# Patient Record
Sex: Female | Born: 1945 | Race: White | Hispanic: No | Smoking: Former smoker
Health system: Southern US, Community
[De-identification: ages and names within clinical notes are randomized; demographics above are authoritative.]

## PROBLEM LIST (undated history)

## (undated) DIAGNOSIS — M5135 Other intervertebral disc degeneration, thoracolumbar region: Secondary | ICD-10-CM

## (undated) DIAGNOSIS — G8929 Other chronic pain: Secondary | ICD-10-CM

## (undated) DIAGNOSIS — E1142 Type 2 diabetes mellitus with diabetic polyneuropathy: Secondary | ICD-10-CM

## (undated) DIAGNOSIS — E119 Type 2 diabetes mellitus without complications: Secondary | ICD-10-CM

## (undated) DIAGNOSIS — M5137 Other intervertebral disc degeneration, lumbosacral region: Secondary | ICD-10-CM

## (undated) DIAGNOSIS — T8859XA Other complications of anesthesia, initial encounter: Secondary | ICD-10-CM

## (undated) DIAGNOSIS — C50919 Malignant neoplasm of unspecified site of unspecified female breast: Secondary | ICD-10-CM

## (undated) DIAGNOSIS — M545 Low back pain, unspecified: Secondary | ICD-10-CM

## (undated) DIAGNOSIS — C50911 Malignant neoplasm of unspecified site of right female breast: Secondary | ICD-10-CM

## (undated) DIAGNOSIS — E785 Hyperlipidemia, unspecified: Secondary | ICD-10-CM

## (undated) DIAGNOSIS — M51379 Other intervertebral disc degeneration, lumbosacral region without mention of lumbar back pain or lower extremity pain: Secondary | ICD-10-CM

## (undated) DIAGNOSIS — R011 Cardiac murmur, unspecified: Secondary | ICD-10-CM

## (undated) DIAGNOSIS — R51 Headache: Secondary | ICD-10-CM

## (undated) DIAGNOSIS — I340 Nonrheumatic mitral (valve) insufficiency: Secondary | ICD-10-CM

## (undated) DIAGNOSIS — M5030 Other cervical disc degeneration, unspecified cervical region: Secondary | ICD-10-CM

## (undated) DIAGNOSIS — I10 Essential (primary) hypertension: Secondary | ICD-10-CM

## (undated) DIAGNOSIS — G709 Myoneural disorder, unspecified: Secondary | ICD-10-CM

## (undated) DIAGNOSIS — M1990 Unspecified osteoarthritis, unspecified site: Secondary | ICD-10-CM

## (undated) DIAGNOSIS — R519 Headache, unspecified: Secondary | ICD-10-CM

## (undated) DIAGNOSIS — T4145XA Adverse effect of unspecified anesthetic, initial encounter: Secondary | ICD-10-CM

## (undated) HISTORY — PX: TOTAL KNEE ARTHROPLASTY: SHX125

## (undated) HISTORY — PX: JOINT REPLACEMENT: SHX530

---
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
## 1978-05-07 HISTORY — PX: TUBAL LIGATION: SHX77

## 1998-06-22 ENCOUNTER — Encounter: Admission: RE | Admit: 1998-06-22 | Discharge: 1998-07-12 | Payer: Self-pay | Admitting: Orthopedic Surgery

## 2001-12-12 ENCOUNTER — Ambulatory Visit (HOSPITAL_COMMUNITY): Admission: RE | Admit: 2001-12-12 | Discharge: 2001-12-12 | Payer: Self-pay | Admitting: Neurology

## 2001-12-12 ENCOUNTER — Encounter: Payer: Self-pay | Admitting: Neurology

## 2005-11-15 ENCOUNTER — Emergency Department (HOSPITAL_COMMUNITY): Admission: EM | Admit: 2005-11-15 | Discharge: 2005-11-16 | Payer: Self-pay | Admitting: Emergency Medicine

## 2008-02-09 ENCOUNTER — Inpatient Hospital Stay (HOSPITAL_COMMUNITY): Admission: RE | Admit: 2008-02-09 | Discharge: 2008-02-11 | Payer: Self-pay | Admitting: Orthopedic Surgery

## 2008-03-03 ENCOUNTER — Encounter: Admission: RE | Admit: 2008-03-03 | Discharge: 2008-05-04 | Payer: Self-pay | Admitting: Orthopedic Surgery

## 2010-08-01 ENCOUNTER — Inpatient Hospital Stay (HOSPITAL_COMMUNITY)
Admission: EM | Admit: 2010-08-01 | Discharge: 2010-08-03 | DRG: 314 | Disposition: A | Discharge status: Home / Self Care | Payer: 59 | Attending: Pulmonary Disease | Admitting: Pulmonary Disease

## 2010-08-01 ENCOUNTER — Emergency Department (HOSPITAL_COMMUNITY): Payer: 59

## 2010-08-01 DIAGNOSIS — R197 Diarrhea, unspecified: Secondary | ICD-10-CM

## 2010-08-01 DIAGNOSIS — A059 Bacterial foodborne intoxication, unspecified: Secondary | ICD-10-CM

## 2010-08-01 DIAGNOSIS — R579 Shock, unspecified: Secondary | ICD-10-CM

## 2010-08-01 DIAGNOSIS — R578 Other shock: Secondary | ICD-10-CM | POA: Diagnosis present

## 2010-08-01 DIAGNOSIS — R112 Nausea with vomiting, unspecified: Secondary | ICD-10-CM | POA: Diagnosis present

## 2010-08-01 DIAGNOSIS — I959 Hypotension, unspecified: Principal | ICD-10-CM | POA: Diagnosis present

## 2010-08-01 DIAGNOSIS — E785 Hyperlipidemia, unspecified: Secondary | ICD-10-CM | POA: Diagnosis present

## 2010-08-01 DIAGNOSIS — E119 Type 2 diabetes mellitus without complications: Secondary | ICD-10-CM | POA: Diagnosis present

## 2010-08-01 LAB — BASIC METABOLIC PANEL
BUN: 20 mg/dL (ref 6–23)
Calcium: 8.7 mg/dL (ref 8.4–10.5)
Creatinine, Ser: 1.19 mg/dL (ref 0.4–1.2)
GFR calc non Af Amer: 46 mL/min — ABNORMAL LOW (ref >60)
Glucose, Bld: 129 mg/dL — ABNORMAL HIGH (ref 70–99)
Potassium: 4.5 mEq/L (ref 3.5–5.1)

## 2010-08-01 LAB — PROTIME-INR: INR: 0.94 (ref 0.00–1.49)

## 2010-08-01 LAB — URINALYSIS, MICROSCOPIC ONLY
Glucose, UA: NEGATIVE mg/dL
Ketones, ur: NEGATIVE mg/dL
Leukocytes, UA: NEGATIVE
Nitrite: NEGATIVE
Specific Gravity, Urine: 1.018 (ref 1.005–1.030)
pH: 5 (ref 5.0–8.0)

## 2010-08-01 LAB — COMPREHENSIVE METABOLIC PANEL
AST: 27 U/L (ref 0–37)
Albumin: 4.1 g/dL (ref 3.5–5.2)
Alkaline Phosphatase: 79 U/L (ref 39–117)
CO2: 21 mEq/L (ref 19–32)
Chloride: 107 mEq/L (ref 96–112)
Creatinine, Ser: 1.33 mg/dL — ABNORMAL HIGH (ref 0.4–1.2)
GFR calc Af Amer: 49 mL/min — ABNORMAL LOW (ref >60)
GFR calc non Af Amer: 40 mL/min — ABNORMAL LOW (ref >60)
Glucose, Bld: 143 mg/dL — ABNORMAL HIGH (ref 70–99)
Potassium: 4.6 mEq/L (ref 3.5–5.1)
Sodium: 140 mEq/L (ref 135–145)
Total Bilirubin: 0.3 mg/dL (ref 0.3–1.2)
Total Protein: 7.3 g/dL (ref 6.0–8.3)

## 2010-08-01 LAB — CBC
HCT: 41.9 % (ref 36.0–46.0)
MCH: 27 pg (ref 26.0–34.0)
MCHC: 32.5 g/dL (ref 30.0–36.0)
Platelets: 271 10*3/uL (ref 150–400)
RBC: 5.03 MIL/uL (ref 3.87–5.11)
RDW: 15.4 % (ref 11.5–15.5)
WBC: 13.6 10*3/uL — ABNORMAL HIGH (ref 4.0–10.5)

## 2010-08-01 LAB — DIFFERENTIAL
Basophils Relative: 0 % (ref 0–1)
Eosinophils Absolute: 0.2 10*3/uL (ref 0.0–0.7)
Eosinophils Relative: 1 % (ref 0–5)
Lymphocytes Relative: 6 % — ABNORMAL LOW (ref 12–46)
Monocytes Absolute: 1.8 10*3/uL — ABNORMAL HIGH (ref 0.1–1.0)
Monocytes Relative: 13 % — ABNORMAL HIGH (ref 3–12)
Neutrophils Relative %: 80 % — ABNORMAL HIGH (ref 43–77)

## 2010-08-01 LAB — BRAIN NATRIURETIC PEPTIDE: Pro B Natriuretic peptide (BNP): 46 pg/mL (ref 0.0–100.0)

## 2010-08-01 LAB — HEPATIC FUNCTION PANEL
ALT: 20 U/L (ref 0–35)
AST: 26 U/L (ref 0–37)
Bilirubin, Direct: <0.1 mg/dL (ref 0.0–0.3)
Total Bilirubin: 0.4 mg/dL (ref 0.3–1.2)

## 2010-08-01 LAB — GLUCOSE, CAPILLARY: Glucose-Capillary: 155 mg/dL — ABNORMAL HIGH (ref 70–99)

## 2010-08-01 LAB — CK TOTAL AND CKMB (NOT AT ARMC): Total CK: 392 U/L — ABNORMAL HIGH (ref 7–177)

## 2010-08-01 LAB — LIPASE, BLOOD: Lipase: 22 U/L (ref 11–59)

## 2010-08-02 LAB — CARDIAC PANEL(CRET KIN+CKTOT+MB+TROPI)
Relative Index: 1.8 (ref 0.0–2.5)
Total CK: 313 U/L — ABNORMAL HIGH (ref 7–177)
Troponin I: 0.01 ng/mL (ref 0.00–0.06)

## 2010-08-02 LAB — GLUCOSE, CAPILLARY
Glucose-Capillary: 153 mg/dL — ABNORMAL HIGH (ref 70–99)
Glucose-Capillary: 178 mg/dL — ABNORMAL HIGH (ref 70–99)
Glucose-Capillary: 162 mg/dL — ABNORMAL HIGH (ref 70–99)

## 2010-08-02 LAB — CBC
Hemoglobin: 11.4 g/dL — ABNORMAL LOW (ref 12.0–15.0)
MCV: 83.2 fL (ref 78.0–100.0)
Platelets: 268 10*3/uL (ref 150–400)
RBC: 4.22 MIL/uL (ref 3.87–5.11)
WBC: 8.9 10*3/uL (ref 4.0–10.5)

## 2010-08-02 LAB — URINE CULTURE
Culture  Setup Time: 201203272150
Culture: NO GROWTH
Special Requests: NEGATIVE

## 2010-08-02 LAB — BASIC METABOLIC PANEL
BUN: 14 mg/dL (ref 6–23)
CO2: 25 mEq/L (ref 19–32)
Chloride: 110 mEq/L (ref 96–112)
Potassium: 4 mEq/L (ref 3.5–5.1)

## 2010-08-02 LAB — CLOSTRIDIUM DIFFICILE BY PCR: Toxigenic C. Difficile by PCR: NEGATIVE

## 2010-08-02 LAB — GIARDIA/CRYPTOSPORIDIUM SCREEN(EIA)
Cryptosporidium Screen (EIA): NEGATIVE
Giardia Screen - EIA: NEGATIVE

## 2010-08-03 LAB — BASIC METABOLIC PANEL
BUN: 11 mg/dL (ref 6–23)
CO2: 25 mEq/L (ref 19–32)
Chloride: 107 mEq/L (ref 96–112)
Glucose, Bld: 162 mg/dL — ABNORMAL HIGH (ref 70–99)
Potassium: 4 mEq/L (ref 3.5–5.1)
Sodium: 141 mEq/L (ref 135–145)

## 2010-08-03 LAB — CBC
HCT: 34.1 % — ABNORMAL LOW (ref 36.0–46.0)
Hemoglobin: 10.8 g/dL — ABNORMAL LOW (ref 12.0–15.0)
MCV: 83.8 fL (ref 78.0–100.0)
WBC: 6.3 10*3/uL (ref 4.0–10.5)

## 2010-08-03 LAB — GLUCOSE, CAPILLARY: Glucose-Capillary: 173 mg/dL — ABNORMAL HIGH (ref 70–99)

## 2010-08-05 LAB — STOOL CULTURE

## 2010-08-08 LAB — CULTURE, BLOOD (ROUTINE X 2)
Culture  Setup Time: 201203280111
Culture: NO GROWTH

## 2010-08-28 NOTE — H&P (Signed)
NAME:  Tonya Alvarez, Tonya Alvarez NO.:  [id number]  MEDICAL RECORD NO.:  [id number]           PATIENT TYPE:  I  LOCATION:  2901                         FACILITY:  [facility]  PHYSICIAN:  Felipa Evener, MD  DATE OF BIRTH:  04-14-1946  DATE OF ADMISSION:  08/01/2010 DATE OF DISCHARGE:                             HISTORY & PHYSICAL   IDENTIFICATION:  A 65 year old female with past medical history significant for well-controlled diabetes with metformin and insulin who presents to the hospital with a chief complaint of nausea and vomiting. The patient was working in the intensive care unit and after eating, preordered [place] food, the patient reported nausea, vomiting, approximately 1 hour after eating that progressed to diarrhea and then the patient was dizzy.  She was asked to sit down and blood pressure was checked at that time that showed a blood pressure systolic of 70 with a heart rate of 80-90.  The patient was asked to lay down and was given 1 L of normal saline with blood pressures stabilizing in the 50s.  In the emergency department, the patient was given additional IV fluids with a plan for control of nausea and vomiting.  Pulmonary Critical Care was called to admit since the patient is hypotensive.  Upon evaluating the patient, the patient received a total of 1-1/2 L of normal saline. __________ remained in the 50s with no fevers or chills, but did report nausea, vomiting, and diarrhea, none of which were bloody or melanous. The patient is able to depress her airway well and was requiring minimal amount of oxygen.  Denied any abdominal pain, fever, chills, chest pains, or any additional symptoms.  Her past medical history is significant for non-insulin-dependent diabetes x8 years, has been using metformin and insulin, metformin 1000 p.o. b.i.d. and insulin 80/20, 60 units in the morning, 70 units at night, 81 mg aspirin daily, MDI daily, vitamin D daily, and Benicar  for which the patient does not recall the dose.  ALLERGIES:  ACE INHIBITORS resulting in cough.  SOCIAL HISTORY:  One-half pack per day smoker for past 40 years.  No alcohol.  No drug abuse.  She works as Sales executive.  HISTORY OF PRESENT ILLNESS:  The patient has had similar symptoms approximately 3 weeks ago after eating out.  PAST SURGICAL HISTORY:  Left knee replacement 2-1/2 years ago, tubal ligation, and tonsillectomy as a child.  REVIEW OF SYSTEMS:  Twelve-point review of systems is negative other than mentioned above.  PHYSICAL EXAMINATION:  GENERAL:  A well-appearing obese female resting comfortably in bed, in no acute distress. VITAL SIGNS:  She is afebrile.  Temperature 98.3, heart rate of 82, respiratory rate of 18, blood pressure of 90/40, and saturation of 100% of 3 L via nasal cannula. HEENT:  Normocephalic, atraumatic.  Pupils are equal, round, and reactive to light.  Extraocular movements are intact.  Oronasal mucosa is within normal limits. NECK:  No thyromegaly, lymphadenopathy, or jugular venous reflux appreciated.  However, the neck is obese and it is difficult to ascertain vessels. HEART:  Regular rate and rhythm.  Normal S1 and S2.  No murmurs, rubs, or gallops are appreciated. LUNGS:  Clear to auscultation bilaterally. ABDOMEN:  Soft, nontender, and nondistended.  Positive bowel sounds. EXTREMITIES:  No edema.  No tenderness appreciated. NEUROLOGIC:  Grossly intact.  LABORATORY STUDIES:  All reviewed.  However, only a CBC was available at this time with a white blood cell count of 13.6, hemoglobin of 13.6, hematocrit 41.9, platelet count of 271.  ASSESSMENT AND PLAN:  The patient is a 65 year old female with a past medical history significant for type 2 diabetes that is well-controlled with the last hemoglobin A1c of 6.8.  The patient is presenting with shock, nausea, vomiting, and diarrhea indicating mostly it is hypovolemic or septic  shock though only unusual part is that the patient is not tachycardic.  She is not on any beta-blocker or calcium channel blocker or any medication for rate control pointing the concern for cardiogenic shock as a possible culprit as well.  Therefore, at this point, we will admit the patient to step-down unit.  We will start her on aspirin, hold any beta-blockers.  Her EKG will be checked.  Cardiac enzymes will also be checked.  With regards to diarrhea, nausea, vomiting, we will use Zofran on a p.r.n. basis.  Hold Imodium given the fact that the patient might have infective rather than toxic diarrhea. We will panculture the patient, IV fluid will be ordered.  EKG with cardiac enzymes for question of cardiogenic shock will be ordered. Cipro and Flagyl IV per pharmacy.  We will allow for clear liquid diet as long as nausea and vomiting is under control, we will admit the patient to step-down unit.  All labs are still pending at this point. Therefore, we will follow up on labs.  We will titrate O2 for saturation of 88-92% and we will follow upon arrival to the step-down unit.     Felipa Evener, MD     WJY/MEDQ  D:  08/01/2010  T:  08/02/2010  Job:  [id number]  Electronically Signed by Koren Bound MD on 08/28/2010 10:39:54 AM

## 2010-08-28 NOTE — Discharge Summary (Signed)
NAMESEDONA, Tonya Alvarez                ACCOUNT NO.:  [id number]  MEDICAL RECORD NO.:  [id number]           PATIENT TYPE:  I  LOCATION:  2901                         FACILITY:  [facility]  PHYSICIAN:  Felipa Evener, MD  DATE OF BIRTH:  [birth year]  DATE OF ADMISSION:  08/01/2010 DATE OF DISCHARGE:  08/03/2010                              DISCHARGE SUMMARY   DISCHARGE DIAGNOSES: 1. Hypotension/hypovolemic shock. 2. Acute renal insufficiency. 3. Nausea, vomiting, diarrhea. 4. Diabetes.  HISTORY OF PRESENT ILLNESS:  Ms. 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  The patient felt dizzy and ultimately had a syncopal episode while at work, and was found to have a blood pressure of 70 systolic and Pulmonary Critical Care was called to admit the patient secondary to hypotension and hypovolemic shock.  LABORATORY DATA:  At the time of admission on March 27, CBC, white blood cells 13.6, hemoglobin 13.6, hematocrit 41.9, and platelets 271. Complete metabolic panel, sodium 140, potassium 4.6, glucose 143, BUN 19, creatinine 1.33.  Lactic acid 1.6, INR 0.94, and troponin 0.01.  BNP 46.  On March 28, basic metabolic panel, sodium 141, potassium 4.0, glucose of 127, BUN 14, creatinine 0.82.  Most recent laboratory data on March 29, basic metabolic panel, sodium 141, potassium 4.0, glucose 162, BUN 11, creatinine 0.71.  CBC, white blood cells 6.3, hemoglobin 10.8, hematocrit 34.1, and platelets 229.  MICRO DATA:  Blood cultures x2 on March 27, preliminary report is no growth.  Urine culture on March 27, final report no growth.  Stool culture on March 27, preliminary report is most suspicious colonies and C.diff PCR on March 7 was negative.  RADIOLOGY DATA:  Portable chest x-ray on March 27 shows no  acute cardiopulmonary process.  HOSPITAL COURSE BY DISCHARGE DIAGNOSES: 1. Hypotension/hypovolemic shock secondary to nausea, vomiting,     diarrhea, questionable food positioning after eating and pick out     [place] food.  The patient was initially significantly hypotensive     with systolic blood pressure in the 60s and 70s, however, with     aggressive volume resuscitation her blood pressures returned to     normal.  At the time of discharge, the patient no longer receiving     IV fluids.  Her blood pressures are within normal limits and the     patient is tolerating regular diet. 2. Acute renal insufficiency.  This in the setting of hypokalemia,     peak creatinine 1.19.  The patient is on an ARB at baseline.  This     was held and with aggressive IV fluid resuscitation, the patient's     renal function has returned to normal.  She will be restarted on     her regular home medications at discharge. 3. Nausea, vomiting, and diarrhea.  All cultures were negative.     Question whether this was an acute food positioning.  At the time  of discharge, her nausea, vomiting and diarrhea are resolved.  She     has been tolerating a p.o. diet without any difficulties and has     not needed any antiemetics.  She will be discharged with p.r.n.     Zofran as needed. 4. Diabetes.  The patient was treated with ICU hyperglycemia protocol     while she was inpatient.  She will be discharged on her home     insulin and metformin regimen.  DISCHARGE MEDICATIONS: 1. Zofran 4 mg p.o. q.6 h. p.r.n. for nausea. 2. Benicar 10 mg p.o. daily. 3. Humalog 80/20 60 units subcu in the morning and 70 units subqu at     night.  This was the patient's previous home regimen. 4. Ibuprofen 200 mg p.o. q.6 h. p.r.n. for pain. 5. Metformin XR 1000 mg p.o. b.i.d. 6. Multivitamins 1 tablet daily 7. Vitamin D 1 tablet p.o. daily.  DISCHARGE ACTIVITY:  No restrictions.  DISCHARGE DIET:  The patient is discharged  on a diabetic diet.  DISCHARGE FOLLOWUP:  The patient is to follow up with her primary care/endocrinologist, Dr. Talmage Nap, [facility] as previously scheduled on April 9 at 8:15  DISPOSITION:  The patient has received maximum benefit from her inpatient hospitalization.  Her hypovolemic shock quickly resolved with IV fluids. She has had no further nausea, vomiting, and diarrhea and will be discharged home on her regular medication regimen.     Dirk Dress, NP   ______________________________ Felipa Evener, MD    KW/MEDQ  D:  08/03/2010  T:  08/04/2010  Job:  [id number]  cc:   Dorisann Frames, M.D.  Electronically Signed by Danford Bad N.P. on 08/07/2010 11:20:56 AM Electronically Signed by Koren Bound MD on 08/28/2010 10:39:50 AM

## 2010-09-08 ENCOUNTER — Emergency Department (HOSPITAL_COMMUNITY)
Admission: EM | Admit: 2010-09-08 | Discharge: 2010-09-08 | Disposition: A | Discharge status: Home / Self Care | Payer: 59 | Attending: Emergency Medicine | Admitting: Emergency Medicine

## 2010-09-08 ENCOUNTER — Emergency Department (HOSPITAL_COMMUNITY): Payer: 59

## 2010-09-08 DIAGNOSIS — E119 Type 2 diabetes mellitus without complications: Secondary | ICD-10-CM | POA: Insufficient documentation

## 2010-09-08 DIAGNOSIS — W010XXA Fall on same level from slipping, tripping and stumbling without subsequent striking against object, initial encounter: Secondary | ICD-10-CM | POA: Insufficient documentation

## 2010-09-08 DIAGNOSIS — Z794 Long term (current) use of insulin: Secondary | ICD-10-CM | POA: Insufficient documentation

## 2010-09-08 DIAGNOSIS — S53106A Unspecified dislocation of unspecified ulnohumeral joint, initial encounter: Secondary | ICD-10-CM | POA: Insufficient documentation

## 2010-09-08 DIAGNOSIS — S8000XA Contusion of unspecified knee, initial encounter: Secondary | ICD-10-CM | POA: Insufficient documentation

## 2010-09-08 DIAGNOSIS — Y92009 Unspecified place in unspecified non-institutional (private) residence as the place of occurrence of the external cause: Secondary | ICD-10-CM | POA: Insufficient documentation

## 2010-09-19 NOTE — Op Note (Signed)
Tonya Alvarez NO.:  [id number]   MEDICAL RECORD NO.:  [id number]          PATIENT TYPE:  INP   LOCATION:  2899                         FACILITY:  [facility]   PHYSICIAN:  Elana Alm. Thurston Hole, M.D. DATE OF BIRTH:  [birth year]   DATE OF PROCEDURE:  02/09/2008  DATE OF DISCHARGE:                               OPERATIVE REPORT   PREOPERATIVE DIAGNOSIS:  Left knee degenerative joint disease.   POSTOPERATIVE DIAGNOSIS:  Left knee degenerative joint disease.   PROCEDURE:  Left total knee replacement using DePuy cemented total knee  system with #3 cemented femur, #3 cemented tibia with 15-mm polyethylene  RP tibial spacer and 35-mm polyethylene cemented patella.   SURGEON:  Elana Alm. Thurston Hole, MD   ASSISTANT:  Julien Girt, PA   ANESTHESIA:  General.   OPERATIVE TIME:  1 hour 30 minutes.   COMPLICATIONS:  None.   DESCRIPTION OF PROCEDURE:  [name] was brought to the operating room  on February 09, 2008, after a femoral nerve block was placed on him by  Anesthesia.  She received Ancef 2 grams IV preoperatively for  prophylaxis.  After being placed under general anesthesia, she had a  Foley catheter placed under sterile conditions.  Her left knee was  examined.  The range of motion from -5 to 125 degrees, moderate varus  deformity, knee stable with ligamentous exam with normal patellar  tracking.  Left leg was prepped using sterile DuraPrep and draped using  sterile technique.  Leg was exsanguinated and a thigh tourniquet  elevated 375 mm.  Initially through a 15 cm longitudinal incision based  over the patella, an initial exposure was made.  The underlying  subcutaneous tissues were incised along with skin incision.  A median  arthrotomy was performed revealing excessive amount of normal-appearing  joint fluid.  The articular surfaces were inspected.  She had grade 4  changes medially, grade 3 and 4 changes laterally, and grade 3 and 4  changes in the  patellofemoral joint.  Osteophytes removed from the  femoral condyles and tibial plateau.  The medial and lateral meniscal  remnants were removed as well as the anterior cruciate ligament.  An  intramedullary drill was then drilled up the femoral canal for placement  of distal femoral cutting jig and it was placed in the appropriate  manner of rotation and a distal 14 mm cut was made.  The distal femur  was incised.  The #3 was found be the appropriate size.  The #3 cutting  jig was placed in the appropriate manner of external rotation and then  these cuts were made.  At this point, the proximal tibia was exposed.  The tibial spines were removed with an oscillating saw.  An  intramedullary drill was drilled down the tibial canal for placement of  proximal tibial cutting jig, which was placed in the appropriate  rotation and a proximal 6-mm cut was made based off the medial or lower  side.  Spacer blocks were then placed in flexion and extension.  The  12.5 mm blocks gave excellent balancing, excellent stability and  excellent correction of her flexion and varus deformities.  At this  point, the #3 tibial base plate trial was placed on the cut tibial  surface with an excellent fit and the keel cut was made.  Then #3 PCL  box cutter was then placed on the distal femur and these cuts were made.  After this was done, a #3 femoral trial was placed with the #3 tibial  base plate trial and a 15-mm polyethylene RP tibial spacer, knee was  reduced, taken through a range of motion from 0 to 125 degrees with  excellent stability and excellent correction of flexion and varus  deformities, and normal patellar tracking.  Resurfacing 9 mm cut was  then made on the patella and 3 locking holes placed for a 35-mm patella  trial.  The trial was placed and again patellofemoral tracking was  evaluated and found to be normal.  At this point, it was felt that all  the trial components were of excellent size,  fit, and stability.  They  were then removed.  The knee was then jet lavage irrigated with 3 liters  of saline.  The proximal tibia was then exposed and the #3 tibial base  plate with cement backing was hammered into position with an excellent  fit with excess cement being removed from around the edges.  The #3  femoral component with cement backing was hammered in to position also  with an excellent fit with excess cement being removed from around the  edges.  The 15-mm polyethylene RP tibial spacer was then placed on the  tibial base plate and the knee reduced, taken through a full range of  motion from 0 to 125 degrees with excellent stability and excellent  correction of her flexion and varus deformities.  The 35-mm polyethylene  cement backed patella was then placed in each position and held there  with a clamp.  After the cement hardened, again patellofemoral tracking  was evaluated and found to be normal.  At this point, it was felt that  all the components were of excellent size, fit, and stability.  The knee  was further irrigated with saline.  Tourniquet was then released.  Hemostasis obtained with cautery and then the arthrotomy was closed with  #1 Ethibond suture over two medium Hemovac drains.  Subcutaneous tissues  closed with 0 and 2-0 Vicryl, subcuticular layer closed with 4-0  Monocryl.  Sterile dressings and a long-leg splint applied.  The patient  then awakened, extubated, and taken to recovery room in stable  condition.  Needle and sponge counts were correct x2 at the end the  case.  Neurovascular status normal as well.      Robert A. Thurston Hole, M.D.  Electronically Signed     RAW/MEDQ  D:  02/09/2008  T:  02/10/2008  Job:  [id number]

## 2010-10-11 ENCOUNTER — Ambulatory Visit: Payer: 59 | Attending: Orthopedic Surgery | Admitting: Physical Therapy

## 2010-10-11 DIAGNOSIS — M6281 Muscle weakness (generalized): Secondary | ICD-10-CM | POA: Insufficient documentation

## 2010-10-11 DIAGNOSIS — M2560 Stiffness of unspecified joint, not elsewhere classified: Secondary | ICD-10-CM | POA: Insufficient documentation

## 2010-10-11 DIAGNOSIS — IMO0001 Reserved for inherently not codable concepts without codable children: Secondary | ICD-10-CM | POA: Insufficient documentation

## 2010-10-11 DIAGNOSIS — M25539 Pain in unspecified wrist: Secondary | ICD-10-CM | POA: Insufficient documentation

## 2010-10-17 ENCOUNTER — Ambulatory Visit: Payer: 59 | Admitting: Physical Therapy

## 2010-10-19 ENCOUNTER — Ambulatory Visit: Payer: 59 | Admitting: Physical Therapy

## 2010-10-23 ENCOUNTER — Ambulatory Visit: Payer: 59 | Admitting: Physical Therapy

## 2010-10-25 ENCOUNTER — Ambulatory Visit: Payer: 59 | Admitting: Physical Therapy

## 2010-11-01 ENCOUNTER — Ambulatory Visit: Payer: 59 | Admitting: Physical Therapy

## 2010-11-03 ENCOUNTER — Ambulatory Visit: Payer: 59 | Admitting: Physical Therapy

## 2010-11-07 ENCOUNTER — Ambulatory Visit: Payer: 59 | Attending: Orthopedic Surgery | Admitting: Physical Therapy

## 2010-11-07 DIAGNOSIS — M6281 Muscle weakness (generalized): Secondary | ICD-10-CM | POA: Insufficient documentation

## 2010-11-07 DIAGNOSIS — IMO0001 Reserved for inherently not codable concepts without codable children: Secondary | ICD-10-CM | POA: Insufficient documentation

## 2010-11-07 DIAGNOSIS — M25539 Pain in unspecified wrist: Secondary | ICD-10-CM | POA: Insufficient documentation

## 2010-11-07 DIAGNOSIS — M2560 Stiffness of unspecified joint, not elsewhere classified: Secondary | ICD-10-CM | POA: Insufficient documentation

## 2010-11-10 ENCOUNTER — Ambulatory Visit: Payer: 59 | Admitting: Physical Therapy

## 2011-02-05 LAB — PROTIME-INR
INR: 0.9
Prothrombin Time: 12.3

## 2011-02-05 LAB — URINALYSIS, ROUTINE W REFLEX MICROSCOPIC
Bilirubin Urine: NEGATIVE
Hgb urine dipstick: NEGATIVE
Protein, ur: NEGATIVE
Urobilinogen, UA: 0.2

## 2011-02-05 LAB — CBC
HCT: 39.7
Hemoglobin: 13.1
MCHC: 33
MCV: 83.9
RBC: 4.73

## 2011-02-05 LAB — COMPREHENSIVE METABOLIC PANEL
ALT: 15
BUN: 10
CO2: 26
Calcium: 9.6
GFR calc non Af Amer: >60
Glucose, Bld: 102 — ABNORMAL HIGH
Sodium: 137

## 2011-02-05 LAB — DIFFERENTIAL
Basophils Relative: 1
Eosinophils Absolute: 0.2
Lymphs Abs: 2.3
Neutrophils Relative %: 64

## 2011-02-05 LAB — URINE CULTURE

## 2011-02-06 LAB — BASIC METABOLIC PANEL
CO2: 28
CO2: 28
Calcium: 8.8
Chloride: 97
Creatinine, Ser: 0.53
GFR calc Af Amer: >60
GFR calc Af Amer: >60
Sodium: 133 — ABNORMAL LOW

## 2011-02-06 LAB — CBC
Hemoglobin: 11.4 — ABNORMAL LOW
MCHC: 33.3
MCHC: 33.7
MCV: 82
RBC: 3.99
RBC: 4.17
WBC: 10.1

## 2011-02-06 LAB — GLUCOSE, CAPILLARY
Glucose-Capillary: 170 — ABNORMAL HIGH
Glucose-Capillary: 191 — ABNORMAL HIGH
Glucose-Capillary: 263 — ABNORMAL HIGH

## 2011-02-06 LAB — TYPE AND SCREEN: Antibody Screen: NEGATIVE

## 2011-02-06 LAB — PROTIME-INR
INR: 1.4
Prothrombin Time: 17.3 — ABNORMAL HIGH

## 2011-02-06 LAB — HEMOGLOBIN A1C: Hgb A1c MFr Bld: 7.3 — ABNORMAL HIGH

## 2014-02-02 ENCOUNTER — Other Ambulatory Visit: Payer: Self-pay

## 2014-02-02 ENCOUNTER — Other Ambulatory Visit: Payer: Self-pay | Admitting: Endocrinology

## 2014-02-02 DIAGNOSIS — N63 Unspecified lump in unspecified breast: Secondary | ICD-10-CM

## 2014-02-04 DIAGNOSIS — C50919 Malignant neoplasm of unspecified site of unspecified female breast: Secondary | ICD-10-CM

## 2014-02-04 HISTORY — PX: BREAST BIOPSY: SHX20

## 2014-02-04 HISTORY — DX: Malignant neoplasm of unspecified site of unspecified female breast: C50.919

## 2014-02-05 ENCOUNTER — Other Ambulatory Visit: Payer: Self-pay | Admitting: Radiology

## 2014-02-05 ENCOUNTER — Other Ambulatory Visit: Payer: 59

## 2014-02-05 DIAGNOSIS — D0511 Intraductal carcinoma in situ of right breast: Secondary | ICD-10-CM

## 2014-02-05 DIAGNOSIS — C50911 Malignant neoplasm of unspecified site of right female breast: Secondary | ICD-10-CM

## 2014-02-08 ENCOUNTER — Telehealth (INDEPENDENT_AMBULATORY_CARE_PROVIDER_SITE_OTHER): Payer: Self-pay

## 2014-02-08 ENCOUNTER — Telehealth: Payer: Self-pay | Admitting: *Deleted

## 2014-02-08 DIAGNOSIS — C50911 Malignant neoplasm of unspecified site of right female breast: Secondary | ICD-10-CM

## 2014-02-08 NOTE — Telephone Encounter (Signed)
Pt seen today by Dr Ninfa Linden for new dx of right breast cancer. Orders placed in epic for URGENT referral to medical oncology. Called pathology to add pt to breast cancer conf. For10/14 since breast mri not till 10/9.

## 2014-02-08 NOTE — Telephone Encounter (Signed)
Received referral from [facility].  Obtained appt date and time from Dr. Lindi Adie.  Called pt and confirmed 02/09/14 appt w/ pt.  Unable to mail before appt letter - gave verbal. Unable to mail welcoming packet - gave instructions and directions.  Unable to mail intake form - placed a note for one to be given at time of check in.  Emailed Lars Mage and [place] [facility] to make them aware.  Made a copy of records and placed one in Dr. Geralyn Flash box and took the other to Med [facility] to scan.  Added to spreadsheet.

## 2014-02-09 ENCOUNTER — Ambulatory Visit (HOSPITAL_BASED_OUTPATIENT_CLINIC_OR_DEPARTMENT_OTHER): Payer: Medicare Other | Admitting: Hematology and Oncology

## 2014-02-09 ENCOUNTER — Encounter: Payer: Self-pay | Admitting: Hematology and Oncology

## 2014-02-09 ENCOUNTER — Ambulatory Visit: Payer: Medicare Other

## 2014-02-09 ENCOUNTER — Encounter (INDEPENDENT_AMBULATORY_CARE_PROVIDER_SITE_OTHER): Payer: Self-pay

## 2014-02-09 VITALS — BP 137/63 | HR 78 | Temp 98.3°F | Resp 20 | Ht 69.5 in | Wt 309.8 lb

## 2014-02-09 DIAGNOSIS — C50411 Malignant neoplasm of upper-outer quadrant of right female breast: Secondary | ICD-10-CM | POA: Insufficient documentation

## 2014-02-09 NOTE — Assessment & Plan Note (Signed)
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 We will be presenting her case in the multidisciplinary breast tumor board. I recommended neoadjuvant chemotherapy. I discussed different regimens. If she is HER-2 negative, I recommended dose dense Adriamycin and Cytoxan followed by weekly Abraxane x12 (because patient has neuropathy I would like to use Abraxane instead of Taxol)   Chemotherapy counseling:I have discussed the risks and benefits of chemotherapy including the risks of nausea/ vomiting, risk of infection from low WBC count, fatigue due to chemo or anemia, bruising or bleeding due to low platelets, mouth sores, loss/ change in taste and decreased appetite. Liver and kidney function will be monitored through out chemotherapy as abnormalities in liver and kidney function may be a side effect of treatment.Cardiac dysfunction due to Adriamycin Herceptin and Perjeta were discussed in detail. Herceptin and Perjeta would be considered if she was HER-2 positive.Risk of permanent bone marrow dysfunction and leukemia due to chemo were also discussed.  If patient agrees to neoadjuvant therapy, she will need port placement, chemotherapy counseling with education, echocardiogram. We will need the help of her endocrinologist to manage her diabetes during chemotherapy because of requirement to use Decadron as premedication for 3 days with each cycle of chemotherapy.  Staging: I would like to obtain a PET/CT scan and also we'll order echocardiogram in anticipation of requiring chemotherapy soon.

## 2014-02-09 NOTE — Progress Notes (Signed)
Checked in new pt with no financial concerns at this time.  Pt has Raquel's card for any questions or concerns. ° °

## 2014-02-09 NOTE — Progress Notes (Signed)
ER 1 left knee sounds are [place] CONSULT NOTE  Patient Care Team: Jacelyn Pi, MD as PCP - General (Endocrinology)  CHIEF COMPLAINTS/PURPOSE OF CONSULTATION:  Newly diagnosed breast cancer  HISTORY OF PRESENTING ILLNESS:  Tonya Alvarez 68 y.o. female is here because of recent diagnosis of right breast cancer. Patient had a mammogram on 02/02/2014 which revealed a 5.8 Sinemet a mass in the right breast and she underwent a biopsy at [facility] that confirmed invasive breast cancer. We do not have pathology report at this time but on looking forward to the fax report. She has no prior history of breast problems or any other concerns of the breast prior to this. She does not have any family history of breast cancer either. She was seen by Dr. Ninfa Linden who referred her here for discussion regarding neoadjuvant therapy options. She is also set up to undergo breast MRI.   I reviewed her records extensively and collaborated the history with the patient.  SUMMARY OF ONCOLOGIC HISTORY:   Breast cancer of upper-outer quadrant of right female breast   02/02/2014 Mammogram Right breast: 5.8 cm irregular high-density solid mass suggestive of malignancy    In terms of breast cancer risk profile:  She menarched at early age of 83 and went to menopause at age 48  She had 2 pregnancy, her first child was born at age 48  She has received birth control pills for approximately 5 years.  She was never exposed to fertility medications or hormone replacement therapy.  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  MEDICATIONS:  Current Outpatient Prescriptions  Medication Sig Dispense Refill  . aspirin 81 MG tablet Take 81 mg by mouth daily.      Marland Kitchen ibuprofen (ADVIL,MOTRIN) 600 MG tablet Take 600 mg by mouth every 12 (twelve) hours.      . insulin aspart protamine- aspart (NOVOLOG MIX 70/30) (70-30) 100 UNIT/ML injection Inject 60-68 Units into the skin 2 (two) times daily.      Marland Kitchen losartan (COZAAR) 25 MG tablet Take 25 mg by mouth daily.      . metFORMIN (GLUCOPHAGE) 500 MG tablet Take 500 mg by mouth 2 (two) times daily with a meal.      . simvastatin (ZOCOR) 10 MG tablet Take 10 mg by mouth daily.       No current facility-administered medications for this visit.    REVIEW OF SYSTEMS:   Constitutional: Denies fevers, chills or abnormal night sweats Eyes: Denies blurriness of vision, double vision or watery eyes Ears, nose, mouth, throat, and face: Denies mucositis or sore throat Respiratory: Denies cough, dyspnea or wheezes Cardiovascular: Denies palpitation, chest discomfort or lower extremity swelling Gastrointestinal:  Denies nausea, heartburn or change in bowel habits Skin: Denies abnormal skin rashes Lymphatics: Denies new lymphadenopathy or easy bruising Neurological:Denies numbness, tingling or new weaknesses Behavioral/Psych: Mood is stable, no new changes  Breast: Palpable mass in the right breast Arthritis symptoms in the back All other systems were  reviewed with the patient and are negative.  PHYSICAL EXAMINATION: ECOG PERFORMANCE STATUS: 1 - Symptomatic but completely ambulatory  Filed Vitals:   02/09/14 1601  BP: 137/63  Pulse: 78  Temp: 98.3 F (36.8 C)  Resp: 20   Filed Weights   02/09/14 1601  Weight: 309 lb 12.8 oz (140.524 kg)    GENERAL:alert, no distress and comfortable SKIN: skin color, texture, turgor are normal, no rashes or  significant lesions EYES: normal, conjunctiva are pink and non-injected, sclera clear OROPHARYNX:no exudate, no erythema and lips, buccal mucosa, and tongue normal  NECK: supple, thyroid normal size, non-tender, without nodularity LYMPH:  no palpable lymphadenopathy in the cervical, axillary or inguinal LUNGS: clear to auscultation and percussion with normal breathing effort HEART: regular rate & rhythm and no murmurs and no lower extremity edema ABDOMEN:abdomen soft, non-tender and normal bowel sounds Musculoskeletal:no cyanosis of digits and no clubbing  PSYCH: alert & oriented x 3 with fluent speech NEURO: no focal motor/sensory deficits BREAST: 6 cm palpable mass in the right breast I could not palpate any axillary lymph nodes   LABORATORY DATA:  I have reviewed the data as listed Lab Results  Component Value Date   WBC 6.3 08/03/2010   HGB 10.8* 08/03/2010   HCT 34.1* 08/03/2010   MCV 83.8 08/03/2010   PLT 229 08/03/2010   Lab Results  Component Value Date   NA 141 08/03/2010   K 4.0 08/03/2010   CL 107 08/03/2010   CO2 25 08/03/2010    RADIOGRAPHIC STUDIES: I have personally reviewed the radiological reports and agreed with the findings in the report.  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 We will be presenting her case in the multidisciplinary breast tumor board. I recommended neoadjuvant chemotherapy. I discussed different regimens. If she is HER-2 negative, I recommended dose dense Adriamycin and Cytoxan followed by weekly Abraxane x12 (because patient has neuropathy I  would like to use Abraxane instead of Taxol)   Chemotherapy counseling:I have discussed the risks and benefits of chemotherapy including the risks of nausea/ vomiting, risk of infection from low WBC count, fatigue due to chemo or anemia, bruising or bleeding due to low platelets, mouth sores, loss/ change in taste and decreased appetite. Liver and kidney function will be monitored through out chemotherapy as abnormalities in liver and kidney function may be a side effect of treatment.Cardiac dysfunction due to Adriamycin Herceptin and Perjeta were discussed in detail. Herceptin and Perjeta would be considered if she was HER-2 positive.Risk of permanent bone marrow dysfunction and leukemia due to chemo were also discussed.  If patient agrees to neoadjuvant therapy, she will need port placement, chemotherapy counseling with education, echocardiogram. We will need the help of her endocrinologist to manage her diabetes during chemotherapy because of requirement to use Decadron as premedication for 3 days with each cycle of chemotherapy.  Staging: I would like to obtain a PET/CT scan and also we'll order echocardiogram in anticipation of requiring chemotherapy soon.   All questions were answered. The patient knows to call the clinic with any problems, questions or concerns. I spent 55 minutes counseling the  patient face to face. The total time spent in the appointment was 60 minutes and more than 50% was on counseling.     Rulon Eisenmenger, MD 02/09/2014 5:29 PM

## 2014-02-10 ENCOUNTER — Encounter: Payer: Self-pay | Admitting: *Deleted

## 2014-02-10 ENCOUNTER — Telehealth: Payer: Self-pay | Admitting: Hematology and Oncology

## 2014-02-10 NOTE — Progress Notes (Signed)
Received surgical pathology report from [facility]. Sent to scan. 

## 2014-02-10 NOTE — Progress Notes (Signed)
Note created by Dr. Gudena during office visit. Copy to patient, original to scan. 

## 2014-02-10 NOTE — Telephone Encounter (Signed)
, °

## 2014-02-10 NOTE — Progress Notes (Signed)
Received mammogram results from [facility]. Sent to scan.

## 2014-02-12 ENCOUNTER — Telehealth: Payer: Self-pay | Admitting: Hematology and Oncology

## 2014-02-12 ENCOUNTER — Telehealth: Payer: Self-pay

## 2014-02-12 ENCOUNTER — Ambulatory Visit
Admission: RE | Admit: 2014-02-12 | Discharge: 2014-02-12 | Disposition: A | Discharge status: Home / Self Care | Payer: Medicare Other | Source: Ambulatory Visit | Attending: Radiology | Admitting: Radiology

## 2014-02-12 DIAGNOSIS — D0511 Intraductal carcinoma in situ of right breast: Secondary | ICD-10-CM

## 2014-02-12 DIAGNOSIS — C50911 Malignant neoplasm of unspecified site of right female breast: Secondary | ICD-10-CM

## 2014-02-12 MED ORDER — GADOBENATE DIMEGLUMINE 529 MG/ML IV SOLN
20.0000 mL | Freq: Once | INTRAVENOUS | Status: AC | PRN
  Administered 2014-02-12: 20 mL via INTRAVENOUS

## 2014-02-12 NOTE — Telephone Encounter (Signed)
Returned pt call re: status of port schedule.  Pt would prefer Dr Ninfa Linden place port if he is available.  Let pt know I would check and get back with her.  Pt voiced understanding.  Fax sent to [facility] checking availability.  Sent to scan.

## 2014-02-12 NOTE — Telephone Encounter (Signed)
, °

## 2014-02-16 ENCOUNTER — Other Ambulatory Visit: Payer: Self-pay | Admitting: Radiology

## 2014-02-16 ENCOUNTER — Telehealth: Payer: Self-pay | Admitting: Hematology and Oncology

## 2014-02-16 DIAGNOSIS — R928 Other abnormal and inconclusive findings on diagnostic imaging of breast: Secondary | ICD-10-CM

## 2014-02-16 DIAGNOSIS — C50411 Malignant neoplasm of upper-outer quadrant of right female breast: Secondary | ICD-10-CM

## 2014-02-16 NOTE — Progress Notes (Signed)
Alisha called from [facility] - Dr Ninfa Linden.  Dr. Ninfa Linden is unable to put pt port in.   LMOVM for patient per above.  Order entered for IR port placement.

## 2014-02-16 NOTE — Telephone Encounter (Signed)
Gave apt d/t for Chemo edu 02/19/14. No other orders per 10/12 POF.Proofreader

## 2014-02-17 ENCOUNTER — Other Ambulatory Visit: Payer: Self-pay | Admitting: Hematology and Oncology

## 2014-02-17 ENCOUNTER — Encounter (HOSPITAL_COMMUNITY): Payer: Self-pay

## 2014-02-17 ENCOUNTER — Encounter: Payer: Self-pay | Admitting: Endocrinology

## 2014-02-17 ENCOUNTER — Ambulatory Visit (HOSPITAL_COMMUNITY)
Admission: RE | Admit: 2014-02-17 | Discharge: 2014-02-17 | Disposition: A | Discharge status: Home / Self Care | Payer: Medicare Other | Source: Ambulatory Visit | Attending: Hematology and Oncology | Admitting: Hematology and Oncology

## 2014-02-17 ENCOUNTER — Telehealth: Payer: Self-pay

## 2014-02-17 DIAGNOSIS — I517 Cardiomegaly: Secondary | ICD-10-CM

## 2014-02-17 DIAGNOSIS — C50411 Malignant neoplasm of upper-outer quadrant of right female breast: Secondary | ICD-10-CM

## 2014-02-17 LAB — GLUCOSE, CAPILLARY: GLUCOSE-CAPILLARY: 166 mg/dL — AB (ref 70–99)

## 2014-02-17 MED ORDER — FLUDEOXYGLUCOSE F - 18 (FDG) INJECTION
15.3500 | Freq: Once | INTRAVENOUS | Status: AC | PRN
Start: 2014-02-17 — End: 2014-02-17
  Administered 2014-02-17: 15.35 via INTRAVENOUS

## 2014-02-17 NOTE — Progress Notes (Signed)
  Echocardiogram 2D Echocardiogram has been performed.  Dravon Nott FRANCES 02/17/2014, 10:55 AM

## 2014-02-17 NOTE — Telephone Encounter (Signed)
Let pt know IR would place port - 10/20 at 1030.  NPO after midnight - driver needed.  Pt voiced understanding.

## 2014-02-18 ENCOUNTER — Telehealth: Payer: Self-pay

## 2014-02-18 NOTE — Telephone Encounter (Signed)
Let pt know she would still need to have biopsy.

## 2014-02-18 NOTE — Telephone Encounter (Signed)
LMOVM letting pt know she would still need biopsy.

## 2014-02-18 NOTE — Telephone Encounter (Signed)
Pt called - LMOVM.  Wanted to let dr Lindi Adie know that she does remember an injury to her rt upper arm.  Approximately 1 yr ago she fell and hit her shoulder and right upper arm - partially tore rotator cuff.  Would like to know if this affects need for biospy.  Routed to Dr Lindi Adie.

## 2014-02-19 ENCOUNTER — Other Ambulatory Visit: Payer: Medicare Other

## 2014-02-19 ENCOUNTER — Encounter: Payer: Self-pay | Admitting: Hematology and Oncology

## 2014-02-19 NOTE — Progress Notes (Signed)
No treatment date as of today.

## 2014-02-20 ENCOUNTER — Other Ambulatory Visit: Payer: Self-pay | Admitting: Radiology

## 2014-02-22 ENCOUNTER — Ambulatory Visit: Payer: Medicare Other

## 2014-02-22 ENCOUNTER — Ambulatory Visit
Admission: RE | Admit: 2014-02-22 | Discharge: 2014-02-22 | Disposition: A | Discharge status: Home / Self Care | Payer: Medicare Other | Source: Ambulatory Visit | Attending: Radiology | Admitting: Radiology

## 2014-02-22 ENCOUNTER — Encounter (HOSPITAL_COMMUNITY): Payer: Self-pay | Admitting: Pharmacy Technician

## 2014-02-22 ENCOUNTER — Other Ambulatory Visit: Payer: Self-pay | Admitting: Radiology

## 2014-02-22 DIAGNOSIS — R928 Other abnormal and inconclusive findings on diagnostic imaging of breast: Secondary | ICD-10-CM

## 2014-02-23 ENCOUNTER — Ambulatory Visit (HOSPITAL_COMMUNITY)
Admission: RE | Admit: 2014-02-23 | Discharge: 2014-02-23 | Disposition: A | Discharge status: Home / Self Care | Payer: Medicare Other | Source: Ambulatory Visit | Attending: Hematology and Oncology | Admitting: Hematology and Oncology

## 2014-02-23 ENCOUNTER — Other Ambulatory Visit: Payer: Self-pay | Admitting: Hematology and Oncology

## 2014-02-23 ENCOUNTER — Encounter (HOSPITAL_COMMUNITY): Payer: Self-pay

## 2014-02-23 ENCOUNTER — Other Ambulatory Visit: Payer: Self-pay | Admitting: Radiology

## 2014-02-23 DIAGNOSIS — Z452 Encounter for adjustment and management of vascular access device: Secondary | ICD-10-CM | POA: Diagnosis not present

## 2014-02-23 DIAGNOSIS — Z7952 Long term (current) use of systemic steroids: Secondary | ICD-10-CM | POA: Insufficient documentation

## 2014-02-23 DIAGNOSIS — Z79899 Other long term (current) drug therapy: Secondary | ICD-10-CM | POA: Insufficient documentation

## 2014-02-23 DIAGNOSIS — Z794 Long term (current) use of insulin: Secondary | ICD-10-CM | POA: Insufficient documentation

## 2014-02-23 DIAGNOSIS — C50411 Malignant neoplasm of upper-outer quadrant of right female breast: Secondary | ICD-10-CM

## 2014-02-23 DIAGNOSIS — F17210 Nicotine dependence, cigarettes, uncomplicated: Secondary | ICD-10-CM | POA: Diagnosis not present

## 2014-02-23 DIAGNOSIS — C50911 Malignant neoplasm of unspecified site of right female breast: Secondary | ICD-10-CM | POA: Insufficient documentation

## 2014-02-23 DIAGNOSIS — D0591 Unspecified type of carcinoma in situ of right breast: Secondary | ICD-10-CM | POA: Insufficient documentation

## 2014-02-23 HISTORY — PX: PORTACATH PLACEMENT: SHX2246

## 2014-02-23 HISTORY — DX: Cardiac murmur, unspecified: R01.1

## 2014-02-23 HISTORY — DX: Type 2 diabetes mellitus without complications: E11.9

## 2014-02-23 LAB — BASIC METABOLIC PANEL
Anion gap: 16 — ABNORMAL HIGH (ref 5–15)
BUN: 14 mg/dL (ref 6–23)
CALCIUM: 10.1 mg/dL (ref 8.4–10.5)
CHLORIDE: 99 meq/L (ref 96–112)
CO2: 26 meq/L (ref 19–32)
CREATININE: 0.62 mg/dL (ref 0.50–1.10)
GFR calc non Af Amer: >90 mL/min (ref >90)
Glucose, Bld: 182 mg/dL — ABNORMAL HIGH (ref 70–99)
Potassium: 4.6 mEq/L (ref 3.7–5.3)
Sodium: 141 mEq/L (ref 137–147)

## 2014-02-23 LAB — GLUCOSE, CAPILLARY: Glucose-Capillary: 152 mg/dL — ABNORMAL HIGH (ref 70–99)

## 2014-02-23 LAB — CBC
HEMATOCRIT: 42.2 % (ref 36.0–46.0)
Hemoglobin: 13.5 g/dL (ref 12.0–15.0)
MCH: 27.3 pg (ref 26.0–34.0)
MCHC: 32 g/dL (ref 30.0–36.0)
MCV: 85.4 fL (ref 78.0–100.0)
Platelets: 292 10*3/uL (ref 150–400)
RBC: 4.94 MIL/uL (ref 3.87–5.11)
RDW: 16 % — ABNORMAL HIGH (ref 11.5–15.5)
WBC: 6.9 10*3/uL (ref 4.0–10.5)

## 2014-02-23 LAB — PROTIME-INR
INR: 0.96 (ref 0.00–1.49)
Prothrombin Time: 12.9 seconds (ref 11.6–15.2)

## 2014-02-23 LAB — APTT: aPTT: 28 seconds (ref 24–37)

## 2014-02-23 MED ORDER — MIDAZOLAM HCL 2 MG/2ML IJ SOLN
INTRAMUSCULAR | Status: AC
  Filled 2014-02-23: qty 4

## 2014-02-23 MED ORDER — FENTANYL CITRATE 0.05 MG/ML IJ SOLN
INTRAMUSCULAR | Status: AC | PRN
  Administered 2014-02-23 (×2): 100 ug via INTRAVENOUS

## 2014-02-23 MED ORDER — MIDAZOLAM HCL 2 MG/2ML IJ SOLN
INTRAMUSCULAR | Status: AC | PRN
  Administered 2014-02-23 (×2): 2 mg via INTRAVENOUS

## 2014-02-23 MED ORDER — LIDOCAINE HCL 1 % IJ SOLN
INTRAMUSCULAR | Status: AC
  Filled 2014-02-23: qty 20

## 2014-02-23 MED ORDER — HEPARIN SOD (PORK) LOCK FLUSH 100 UNIT/ML IV SOLN
500.0000 [IU] | Freq: Once | INTRAVENOUS | Status: AC
  Administered 2014-02-23: 500 [IU] via INTRAVENOUS

## 2014-02-23 MED ORDER — VANCOMYCIN HCL 10 G IV SOLR
1500.0000 mg | Freq: Once | INTRAVENOUS | Status: AC
  Administered 2014-02-23: 1500 mg via INTRAVENOUS
  Filled 2014-02-23: qty 1500

## 2014-02-23 MED ORDER — HEPARIN SOD (PORK) LOCK FLUSH 100 UNIT/ML IV SOLN
INTRAVENOUS | Status: AC
  Filled 2014-02-23: qty 5

## 2014-02-23 MED ORDER — SODIUM CHLORIDE 0.9 % IV SOLN
Freq: Once | INTRAVENOUS | Status: AC
  Administered 2014-02-23: 11:00:00 via INTRAVENOUS

## 2014-02-23 MED ORDER — FENTANYL CITRATE 0.05 MG/ML IJ SOLN
INTRAMUSCULAR | Status: AC
  Filled 2014-02-23: qty 4

## 2014-02-23 NOTE — Procedures (Signed)
R IJ Port cathter placement with US and fluoroscopy No complication No blood loss. See complete dictation in [facility] PACS.  

## 2014-02-23 NOTE — H&P (Signed)
Chief Complaint: "I'm getting a port a cath"  Referring Physician(s): Gudena,Vinay K  History of Present Illness: Tonya Alvarez is a 68 y.o. female with history of recently diagnosed right breast carcinoma who presents today for port a cath placement for chemotherapy.  Past Medical History  Diagnosis Date  . Heart murmur     mitral valve  . Diabetes mellitus without complication     Past Surgical History  Procedure Laterality Date  . Joint replacement  left total knee  . Tubal ligation    . Breast biopsy Right   . Tonsillectomy      68yrs old    Allergies: Amoxicillin; Lisinopril; and Oxycodone  Medications: Prior to Admission medications   Medication Sig Start Date End Date Taking? Authorizing Provider  acetaminophen (TYLENOL) 500 MG tablet Take 500 mg by mouth every 6 (six) hours as needed.   Yes Historical Provider, MD  insulin aspart protamine- aspart (NOVOLOG MIX 70/30) (70-30) 100 UNIT/ML injection Inject 60-68 Units into the skin 2 (two) times daily.   Yes Historical Provider, MD  losartan (COZAAR) 50 MG tablet Take 50 mg by mouth every morning.   Yes Historical Provider, MD  metFORMIN (GLUCOPHAGE) 500 MG tablet Take 1,000 mg by mouth 2 (two) times daily with a meal.    Yes Historical Provider, MD  Multiple Vitamin (MULTIVITAMIN WITH MINERALS) TABS tablet Take 1 tablet by mouth at bedtime.   Yes Historical Provider, MD  simvastatin (ZOCOR) 10 MG tablet Take 10 mg by mouth every evening.    Yes Historical Provider, MD  tiZANidine (ZANAFLEX) 2 MG tablet Take 2 mg by mouth at bedtime.   Yes Historical Provider, MD  aspirin 81 MG tablet Take 81 mg by mouth at bedtime.     Historical Provider, MD  fluticasone (FLONASE) 50 MCG/ACT nasal spray Place 1 spray into both nostrils daily as needed for allergies or rhinitis.    Historical Provider, MD  ibuprofen (ADVIL,MOTRIN) 600 MG tablet Take 600 mg by mouth every 12 (twelve) hours.    Historical Provider, MD    History  reviewed. No pertinent family history.  History   Social History  . Marital Status: Married    Spouse Name: N/A    Number of Children: N/A  . Years of Education: N/A   Social History Main Topics  . Smoking status: Current Every Day Smoker    Types: Cigarettes  . Smokeless tobacco: None  . Alcohol Use: None  . Drug Use: None  . Sexual Activity: None   Other Topics Concern  . None   Social History Narrative  . None          Review of Systems  Constitutional: Negative for fever and chills.  Respiratory: Negative for shortness of breath.        Occ cough  Cardiovascular: Negative for chest pain.  Gastrointestinal: Negative for nausea, vomiting, abdominal pain and blood in stool.  Genitourinary: Negative for dysuria and hematuria.  Musculoskeletal: Positive for back pain.  Neurological: Negative for headaches.    Vital Signs: Filed Vitals:   02/23/14 1245  BP: 145/67  Pulse: 63  Temp: 98.3 F (36.8 C)  TempSrc: Oral  Resp: 16  SpO2: 99%    Physical Exam  Constitutional: She is oriented to person, place, and time. She appears well-developed and well-nourished.  Cardiovascular: Normal rate and regular rhythm.   Murmur heard. Pulmonary/Chest: Effort normal and breath sounds normal.  Abdominal: Soft. Bowel sounds are normal. There  is no tenderness.  obese  Musculoskeletal: Normal range of motion.  Neurological: She is alert and oriented to person, place, and time.    Imaging: Mr Breast Bilateral Moise Boring Contrast  02/22/2014   ADDENDUM REPORT: 02/22/2014 09:27  ADDENDUM: 68 year old female with biopsy proven right breast invasive mammary carcinoma.  Patient presented to [facility] 02/22/2014 for a MR guided core biopsy of a 6 mm indeterminate nodule in the 3 o'clock region of the left breast. The biopsy could not be performed secondary to patient's larger size. After the patient was placed in the coil the patient would not fit through the opening of the  magnet and a biopsy could not be performed. I reviewed the patient's MRI performed on 02/12/2014 and I feel that the 6 mm nodule at 3 o'clock posterior depth is likely benign. The patient will receive neoadjuvant chemotherapy. I recommend a follow-up MRI of the nodule during the patient's neoadjuvant chemotherapy. The lesion could also be followed with MRI at six-month intervals following surgery to document stability for 2 years. The patient states that she is also considering bilateral mastectomies. She is comfortable with a short-term interval followup. Findings were called to Dr. Trevor Mace nurse at [facility] surgery.   Electronically Signed   By: Lillia Mountain M.D.   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 Axillary lymph node biopsy was positive for metastatic disease.  LABS:  BUN and creatinine were obtained on site at [facility] at  [address]  Results:  BUN 13 mg/dL,  Creatinine 0.6 mg/dL.  EXAM: BILATERAL BREAST MRI WITH AND WITHOUT CONTRAST  TECHNIQUE: Multiplanar, multisequence MR images of both breasts were obtained prior to and following the intravenous administration of 9ml of MultiHance.  THREE-DIMENSIONAL MR IMAGE RENDERING ON INDEPENDENT WORKSTATION:  Three-dimensional MR images were rendered by post-processing of the original MR data on an independent workstation. The three-dimensional MR images were interpreted, and findings are reported in the following complete MRI report for this study. Three dimensional images were evaluated at the independent DynaCad workstation  COMPARISON:  Diagnostic mammogram and ultrasound dated 02/02/2014  FINDINGS: Breast composition: c:  Heterogeneous fibroglandular tissue  Background parenchymal enhancement: Moderate  Right breast: Multiple irregular enhancing masses are noted throughout the central and lateral  right breast consistent with the known biopsy proven malignancy. The dominant mass within the superior, central right breast measures approximately 6.3 x 3.9 x 5.3 cm and contains nonenhancing areas consistent with necrosis. The irregular masses in total span approximately 8 x 8 x 8 cm in craniocaudal, anterior-posterior, and transverse dimensions. Included in these masses is a 1.7 x 1.5 cm spiculated mass which extends to the posterior aspect of the nipple with mild nipple retraction. The clip artifact from prior biopsy at 8 o'clock is seen on series 7, image 83/128. The clip artifact from prior biopsy at 12 o'clock is not as well seen but is likely present on series 7, axial image 63/128.  Left breast: There is a 6 mm enhancing oval mass within the lateral left breast at 3 o'clock, posterior depth.  Lymph nodes: There are multiple abnormal axillary lymph nodes including both lateral and anterior to the pectoralis minor. There is an additional irregular enhancing mass between the pectoralis minor and pectoralis major (axial image 28/128), likely representing an abnormal lymph node. There are bilateral nonspecific internal mammary lymph nodes  measuring up to 7 mm on the right and 6 mm on the left.  Ancillary findings:  None.  IMPRESSION: 1. Known biopsy proven malignancy within the right breast and axilla. The dominant mass and surrounding satellite nodules span at least 8 cm in each dimension. There is a probable abnormal axillary lymph node between the right pectoralis major and pectoralis minor as above. 2. Indeterminate 6 mm enhancing oval mass within the lateral left breast, posterior depth.  RECOMMENDATION: 1. Clinical management of the patient's known malignancy. 2. Indeterminate 6 mm oval mass within the lateral left breast. Second-look ultrasound is recommended with consideration of MRI guided biopsy if no sonographic correlate is identified.  BI-RADS CATEGORY  4: Suspicious.  Electronically Signed: By:  Pamelia Hoit M.D. On: 02/12/2014 16:44   Nm Pet Image Initial (pi) Skull Base To Thigh  02/17/2014   CLINICAL DATA:  Initial treatment strategy for right breast carcinoma.  EXAM: NUCLEAR MEDICINE PET SKULL BASE TO THIGH  TECHNIQUE: 15.4 mCi F-18 FDG was injected intravenously. Full-ring PET imaging was performed from the skull base to thigh after the radiotracer. CT data was obtained and used for attenuation correction and anatomic localization.  FASTING BLOOD GLUCOSE:  Value: 166 mg/dl  COMPARISON:  None.  FINDINGS: NECK  No hypermetabolic lymph nodes in the neck.  CHEST  There are several hypermetabolic lesions within the right breast with SUV max up to 5.8.  There is a cluster of enlarged hypermetabolic right axial lymph nodes (image 67). Example node in the right axilla measures 24 mm short axis with SUV max 12.8. There is a small hypermetabolic sub pectoralis lymph node measuring 8 mm on image 55. No hypermetabolic infraclavicular or internal mammary lymph nodes. No hypermetabolic mediastinal hilar nodes.  Review of the lung parenchyma demonstrates a pleural-based nodule measuring 6 mm in the right middle lobe (image 40) without associated metabolic activity.  ABDOMEN/PELVIS  No abnormal hypermetabolic activity within the liver, pancreas, adrenal glands, or spleen. No hypermetabolic lymph nodes in the abdomen or pelvis.  SKELETON  There is a a intensely hypermetabolic lesion within the right humeral head with SUV max thecal 9.7. There is associated sclerotic herniated lesion on the CT portion the exam (image 48, series 4).  IMPRESSION: 1. Hypermetabolic tissue within the right breast consists with primary breast carcinoma. 2. Local hypermetabolic right axillary nodal metastasis. 3. Small hypermetabolic sub pectoralis lymph node on the right most consistent metastasis. 4. Small right middle lobe subpleural pulmonary nodule is indeterminate. Recommend attention on follow-up. 5. Hypermetabolic permeative lesion  in the proximal right humerus is highly concerning for skeletal metastasis (stage IV breast cancer). Consider percutaneous biopsy to confirm.   Electronically Signed   By: Suzy Bouchard M.D.   On: 02/17/2014 14:52    Labs:  CBC:  Recent Labs  02/23/14 1105  WBC 6.9  HGB 13.5  HCT 42.2  PLT 292    COAGS:  Recent Labs  02/23/14 1105  INR 0.96  APTT 28    BMP:  Recent Labs  02/23/14 1105  NA 141  K 4.6  CL 99  CO2 26  GLUCOSE 182*  BUN 14  CALCIUM 10.1  CREATININE 0.62  GFRNONAA >90  GFRAA >90    LIVER FUNCTION TESTS: No results found for this basename: BILITOT, AST, ALT, ALKPHOS, PROT, ALBUMIN,  in the last 8760 hours  TUMOR MARKERS: No results found for this basename: AFPTM, CEA, CA199, CHROMGRNA,  in the last 8760 hours  Assessment and  Plan: Tonya Alvarez is a 68 y.o. female with history of recently diagnosed right breast carcinoma who presents today for port a cath placement for chemotherapy. Details/risks of procedure d/w pt/daughter with their understanding and consent.          Signed: Autumn Messing 02/23/2014, 12:33 PM

## 2014-02-23 NOTE — Discharge Instructions (Signed)
Implanted Port Home Guide °An implanted port is a type of central line that is placed under the skin. Central lines are used to provide IV access when treatment or nutrition needs to be given through a person's veins. Implanted ports are used for long-term IV access. An implanted port may be placed because:  °· You need IV medicine that would be irritating to the small veins in your hands or arms.   °· You need long-term IV medicines, such as antibiotics.   °· You need IV nutrition for a long period.   °· You need frequent blood draws for lab tests.   °· You need dialysis.   °Implanted ports are usually placed in the chest area, but they can also be placed in the upper arm, the abdomen, or the leg. An implanted port has two main parts:  °· Reservoir. The reservoir is round and will appear as a small, raised area under your skin. The reservoir is the part where a needle is inserted to give medicines or draw blood.   °· Catheter. The catheter is a thin, flexible tube that extends from the reservoir. The catheter is placed into a large vein. Medicine that is inserted into the reservoir goes into the catheter and then into the vein.   °HOW WILL I CARE FOR MY INCISION SITE? °Do not get the incision site wet. Bathe or shower as directed by your health care provider.  °HOW IS MY PORT ACCESSED? °Special steps must be taken to access the port:  °· Before the port is accessed, a numbing cream can be placed on the skin. This helps numb the skin over the port site.   °· Your health care provider uses a sterile technique to access the port. °¨ Your health care provider must put on a mask and sterile gloves. °¨ The skin over your port is cleaned carefully with an antiseptic and allowed to dry. °¨ The port is gently pinched between sterile gloves, and a needle is inserted into the port. °· Only "non-coring" port needles should be used to access the port. Once the port is accessed, a blood return should be checked. This helps  ensure that the port is in the vein and is not clogged.   °· If your port needs to remain accessed for a constant infusion, a clear (transparent) bandage will be placed over the needle site. The bandage and needle will need to be changed every week, or as directed by your health care provider.   °· Keep the bandage covering the needle clean and dry. Do not get it wet. Follow your health care provider's instructions on how to take a shower or bath while the port is accessed.   °· If your port does not need to stay accessed, no bandage is needed over the port.   °WHAT IS FLUSHING? °Flushing helps keep the port from getting clogged. Follow your health care provider's instructions on how and when to flush the port. Ports are usually flushed with saline solution or a medicine called heparin. The need for flushing will depend on how the port is used.  °· If the port is used for intermittent medicines or blood draws, the port will need to be flushed:   °¨ After medicines have been given.   °¨ After blood has been drawn.   °¨ As part of routine maintenance.   °· If a constant infusion is running, the port may not need to be flushed.   °HOW LONG WILL MY PORT STAY IMPLANTED? °The port can stay in for as long as your health care   provider thinks it is needed. When it is time for the port to come out, surgery will be done to remove it. The procedure is similar to the one performed when the port was put in.  °WHEN SHOULD I SEEK IMMEDIATE MEDICAL CARE? °When you have an implanted port, you should seek immediate medical care if:  °· You notice a bad smell coming from the incision site.   °· You have swelling, redness, or drainage at the incision site.   °· You have more swelling or pain at the port site or the surrounding area.   °· You have a fever that is not controlled with medicine. °Document Released: 04/23/2005 Document Revised: 02/11/2013 Document Reviewed: 12/29/2012 °[employer]® Patient Information ©2015 [employer]. This  information is not intended to replace advice given to you by your health care provider. Make sure you discuss any questions you have with your health care provider. ° °Conscious Sedation, Adult, Care After °Refer to this sheet in the next few weeks. These instructions provide you with information on caring for yourself after your procedure. Your health care provider may also give you more specific instructions. Your treatment has been planned according to current medical practices, but problems sometimes occur. Call your health care provider if you have any problems or questions after your procedure. °WHAT TO EXPECT AFTER THE PROCEDURE  °After your procedure: °· You may feel sleepy, clumsy, and have poor balance for several hours. °· Vomiting may occur if you eat too soon after the procedure. °HOME CARE INSTRUCTIONS °· Do not participate in any activities where you could become injured for at least 24 hours. Do not: °¨ Drive. °¨ Swim. °¨ Ride a bicycle. °¨ Operate heavy machinery. °¨ Cook. °¨ Use power tools. °¨ Climb ladders. °¨ Work from a high place. °· Do not make important decisions or sign legal documents until you are improved. °· If you vomit, drink water, juice, or soup when you can drink without vomiting. Make sure you have little or no nausea before eating solid foods. °· Only take over-the-counter or prescription medicines for pain, discomfort, or fever as directed by your health care provider. °· Make sure you and your family fully understand everything about the medicines given to you, including what side effects may occur. °· You should not drink alcohol, take sleeping pills, or take medicines that cause drowsiness for at least 24 hours. °· If you smoke, do not smoke without supervision. °· If you are feeling better, you may resume normal activities 24 hours after you were sedated. °· Keep all appointments with your health care provider. °SEEK MEDICAL CARE IF: °· Your skin is pale or bluish in  color. °· You continue to feel nauseous or vomit. °· Your pain is getting worse and is not helped by medicine. °· You have bleeding or swelling. °· You are still sleepy or feeling clumsy after 24 hours. °SEEK IMMEDIATE MEDICAL CARE IF: °· You develop a rash. °· You have difficulty breathing. °· You develop any type of allergic problem. °· You have a fever. °MAKE SURE YOU: °· Understand these instructions. °· Will watch your condition. °· Will get help right away if you are not doing well or get worse. °Document Released: 02/11/2013 Document Reviewed: 02/11/2013 °[employer]® Patient Information ©2015 [employer]. This information is not intended to replace advice given to you by your health care provider. Make sure you discuss any questions you have with your health care provider. ° ° °

## 2014-02-25 ENCOUNTER — Ambulatory Visit (HOSPITAL_COMMUNITY)
Admission: RE | Admit: 2014-02-25 | Discharge: 2014-02-25 | Disposition: A | Discharge status: Home / Self Care | Payer: Medicare Other | Source: Ambulatory Visit | Attending: Hematology and Oncology | Admitting: Hematology and Oncology

## 2014-02-25 ENCOUNTER — Encounter (HOSPITAL_COMMUNITY): Payer: Self-pay

## 2014-02-25 ENCOUNTER — Telehealth: Payer: Self-pay

## 2014-02-25 DIAGNOSIS — E119 Type 2 diabetes mellitus without complications: Secondary | ICD-10-CM | POA: Diagnosis not present

## 2014-02-25 DIAGNOSIS — D0591 Unspecified type of carcinoma in situ of right breast: Secondary | ICD-10-CM | POA: Diagnosis not present

## 2014-02-25 DIAGNOSIS — M899 Disorder of bone, unspecified: Secondary | ICD-10-CM | POA: Diagnosis present

## 2014-02-25 DIAGNOSIS — Z7982 Long term (current) use of aspirin: Secondary | ICD-10-CM | POA: Diagnosis not present

## 2014-02-25 DIAGNOSIS — C7951 Secondary malignant neoplasm of bone: Secondary | ICD-10-CM | POA: Diagnosis not present

## 2014-02-25 DIAGNOSIS — C50911 Malignant neoplasm of unspecified site of right female breast: Secondary | ICD-10-CM | POA: Insufficient documentation

## 2014-02-25 DIAGNOSIS — Z79899 Other long term (current) drug therapy: Secondary | ICD-10-CM | POA: Diagnosis not present

## 2014-02-25 DIAGNOSIS — C50411 Malignant neoplasm of upper-outer quadrant of right female breast: Secondary | ICD-10-CM

## 2014-02-25 LAB — GLUCOSE, CAPILLARY: GLUCOSE-CAPILLARY: 165 mg/dL — AB (ref 70–99)

## 2014-02-25 MED ORDER — SODIUM CHLORIDE 0.9 % IV SOLN: INTRAVENOUS | Status: DC

## 2014-02-25 MED ORDER — LIDOCAINE-EPINEPHRINE 1 %-1:100000 IJ SOLN
INTRAMUSCULAR | Status: AC
  Filled 2014-02-25: qty 1

## 2014-02-25 MED ORDER — MIDAZOLAM HCL 2 MG/2ML IJ SOLN
INTRAMUSCULAR | Status: AC
  Filled 2014-02-25: qty 4

## 2014-02-25 MED ORDER — FENTANYL CITRATE 0.05 MG/ML IJ SOLN
INTRAMUSCULAR | Status: AC
  Filled 2014-02-25: qty 2

## 2014-02-25 MED ORDER — FENTANYL CITRATE 0.05 MG/ML IJ SOLN
INTRAMUSCULAR | Status: AC | PRN
  Administered 2014-02-25 (×2): 25 ug via INTRAVENOUS
  Administered 2014-02-25: 50 ug via INTRAVENOUS

## 2014-02-25 MED ORDER — MIDAZOLAM HCL 2 MG/2ML IJ SOLN
INTRAMUSCULAR | Status: AC | PRN
  Administered 2014-02-25: 0.5 mg via INTRAVENOUS
  Administered 2014-02-25: 1 mg via INTRAVENOUS
  Administered 2014-02-25: 0.5 mg via INTRAVENOUS

## 2014-02-25 NOTE — Discharge Instructions (Signed)
Needle Biopsy °Care After °These instructions give you information on caring for yourself after your procedure. Your doctor may also give you more specific instructions. Call your doctor if you have any problems or questions after your procedure. °HOME CARE °· Rest for 4 hours after your biopsy, except for getting up to go to the bathroom or as told. °· Keep the places where the needles were put in clean and dry. °¨ Do not put powder or lotion on the sites. °¨ Do not shower until 24 hours after the test. Remove all bandages (dressings) before showering. °¨ Remove all bandages at least once every day. Gently clean the sites with soap and water. Keep putting a new bandage on until the skin is closed. °Finding out the results of your test °Ask your doctor when your test results will be ready. Make sure you follow up and get the test results. °GET HELP RIGHT AWAY IF:  °· You have shortness of breath or trouble breathing. °· You have pain or cramping in your belly (abdomen). °· You feel sick to your stomach (nauseous) or throw up (vomit). °· Any of the places where the needles were put in: °¨ Are puffy (swollen) or red. °¨ Are sore or hot to the touch. °¨ Are draining yellowish-white fluid (pus). °¨ Are bleeding after 10 minutes of pressing down on the site. Have someone keep pressing on any place that is bleeding until you see a doctor. °· You have any unusual pain that will not stop. °· You have a fever. °If you go to the emergency room, tell the nurse that you had a biopsy. Take this paper with you to show the nurse. °MAKE SURE YOU:  °· Understand these instructions. °· Will watch your condition. °· Will get help right away if you are not doing well or get worse. °Document Released: 04/05/2008 Document Revised: 07/16/2011 Document Reviewed: 04/05/2008 °[employer]® Patient Information ©2015 [employer]. This information is not intended to replace advice given to you by your health care provider. Make sure you discuss  any questions you have with your health care provider. ° °

## 2014-02-25 NOTE — H&P (Signed)
Chief Complaint: New Dx Breast Ca Abnormal PET; +Rt humerus lesion  Referring Physician(s): Gudena,Vinay K  History of Present Illness: Tonya Alvarez is a 68 y.o. female  Pt with new diagnosis Breast Ca +PET: Rt breast lesion; Rt axillary LAN; Rt humerus lesion Now scheduled for Rt humerus lesion biopsy PAC placed in IR 02/23/14   Past Medical History  Diagnosis Date  . Heart murmur     mitral valve  . Diabetes mellitus without complication     Past Surgical History  Procedure Laterality Date  . Joint replacement  left total knee  . Tubal ligation    . Breast biopsy Right   . Tonsillectomy      68yrs old    Allergies: Amoxicillin; Lisinopril; and Oxycodone  Medications: Prior to Admission medications   Medication Sig Start Date End Date Taking? Authorizing Provider  fluticasone (FLONASE) 50 MCG/ACT nasal spray Place 1 spray into both nostrils daily as needed for allergies or rhinitis.   Yes Historical Provider, MD  insulin aspart protamine- aspart (NOVOLOG MIX 70/30) (70-30) 100 UNIT/ML injection Inject 60-68 Units into the skin 2 (two) times daily.   Yes Historical Provider, MD  losartan (COZAAR) 50 MG tablet Take 50 mg by mouth every morning.   Yes Historical Provider, MD  metFORMIN (GLUCOPHAGE) 500 MG tablet Take 1,000 mg by mouth 2 (two) times daily with a meal.    Yes Historical Provider, MD  Multiple Vitamin (MULTIVITAMIN WITH MINERALS) TABS tablet Take 1 tablet by mouth at bedtime.   Yes Historical Provider, MD  simvastatin (ZOCOR) 10 MG tablet Take 10 mg by mouth every evening.    Yes Historical Provider, MD  tiZANidine (ZANAFLEX) 2 MG tablet Take 2 mg by mouth at bedtime.   Yes Historical Provider, MD  acetaminophen (TYLENOL) 500 MG tablet Take 500 mg by mouth every 6 (six) hours as needed.    Historical Provider, MD  aspirin 81 MG tablet Take 81 mg by mouth at bedtime.     Historical Provider, MD  ibuprofen (ADVIL,MOTRIN) 600 MG tablet Take 600 mg by  mouth every 12 (twelve) hours.    Historical Provider, MD    No family history on file.  History   Social History  . Marital Status: Married    Spouse Name: N/A    Number of Children: N/A  . Years of Education: N/A   Social History Main Topics  . Smoking status: Current Every Day Smoker    Types: Cigarettes  . Smokeless tobacco: None  . Alcohol Use: None  . Drug Use: None  . Sexual Activity: None   Other Topics Concern  . None   Social History Narrative  . None    Review of Systems: A 12 point ROS discussed and pertinent positives are indicated in the HPI above.  All other systems are negative.  Review of Systems  Constitutional: Negative for fever, activity change, appetite change and unexpected weight change.  Respiratory: Negative for cough and shortness of breath.   Cardiovascular: Negative for chest pain.       Rt ant chest soreness from new PAC  Gastrointestinal: Negative for abdominal pain.  Genitourinary: Negative for difficulty urinating.  Musculoskeletal: Negative for back pain.  Psychiatric/Behavioral: Negative for behavioral problems and confusion.    Vital Signs: BP 161/71  Pulse 69  Temp(Src) 97.5 F (36.4 C) (Oral)  Resp 18  Ht 5' 9.5" (1.765 m)  Wt 139.708 kg (308 lb)  BMI 44.85 kg/m2  SpO2 97%  Physical Exam  Constitutional: She is oriented to person, place, and time. She appears well-nourished.  obese  Cardiovascular: Normal rate and regular rhythm.   No murmur heard. Pulmonary/Chest: Effort normal and breath sounds normal. She has no wheezes.  Abdominal: Soft. Bowel sounds are normal. There is no tenderness.  Musculoskeletal: Normal range of motion. She exhibits tenderness.  Tenderness at Rt humerus  Neurological: She is alert and oriented to person, place, and time.  Skin: Skin is warm and dry.  Psychiatric: She has a normal mood and affect. Her behavior is normal. Judgment and thought content normal.    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  Patient presented to [facility] 02/22/2014 for a MR guided core biopsy of a 6 mm indeterminate nodule in the 3 o'clock region of the left breast. The biopsy could not be performed secondary to patient's larger size. After the patient was placed in the coil the patient would not fit through the opening of the magnet and a biopsy could not be performed. I reviewed the patient's MRI performed on 02/12/2014 and I feel that the 6 mm nodule at 3 o'clock posterior depth is likely benign. The patient will receive neoadjuvant chemotherapy. I recommend a follow-up MRI of the nodule during the patient's neoadjuvant chemotherapy. The lesion could also be followed with MRI at six-month intervals following surgery to document stability for 2 years. The patient states that she is also considering bilateral mastectomies. She is comfortable with a short-term interval followup. Findings were called to Dr. Trevor Mace nurse at [facility] surgery.   Electronically Signed   By: Lillia Mountain M.D.   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 Axillary lymph node biopsy was positive for metastatic disease.  LABS:  BUN and creatinine were obtained on site at [facility] at  [address]  Results:  BUN 13 mg/dL,  Creatinine 0.6 mg/dL.  EXAM: BILATERAL BREAST MRI WITH AND WITHOUT CONTRAST  TECHNIQUE: Multiplanar, multisequence MR images of both breasts were obtained prior to and following the intravenous administration of 73ml of MultiHance.  THREE-DIMENSIONAL MR IMAGE RENDERING ON INDEPENDENT WORKSTATION:  Three-dimensional MR images were rendered by post-processing of the original MR data on an independent workstation. The  three-dimensional MR images were interpreted, and findings are reported in the following complete MRI report for this study. Three dimensional images were evaluated at the independent DynaCad workstation  COMPARISON:  Diagnostic mammogram and ultrasound dated 02/02/2014  FINDINGS: Breast composition: c:  Heterogeneous fibroglandular tissue  Background parenchymal enhancement: Moderate  Right breast: Multiple irregular enhancing masses are noted throughout the central and lateral right breast consistent with the known biopsy proven malignancy. The dominant mass within the superior, central right breast measures approximately 6.3 x 3.9 x 5.3 cm and contains nonenhancing areas consistent with necrosis. The irregular masses in total span approximately 8 x 8 x 8 cm in craniocaudal, anterior-posterior, and transverse dimensions. Included in these masses is a 1.7 x 1.5 cm spiculated mass which extends to the posterior aspect of the nipple with mild nipple retraction. The clip artifact from prior biopsy at 8 o'clock is seen on series 7, image 83/128. The clip artifact from prior biopsy at 12 o'clock is not as well  seen but is likely present on series 7, axial image 63/128.  Left breast: There is a 6 mm enhancing oval mass within the lateral left breast at 3 o'clock, posterior depth.  Lymph nodes: There are multiple abnormal axillary lymph nodes including both lateral and anterior to the pectoralis minor. There is an additional irregular enhancing mass between the pectoralis minor and pectoralis major (axial image 28/128), likely representing an abnormal lymph node. There are bilateral nonspecific internal mammary lymph nodes measuring up to 7 mm on the right and 6 mm on the left.  Ancillary findings:  None.  IMPRESSION: 1. Known biopsy proven malignancy within the right breast and axilla. The dominant mass and surrounding satellite nodules span at least 8 cm in each dimension. There is a probable abnormal axillary lymph  node between the right pectoralis major and pectoralis minor as above. 2. Indeterminate 6 mm enhancing oval mass within the lateral left breast, posterior depth.  RECOMMENDATION: 1. Clinical management of the patient's known malignancy. 2. Indeterminate 6 mm oval mass within the lateral left breast. Second-look ultrasound is recommended with consideration of MRI guided biopsy if no sonographic correlate is identified.  BI-RADS CATEGORY  4: Suspicious.  Electronically Signed: By: Pamelia Hoit M.D. On: 02/12/2014 16:44   Nm Pet Image Initial (pi) Skull Base To Thigh  02/17/2014   CLINICAL DATA:  Initial treatment strategy for right breast carcinoma.  EXAM: NUCLEAR MEDICINE PET SKULL BASE TO THIGH  TECHNIQUE: 15.4 mCi F-18 FDG was injected intravenously. Full-ring PET imaging was performed from the skull base to thigh after the radiotracer. CT data was obtained and used for attenuation correction and anatomic localization.  FASTING BLOOD GLUCOSE:  Value: 166 mg/dl  COMPARISON:  None.  FINDINGS: NECK  No hypermetabolic lymph nodes in the neck.  CHEST  There are several hypermetabolic lesions within the right breast with SUV max up to 5.8.  There is a cluster of enlarged hypermetabolic right axial lymph nodes (image 67). Example node in the right axilla measures 24 mm short axis with SUV max 12.8. There is a small hypermetabolic sub pectoralis lymph node measuring 8 mm on image 55. No hypermetabolic infraclavicular or internal mammary lymph nodes. No hypermetabolic mediastinal hilar nodes.  Review of the lung parenchyma demonstrates a pleural-based nodule measuring 6 mm in the right middle lobe (image 40) without associated metabolic activity.  ABDOMEN/PELVIS  No abnormal hypermetabolic activity within the liver, pancreas, adrenal glands, or spleen. No hypermetabolic lymph nodes in the abdomen or pelvis.  SKELETON  There is a a intensely hypermetabolic lesion within the right humeral head with SUV max thecal 9.7. There  is associated sclerotic herniated lesion on the CT portion the exam (image 48, series 4).  IMPRESSION: 1. Hypermetabolic tissue within the right breast consists with primary breast carcinoma. 2. Local hypermetabolic right axillary nodal metastasis. 3. Small hypermetabolic sub pectoralis lymph node on the right most consistent metastasis. 4. Small right middle lobe subpleural pulmonary nodule is indeterminate. Recommend attention on follow-up. 5. Hypermetabolic permeative lesion in the proximal right humerus is highly concerning for skeletal metastasis (stage IV breast cancer). Consider percutaneous biopsy to confirm.   Electronically Signed   By: Suzy Bouchard M.D.   On: 02/17/2014 14:52   Ir Fluoro Guide Cv Line Right  02/23/2014   CLINICAL DATA:  breast carcinoma, needs access for chemotherapy.  EXAM: TUNNELED PORT CATHETER PLACEMENT WITH ULTRASOUND AND FLUOROSCOPIC GUIDANCE  FLUOROSCOPY TIME:  24 seconds  ANESTHESIA/SEDATION: Intravenous Fentanyl and Versed  were administered as conscious sedation during continuous cardiorespiratory monitoring by the radiology RN, with a total moderate sedation time of 25 minutes.  TECHNIQUE: The procedure, risks, benefits, and alternatives were explained to the patient. Questions regarding the procedure were encouraged and answered. The patient understands and consents to the procedure. As antibiotic prophylaxis, vancomycin 1.5 g was ordered pre-procedure and administered intravenously within one hour of incision. Patency of the right IJ vein was confirmed with ultrasound with image documentation. An appropriate skin site was determined. Skin site was marked. Region was prepped using maximum barrier technique including cap and mask, sterile gown, sterile gloves, large sterile sheet, and Chlorhexidine as cutaneous antisepsis. The region was infiltrated locally with 1% lidocaine. Under real-time ultrasound guidance, the right IJ vein was accessed with a 21 gauge  micropuncture needle; the needle tip within the vein was confirmed with ultrasound image documentation. Needle was exchanged over a 018 guidewire for transitional dilator which allowed passage of the [facility] wire into the IVC. Over this, the transitional dilator was exchanged for a 5 [place] MPA catheter. A small incision was made on the right anterior chest wall and a subcutaneous pocket fashioned. The power-injectable port was positioned and its catheter tunneled to the right IJ dermatotomy site. The MPA catheter was exchanged over an Amplatz wire for a peel-away sheath, through which the port catheter, which had been trimmed to the appropriate length, was advanced and positioned under fluoroscopy with its tip at the cavoatrial junction. Spot chest radiograph confirms good catheter position and no pneumothorax. The pocket was closed with deep interrupted and subcuticular continuous 3-0 Monocryl sutures. The port was flushed per protocol. The incisions were covered with Dermabond then covered with a sterile dressing.  COMPLICATIONS: COMPLICATIONS None immediate  IMPRESSION: Technically successful right IJ power-injectable port catheter placement. Ready for routine use.   Electronically Signed   By: Arne Cleveland M.D.   On: 02/23/2014 15:15   Ir US Guide Vasc Access Right  02/23/2014   CLINICAL DATA:  breast carcinoma, needs access for chemotherapy.  EXAM: TUNNELED PORT CATHETER PLACEMENT WITH ULTRASOUND AND FLUOROSCOPIC GUIDANCE  FLUOROSCOPY TIME:  24 seconds  ANESTHESIA/SEDATION: Intravenous Fentanyl and Versed were administered as conscious sedation during continuous cardiorespiratory monitoring by the radiology RN, with a total moderate sedation time of 25 minutes.  TECHNIQUE: The procedure, risks, benefits, and alternatives were explained to the patient. Questions regarding the procedure were encouraged and answered. The patient understands and consents to the procedure. As antibiotic prophylaxis,  vancomycin 1.5 g was ordered pre-procedure and administered intravenously within one hour of incision. Patency of the right IJ vein was confirmed with ultrasound with image documentation. An appropriate skin site was determined. Skin site was marked. Region was prepped using maximum barrier technique including cap and mask, sterile gown, sterile gloves, large sterile sheet, and Chlorhexidine as cutaneous antisepsis. The region was infiltrated locally with 1% lidocaine. Under real-time ultrasound guidance, the right IJ vein was accessed with a 21 gauge micropuncture needle; the needle tip within the vein was confirmed with ultrasound image documentation. Needle was exchanged over a 018 guidewire for transitional dilator which allowed passage of the [facility] wire into the IVC. Over this, the transitional dilator was exchanged for a 5 [place] MPA catheter. A small incision was made on the right anterior chest wall and a subcutaneous pocket fashioned. The power-injectable port was positioned and its catheter tunneled to the right IJ dermatotomy site. The MPA catheter was exchanged over an Amplatz wire for a  peel-away sheath, through which the port catheter, which had been trimmed to the appropriate length, was advanced and positioned under fluoroscopy with its tip at the cavoatrial junction. Spot chest radiograph confirms good catheter position and no pneumothorax. The pocket was closed with deep interrupted and subcuticular continuous 3-0 Monocryl sutures. The port was flushed per protocol. The incisions were covered with Dermabond then covered with a sterile dressing.  COMPLICATIONS: COMPLICATIONS None immediate  IMPRESSION: Technically successful right IJ power-injectable port catheter placement. Ready for routine use.   Electronically Signed   By: Arne Cleveland M.D.   On: 02/23/2014 15:15    Labs:  CBC:  Recent Labs  02/23/14 1105  WBC 6.9  HGB 13.5  HCT 42.2  PLT 292    COAGS:  Recent Labs   02/23/14 1105  INR 0.96  APTT 28    BMP:  Recent Labs  02/23/14 1105  NA 141  K 4.6  CL 99  CO2 26  GLUCOSE 182*  BUN 14  CALCIUM 10.1  CREATININE 0.62  GFRNONAA >90  GFRAA >90    LIVER FUNCTION TESTS: No results found for this basename: BILITOT, AST, ALT, ALKPHOS, PROT, ALBUMIN,  in the last 8760 hours  TUMOR MARKERS: No results found for this basename: AFPTM, CEA, CA199, CHROMGRNA,  in the last 8760 hours  Assessment and Plan:  New dx Breast Ca +PET Rt humerus lesion Now scheduled for bx of same Pt aware of procedure benefits and risks and agreeable to proceed Consent signed and in chart  Thank you for this interesting consult.  I greatly enjoyed meeting AZHAR YOGI and look forward to participating in their care.    I spent a total of 20 minutes face to face in clinical consultation, greater than 50% of which was counseling/coordinating care for Rt humerus lesion bx  Signed: Jaykwon Morones A 02/25/2014, 8:20 AM

## 2014-02-25 NOTE — Telephone Encounter (Signed)
Error

## 2014-02-25 NOTE — Procedures (Signed)
Technically successful CT guided biopsy of hypermetabolic right humeral mass  No immediate post procedural complications.

## 2014-02-25 NOTE — Progress Notes (Signed)
Orthopedic Tech Progress Note Patient Details:  Tonya Alvarez 05/11/1945 [id number] Delivered arm sling to CT tech. Patient ID: Tonya Alvarez, female   DOB: 01/30/1946, 68 y.o.   MRN: [id number]   Darrol Poke 02/25/2014, 9:24 AM

## 2014-02-26 ENCOUNTER — Telehealth: Payer: Self-pay

## 2014-02-26 NOTE — Telephone Encounter (Signed)
Mammogram results rcvd from [facility] 02/16/14.  Copy to Dr Lindi Adie.  Original to scan.

## 2014-03-01 ENCOUNTER — Telehealth: Payer: Self-pay | Admitting: *Deleted

## 2014-03-01 ENCOUNTER — Encounter: Payer: Self-pay | Admitting: Hematology and Oncology

## 2014-03-01 NOTE — Telephone Encounter (Signed)
Received vm call from pt stating that she would like the results of her biopsy done thurs at [facility]  Returned call & informed pt that I would f/u with results & make sure Dr Lindi Adie gets report & will f/u with her tomorrow.  Pt reports being very anxious & ready to start chemo today.  Note to Dr Lindi Adie.

## 2014-03-01 NOTE — Progress Notes (Signed)
No date for treatment

## 2014-03-02 ENCOUNTER — Other Ambulatory Visit: Payer: Self-pay | Admitting: Hematology and Oncology

## 2014-03-02 DIAGNOSIS — C50411 Malignant neoplasm of upper-outer quadrant of right female breast: Secondary | ICD-10-CM

## 2014-03-02 MED ORDER — LIDOCAINE-PRILOCAINE 2.5-2.5 % EX CREA: 1.0000 "application " | TOPICAL_CREAM | CUTANEOUS | Status: DC | PRN

## 2014-03-02 MED ORDER — ONDANSETRON HCL 8 MG PO TABS: 8.0000 mg | ORAL_TABLET | Freq: Two times a day (BID) | ORAL | Status: DC | PRN

## 2014-03-02 MED ORDER — PROCHLORPERAZINE MALEATE 10 MG PO TABS: 10.0000 mg | ORAL_TABLET | Freq: Four times a day (QID) | ORAL | Status: DC | PRN

## 2014-03-02 MED ORDER — LORAZEPAM 0.5 MG PO TABS: 0.5000 mg | ORAL_TABLET | Freq: Four times a day (QID) | ORAL | Status: DC | PRN

## 2014-03-02 MED ORDER — DEXAMETHASONE 4 MG PO TABS: ORAL_TABLET | ORAL | Status: DC

## 2014-03-02 NOTE — Progress Notes (Signed)
LMOVM - pt to start chemo Fri 10/30.  Pt to call clinic if she has any questions.

## 2014-03-03 ENCOUNTER — Other Ambulatory Visit: Payer: Self-pay | Admitting: *Deleted

## 2014-03-03 ENCOUNTER — Telehealth: Payer: Self-pay | Admitting: Hematology and Oncology

## 2014-03-03 ENCOUNTER — Telehealth: Payer: Self-pay | Admitting: *Deleted

## 2014-03-03 NOTE — Telephone Encounter (Signed)
, °

## 2014-03-03 NOTE — Telephone Encounter (Signed)
Per staff message and POF I have scheduled appts. Advised scheduler of appts. JMW  

## 2014-03-04 ENCOUNTER — Encounter: Payer: Self-pay | Admitting: *Deleted

## 2014-03-04 ENCOUNTER — Other Ambulatory Visit: Payer: Self-pay | Admitting: *Deleted

## 2014-03-04 ENCOUNTER — Other Ambulatory Visit: Payer: Self-pay | Admitting: Hematology and Oncology

## 2014-03-04 DIAGNOSIS — C50411 Malignant neoplasm of upper-outer quadrant of right female breast: Secondary | ICD-10-CM

## 2014-03-05 ENCOUNTER — Ambulatory Visit (HOSPITAL_BASED_OUTPATIENT_CLINIC_OR_DEPARTMENT_OTHER): Payer: Medicare Other

## 2014-03-05 ENCOUNTER — Encounter: Payer: Self-pay | Admitting: *Deleted

## 2014-03-05 ENCOUNTER — Other Ambulatory Visit (HOSPITAL_BASED_OUTPATIENT_CLINIC_OR_DEPARTMENT_OTHER): Payer: Medicare Other

## 2014-03-05 ENCOUNTER — Other Ambulatory Visit: Payer: Medicare Other

## 2014-03-05 ENCOUNTER — Ambulatory Visit (HOSPITAL_BASED_OUTPATIENT_CLINIC_OR_DEPARTMENT_OTHER): Payer: Medicare Other | Admitting: Hematology and Oncology

## 2014-03-05 ENCOUNTER — Telehealth: Payer: Self-pay | Admitting: Hematology and Oncology

## 2014-03-05 VITALS — BP 157/69 | HR 67 | Temp 98.2°F | Resp 18 | Ht 69.5 in | Wt 312.6 lb

## 2014-03-05 DIAGNOSIS — G609 Hereditary and idiopathic neuropathy, unspecified: Secondary | ICD-10-CM

## 2014-03-05 DIAGNOSIS — Z5111 Encounter for antineoplastic chemotherapy: Secondary | ICD-10-CM

## 2014-03-05 DIAGNOSIS — C50411 Malignant neoplasm of upper-outer quadrant of right female breast: Secondary | ICD-10-CM

## 2014-03-05 DIAGNOSIS — C773 Secondary and unspecified malignant neoplasm of axilla and upper limb lymph nodes: Secondary | ICD-10-CM

## 2014-03-05 LAB — COMPREHENSIVE METABOLIC PANEL (CC13)
ALBUMIN: 3.5 g/dL (ref 3.5–5.0)
ALT: 19 U/L (ref 0–55)
AST: 14 U/L (ref 5–34)
Alkaline Phosphatase: 86 U/L (ref 40–150)
Anion Gap: 9 mEq/L (ref 3–11)
BILIRUBIN TOTAL: 0.32 mg/dL (ref 0.20–1.20)
BUN: 14.8 mg/dL (ref 7.0–26.0)
CALCIUM: 9.7 mg/dL (ref 8.4–10.4)
CHLORIDE: 104 meq/L (ref 98–109)
CO2: 26 meq/L (ref 22–29)
Creatinine: 0.8 mg/dL (ref 0.6–1.1)
Glucose: 176 mg/dl — ABNORMAL HIGH (ref 70–140)
Potassium: 4.4 mEq/L (ref 3.5–5.1)
SODIUM: 139 meq/L (ref 136–145)
TOTAL PROTEIN: 6.8 g/dL (ref 6.4–8.3)

## 2014-03-05 LAB — CBC WITH DIFFERENTIAL/PLATELET
BASO%: 0.9 % (ref 0.0–2.0)
Basophils Absolute: 0.1 10*3/uL (ref 0.0–0.1)
EOS ABS: 0.2 10*3/uL (ref 0.0–0.5)
EOS%: 2.5 % (ref 0.0–7.0)
HCT: 38.8 % (ref 34.8–46.6)
HGB: 12.4 g/dL (ref 11.6–15.9)
LYMPH%: 22.9 % (ref 14.0–49.7)
MCH: 26.6 pg (ref 25.1–34.0)
MCHC: 31.9 g/dL (ref 31.5–36.0)
MCV: 83.6 fL (ref 79.5–101.0)
MONO#: 0.7 10*3/uL (ref 0.1–0.9)
MONO%: 9.1 % (ref 0.0–14.0)
NEUT%: 64.6 % (ref 38.4–76.8)
NEUTROS ABS: 4.9 10*3/uL (ref 1.5–6.5)
PLATELETS: 256 10*3/uL (ref 145–400)
RBC: 4.64 10*6/uL (ref 3.70–5.45)
RDW: 16.9 % — ABNORMAL HIGH (ref 11.2–14.5)
WBC: 7.6 10*3/uL (ref 3.9–10.3)
lymph#: 1.8 10*3/uL (ref 0.9–3.3)

## 2014-03-05 MED ORDER — SODIUM CHLORIDE 0.9 % IV SOLN
600.0000 mg/m2 | Freq: Once | INTRAVENOUS | Status: AC
  Administered 2014-03-05: 1580 mg via INTRAVENOUS
  Filled 2014-03-05: qty 79

## 2014-03-05 MED ORDER — DEXAMETHASONE SODIUM PHOSPHATE 20 MG/5ML IJ SOLN
12.0000 mg | Freq: Once | INTRAMUSCULAR | Status: AC
  Administered 2014-03-05: 12 mg via INTRAVENOUS

## 2014-03-05 MED ORDER — DEXAMETHASONE SODIUM PHOSPHATE 20 MG/5ML IJ SOLN
INTRAMUSCULAR | Status: AC
  Filled 2014-03-05: qty 5

## 2014-03-05 MED ORDER — DOXORUBICIN HCL CHEMO IV INJECTION 2 MG/ML
60.0000 mg/m2 | Freq: Once | INTRAVENOUS | Status: AC
  Administered 2014-03-05: 158 mg via INTRAVENOUS
  Filled 2014-03-05: qty 79

## 2014-03-05 MED ORDER — HEPARIN SOD (PORK) LOCK FLUSH 100 UNIT/ML IV SOLN
500.0000 [IU] | Freq: Once | INTRAVENOUS | Status: AC | PRN
  Administered 2014-03-05: 500 [IU]
  Filled 2014-03-05: qty 5

## 2014-03-05 MED ORDER — SODIUM CHLORIDE 0.9 % IJ SOLN
10.0000 mL | INTRAMUSCULAR | Status: DC | PRN
  Administered 2014-03-05: 10 mL
  Filled 2014-03-05: qty 10

## 2014-03-05 MED ORDER — SODIUM CHLORIDE 0.9 % IV SOLN
150.0000 mg | Freq: Once | INTRAVENOUS | Status: AC
  Administered 2014-03-05: 150 mg via INTRAVENOUS
  Filled 2014-03-05: qty 5

## 2014-03-05 MED ORDER — PALONOSETRON HCL INJECTION 0.25 MG/5ML
0.2500 mg | Freq: Once | INTRAVENOUS | Status: AC
  Administered 2014-03-05: 0.25 mg via INTRAVENOUS

## 2014-03-05 MED ORDER — PALONOSETRON HCL INJECTION 0.25 MG/5ML
INTRAVENOUS | Status: AC
  Filled 2014-03-05: qty 5

## 2014-03-05 MED ORDER — LORAZEPAM 0.5 MG PO TABS: 0.5000 mg | ORAL_TABLET | Freq: Four times a day (QID) | ORAL | Status: DC | PRN

## 2014-03-05 MED ORDER — SODIUM CHLORIDE 0.9 % IV SOLN
Freq: Once | INTRAVENOUS | Status: AC
  Administered 2014-03-05: 13:00:00 via INTRAVENOUS

## 2014-03-05 NOTE — Assessment & Plan Note (Signed)
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  Recommendation: Since she has only 3 bone metastasis, we have elected to treat her with definitive therapy with neoadjuvant chemotherapy to be followed by surgery and radiation. Additional radiation will be planned to be given to the right head of humerus at the conclusion of treatment.  Today is cycle 1 day 1 of dose dense Adriamycin and Cytoxan. Her echocardiogram was normal. I reviewed her blood work from today. She has elevated blood sugars in instructed her to take more insulin today because of IV Decadron that will be given with chemotherapy and for the next 3 days and she'll be taking oral dexamethasone.  Because of neuropathy, a prescription for chemotherapy and is to rest and Cytoxan followed by Abraxane. Return to clinic in one week for toxicity check and nadir counts

## 2014-03-05 NOTE — Progress Notes (Signed)
Patient Care Team: Jacelyn Pi, MD as PCP - General (Endocrinology)  DIAGNOSIS: Breast cancer of upper-outer quadrant of right female breast   Primary site: Breast (Right)   Staging method: AJCC 7th Edition   Clinical: Stage IIIA (T3, N1, M1) signed by Rulon Eisenmenger, MD on 03/05/2014 11:50 AM   Pathologic: (T3, N1a, M1)   Summary: Stage IIIA (T3, N1a, M1)   SUMMARY OF ONCOLOGIC HISTORY:   Breast cancer of upper-outer quadrant of right female breast   02/02/2014 Mammogram Right breast: 5.8 cm irregular high-density solid mass suggestive of malignancy; left breast next millimeter internal mammary lymph node   02/04/2014 Initial Biopsy  2 biopsies were performed the right breast and axilla: Grade 2 invasive ductal carcinoma ER/PR positive HER-2 negative Ki-67 20% to 47%: Biopsy of right humerus metastatic breast cancer estrogen positive   02/17/2014 PET scan Hypermetabolic right breast cancer and right axillary lymph nodes subpectoral lymph nodes indeterminate right middle lobe pulmonary nodule and hypermetabolic proximal right humerus lesion   03/05/2014 -  Neo-Adjuvant Chemotherapy Dose dense Adriamycin and Cytoxan x4 cycles followed by Abraxane weekly x12    CHIEF COMPLIANT: Followup of PET/CT scan and to start cycle 1 day 1 of dose dense Adriamycin Cytoxan  INTERVAL HISTORY: Tonya Alvarez is a 68 year old Caucasian lady with above-mentioned history of right breast cancer who underwent a PET/CT scan on 02/17/2014 and it revealed a solitary right humerus bone metastases. She had underwent biopsy confirmed that this was breast cancer. Because this is solitary bone lesion, we elected to proceed with the current treatment plan of neoadjuvant chemotherapy with doses of Adriamycin and Cytoxan. She underwent port placement an echocardiogram. Echo revealed an EF of 55-60%. She is anxious to get started with the chemotherapy.  REVIEW OF SYSTEMS:   Constitutional: Denies fevers, chills or abnormal  weight loss Eyes: Denies blurriness of vision Ears, nose, mouth, throat, and face: Denies mucositis or sore throat Respiratory: Denies cough, dyspnea or wheezes Cardiovascular: Denies palpitation, chest discomfort or lower extremity swelling Gastrointestinal:  Denies nausea, heartburn or change in bowel habits Skin: Denies abnormal skin rashes Lymphatics: Denies new lymphadenopathy or easy bruising Neurological:Denies numbness, tingling or new weaknesses Behavioral/Psych: Mood is stable, no new changes  Breast: Masses in the breast All other systems were reviewed with the patient and are negative.  I have reviewed the past medical history, past surgical history, social history and family history with the patient and they are unchanged from previous note.  ALLERGIES:  is allergic to amoxicillin; lisinopril; and oxycodone.  MEDICATIONS:  Current Outpatient Prescriptions  Medication Sig Dispense Refill  . acetaminophen (TYLENOL) 500 MG tablet Take 500 mg by mouth every 6 (six) hours as needed.      Marland Kitchen aspirin 81 MG tablet Take 81 mg by mouth at bedtime.       Marland Kitchen dexamethasone (DECADRON) 4 MG tablet Take 2 tablets by mouth once a day on the day after chemotherapy and then take 2 tablets two times a day for 2 days. Take with food.  30 tablet  1  . fluticasone (FLONASE) 50 MCG/ACT nasal spray Place 1 spray into both nostrils daily as needed for allergies or rhinitis.      Marland Kitchen ibuprofen (ADVIL,MOTRIN) 600 MG tablet Take 600 mg by mouth every 12 (twelve) hours.      . insulin aspart protamine- aspart (NOVOLOG MIX 70/30) (70-30) 100 UNIT/ML injection Inject 60-68 Units into the skin 2 (two) times daily.      Marland Kitchen  lidocaine-prilocaine (EMLA) cream Apply 1 application topically as needed.  30 g  6  . LORazepam (ATIVAN) 0.5 MG tablet Take 1 tablet (0.5 mg total) by mouth every 6 (six) hours as needed (Nausea or vomiting).  30 tablet  0  . losartan (COZAAR) 50 MG tablet Take 50 mg by mouth every morning.       . metFORMIN (GLUCOPHAGE) 500 MG tablet Take 1,000 mg by mouth 2 (two) times daily with a meal.       . Multiple Vitamin (MULTIVITAMIN WITH MINERALS) TABS tablet Take 1 tablet by mouth at bedtime.      . ondansetron (ZOFRAN) 8 MG tablet Take 1 tablet (8 mg total) by mouth 2 (two) times daily as needed. Start on the third day after chemotherapy.  30 tablet  1  . prochlorperazine (COMPAZINE) 10 MG tablet Take 1 tablet (10 mg total) by mouth every 6 (six) hours as needed (Nausea or vomiting).  30 tablet  1  . simvastatin (ZOCOR) 10 MG tablet Take 10 mg by mouth every evening.       Marland Kitchen tiZANidine (ZANAFLEX) 2 MG tablet Take 2 mg by mouth at bedtime.       No current facility-administered medications for this visit.    PHYSICAL EXAMINATION: ECOG PERFORMANCE STATUS: 1 - Symptomatic but completely ambulatory  Filed Vitals:   03/05/14 1114  BP: 157/69  Pulse: 67  Temp: 98.2 F (36.8 C)  Resp: 18   Filed Weights   03/05/14 1114  Weight: 312 lb 9.6 oz (141.794 kg)    GENERAL:alert, no distress and comfortable SKIN: skin color, texture, turgor are normal, no rashes or significant lesions EYES: normal, Conjunctiva are pink and non-injected, sclera clear OROPHARYNX:no exudate, no erythema and lips, buccal mucosa, and tongue normal  NECK: supple, thyroid normal size, non-tender, without nodularity LYMPH:  no palpable lymphadenopathy in the cervical, axillary or inguinal LUNGS: clear to auscultation and percussion with normal breathing effort HEART: regular rate & rhythm and no murmurs and no lower extremity edema ABDOMEN:abdomen soft, non-tender and normal bowel sounds Musculoskeletal:no cyanosis of digits and no clubbing  NEURO: alert & oriented x 3 with fluent speech, no focal motor/sensory deficits  LABORATORY DATA:  I have reviewed the data as listed   Chemistry      Component Value Date/Time   NA 139 03/05/2014 1042   NA 141 02/23/2014 1105   K 4.4 03/05/2014 1042   K 4.6  02/23/2014 1105   CL 99 02/23/2014 1105   CO2 26 03/05/2014 1042   CO2 26 02/23/2014 1105   BUN 14.8 03/05/2014 1042   BUN 14 02/23/2014 1105   CREATININE 0.8 03/05/2014 1042   CREATININE 0.62 02/23/2014 1105      Component Value Date/Time   CALCIUM 9.7 03/05/2014 1042   CALCIUM 10.1 02/23/2014 1105   ALKPHOS 86 03/05/2014 1042   ALKPHOS 74 08/01/2010 1746   AST 14 03/05/2014 1042   AST 26 08/01/2010 1746   ALT 19 03/05/2014 1042   ALT 20 08/01/2010 1746   BILITOT 0.32 03/05/2014 1042   BILITOT 0.4 08/01/2010 1746       Lab Results  Component Value Date   WBC 7.6 03/05/2014   HGB 12.4 03/05/2014   HCT 38.8 03/05/2014   MCV 83.6 03/05/2014   PLT 256 03/05/2014   NEUTROABS 4.9 03/05/2014     RADIOGRAPHIC STUDIES: PET/CT scan showed right humerus solitary bone metastases. 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  Recommendation: Since she has only 3 bone metastasis, we have elected to treat her with definitive therapy with neoadjuvant chemotherapy to be followed by surgery and radiation. Additional radiation will be planned to be given to the right head of humerus at the conclusion of treatment.  Today is cycle 1 day 1 of dose dense Adriamycin and Cytoxan. Her echocardiogram was normal. I reviewed her blood work from today. She has elevated blood sugars in instructed her to take more insulin today because of IV Decadron that will be given with chemotherapy and for the next 3 days and she'll be taking oral dexamethasone.  Because of neuropathy, a prescription for chemotherapy and is to rest and Cytoxan followed by Abraxane. Return to clinic in one week  for toxicity check and nadir counts   No orders of the defined types were placed in this encounter.   The patient has a good understanding of the overall plan. she agrees with it. She will call with any problems that may develop before her next visit here.  I spent 20 minutes counseling the patient face to face. The total time spent in the appointment was 25 minutes and more than 50% was on counseling and review of test results    Rulon Eisenmenger, MD 03/05/2014 11:56 AM

## 2014-03-05 NOTE — Progress Notes (Signed)
Not a candidate for influenza vaccine due to receiving chemotherapy at this time.   1200: spoke with collaborative nurse about chemotherapy medication orders as patient expressed she is receiving a different medicine due to her diabetic neuropathy.  Collaborative read consent signed by provider and patient during today's visit.  Explained to Tonya Alvarez that she will receive Abraxane after completion of four cycles of A/C. 1510: Spoke with patient upon discharge about dexamethasone.  Reports her "head feeling full with the cytoxan".  Instructed for future treatments to ask nurse to run cytoxan slower.

## 2014-03-05 NOTE — Addendum Note (Signed)
Addended by: Renford Dills on: 03/05/2014 12:12 PM   Modules accepted: Medications

## 2014-03-05 NOTE — Telephone Encounter (Signed)
lvm for pt regarding to NOV appt.. °

## 2014-03-05 NOTE — Progress Notes (Signed)
Discharged at 1510, alone, ambulatory in no distress.

## 2014-03-05 NOTE — Patient Instructions (Signed)
[facility] Discharge Instructions for Patients Receiving Chemotherapy  Today you received the following chemotherapy agents Adriamycin, cytoxan.  To help prevent nausea and vomiting after your treatment, we encourage you to take your nausea medication dexamathasone 4 mg take two tabs today and then two tabs twice daily for two days after chemotherapy.  Zofran 8 mg twice a day beginning day three after chemotherapy.  Lorazepam 0.5 mg and compazine 10 mg as needed.    If you develop nausea and vomiting that is not controlled by your nausea medication, call the clinic.   BELOW ARE SYMPTOMS THAT SHOULD BE REPORTED IMMEDIATELY:  *FEVER GREATER THAN 100.5 F  *CHILLS WITH OR WITHOUT FEVER  NAUSEA AND VOMITING THAT IS NOT CONTROLLED WITH YOUR NAUSEA MEDICATION  *UNUSUAL SHORTNESS OF BREATH  *UNUSUAL BRUISING OR BLEEDING  TENDERNESS IN MOUTH AND THROAT WITH OR WITHOUT PRESENCE OF ULCERS  *URINARY PROBLEMS  *BOWEL PROBLEMS  UNUSUAL RASH Items with * indicate a potential emergency and should be followed up as soon as possible.  Feel free to call the clinic you have any questions or concerns. The clinic phone number is [phone].

## 2014-03-06 ENCOUNTER — Ambulatory Visit (HOSPITAL_BASED_OUTPATIENT_CLINIC_OR_DEPARTMENT_OTHER): Payer: Medicare Other

## 2014-03-06 VITALS — BP 137/62 | HR 87 | Temp 98.1°F | Resp 18

## 2014-03-06 DIAGNOSIS — C7951 Secondary malignant neoplasm of bone: Secondary | ICD-10-CM

## 2014-03-06 DIAGNOSIS — C50411 Malignant neoplasm of upper-outer quadrant of right female breast: Secondary | ICD-10-CM

## 2014-03-06 MED ORDER — PEGFILGRASTIM INJECTION 6 MG/0.6ML
6.0000 mg | Freq: Once | SUBCUTANEOUS | Status: AC
  Administered 2014-03-06: 6 mg via SUBCUTANEOUS

## 2014-03-08 ENCOUNTER — Telehealth: Payer: Self-pay | Admitting: *Deleted

## 2014-03-08 ENCOUNTER — Other Ambulatory Visit: Payer: Self-pay | Admitting: *Deleted

## 2014-03-08 NOTE — Telephone Encounter (Signed)
-----   Message from Cherylynn Ridges, RN sent at 03/05/2014  3:39 PM EDT ----- Regarding: Chemotherapy f/u Contact: [phone] Dr, Lindi Adie, 1st A/C, retired Therapist, sports from [place]

## 2014-03-08 NOTE — Telephone Encounter (Signed)
Spoke with patient. Chemo f/u call. Patient states she has had a headache, ever since they hung the cytoxan. Encouraged her to increase hydration and eat well. Call for any problems.

## 2014-03-10 ENCOUNTER — Encounter: Payer: Self-pay | Admitting: Adult Health

## 2014-03-10 ENCOUNTER — Telehealth: Payer: Self-pay | Admitting: Hematology and Oncology

## 2014-03-10 ENCOUNTER — Ambulatory Visit (HOSPITAL_BASED_OUTPATIENT_CLINIC_OR_DEPARTMENT_OTHER): Payer: Medicare Other | Admitting: Adult Health

## 2014-03-10 ENCOUNTER — Other Ambulatory Visit (HOSPITAL_BASED_OUTPATIENT_CLINIC_OR_DEPARTMENT_OTHER): Payer: Medicare Other

## 2014-03-10 VITALS — BP 155/70 | HR 79 | Temp 97.6°F | Resp 20 | Ht 69.5 in | Wt 308.8 lb

## 2014-03-10 DIAGNOSIS — Z794 Long term (current) use of insulin: Secondary | ICD-10-CM

## 2014-03-10 DIAGNOSIS — E119 Type 2 diabetes mellitus without complications: Secondary | ICD-10-CM

## 2014-03-10 DIAGNOSIS — C50411 Malignant neoplasm of upper-outer quadrant of right female breast: Secondary | ICD-10-CM

## 2014-03-10 DIAGNOSIS — C7951 Secondary malignant neoplasm of bone: Secondary | ICD-10-CM

## 2014-03-10 LAB — CBC WITH DIFFERENTIAL/PLATELET
BASO%: 0.4 % (ref 0.0–2.0)
Basophils Absolute: 0 10*3/uL (ref 0.0–0.1)
EOS%: 3.4 % (ref 0.0–7.0)
Eosinophils Absolute: 0.1 10*3/uL (ref 0.0–0.5)
HEMATOCRIT: 37.9 % (ref 34.8–46.6)
HGB: 12 g/dL (ref 11.6–15.9)
LYMPH%: 24.1 % (ref 14.0–49.7)
MCH: 26.6 pg (ref 25.1–34.0)
MCHC: 31.7 g/dL (ref 31.5–36.0)
MCV: 84.1 fL (ref 79.5–101.0)
MONO#: 0.1 10*3/uL (ref 0.1–0.9)
MONO%: 1.5 % (ref 0.0–14.0)
NEUT#: 2.6 10*3/uL (ref 1.5–6.5)
NEUT%: 70.6 % (ref 38.4–76.8)
Platelets: 180 10*3/uL (ref 145–400)
RBC: 4.51 10*6/uL (ref 3.70–5.45)
RDW: 16.9 % — AB (ref 11.2–14.5)
WBC: 3.6 10*3/uL — AB (ref 3.9–10.3)
lymph#: 0.9 10*3/uL (ref 0.9–3.3)

## 2014-03-10 LAB — COMPREHENSIVE METABOLIC PANEL (CC13)
ALT: 13 U/L (ref 0–55)
ANION GAP: 10 meq/L (ref 3–11)
AST: 11 U/L (ref 5–34)
Albumin: 3.2 g/dL — ABNORMAL LOW (ref 3.5–5.0)
Alkaline Phosphatase: 95 U/L (ref 40–150)
BILIRUBIN TOTAL: 0.38 mg/dL (ref 0.20–1.20)
BUN: 22.4 mg/dL (ref 7.0–26.0)
CHLORIDE: 102 meq/L (ref 98–109)
CO2: 25 mEq/L (ref 22–29)
CREATININE: 0.8 mg/dL (ref 0.6–1.1)
Calcium: 8.9 mg/dL (ref 8.4–10.4)
Glucose: 296 mg/dl — ABNORMAL HIGH (ref 70–140)
Potassium: 4.3 mEq/L (ref 3.5–5.1)
Sodium: 138 mEq/L (ref 136–145)
TOTAL PROTEIN: 6 g/dL — AB (ref 6.4–8.3)

## 2014-03-10 NOTE — Telephone Encounter (Signed)
, °

## 2014-03-10 NOTE — Progress Notes (Signed)
Patient Care Team: Jacelyn Pi, MD as PCP - General (Endocrinology)  DIAGNOSIS: Breast cancer of upper-outer quadrant of right female breast   Primary site: Breast (Right)   Staging method: AJCC 7th Edition   Clinical: Stage IIIA (T3, N1, M1) signed by Rulon Eisenmenger, MD on 03/05/2014 11:50 AM   Pathologic: (T3, N1a, M1)   Summary: Stage IIIA (T3, N1a, M1)   SUMMARY OF ONCOLOGIC HISTORY:   Breast cancer of upper-outer quadrant of right female breast   02/02/2014 Mammogram Right breast: 5.8 cm irregular high-density solid mass suggestive of malignancy; left breast next millimeter internal mammary lymph node   02/04/2014 Initial Biopsy  2 biopsies were performed the right breast and axilla: Grade 2 invasive ductal carcinoma ER/PR positive HER-2 negative Ki-67 20% to 47%: Biopsy of right humerus metastatic breast cancer estrogen positive   02/17/2014 PET scan Hypermetabolic right breast cancer and right axillary lymph nodes subpectoral lymph nodes indeterminate right middle lobe pulmonary nodule and hypermetabolic proximal right humerus lesion   03/05/2014 -  Neo-Adjuvant Chemotherapy Dose dense Adriamycin and Cytoxan x4 cycles followed by Abraxane weekly x12    CHIEF COMPLIANT: Followup of PET/CT scan and to start cycle 1 day 8 of dose dense Adriamycin Cytoxan  INTERVAL HISTORY: Tonya Alvarez is a 68 year old Caucasian lady with above-mentioned history of right breast cancer.  She is here for follow up after receiving her first cycle of Adriamcyin and Cytoxan.  She did tolerate it relatively well.  Her main concern is fatigue, along with the fact that her blood glucose was higher in the upper 300s following chemotherapy.  She has diabetes and manages it with insulin and metformin and wants to know what is causing the increase.  Otherwise, she denies fevers, chills, nausea, vomiting, constipation,idarrhea, numbness/tingling or any further concerns.   REVIEW OF SYSTEMS:   A 10 point review of  systems was conducted and is otherwise negative except for what is noted above.     I have reviewed the past medical history, past surgical history, social history and family history with the patient and they are unchanged from previous note.  ALLERGIES:  is allergic to amoxicillin; lisinopril; and oxycodone.  MEDICATIONS:  Current Outpatient Prescriptions  Medication Sig Dispense Refill  . aspirin 81 MG tablet Take 81 mg by mouth at bedtime.     Marland Kitchen dexamethasone (DECADRON) 4 MG tablet Take 2 tablets by mouth once a day on the day after chemotherapy and then take 2 tablets two times a day for 2 days. Take with food. 30 tablet 1  . fluticasone (FLONASE) 50 MCG/ACT nasal spray Place 1 spray into both nostrils daily as needed for allergies or rhinitis.    Marland Kitchen ibuprofen (ADVIL,MOTRIN) 600 MG tablet Take 600 mg by mouth every 12 (twelve) hours.    . insulin aspart protamine- aspart (NOVOLOG MIX 70/30) (70-30) 100 UNIT/ML injection Inject 60-68 Units into the skin 2 (two) times daily.    Marland Kitchen losartan (COZAAR) 50 MG tablet Take 50 mg by mouth every morning.    . metFORMIN (GLUCOPHAGE) 500 MG tablet Take 1,000 mg by mouth 2 (two) times daily with a meal.     . Multiple Vitamin (MULTIVITAMIN WITH MINERALS) TABS tablet Take 1 tablet by mouth at bedtime.    . ondansetron (ZOFRAN) 8 MG tablet Take 1 tablet (8 mg total) by mouth 2 (two) times daily as needed. Start on the third day after chemotherapy. 30 tablet 1  . prochlorperazine (COMPAZINE) 10 MG tablet  Take 1 tablet (10 mg total) by mouth every 6 (six) hours as needed (Nausea or vomiting). 30 tablet 1  . simvastatin (ZOCOR) 10 MG tablet Take 10 mg by mouth every evening.     Marland Kitchen tiZANidine (ZANAFLEX) 2 MG tablet Take 2 mg by mouth at bedtime. Pt takes this medication as needed    . acetaminophen (TYLENOL) 500 MG tablet Take 500 mg by mouth every 6 (six) hours as needed.    . lidocaine-prilocaine (EMLA) cream Apply 1 application topically as needed. 30 g 6   . LORazepam (ATIVAN) 0.5 MG tablet Take 1 tablet (0.5 mg total) by mouth every 6 (six) hours as needed (Nausea or vomiting). 30 tablet 0   No current facility-administered medications for this visit.    PHYSICAL EXAMINATION: ECOG PERFORMANCE STATUS: 1 - Symptomatic but completely ambulatory  Filed Vitals:   03/10/14 1119  BP: 155/70  Pulse: 79  Temp: 97.6 F (36.4 C)  Resp: 20   Filed Weights   03/10/14 1119  Weight: 308 lb 12.8 oz (140.071 kg)   GENERAL: Patient is a well appearing morbidly obese female in no acute distress HEENT:  Sclerae anicteric.  Oropharynx clear and moist. No ulcerations or evidence of oropharyngeal candidiasis. Neck is supple.  NODES:  No cervical, supraclavicular, or axillary lymphadenopathy palpated.  BREAST EXAM:  Deferred. LUNGS:  Clear to auscultation bilaterally.  No wheezes or rhonchi. HEART:  Regular rate and rhythm. No murmur appreciated. ABDOMEN:  Soft, nontender.  Positive, normoactive bowel sounds. No organomegaly palpated. MSK:  No focal spinal tenderness to palpation. Full range of motion bilaterally in the upper extremities. EXTREMITIES:  No peripheral edema.   SKIN:  Clear with no obvious rashes or skin changes. No nail dyscrasia. NEURO:  Nonfocal. Well oriented.  Appropriate affect.   LABORATORY DATA:  I have reviewed the data as listed   Chemistry      Component Value Date/Time   NA 139 03/05/2014 1042   NA 141 02/23/2014 1105   K 4.4 03/05/2014 1042   K 4.6 02/23/2014 1105   CL 99 02/23/2014 1105   CO2 26 03/05/2014 1042   CO2 26 02/23/2014 1105   BUN 14.8 03/05/2014 1042   BUN 14 02/23/2014 1105   CREATININE 0.8 03/05/2014 1042   CREATININE 0.62 02/23/2014 1105      Component Value Date/Time   CALCIUM 9.7 03/05/2014 1042   CALCIUM 10.1 02/23/2014 1105   ALKPHOS 86 03/05/2014 1042   ALKPHOS 74 08/01/2010 1746   AST 14 03/05/2014 1042   AST 26 08/01/2010 1746   ALT 19 03/05/2014 1042   ALT 20 08/01/2010 1746    BILITOT 0.32 03/05/2014 1042   BILITOT 0.4 08/01/2010 1746       Lab Results  Component Value Date   WBC 3.6* 03/10/2014   HGB 12.0 03/10/2014   HCT 37.9 03/10/2014   MCV 84.1 03/10/2014   PLT 180 03/10/2014   NEUTROABS 2.6 03/10/2014     RADIOGRAPHIC STUDIES: PET/CT scan showed right humerus solitary bone metastases. Small lung nodule that is indeterminate  ASSESSMENT: 68 year old woman with T3, N1, M1, stage IV invasive ductal carcinoma, ER 90%, PR 80%, Ki-67 (unable to locate), HER-2/neu pending.    1. Patient was started on neoadjuvant Adriamycin and Cytoxan on 03/05/14.  A total of 4 cycles are planned.  This is to be followed by 12 cycles of weekly Abraxane.     PLAN:   Juna is doing well today.  She tolerated chemotherapy moderately well.  I reviewed her labs with her and they are stable.  I am concerned about her hyperglycemia following treatment.  Due to this I have decreased her dexamethasone pre chemotherapy to 33m IV.  She will only take Dexamethasone 42mPO daily for three days following chemotherapy.  She otherwise tolerated the treatment relatively well.  KaAnellaill return in one week for labs, follow up and cycle 2 of Adriamycin and Cytoxan.    The patient has a good understanding of the overall plan. she agrees with it. She will call with any problems that may develop before her next visit here.  I spent 25 minutes counseling the patient face to face. The total time spent in the appointment was 30 minutes and more than 50% was on counseling and review of test results.   LiMinette H[phone]/08/2013 11:55 AM

## 2014-03-11 ENCOUNTER — Telehealth: Payer: Self-pay | Admitting: *Deleted

## 2014-03-11 NOTE — Telephone Encounter (Signed)
Per staff message and POF I have scheduled appts. Advised scheduler of appts. JMW  

## 2014-03-15 ENCOUNTER — Telehealth: Payer: Self-pay

## 2014-03-15 NOTE — Telephone Encounter (Signed)
Final path report rcvd from [facility] 02/04/14,  Copy to Dr Lindi Adie.  Original to scan.

## 2014-03-19 ENCOUNTER — Ambulatory Visit (HOSPITAL_BASED_OUTPATIENT_CLINIC_OR_DEPARTMENT_OTHER): Payer: Medicare Other | Admitting: Adult Health

## 2014-03-19 ENCOUNTER — Ambulatory Visit (HOSPITAL_BASED_OUTPATIENT_CLINIC_OR_DEPARTMENT_OTHER): Payer: Medicare Other

## 2014-03-19 ENCOUNTER — Other Ambulatory Visit (HOSPITAL_BASED_OUTPATIENT_CLINIC_OR_DEPARTMENT_OTHER): Payer: Medicare Other

## 2014-03-19 ENCOUNTER — Other Ambulatory Visit: Payer: Medicare Other

## 2014-03-19 VITALS — BP 137/64 | HR 70 | Temp 98.0°F | Resp 18 | Ht 69.5 in | Wt 310.5 lb

## 2014-03-19 DIAGNOSIS — Z170 Estrogen receptor positive status [ER+]: Secondary | ICD-10-CM

## 2014-03-19 DIAGNOSIS — E119 Type 2 diabetes mellitus without complications: Secondary | ICD-10-CM

## 2014-03-19 DIAGNOSIS — C50411 Malignant neoplasm of upper-outer quadrant of right female breast: Secondary | ICD-10-CM

## 2014-03-19 DIAGNOSIS — Z5111 Encounter for antineoplastic chemotherapy: Secondary | ICD-10-CM

## 2014-03-19 DIAGNOSIS — C50919 Malignant neoplasm of unspecified site of unspecified female breast: Secondary | ICD-10-CM

## 2014-03-19 DIAGNOSIS — C7951 Secondary malignant neoplasm of bone: Secondary | ICD-10-CM

## 2014-03-19 LAB — COMPREHENSIVE METABOLIC PANEL (CC13)
ALK PHOS: 83 U/L (ref 40–150)
ALT: 20 U/L (ref 0–55)
AST: 14 U/L (ref 5–34)
Albumin: 3.6 g/dL (ref 3.5–5.0)
Anion Gap: 9 mEq/L (ref 3–11)
BUN: 12.2 mg/dL (ref 7.0–26.0)
CO2: 27 mEq/L (ref 22–29)
CREATININE: 0.8 mg/dL (ref 0.6–1.1)
Calcium: 9.5 mg/dL (ref 8.4–10.4)
Chloride: 105 mEq/L (ref 98–109)
Glucose: 164 mg/dl — ABNORMAL HIGH (ref 70–140)
Potassium: 4.6 mEq/L (ref 3.5–5.1)
Sodium: 141 mEq/L (ref 136–145)
Total Bilirubin: 0.23 mg/dL (ref 0.20–1.20)
Total Protein: 6.5 g/dL (ref 6.4–8.3)

## 2014-03-19 LAB — CBC WITH DIFFERENTIAL/PLATELET
BASO%: 0.5 % (ref 0.0–2.0)
BASOS ABS: 0 10*3/uL (ref 0.0–0.1)
EOS%: 2.2 % (ref 0.0–7.0)
Eosinophils Absolute: 0.1 10*3/uL (ref 0.0–0.5)
HEMATOCRIT: 39.4 % (ref 34.8–46.6)
HEMOGLOBIN: 12.5 g/dL (ref 11.6–15.9)
LYMPH%: 28.6 % (ref 14.0–49.7)
MCH: 26.5 pg (ref 25.1–34.0)
MCHC: 31.7 g/dL (ref 31.5–36.0)
MCV: 83.7 fL (ref 79.5–101.0)
MONO#: 0.5 10*3/uL (ref 0.1–0.9)
MONO%: 8.7 % (ref 0.0–14.0)
NEUT#: 3.7 10*3/uL (ref 1.5–6.5)
NEUT%: 60 % (ref 38.4–76.8)
Platelets: 197 10*3/uL (ref 145–400)
RBC: 4.71 10*6/uL (ref 3.70–5.45)
RDW: 16.5 % — ABNORMAL HIGH (ref 11.2–14.5)
WBC: 6.2 10*3/uL (ref 3.9–10.3)
lymph#: 1.8 10*3/uL (ref 0.9–3.3)

## 2014-03-19 LAB — TECHNOLOGIST REVIEW

## 2014-03-19 MED ORDER — PALONOSETRON HCL INJECTION 0.25 MG/5ML
0.2500 mg | Freq: Once | INTRAVENOUS | Status: AC
  Administered 2014-03-19: 0.25 mg via INTRAVENOUS

## 2014-03-19 MED ORDER — DOXORUBICIN HCL CHEMO IV INJECTION 2 MG/ML
60.0000 mg/m2 | Freq: Once | INTRAVENOUS | Status: AC
Start: 2014-03-19 — End: 2014-03-19
  Administered 2014-03-19: 158 mg via INTRAVENOUS
  Filled 2014-03-19: qty 79

## 2014-03-19 MED ORDER — SODIUM CHLORIDE 0.9 % IV SOLN
150.0000 mg | Freq: Once | INTRAVENOUS | Status: AC
  Administered 2014-03-19: 150 mg via INTRAVENOUS
  Filled 2014-03-19: qty 5

## 2014-03-19 MED ORDER — SODIUM CHLORIDE 0.9 % IV SOLN
600.0000 mg/m2 | Freq: Once | INTRAVENOUS | Status: AC
  Administered 2014-03-19: 1580 mg via INTRAVENOUS
  Filled 2014-03-19: qty 79

## 2014-03-19 MED ORDER — DEXAMETHASONE SODIUM PHOSPHATE 10 MG/ML IJ SOLN
INTRAMUSCULAR | Status: AC
  Filled 2014-03-19: qty 1

## 2014-03-19 MED ORDER — PALONOSETRON HCL INJECTION 0.25 MG/5ML
INTRAVENOUS | Status: AC
  Filled 2014-03-19: qty 5

## 2014-03-19 MED ORDER — SODIUM CHLORIDE 0.9 % IJ SOLN
10.0000 mL | INTRAMUSCULAR | Status: DC | PRN
  Administered 2014-03-19: 10 mL
  Filled 2014-03-19: qty 10

## 2014-03-19 MED ORDER — HEPARIN SOD (PORK) LOCK FLUSH 100 UNIT/ML IV SOLN
500.0000 [IU] | Freq: Once | INTRAVENOUS | Status: AC | PRN
  Administered 2014-03-19: 500 [IU]
  Filled 2014-03-19: qty 5

## 2014-03-19 MED ORDER — SODIUM CHLORIDE 0.9 % IV SOLN
Freq: Once | INTRAVENOUS | Status: AC
  Administered 2014-03-19: 10:00:00 via INTRAVENOUS

## 2014-03-19 MED ORDER — DEXAMETHASONE SODIUM PHOSPHATE 10 MG/ML IJ SOLN
6.0000 mg | Freq: Once | INTRAMUSCULAR | Status: AC
  Administered 2014-03-19: 6 mg via INTRAVENOUS

## 2014-03-19 NOTE — Progress Notes (Signed)
Patient Care Team: Jacelyn Pi, MD as PCP - General (Endocrinology)  DIAGNOSIS: Breast cancer of upper-outer quadrant of right female breast   Primary site: Breast (Right)   Staging method: AJCC 7th Edition   Clinical: Stage IIIA (T3, N1, M1) signed by Rulon Eisenmenger, MD on 03/05/2014 11:50 AM   Pathologic: (T3, N1a, M1)   Summary: Stage IIIA (T3, N1a, M1)   SUMMARY OF ONCOLOGIC HISTORY:   Breast cancer of upper-outer quadrant of right female breast   02/02/2014 Mammogram Right breast: 5.8 cm irregular high-density solid mass suggestive of malignancy; left breast next millimeter internal mammary lymph node   02/04/2014 Initial Biopsy  2 biopsies were performed the right breast and axilla: Grade 2 invasive ductal carcinoma ER/PR positive HER-2 negative Ki-67 20% to 47%: Biopsy of right humerus metastatic breast cancer estrogen positive   02/17/2014 PET scan Hypermetabolic right breast cancer and right axillary lymph nodes subpectoral lymph nodes indeterminate right middle lobe pulmonary nodule and hypermetabolic proximal right humerus lesion   03/05/2014 -  Neo-Adjuvant Chemotherapy Dose dense Adriamycin and Cytoxan x4 cycles followed by Abraxane weekly x12    CHIEF COMPLIANT:  Doxorubicin and Cyclophosphamide cycle 2 day 1  INTERVAL HISTORY: Tonya Alvarez is a 68 year old Caucasian lady with above-mentioned history of right breast cancer.  She is here for evaluation prior to receiving cycle 2 of chemotherapy.  She is doing well today.  Her blood glucose is much improved.  She denies fevers, chills, nausea, vomiting, constipation, diarrhea.  She does have numbness in her feet.  This is chronic and due to her diabetes and has been longstanding for several years.    REVIEW OF SYSTEMS:   A 10 point review of systems was conducted and is otherwise negative except for what is noted above.     I have reviewed the past medical history, past surgical history, social history and family history  with the patient and they are unchanged from previous note.  ALLERGIES:  is allergic to amoxicillin; lisinopril; and oxycodone.  MEDICATIONS:  Current Outpatient Prescriptions  Medication Sig Dispense Refill  . aspirin 81 MG tablet Take 81 mg by mouth at bedtime.     Marland Kitchen dexamethasone (DECADRON) 4 MG tablet Take 2 tablets by mouth once a day on the day after chemotherapy and then take 2 tablets two times a day for 2 days. Take with food. 30 tablet 1  . fluticasone (FLONASE) 50 MCG/ACT nasal spray Place 1 spray into both nostrils daily as needed for allergies or rhinitis.    Marland Kitchen ibuprofen (ADVIL,MOTRIN) 600 MG tablet Take 600 mg by mouth every 12 (twelve) hours.    . insulin aspart protamine- aspart (NOVOLOG MIX 70/30) (70-30) 100 UNIT/ML injection Inject 60-68 Units into the skin 2 (two) times daily.    Marland Kitchen lidocaine-prilocaine (EMLA) cream Apply 1 application topically as needed. 30 g 6  . LORazepam (ATIVAN) 0.5 MG tablet Take 1 tablet (0.5 mg total) by mouth every 6 (six) hours as needed (Nausea or vomiting). 30 tablet 0  . losartan (COZAAR) 50 MG tablet Take 50 mg by mouth every morning.    . metFORMIN (GLUCOPHAGE) 500 MG tablet Take 1,000 mg by mouth 2 (two) times daily with a meal.     . Multiple Vitamin (MULTIVITAMIN WITH MINERALS) TABS tablet Take 1 tablet by mouth at bedtime.    . ondansetron (ZOFRAN) 8 MG tablet Take 1 tablet (8 mg total) by mouth 2 (two) times daily as needed. Start on the  third day after chemotherapy. 30 tablet 1  . simvastatin (ZOCOR) 10 MG tablet Take 10 mg by mouth every evening.     Marland Kitchen tiZANidine (ZANAFLEX) 2 MG tablet Take 2 mg by mouth at bedtime. Pt takes this medication as needed    . acetaminophen (TYLENOL) 500 MG tablet Take 500 mg by mouth every 6 (six) hours as needed.    . prochlorperazine (COMPAZINE) 10 MG tablet Take 1 tablet (10 mg total) by mouth every 6 (six) hours as needed (Nausea or vomiting). 30 tablet 1   No current facility-administered medications  for this visit.    PHYSICAL EXAMINATION: ECOG PERFORMANCE STATUS: 1 - Symptomatic but completely ambulatory  Filed Vitals:   03/19/14 0913  BP: 137/64  Pulse: 70  Temp: 98 F (36.7 C)  Resp: 18   Filed Weights   03/19/14 0913  Weight: 310 lb 8 oz (140.842 kg)   GENERAL: Patient is a well appearing morbidly obese female in no acute distress HEENT:  Sclerae anicteric.  Oropharynx clear and moist. No ulcerations or evidence of oropharyngeal candidiasis. Neck is supple.  NODES:  No cervical, supraclavicular, or axillary lymphadenopathy palpated.  BREAST EXAM:  Deferred. LUNGS:  Clear to auscultation bilaterally.  No wheezes or rhonchi. HEART:  Regular rate and rhythm. No murmur appreciated. ABDOMEN:  Soft, nontender.  Positive, normoactive bowel sounds. No organomegaly palpated. MSK:  No focal spinal tenderness to palpation. Full range of motion bilaterally in the upper extremities. EXTREMITIES:  No peripheral edema.   SKIN:  Clear with no obvious rashes or skin changes. No nail dyscrasia. NEURO:  Nonfocal. Well oriented.  Appropriate affect.   LABORATORY DATA:  I have reviewed the data as listed   Chemistry      Component Value Date/Time   NA 138 03/10/2014 1101   NA 141 02/23/2014 1105   K 4.3 03/10/2014 1101   K 4.6 02/23/2014 1105   CL 99 02/23/2014 1105   CO2 25 03/10/2014 1101   CO2 26 02/23/2014 1105   BUN 22.4 03/10/2014 1101   BUN 14 02/23/2014 1105   CREATININE 0.8 03/10/2014 1101   CREATININE 0.62 02/23/2014 1105      Component Value Date/Time   CALCIUM 8.9 03/10/2014 1101   CALCIUM 10.1 02/23/2014 1105   ALKPHOS 95 03/10/2014 1101   ALKPHOS 74 08/01/2010 1746   AST 11 03/10/2014 1101   AST 26 08/01/2010 1746   ALT 13 03/10/2014 1101   ALT 20 08/01/2010 1746   BILITOT 0.38 03/10/2014 1101   BILITOT 0.4 08/01/2010 1746       Lab Results  Component Value Date   WBC 6.2 03/19/2014   HGB 12.5 03/19/2014   HCT 39.4 03/19/2014   MCV 83.7 03/19/2014    PLT 197 03/19/2014   NEUTROABS 3.7 03/19/2014     RADIOGRAPHIC STUDIES: PET/CT scan showed right humerus solitary bone metastases. Small lung nodule that is indeterminate  ASSESSMENT: 68 year old woman with T3, N1, M1, stage IV invasive ductal carcinoma, ER 90%, PR 80%, Ki-67 (unable to locate), HER-2/neu pending.    1. Patient was started on neoadjuvant Doxorubicin and Cyclophosphamide on 03/05/14.  A total of 4 cycles are planned.  This is to be followed by 12 cycles of weekly Abraxane.     PLAN:  Tonya Alvarez is doing well today.  She will proceed with cycle 2 of chemotherapy today with Doxorubicin and Cyclophosphamide.  Her CBC is stable I reviewed this with her in detail.  A CMP  is pending.  Archer's Dexamethasone has been dose reduced due to her diabetes.  She will contact us if her blood glucose gets above 350.  She is working with Dr. Rayford Halsted and covering herself with extra insulin as well.    Vietta will return in one week for labs and evaluation.  The patient has a good understanding of the overall plan. she agrees with it. She will call with any problems that may develop before her next visit here.  I spent 25 minutes counseling the patient face to face. The total time spent in the appointment was 30 minutes and more than 50% was on counseling and review of test results.   Minette Headland, [place] [phone] 03/19/2014 9:20 AM

## 2014-03-19 NOTE — Patient Instructions (Signed)
[facility] Discharge Instructions for Patients Receiving Chemotherapy  Today you received the following chemotherapy agents Adriamycin, cytoxan.  To help prevent nausea and vomiting after your treatment, we encourage you to take your nausea medication dexamathasone 4 mg take two tabs today and then two tabs twice daily for two days after chemotherapy.  Zofran 8 mg twice a day beginning day three after chemotherapy.  Lorazepam 0.5 mg and compazine 10 mg as needed.    If you develop nausea and vomiting that is not controlled by your nausea medication, call the clinic.   BELOW ARE SYMPTOMS THAT SHOULD BE REPORTED IMMEDIATELY:  *FEVER GREATER THAN 100.5 F  *CHILLS WITH OR WITHOUT FEVER  NAUSEA AND VOMITING THAT IS NOT CONTROLLED WITH YOUR NAUSEA MEDICATION  *UNUSUAL SHORTNESS OF BREATH  *UNUSUAL BRUISING OR BLEEDING  TENDERNESS IN MOUTH AND THROAT WITH OR WITHOUT PRESENCE OF ULCERS  *URINARY PROBLEMS  *BOWEL PROBLEMS  UNUSUAL RASH Items with * indicate a potential emergency and should be followed up as soon as possible.  Feel free to call the clinic you have any questions or concerns. The clinic phone number is [phone].

## 2014-03-20 ENCOUNTER — Ambulatory Visit (HOSPITAL_BASED_OUTPATIENT_CLINIC_OR_DEPARTMENT_OTHER): Payer: Medicare Other

## 2014-03-20 DIAGNOSIS — Z5189 Encounter for other specified aftercare: Secondary | ICD-10-CM

## 2014-03-20 DIAGNOSIS — C50411 Malignant neoplasm of upper-outer quadrant of right female breast: Secondary | ICD-10-CM

## 2014-03-20 DIAGNOSIS — C7951 Secondary malignant neoplasm of bone: Secondary | ICD-10-CM

## 2014-03-20 MED ORDER — PEGFILGRASTIM INJECTION 6 MG/0.6ML ~~LOC~~
6.0000 mg | PREFILLED_SYRINGE | Freq: Once | SUBCUTANEOUS | Status: AC
  Administered 2014-03-20: 6 mg via SUBCUTANEOUS

## 2014-03-20 NOTE — Patient Instructions (Signed)
Pegfilgrastim injection What is this medicine? PEGFILGRASTIM (peg fil GRA stim) is a long-acting granulocyte colony-stimulating factor that stimulates the growth of neutrophils, a type of white blood cell important in the body's fight against infection. It is used to reduce the incidence of fever and infection in patients with certain types of cancer who are receiving chemotherapy that affects the bone marrow. This medicine may be used for other purposes; ask your health care provider or pharmacist if you have questions. COMMON BRAND NAME(S): Neulasta What should I tell my health care provider before I take this medicine? They need to know if you have any of these conditions: -latex allergy -ongoing radiation therapy -sickle cell disease -skin reactions to acrylic adhesives (On-Body Injector only) -an unusual or allergic reaction to pegfilgrastim, filgrastim, other medicines, foods, dyes, or preservatives -pregnant or trying to get pregnant -breast-feeding How should I use this medicine? This medicine is for injection under the skin. If you get this medicine at home, you will be taught how to prepare and give the pre-filled syringe or how to use the On-body Injector. Refer to the patient Instructions for Use for detailed instructions. Use exactly as directed. Take your medicine at regular intervals. Do not take your medicine more often than directed. It is important that you put your used needles and syringes in a special sharps container. Do not put them in a trash can. If you do not have a sharps container, call your pharmacist or healthcare provider to get one. Talk to your pediatrician regarding the use of this medicine in children. Special care may be needed. Overdosage: If you think you have taken too much of this medicine contact a poison control center or emergency room at once. NOTE: This medicine is only for you. Do not share this medicine with others. What if I miss a dose? It is  important not to miss your dose. Call your doctor or health care professional if you miss your dose. If you miss a dose due to an On-body Injector failure or leakage, a new dose should be administered as soon as possible using a single prefilled syringe for manual use. What may interact with this medicine? Interactions have not been studied. Give your health care provider a list of all the medicines, herbs, non-prescription drugs, or dietary supplements you use. Also tell them if you smoke, drink alcohol, or use illegal drugs. Some items may interact with your medicine. This list may not describe all possible interactions. Give your health care provider a list of all the medicines, herbs, non-prescription drugs, or dietary supplements you use. Also tell them if you smoke, drink alcohol, or use illegal drugs. Some items may interact with your medicine. What should I watch for while using this medicine? You may need blood work done while you are taking this medicine. If you are going to need a MRI, CT scan, or other procedure, tell your doctor that you are using this medicine (On-Body Injector only). What side effects may I notice from receiving this medicine? Side effects that you should report to your doctor or health care professional as soon as possible: -allergic reactions like skin rash, itching or hives, swelling of the face, lips, or tongue -dizziness -fever -pain, redness, or irritation at site where injected -pinpoint red spots on the skin -shortness of breath or breathing problems -stomach or side pain, or pain at the shoulder -swelling -tiredness -trouble passing urine Side effects that usually do not require medical attention (report to your doctor   or health care professional if they continue or are bothersome): -bone pain -muscle pain This list may not describe all possible side effects. Call your doctor for medical advice about side effects. You may report side effects to FDA at  1-800-FDA-1088. Where should I keep my medicine? Keep out of the reach of children. Store pre-filled syringes in a refrigerator between 2 and 8 degrees C (36 and 46 degrees F). Do not freeze. Keep in carton to protect from light. Throw away this medicine if it is left out of the refrigerator for more than 48 hours. Throw away any unused medicine after the expiration date. NOTE: This sheet is a summary. It may not cover all possible information. If you have questions about this medicine, talk to your doctor, pharmacist, or health care provider.  2015, [employer]. (2013-07-23 16:14:05)  

## 2014-03-26 ENCOUNTER — Ambulatory Visit (HOSPITAL_BASED_OUTPATIENT_CLINIC_OR_DEPARTMENT_OTHER): Payer: Medicare Other | Admitting: Adult Health

## 2014-03-26 ENCOUNTER — Other Ambulatory Visit (HOSPITAL_BASED_OUTPATIENT_CLINIC_OR_DEPARTMENT_OTHER): Payer: Medicare Other

## 2014-03-26 ENCOUNTER — Encounter: Payer: Self-pay | Admitting: Adult Health

## 2014-03-26 VITALS — BP 130/78 | HR 83 | Temp 98.1°F | Resp 20 | Ht 69.5 in | Wt 307.9 lb

## 2014-03-26 DIAGNOSIS — R911 Solitary pulmonary nodule: Secondary | ICD-10-CM

## 2014-03-26 DIAGNOSIS — C7951 Secondary malignant neoplasm of bone: Secondary | ICD-10-CM

## 2014-03-26 DIAGNOSIS — C50919 Malignant neoplasm of unspecified site of unspecified female breast: Secondary | ICD-10-CM

## 2014-03-26 DIAGNOSIS — C50111 Malignant neoplasm of central portion of right female breast: Secondary | ICD-10-CM

## 2014-03-26 DIAGNOSIS — Z170 Estrogen receptor positive status [ER+]: Secondary | ICD-10-CM

## 2014-03-26 DIAGNOSIS — C50411 Malignant neoplasm of upper-outer quadrant of right female breast: Secondary | ICD-10-CM

## 2014-03-26 LAB — CBC WITH DIFFERENTIAL/PLATELET
BASO%: 3.7 % — ABNORMAL HIGH (ref 0.0–2.0)
BASOS ABS: 0.1 10*3/uL (ref 0.0–0.1)
EOS ABS: 0.1 10*3/uL (ref 0.0–0.5)
EOS%: 1.5 % (ref 0.0–7.0)
HCT: 37 % (ref 34.8–46.6)
HGB: 12 g/dL (ref 11.6–15.9)
LYMPH%: 33.6 % (ref 14.0–49.7)
MCH: 27.3 pg (ref 25.1–34.0)
MCHC: 32.4 g/dL (ref 31.5–36.0)
MCV: 84.3 fL (ref 79.5–101.0)
MONO#: 0.5 10*3/uL (ref 0.1–0.9)
MONO%: 14.8 % — AB (ref 0.0–14.0)
NEUT%: 46.4 % (ref 38.4–76.8)
NEUTROS ABS: 1.5 10*3/uL (ref 1.5–6.5)
PLATELETS: 203 10*3/uL (ref 145–400)
RBC: 4.39 10*6/uL (ref 3.70–5.45)
RDW: 16.4 % — ABNORMAL HIGH (ref 11.2–14.5)
WBC: 3.2 10*3/uL — ABNORMAL LOW (ref 3.9–10.3)
lymph#: 1.1 10*3/uL (ref 0.9–3.3)

## 2014-03-26 LAB — COMPREHENSIVE METABOLIC PANEL (CC13)
ALK PHOS: 114 U/L (ref 40–150)
ALT: 13 U/L (ref 0–55)
AST: 9 U/L (ref 5–34)
Albumin: 3.6 g/dL (ref 3.5–5.0)
Anion Gap: 10 mEq/L (ref 3–11)
BILIRUBIN TOTAL: 0.22 mg/dL (ref 0.20–1.20)
BUN: 18.4 mg/dL (ref 7.0–26.0)
CO2: 26 mEq/L (ref 22–29)
Calcium: 9.4 mg/dL (ref 8.4–10.4)
Chloride: 105 mEq/L (ref 98–109)
Creatinine: 0.8 mg/dL (ref 0.6–1.1)
Glucose: 206 mg/dl — ABNORMAL HIGH (ref 70–140)
Potassium: 4.5 mEq/L (ref 3.5–5.1)
Sodium: 141 mEq/L (ref 136–145)
Total Protein: 6.4 g/dL (ref 6.4–8.3)

## 2014-03-26 NOTE — Patient Instructions (Signed)
flexitol heel balm.  Twice a day.

## 2014-03-26 NOTE — Progress Notes (Signed)
Patient Care Team: Jacelyn Pi, MD as PCP - General (Endocrinology)  DIAGNOSIS: Breast cancer of upper-outer quadrant of right female breast   Primary site: Breast (Right)   Staging method: AJCC 7th Edition   Clinical: Stage IV (T3, N1, M1) signed by Rulon Eisenmenger, MD on 03/05/2014 11:50 AM   Pathologic: (T3, N1a, M1)   Summary: Stage IV (T3, N1a, M1)   SUMMARY OF ONCOLOGIC HISTORY:   Breast cancer of upper-outer quadrant of right female breast   02/02/2014 Mammogram Right breast: 5.8 cm irregular high-density solid mass suggestive of malignancy; left breast next millimeter internal mammary lymph node   02/04/2014 Initial Biopsy  2 biopsies were performed the right breast and axilla: Grade 2 invasive ductal carcinoma ER/PR positive HER-2 negative Ki-67 20% to 47%: Biopsy of right humerus metastatic breast cancer estrogen positive   02/17/2014 PET scan Hypermetabolic right breast cancer and right axillary lymph nodes subpectoral lymph nodes indeterminate right middle lobe pulmonary nodule and hypermetabolic proximal right humerus lesion   03/05/2014 -  Neo-Adjuvant Chemotherapy Dose dense Adriamycin and Cytoxan x4 cycles followed by Abraxane weekly x12    CHIEF COMPLIANT:  Doxorubicin and Cyclophosphamide cycle 2 day 8  INTERVAL HISTORY: Tonya Alvarez is a 68 year old Caucasian lady with above-mentioned history of right breast cancer. She is doing moderately well today.  Her blood glucose was improved with this treatment.  She did lose her hair and had her hair buzzed.  She was also  Very fatigued the Sunday following treatment.  The bottom of her feet are red and peeling.  They are slightly tender.  She is otherwise doing well and is without questions or concern.    REVIEW OF SYSTEMS:   A 10 point review of systems was conducted and is otherwise negative except for what is noted above.     I have reviewed the past medical history, past surgical history, social history and family  history with the patient and they are unchanged from previous note.  ALLERGIES:  is allergic to amoxicillin; lisinopril; and oxycodone.  MEDICATIONS:  Current Outpatient Prescriptions  Medication Sig Dispense Refill  . aspirin 81 MG tablet Take 81 mg by mouth at bedtime.     . fluticasone (FLONASE) 50 MCG/ACT nasal spray Place 1 spray into both nostrils daily as needed for allergies or rhinitis.    Marland Kitchen ibuprofen (ADVIL,MOTRIN) 600 MG tablet Take 600 mg by mouth every 12 (twelve) hours.    . insulin aspart protamine- aspart (NOVOLOG MIX 70/30) (70-30) 100 UNIT/ML injection Inject 60-68 Units into the skin 2 (two) times daily.    Marland Kitchen LORazepam (ATIVAN) 0.5 MG tablet Take 1 tablet (0.5 mg total) by mouth every 6 (six) hours as needed (Nausea or vomiting). 30 tablet 0  . losartan (COZAAR) 50 MG tablet Take 50 mg by mouth every morning.    . metFORMIN (GLUCOPHAGE) 500 MG tablet Take 1,000 mg by mouth 2 (two) times daily with a meal.     . Multiple Vitamin (MULTIVITAMIN WITH MINERALS) TABS tablet Take 1 tablet by mouth at bedtime.    . simvastatin (ZOCOR) 10 MG tablet Take 10 mg by mouth every evening.     Marland Kitchen tiZANidine (ZANAFLEX) 2 MG tablet Take 2 mg by mouth at bedtime. Pt takes this medication as needed    . acetaminophen (TYLENOL) 500 MG tablet Take 500 mg by mouth every 6 (six) hours as needed.    Marland Kitchen dexamethasone (DECADRON) 4 MG tablet Take 2 tablets by mouth  once a day on the day after chemotherapy and then take 2 tablets two times a day for 2 days. Take with food. 30 tablet 1  . lidocaine-prilocaine (EMLA) cream Apply 1 application topically as needed. 30 g 6  . ondansetron (ZOFRAN) 8 MG tablet Take 1 tablet (8 mg total) by mouth 2 (two) times daily as needed. Start on the third day after chemotherapy. 30 tablet 1  . prochlorperazine (COMPAZINE) 10 MG tablet Take 1 tablet (10 mg total) by mouth every 6 (six) hours as needed (Nausea or vomiting). 30 tablet 1   No current facility-administered  medications for this visit.    PHYSICAL EXAMINATION: ECOG PERFORMANCE STATUS: 1 - Symptomatic but completely ambulatory  Filed Vitals:   03/26/14 1505  BP: 130/78  Pulse: 83  Temp: 98.1 F (36.7 C)  Resp: 20   Filed Weights   03/26/14 1505  Weight: 307 lb 14.4 oz (139.663 kg)   GENERAL: Patient is a well appearing morbidly obese female in no acute distress HEENT:  Sclerae anicteric.  Oropharynx clear and moist. No ulcerations or evidence of oropharyngeal candidiasis. Neck is supple.  NODES:  No cervical, supraclavicular, or axillary lymphadenopathy palpated.  BREAST EXAM:  Deferred. LUNGS:  Clear to auscultation bilaterally.  No wheezes or rhonchi. HEART:  Regular rate and rhythm. No murmur appreciated. ABDOMEN:  Soft, nontender.  Positive, normoactive bowel sounds. No organomegaly palpated. MSK:  No focal spinal tenderness to palpation. Full range of motion bilaterally in the upper extremities. EXTREMITIES:  No peripheral edema.   SKIN:  Bright red erythema and peeling throughout soles of feet bilaterally.  No swelling or drainage noted.   NEURO:  Nonfocal. Well oriented.  Appropriate affect.   LABORATORY DATA:  I have reviewed the data as listed   Chemistry      Component Value Date/Time   NA 141 03/19/2014 0851   NA 141 02/23/2014 1105   K 4.6 03/19/2014 0851   K 4.6 02/23/2014 1105   CL 99 02/23/2014 1105   CO2 27 03/19/2014 0851   CO2 26 02/23/2014 1105   BUN 12.2 03/19/2014 0851   BUN 14 02/23/2014 1105   CREATININE 0.8 03/19/2014 0851   CREATININE 0.62 02/23/2014 1105      Component Value Date/Time   CALCIUM 9.5 03/19/2014 0851   CALCIUM 10.1 02/23/2014 1105   ALKPHOS 83 03/19/2014 0851   ALKPHOS 74 08/01/2010 1746   AST 14 03/19/2014 0851   AST 26 08/01/2010 1746   ALT 20 03/19/2014 0851   ALT 20 08/01/2010 1746   BILITOT 0.23 03/19/2014 0851   BILITOT 0.4 08/01/2010 1746       Lab Results  Component Value Date   WBC 3.2* 03/26/2014   HGB 12.0  03/26/2014   HCT 37.0 03/26/2014   MCV 84.3 03/26/2014   PLT 203 03/26/2014   NEUTROABS 1.5 03/26/2014     RADIOGRAPHIC STUDIES: PET/CT scan showed right humerus solitary bone metastases. Small lung nodule that is indeterminate  ASSESSMENT: 68 year old woman with T3, N1, M1, stage IV invasive ductal carcinoma, ER 90%, PR 80%, Ki-67 (unable to locate), HER-2/neu pending.    1. Patient was started on neoadjuvant Doxorubicin and Cyclophosphamide on 03/05/14.  A total of 4 cycles are planned.  This is to be followed by 12 cycles of weekly Abraxane.     PLAN:   Samyukta is doing moderately well.  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  I recommended flexitol  heel balm for her feet.  She will use this.  We may have to delay her third cycle by one week and I explained this to her and she is in agreement.  Her lab work is stable and I reviewed this with her in detail.    Cielo will return in one week for labs evaluation and hopefully cycle 3 of chemotherapy.  The patient has a good understanding of the overall plan. she agrees with it. She will call with any problems that may develop before her next visit here.  I spent 25 minutes counseling the patient face to face. The total time spent in the appointment was 30 minutes and more than 50% was on counseling and review of test results.   Minette Headland, [place] [phone] 03/26/2014 3:16 PM

## 2014-04-02 ENCOUNTER — Encounter: Payer: Self-pay | Admitting: Nurse Practitioner

## 2014-04-02 ENCOUNTER — Other Ambulatory Visit: Payer: Self-pay | Admitting: *Deleted

## 2014-04-02 ENCOUNTER — Telehealth: Payer: Self-pay | Admitting: *Deleted

## 2014-04-02 ENCOUNTER — Ambulatory Visit (HOSPITAL_BASED_OUTPATIENT_CLINIC_OR_DEPARTMENT_OTHER): Payer: Medicare Other

## 2014-04-02 ENCOUNTER — Telehealth: Payer: Self-pay | Admitting: Hematology and Oncology

## 2014-04-02 ENCOUNTER — Ambulatory Visit (HOSPITAL_BASED_OUTPATIENT_CLINIC_OR_DEPARTMENT_OTHER): Payer: Medicare Other | Admitting: Nurse Practitioner

## 2014-04-02 ENCOUNTER — Other Ambulatory Visit (HOSPITAL_BASED_OUTPATIENT_CLINIC_OR_DEPARTMENT_OTHER): Payer: Medicare Other

## 2014-04-02 ENCOUNTER — Other Ambulatory Visit: Payer: Self-pay | Admitting: Hematology

## 2014-04-02 VITALS — BP 136/76 | HR 78 | Temp 97.5°F | Resp 18 | Ht 69.5 in | Wt 313.3 lb

## 2014-04-02 DIAGNOSIS — C7951 Secondary malignant neoplasm of bone: Secondary | ICD-10-CM

## 2014-04-02 DIAGNOSIS — C50111 Malignant neoplasm of central portion of right female breast: Secondary | ICD-10-CM

## 2014-04-02 DIAGNOSIS — Z5111 Encounter for antineoplastic chemotherapy: Secondary | ICD-10-CM

## 2014-04-02 DIAGNOSIS — I10 Essential (primary) hypertension: Secondary | ICD-10-CM | POA: Insufficient documentation

## 2014-04-02 DIAGNOSIS — C50411 Malignant neoplasm of upper-outer quadrant of right female breast: Secondary | ICD-10-CM

## 2014-04-02 DIAGNOSIS — R911 Solitary pulmonary nodule: Secondary | ICD-10-CM

## 2014-04-02 DIAGNOSIS — E876 Hypokalemia: Secondary | ICD-10-CM

## 2014-04-02 DIAGNOSIS — Z452 Encounter for adjustment and management of vascular access device: Secondary | ICD-10-CM

## 2014-04-02 DIAGNOSIS — E875 Hyperkalemia: Secondary | ICD-10-CM

## 2014-04-02 DIAGNOSIS — C50919 Malignant neoplasm of unspecified site of unspecified female breast: Secondary | ICD-10-CM

## 2014-04-02 LAB — COMPREHENSIVE METABOLIC PANEL (CC13)
ALBUMIN: 3.6 g/dL (ref 3.5–5.0)
ALT: 18 U/L (ref 0–55)
AST: 13 U/L (ref 5–34)
Alkaline Phosphatase: 95 U/L (ref 40–150)
Anion Gap: 12 mEq/L — ABNORMAL HIGH (ref 3–11)
BUN: 13.8 mg/dL (ref 7.0–26.0)
CO2: 25 meq/L (ref 22–29)
Calcium: 9.6 mg/dL (ref 8.4–10.4)
Chloride: 105 mEq/L (ref 98–109)
Creatinine: 0.8 mg/dL (ref 0.6–1.1)
GLUCOSE: 211 mg/dL — AB (ref 70–140)
Potassium: 5.2 mEq/L — ABNORMAL HIGH (ref 3.5–5.1)
SODIUM: 142 meq/L (ref 136–145)
TOTAL PROTEIN: 6.4 g/dL (ref 6.4–8.3)
Total Bilirubin: 0.35 mg/dL (ref 0.20–1.20)

## 2014-04-02 LAB — CBC WITH DIFFERENTIAL/PLATELET
BASO%: 1.2 % (ref 0.0–2.0)
BASOS ABS: 0.1 10*3/uL (ref 0.0–0.1)
EOS ABS: 0 10*3/uL (ref 0.0–0.5)
EOS%: 0.5 % (ref 0.0–7.0)
HEMATOCRIT: 37.2 % (ref 34.8–46.6)
HGB: 11.8 g/dL (ref 11.6–15.9)
LYMPH%: 19.2 % (ref 14.0–49.7)
MCH: 26.6 pg (ref 25.1–34.0)
MCHC: 31.6 g/dL (ref 31.5–36.0)
MCV: 84.1 fL (ref 79.5–101.0)
MONO#: 0.7 10*3/uL (ref 0.1–0.9)
MONO%: 8.8 % (ref 0.0–14.0)
NEUT%: 70.3 % (ref 38.4–76.8)
NEUTROS ABS: 5.7 10*3/uL (ref 1.5–6.5)
PLATELETS: 155 10*3/uL (ref 145–400)
RBC: 4.43 10*6/uL (ref 3.70–5.45)
RDW: 16.9 % — ABNORMAL HIGH (ref 11.2–14.5)
WBC: 8.1 10*3/uL (ref 3.9–10.3)
lymph#: 1.6 10*3/uL (ref 0.9–3.3)

## 2014-04-02 MED ORDER — ALTEPLASE 2 MG IJ SOLR
2.0000 mg | Freq: Once | INTRAMUSCULAR | Status: AC | PRN
  Administered 2014-04-02: 2 mg
  Filled 2014-04-02: qty 2

## 2014-04-02 MED ORDER — SODIUM CHLORIDE 0.9 % IJ SOLN
10.0000 mL | INTRAMUSCULAR | Status: DC | PRN
  Administered 2014-04-02: 10 mL
  Filled 2014-04-02: qty 10

## 2014-04-02 MED ORDER — DEXAMETHASONE SODIUM PHOSPHATE 10 MG/ML IJ SOLN
INTRAMUSCULAR | Status: AC
  Filled 2014-04-02: qty 1

## 2014-04-02 MED ORDER — DEXAMETHASONE SODIUM PHOSPHATE 10 MG/ML IJ SOLN
6.0000 mg | Freq: Once | INTRAMUSCULAR | Status: AC
  Administered 2014-04-02: 6 mg via INTRAVENOUS

## 2014-04-02 MED ORDER — PALONOSETRON HCL INJECTION 0.25 MG/5ML
0.2500 mg | Freq: Once | INTRAVENOUS | Status: AC
  Administered 2014-04-02: 0.25 mg via INTRAVENOUS

## 2014-04-02 MED ORDER — HEPARIN SOD (PORK) LOCK FLUSH 100 UNIT/ML IV SOLN
500.0000 [IU] | Freq: Once | INTRAVENOUS | Status: AC | PRN
  Administered 2014-04-02: 500 [IU]
  Filled 2014-04-02: qty 5

## 2014-04-02 MED ORDER — SODIUM CHLORIDE 0.9 % IV SOLN
Freq: Once | INTRAVENOUS | Status: AC
  Administered 2014-04-02: 13:00:00 via INTRAVENOUS

## 2014-04-02 MED ORDER — PALONOSETRON HCL INJECTION 0.25 MG/5ML
INTRAVENOUS | Status: AC
Start: 2014-04-02 — End: 2014-04-02
  Filled 2014-04-02: qty 5

## 2014-04-02 MED ORDER — SODIUM CHLORIDE 0.9 % IV SOLN
150.0000 mg | Freq: Once | INTRAVENOUS | Status: AC
  Administered 2014-04-02: 150 mg via INTRAVENOUS
  Filled 2014-04-02: qty 5

## 2014-04-02 MED ORDER — FLUTICASONE PROPIONATE 50 MCG/ACT NA SUSP: 1.0000 | Freq: Every day | NASAL | Status: DC | PRN

## 2014-04-02 MED ORDER — DOXORUBICIN HCL CHEMO IV INJECTION 2 MG/ML
60.0000 mg/m2 | Freq: Once | INTRAVENOUS | Status: AC
  Administered 2014-04-02: 158 mg via INTRAVENOUS
  Filled 2014-04-02: qty 79

## 2014-04-02 MED ORDER — CYCLOPHOSPHAMIDE CHEMO INJECTION 1 GM
600.0000 mg/m2 | Freq: Once | INTRAMUSCULAR | Status: AC
  Administered 2014-04-02: 1580 mg via INTRAVENOUS
  Filled 2014-04-02: qty 79

## 2014-04-02 NOTE — Progress Notes (Signed)
Patient Care Team: Jacelyn Pi, MD as PCP - General (Endocrinology)  DIAGNOSIS: Breast cancer of upper-outer quadrant of right female breast   Primary site: Breast (Right)   Staging method: AJCC 7th Edition   Clinical: Stage IV (T3, N1, M1) signed by Rulon Eisenmenger, MD on 03/05/2014 11:50 AM   Pathologic: (T3, N1a, M1)   Summary: Stage IV (T3, N1a, M1)   SUMMARY OF ONCOLOGIC HISTORY:   Breast cancer of upper-outer quadrant of right female breast   02/02/2014 Mammogram Right breast: 5.8 cm irregular high-density solid mass suggestive of malignancy; left breast next millimeter internal mammary lymph node   02/04/2014 Initial Biopsy  2 biopsies were performed the right breast and axilla: Grade 2 invasive ductal carcinoma ER/PR positive HER-2 negative Ki-67 20% to 47%: Biopsy of right humerus metastatic breast cancer estrogen positive   02/17/2014 PET scan Hypermetabolic right breast cancer and right axillary lymph nodes subpectoral lymph nodes indeterminate right middle lobe pulmonary nodule and hypermetabolic proximal right humerus lesion   03/05/2014 -  Neo-Adjuvant Chemotherapy Dose dense Adriamycin and Cytoxan x4 cycles followed by Abraxane weekly x12    CHIEF COMPLIANT:  Doxorubicin and Cyclophosphamide cycle 3 day 1  INTERVAL HISTORY: Tonya Alvarez is a 68 year old Caucasian lady with above-mentioned history of right breast cancer.  Tonya Alvarez is doing well today. The redness and peeling to the bottom of her feet are improving with the use of the flexitol heel balm. Her palms are unaffected. She has had minimal nausea, even with the decreased dose of dexamethasone. She is moving her bowels with the help of stool softeners and miralax. Her appetite waxes and wanes depending how how acutely her taste changes after chemo. Her energy level low, but she denies shortness of breath, chest pain, cough, or palpitations. She sleeps well with ativan QHS PRN.   REVIEW OF SYSTEMS:   A detailed review  of systems is otherwise noncontributory, except where noted above.   I have reviewed the past medical history, past surgical history, social history and family history with the patient and they are unchanged from previous note.  ALLERGIES:  is allergic to amoxicillin; lisinopril; and oxycodone.  MEDICATIONS:  Current Outpatient Prescriptions  Medication Sig Dispense Refill  . acetaminophen (TYLENOL) 500 MG tablet Take 500 mg by mouth every 6 (six) hours as needed.    Marland Kitchen aspirin 81 MG tablet Take 81 mg by mouth at bedtime.     Marland Kitchen dexamethasone (DECADRON) 4 MG tablet Take 2 tablets by mouth once a day on the day after chemotherapy and then take 2 tablets two times a day for 2 days. Take with food. (Patient taking differently: Take 1 tablet by mouth once a day on the day after chemotherapy and then take 1 tablet two times a day for 2 days. Take with food.) 30 tablet 1  . ibuprofen (ADVIL,MOTRIN) 600 MG tablet Take 600 mg by mouth every 12 (twelve) hours.    . insulin aspart protamine- aspart (NOVOLOG MIX 70/30) (70-30) 100 UNIT/ML injection Inject 60-68 Units into the skin 2 (two) times daily.    Marland Kitchen lidocaine-prilocaine (EMLA) cream Apply 1 application topically as needed. 30 g 6  . LORazepam (ATIVAN) 0.5 MG tablet Take 1 tablet (0.5 mg total) by mouth every 6 (six) hours as needed (Nausea or vomiting). 30 tablet 0  . losartan (COZAAR) 50 MG tablet Take 50 mg by mouth every morning.    . metFORMIN (GLUCOPHAGE) 500 MG tablet Take 1,000 mg by mouth 2 (  two) times daily with a meal.     . Multiple Vitamin (MULTIVITAMIN WITH MINERALS) TABS tablet Take 1 tablet by mouth at bedtime.    . prochlorperazine (COMPAZINE) 10 MG tablet Take 1 tablet (10 mg total) by mouth every 6 (six) hours as needed (Nausea or vomiting). 30 tablet 1  . simvastatin (ZOCOR) 10 MG tablet Take 10 mg by mouth every evening.     Marland Kitchen tiZANidine (ZANAFLEX) 2 MG tablet Take 2 mg by mouth at bedtime. Pt takes this medication as needed    .  fluticasone (FLONASE) 50 MCG/ACT nasal spray Place 1 spray into both nostrils daily as needed for allergies or rhinitis.    Marland Kitchen ondansetron (ZOFRAN) 8 MG tablet Take 1 tablet (8 mg total) by mouth 2 (two) times daily as needed. Start on the third day after chemotherapy. (Patient not taking: Reported on 04/02/2014) 30 tablet 1   No current facility-administered medications for this visit.    PHYSICAL EXAMINATION: ECOG PERFORMANCE STATUS: 1 - Symptomatic but completely ambulatory  Filed Vitals:   04/02/14 1030  BP: 136/76  Pulse: 78  Temp: 97.5 F (36.4 C)  Resp: 18   Filed Weights   04/02/14 1030  Weight: 313 lb 4.8 oz (142.112 kg)   Skin: warm, dry, bottom of feet pink, peeling resolving  HEENT: sclerae anicteric, conjunctivae pink, oropharynx clear. No thrush or mucositis.  Lymph Nodes: No cervical or supraclavicular lymphadenopathy  Lungs: clear to auscultation bilaterally, no rales, wheezes, or rhonci  Heart: regular rate and rhythm  Abdomen: round, soft, non tender, positive bowel sounds  Musculoskeletal: No focal spinal tenderness, no peripheral edema  Neuro: non focal, well oriented, positive affect  Breasts: deferred   LABORATORY DATA:  I have reviewed the data as listed   Chemistry      Component Value Date/Time   NA 142 04/02/2014 1011   NA 141 02/23/2014 1105   K 5.2* 04/02/2014 1011   K 4.6 02/23/2014 1105   CL 99 02/23/2014 1105   CO2 25 04/02/2014 1011   CO2 26 02/23/2014 1105   BUN 13.8 04/02/2014 1011   BUN 14 02/23/2014 1105   CREATININE 0.8 04/02/2014 1011   CREATININE 0.62 02/23/2014 1105      Component Value Date/Time   CALCIUM 9.6 04/02/2014 1011   CALCIUM 10.1 02/23/2014 1105   ALKPHOS 95 04/02/2014 1011   ALKPHOS 74 08/01/2010 1746   AST 13 04/02/2014 1011   AST 26 08/01/2010 1746   ALT 18 04/02/2014 1011   ALT 20 08/01/2010 1746   BILITOT 0.35 04/02/2014 1011   BILITOT 0.4 08/01/2010 1746       Lab Results  Component Value Date    WBC 8.1 04/02/2014   HGB 11.8 04/02/2014   HCT 37.2 04/02/2014   MCV 84.1 04/02/2014   PLT 155 04/02/2014   NEUTROABS 5.7 04/02/2014     RADIOGRAPHIC STUDIES: PET/CT scan showed right humerus solitary bone metastases. Small lung nodule that is indeterminate  ASSESSMENT: 68 year old woman with T3, N1, M1, stage IV invasive ductal carcinoma, ER 90%, PR 80%, Ki-67 (unable to locate), HER-2/neu pending.    1. Patient was started on neoadjuvant Doxorubicin and Cyclophosphamide on 03/05/14.  A total of 4 cycles are planned.  This is to be followed by 12 cycles of weekly Abraxane.     PLAN:  Samyuktha is doing well today. The labs were reviewed in detail and her potassium is mildly elevated to 5.1 today. She is on  losartan 70m daily which is controlling her blood pressure. We will continue to monitor this value. For now she will avoid foods high in potassium. If the elevation continues, we will have to switch out this med for something else. We will proceed with day 1, cycle 3 of doxorubicin and cyclophosphamide today.   KJazlynwill continue to use the flexitol cream on her feet. We will continue to monitor this skin change.   KKamdenwill return next week for labs and a nadir visit. She understands and agrees with this plan. She knows the goal of treatment in her case is cure. She has been encouraged to call with any issues that might arise before her next visit here.   FMarcelino Duster [phone]/27/2015 10:58 AM

## 2014-04-02 NOTE — Telephone Encounter (Signed)
, °

## 2014-04-02 NOTE — Progress Notes (Signed)
Checked PAC, Blood return noted at 1250pm  from port, chemotherapy ordered.

## 2014-04-02 NOTE — Patient Instructions (Signed)
[facility] Discharge Instructions for Patients Receiving Chemotherapy  Today you received the following chemotherapy agents Adriamycin and Cytoxan.  To help prevent nausea and vomiting after your treatment, we encourage you to take your nausea medication as prescribed.   If you develop nausea and vomiting that is not controlled by your nausea medication, call the clinic.   BELOW ARE SYMPTOMS THAT SHOULD BE REPORTED IMMEDIATELY:  *FEVER GREATER THAN 100.5 F  *CHILLS WITH OR WITHOUT FEVER  NAUSEA AND VOMITING THAT IS NOT CONTROLLED WITH YOUR NAUSEA MEDICATION  *UNUSUAL SHORTNESS OF BREATH  *UNUSUAL BRUISING OR BLEEDING  TENDERNESS IN MOUTH AND THROAT WITH OR WITHOUT PRESENCE OF ULCERS  *URINARY PROBLEMS  *BOWEL PROBLEMS  UNUSUAL RASH Items with * indicate a potential emergency and should be followed up as soon as possible.  Feel free to call the clinic you have any questions or concerns. The clinic phone number is [phone].    

## 2014-04-02 NOTE — Telephone Encounter (Signed)
Per staff message and POF I have scheduled appts. Advised scheduler of appts. JMW  

## 2014-04-03 ENCOUNTER — Ambulatory Visit (HOSPITAL_BASED_OUTPATIENT_CLINIC_OR_DEPARTMENT_OTHER): Payer: Medicare Other

## 2014-04-03 DIAGNOSIS — Z5189 Encounter for other specified aftercare: Secondary | ICD-10-CM

## 2014-04-03 DIAGNOSIS — C50411 Malignant neoplasm of upper-outer quadrant of right female breast: Secondary | ICD-10-CM

## 2014-04-03 MED ORDER — PEGFILGRASTIM INJECTION 6 MG/0.6ML ~~LOC~~
6.0000 mg | PREFILLED_SYRINGE | Freq: Once | SUBCUTANEOUS | Status: AC
  Administered 2014-04-03: 6 mg via SUBCUTANEOUS

## 2014-04-09 ENCOUNTER — Ambulatory Visit (HOSPITAL_BASED_OUTPATIENT_CLINIC_OR_DEPARTMENT_OTHER): Payer: Medicare Other | Admitting: Nurse Practitioner

## 2014-04-09 ENCOUNTER — Telehealth: Payer: Self-pay | Admitting: Nurse Practitioner

## 2014-04-09 ENCOUNTER — Encounter: Payer: Self-pay | Admitting: Nurse Practitioner

## 2014-04-09 ENCOUNTER — Other Ambulatory Visit (HOSPITAL_BASED_OUTPATIENT_CLINIC_OR_DEPARTMENT_OTHER): Payer: Medicare Other

## 2014-04-09 ENCOUNTER — Other Ambulatory Visit: Payer: Self-pay | Admitting: *Deleted

## 2014-04-09 ENCOUNTER — Telehealth: Payer: Self-pay | Admitting: *Deleted

## 2014-04-09 VITALS — BP 133/51 | HR 78 | Temp 98.1°F | Resp 19 | Ht 69.0 in | Wt 310.8 lb

## 2014-04-09 DIAGNOSIS — C50919 Malignant neoplasm of unspecified site of unspecified female breast: Secondary | ICD-10-CM

## 2014-04-09 DIAGNOSIS — C50411 Malignant neoplasm of upper-outer quadrant of right female breast: Secondary | ICD-10-CM

## 2014-04-09 DIAGNOSIS — C7951 Secondary malignant neoplasm of bone: Secondary | ICD-10-CM

## 2014-04-09 LAB — COMPREHENSIVE METABOLIC PANEL (CC13)
ALBUMIN: 3.6 g/dL (ref 3.5–5.0)
ALK PHOS: 119 U/L (ref 40–150)
ALT: 15 U/L (ref 0–55)
AST: 11 U/L (ref 5–34)
Anion Gap: 11 mEq/L (ref 3–11)
BUN: 17.1 mg/dL (ref 7.0–26.0)
CHLORIDE: 105 meq/L (ref 98–109)
CO2: 25 mEq/L (ref 22–29)
Calcium: 9.4 mg/dL (ref 8.4–10.4)
Creatinine: 0.7 mg/dL (ref 0.6–1.1)
EGFR: 86 mL/min/{1.73_m2} — ABNORMAL LOW (ref >90)
Glucose: 150 mg/dl — ABNORMAL HIGH (ref 70–140)
POTASSIUM: 4.3 meq/L (ref 3.5–5.1)
Sodium: 140 mEq/L (ref 136–145)
Total Bilirubin: 0.29 mg/dL (ref 0.20–1.20)
Total Protein: 6.2 g/dL — ABNORMAL LOW (ref 6.4–8.3)

## 2014-04-09 LAB — CBC WITH DIFFERENTIAL/PLATELET
BASO%: 2 % (ref 0.0–2.0)
Basophils Absolute: 0.1 10*3/uL (ref 0.0–0.1)
EOS%: 1.7 % (ref 0.0–7.0)
Eosinophils Absolute: 0.1 10*3/uL (ref 0.0–0.5)
HEMATOCRIT: 33.2 % — AB (ref 34.8–46.6)
HGB: 10.7 g/dL — ABNORMAL LOW (ref 11.6–15.9)
LYMPH#: 0.8 10*3/uL — AB (ref 0.9–3.3)
LYMPH%: 27.2 % (ref 14.0–49.7)
MCH: 27.2 pg (ref 25.1–34.0)
MCHC: 32.2 g/dL (ref 31.5–36.0)
MCV: 84.3 fL (ref 79.5–101.0)
MONO#: 0.4 10*3/uL (ref 0.1–0.9)
MONO%: 11.9 % (ref 0.0–14.0)
NEUT#: 1.7 10*3/uL (ref 1.5–6.5)
NEUT%: 57.2 % (ref 38.4–76.8)
NRBC: 0 % (ref 0–0)
Platelets: 200 10*3/uL (ref 145–400)
RBC: 3.94 10*6/uL (ref 3.70–5.45)
RDW: 17.3 % — ABNORMAL HIGH (ref 11.2–14.5)
WBC: 3 10*3/uL — AB (ref 3.9–10.3)

## 2014-04-09 MED ORDER — LIDOCAINE-PRILOCAINE 2.5-2.5 % EX CREA: 1.0000 "application " | TOPICAL_CREAM | CUTANEOUS | Status: DC | PRN

## 2014-04-09 MED ORDER — LORAZEPAM 0.5 MG PO TABS: 0.5000 mg | ORAL_TABLET | Freq: Four times a day (QID) | ORAL | Status: DC | PRN

## 2014-04-09 NOTE — Telephone Encounter (Signed)
Per staff message and POF I have scheduled appts. Advised scheduler of appts and first availbale . JMW

## 2014-04-09 NOTE — Telephone Encounter (Signed)
, °

## 2014-04-09 NOTE — Progress Notes (Signed)
Patient Care Team: Jacelyn Pi, MD as PCP - General (Endocrinology)  DIAGNOSIS: Breast cancer of upper-outer quadrant of right female breast   Primary site: Breast (Right)   Staging method: AJCC 7th Edition   Clinical: Stage IV (T3, N1, M1) signed by Rulon Eisenmenger, MD on 03/05/2014 11:50 AM   Pathologic: (T3, N1a, M1)   Summary: Stage IV (T3, N1a, M1)   SUMMARY OF ONCOLOGIC HISTORY:   Breast cancer of upper-outer quadrant of right female breast   02/02/2014 Mammogram Right breast: 5.8 cm irregular high-density solid mass suggestive of malignancy; left breast next millimeter internal mammary lymph node   02/04/2014 Initial Biopsy  2 biopsies were performed the right breast and axilla: Grade 2 invasive ductal carcinoma ER/PR positive HER-2 negative Ki-67 20% to 47%: Biopsy of right humerus metastatic breast cancer estrogen positive   02/17/2014 PET scan Hypermetabolic right breast cancer and right axillary lymph nodes subpectoral lymph nodes indeterminate right middle lobe pulmonary nodule and hypermetabolic proximal right humerus lesion   03/05/2014 -  Neo-Adjuvant Chemotherapy Dose dense Adriamycin and Cytoxan x4 cycles followed by Abraxane weekly x12    CHIEF COMPLIANT:  Doxorubicin and Cyclophosphamide cycle 3 day 8  INTERVAL HISTORY: Tonya Alvarez is a 68 year old Caucasian lady with above-mentioned history of right breast cancer. She is currently being treated with neoadjuvant chemotherapy, doxorubicin and cyclophosphamide.  Tonya Alvarez's main complaint this week is fatigue. She says it gets worse and worse with every treatment. She denies fevers or chills. She uses compazine and ativan PRN for her nausea and this is effective. She is having regular bowel movements with the help of stool softeners. Her appetite is unchanged, though nothing tastes normal to her. She hands and nails are dry but no longer peeling. She denies mouth sores, rashes, or peripheral neuropathy symptoms. She has no  shortness of breath, chest pain, cough, or palpitations.   REVIEW OF SYSTEMS:   A detailed review of systems is otherwise noncontributory, except where noted above.   I have reviewed the past medical history, past surgical history, social history and family history with the patient and they are unchanged from previous note.  ALLERGIES:  is allergic to amoxicillin; lisinopril; and oxycodone.  MEDICATIONS:  Current Outpatient Prescriptions  Medication Sig Dispense Refill  . acetaminophen (TYLENOL) 500 MG tablet Take 500 mg by mouth every 6 (six) hours as needed.    Marland Kitchen aspirin 81 MG tablet Take 81 mg by mouth at bedtime.     Marland Kitchen dexamethasone (DECADRON) 4 MG tablet Take 2 tablets by mouth once a day on the day after chemotherapy and then take 2 tablets two times a day for 2 days. Take with food. (Patient taking differently: Take 1 tablet by mouth once a day on the day after chemotherapy and then take 1 tablet two times a day for 2 days. Take with food.) 30 tablet 1  . fluticasone (FLONASE) 50 MCG/ACT nasal spray Place 1 spray into both nostrils daily as needed for allergies or rhinitis. 16 g 0  . ibuprofen (ADVIL,MOTRIN) 600 MG tablet Take 600 mg by mouth every 12 (twelve) hours.    . insulin aspart protamine- aspart (NOVOLOG MIX 70/30) (70-30) 100 UNIT/ML injection Inject 60-68 Units into the skin 2 (two) times daily.    Marland Kitchen lidocaine-prilocaine (EMLA) cream Apply 1 application topically as needed. 30 g 6  . LORazepam (ATIVAN) 0.5 MG tablet Take 1 tablet (0.5 mg total) by mouth every 6 (six) hours as needed (Nausea or vomiting).  30 tablet 0  . losartan (COZAAR) 50 MG tablet Take 50 mg by mouth every morning.    . metFORMIN (GLUCOPHAGE) 500 MG tablet Take 1,000 mg by mouth 2 (two) times daily with a meal.     . Multiple Vitamin (MULTIVITAMIN WITH MINERALS) TABS tablet Take 1 tablet by mouth at bedtime.    . ondansetron (ZOFRAN) 8 MG tablet Take 1 tablet (8 mg total) by mouth 2 (two) times daily as  needed. Start on the third day after chemotherapy. (Patient not taking: Reported on 04/02/2014) 30 tablet 1  . prochlorperazine (COMPAZINE) 10 MG tablet Take 1 tablet (10 mg total) by mouth every 6 (six) hours as needed (Nausea or vomiting). 30 tablet 1  . simvastatin (ZOCOR) 10 MG tablet Take 10 mg by mouth every evening.     Marland Kitchen tiZANidine (ZANAFLEX) 2 MG tablet Take 2 mg by mouth at bedtime. Pt takes this medication as needed     No current facility-administered medications for this visit.    PHYSICAL EXAMINATION: ECOG PERFORMANCE STATUS: 1 - Symptomatic but completely ambulatory  Filed Vitals:   04/09/14 1504  BP: 133/51  Pulse: 78  Temp: 98.1 F (36.7 C)  Resp: 19   Filed Weights   04/09/14 1504  Weight: 310 lb 12.8 oz (140.978 kg)   Skin: warm, dry, bottom of feet pink, peeling resolving  HEENT: sclerae anicteric, conjunctivae pink, oropharynx clear. No thrush or mucositis.  Lymph Nodes: No cervical or supraclavicular lymphadenopathy  Lungs: clear to auscultation bilaterally, no rales, wheezes, or rhonci  Heart: regular rate and rhythm  Abdomen: round, soft, non tender, positive bowel sounds  Musculoskeletal: No focal spinal tenderness, no peripheral edema  Neuro: non focal, well oriented, positive affect  Breasts: deferred   LABORATORY DATA:  I have reviewed the data as listed   Chemistry      Component Value Date/Time   NA 142 04/02/2014 1011   NA 141 02/23/2014 1105   K 5.2* 04/02/2014 1011   K 4.6 02/23/2014 1105   CL 99 02/23/2014 1105   CO2 25 04/02/2014 1011   CO2 26 02/23/2014 1105   BUN 13.8 04/02/2014 1011   BUN 14 02/23/2014 1105   CREATININE 0.8 04/02/2014 1011   CREATININE 0.62 02/23/2014 1105      Component Value Date/Time   CALCIUM 9.6 04/02/2014 1011   CALCIUM 10.1 02/23/2014 1105   ALKPHOS 95 04/02/2014 1011   ALKPHOS 74 08/01/2010 1746   AST 13 04/02/2014 1011   AST 26 08/01/2010 1746   ALT 18 04/02/2014 1011   ALT 20 08/01/2010 1746    BILITOT 0.35 04/02/2014 1011   BILITOT 0.4 08/01/2010 1746       Lab Results  Component Value Date   WBC 3.0* 04/09/2014   HGB 10.7* 04/09/2014   HCT 33.2* 04/09/2014   MCV 84.3 04/09/2014   PLT 200 Platelet count confirmed by slide estimate 04/09/2014   NEUTROABS 1.7 04/09/2014     RADIOGRAPHIC STUDIES: PET/CT scan showed right humerus solitary bone metastases. Small lung nodule that is indeterminate  ASSESSMENT: 69 year old woman with T3, N1, M1, stage IV invasive ductal carcinoma, ER 90%, PR 80%, Ki-67 (unable to locate), HER-2/neu pending.    1. Patient was started on neoadjuvant Doxorubicin and Cyclophosphamide on 03/05/14.  A total of 4 cycles are planned.  This is to be followed by 12 cycles of weekly Abraxane.     PLAN:  Tonya Alvarez continues to do well. The CBC was  reviewed in detail and was entirely stable. The CMET is not yet available to review. She has a mildly elevated potassium count last week likely secondary to losartan use. She has tried to avoid potassium foods this past week.   I encouraged her to continue using the moisturizing cream for her hands and nails. She will continue use her PRN antiemetics and stool softeners as needed.   Tonya Alvarez will return on 12/14 for her 4th and final dose of doxorubicin and cyclophosphamide. She understands and agrees with this plan. She knows the goal of treatment in her case is cure. She has been encouraged to call with any issues that might arise before her next visit here.   Marcelino Duster, [place] [phone] 04/09/2014 3:28 PM

## 2014-04-16 ENCOUNTER — Other Ambulatory Visit: Payer: Self-pay

## 2014-04-16 ENCOUNTER — Ambulatory Visit: Payer: Medicare Other | Admitting: Hematology and Oncology

## 2014-04-16 ENCOUNTER — Ambulatory Visit: Payer: Medicare Other

## 2014-04-16 ENCOUNTER — Other Ambulatory Visit: Payer: Medicare Other

## 2014-04-16 DIAGNOSIS — C50411 Malignant neoplasm of upper-outer quadrant of right female breast: Secondary | ICD-10-CM

## 2014-04-17 ENCOUNTER — Ambulatory Visit: Payer: Medicare Other

## 2014-04-19 ENCOUNTER — Other Ambulatory Visit (HOSPITAL_BASED_OUTPATIENT_CLINIC_OR_DEPARTMENT_OTHER): Payer: Medicare Other

## 2014-04-19 ENCOUNTER — Ambulatory Visit (HOSPITAL_BASED_OUTPATIENT_CLINIC_OR_DEPARTMENT_OTHER): Payer: Medicare Other | Admitting: Hematology and Oncology

## 2014-04-19 ENCOUNTER — Telehealth: Payer: Self-pay | Admitting: Hematology and Oncology

## 2014-04-19 ENCOUNTER — Ambulatory Visit (HOSPITAL_BASED_OUTPATIENT_CLINIC_OR_DEPARTMENT_OTHER): Payer: Medicare Other

## 2014-04-19 VITALS — BP 133/82 | HR 92 | Temp 98.6°F | Resp 20 | Ht 69.0 in | Wt 314.8 lb

## 2014-04-19 DIAGNOSIS — C50411 Malignant neoplasm of upper-outer quadrant of right female breast: Secondary | ICD-10-CM

## 2014-04-19 DIAGNOSIS — C773 Secondary and unspecified malignant neoplasm of axilla and upper limb lymph nodes: Secondary | ICD-10-CM

## 2014-04-19 DIAGNOSIS — Z170 Estrogen receptor positive status [ER+]: Secondary | ICD-10-CM

## 2014-04-19 DIAGNOSIS — C7951 Secondary malignant neoplasm of bone: Secondary | ICD-10-CM

## 2014-04-19 DIAGNOSIS — Z5111 Encounter for antineoplastic chemotherapy: Secondary | ICD-10-CM

## 2014-04-19 DIAGNOSIS — D6481 Anemia due to antineoplastic chemotherapy: Secondary | ICD-10-CM

## 2014-04-19 DIAGNOSIS — E875 Hyperkalemia: Secondary | ICD-10-CM

## 2014-04-19 DIAGNOSIS — D649 Anemia, unspecified: Secondary | ICD-10-CM

## 2014-04-19 LAB — CBC WITH DIFFERENTIAL/PLATELET
BASO%: 1.3 % (ref 0.0–2.0)
Basophils Absolute: 0.1 10*3/uL (ref 0.0–0.1)
EOS ABS: 0 10*3/uL (ref 0.0–0.5)
EOS%: 0.4 % (ref 0.0–7.0)
HEMATOCRIT: 33.6 % — AB (ref 34.8–46.6)
HGB: 10.9 g/dL — ABNORMAL LOW (ref 11.6–15.9)
LYMPH%: 19.3 % (ref 14.0–49.7)
MCH: 27.5 pg (ref 25.1–34.0)
MCHC: 32.3 g/dL (ref 31.5–36.0)
MCV: 85.1 fL (ref 79.5–101.0)
MONO#: 0.7 10*3/uL (ref 0.1–0.9)
MONO%: 10.4 % (ref 0.0–14.0)
NEUT#: 4.4 10*3/uL (ref 1.5–6.5)
NEUT%: 68.6 % (ref 38.4–76.8)
PLATELETS: 178 10*3/uL (ref 145–400)
RBC: 3.95 10*6/uL (ref 3.70–5.45)
RDW: 19.2 % — ABNORMAL HIGH (ref 11.2–14.5)
WBC: 6.4 10*3/uL (ref 3.9–10.3)
lymph#: 1.2 10*3/uL (ref 0.9–3.3)

## 2014-04-19 LAB — COMPREHENSIVE METABOLIC PANEL (CC13)
ALK PHOS: 82 U/L (ref 40–150)
ALT: 22 U/L (ref 0–55)
ANION GAP: 11 meq/L (ref 3–11)
AST: 16 U/L (ref 5–34)
Albumin: 3.6 g/dL (ref 3.5–5.0)
BILIRUBIN TOTAL: 0.27 mg/dL (ref 0.20–1.20)
BUN: 14.6 mg/dL (ref 7.0–26.0)
CO2: 24 mEq/L (ref 22–29)
Calcium: 9.3 mg/dL (ref 8.4–10.4)
Chloride: 105 mEq/L (ref 98–109)
Creatinine: 0.7 mg/dL (ref 0.6–1.1)
EGFR: 84 mL/min/{1.73_m2} — ABNORMAL LOW (ref >90)
Glucose: 127 mg/dl (ref 70–140)
Potassium: 4.5 mEq/L (ref 3.5–5.1)
SODIUM: 141 meq/L (ref 136–145)
TOTAL PROTEIN: 6.2 g/dL — AB (ref 6.4–8.3)

## 2014-04-19 MED ORDER — DEXAMETHASONE SODIUM PHOSPHATE 10 MG/ML IJ SOLN
INTRAMUSCULAR | Status: AC
  Filled 2014-04-19: qty 1

## 2014-04-19 MED ORDER — DOXORUBICIN HCL CHEMO IV INJECTION 2 MG/ML
60.0000 mg/m2 | Freq: Once | INTRAVENOUS | Status: AC
  Administered 2014-04-19: 158 mg via INTRAVENOUS
  Filled 2014-04-19: qty 79

## 2014-04-19 MED ORDER — PALONOSETRON HCL INJECTION 0.25 MG/5ML
INTRAVENOUS | Status: AC
  Filled 2014-04-19: qty 5

## 2014-04-19 MED ORDER — PALONOSETRON HCL INJECTION 0.25 MG/5ML
0.2500 mg | Freq: Once | INTRAVENOUS | Status: AC
  Administered 2014-04-19: 0.25 mg via INTRAVENOUS

## 2014-04-19 MED ORDER — HEPARIN SOD (PORK) LOCK FLUSH 100 UNIT/ML IV SOLN
500.0000 [IU] | Freq: Once | INTRAVENOUS | Status: AC | PRN
  Administered 2014-04-19: 500 [IU]
  Filled 2014-04-19: qty 5

## 2014-04-19 MED ORDER — DEXAMETHASONE SODIUM PHOSPHATE 10 MG/ML IJ SOLN
6.0000 mg | Freq: Once | INTRAMUSCULAR | Status: AC
  Administered 2014-04-19: 6 mg via INTRAVENOUS

## 2014-04-19 MED ORDER — SODIUM CHLORIDE 0.9 % IJ SOLN
10.0000 mL | INTRAMUSCULAR | Status: DC | PRN
  Administered 2014-04-19: 10 mL
  Filled 2014-04-19: qty 10

## 2014-04-19 MED ORDER — SODIUM CHLORIDE 0.9 % IV SOLN
150.0000 mg | Freq: Once | INTRAVENOUS | Status: AC
  Administered 2014-04-19: 150 mg via INTRAVENOUS
  Filled 2014-04-19: qty 5

## 2014-04-19 MED ORDER — SODIUM CHLORIDE 0.9 % IV SOLN
Freq: Once | INTRAVENOUS | Status: AC
  Administered 2014-04-19: 14:00:00 via INTRAVENOUS

## 2014-04-19 MED ORDER — SODIUM CHLORIDE 0.9 % IV SOLN
600.0000 mg/m2 | Freq: Once | INTRAVENOUS | Status: AC
  Administered 2014-04-19: 1580 mg via INTRAVENOUS
  Filled 2014-04-19: qty 79

## 2014-04-19 NOTE — Patient Instructions (Signed)
[facility] Discharge Instructions for Patients Receiving Chemotherapy  Today you received the following chemotherapy agents Adriamycin/Cytoxan.   To help prevent nausea and vomiting after your treatment, we encourage you to take your nausea medication as directed.    If you develop nausea and vomiting that is not controlled by your nausea medication, call the clinic.   BELOW ARE SYMPTOMS THAT SHOULD BE REPORTED IMMEDIATELY:  *FEVER GREATER THAN 100.5 F  *CHILLS WITH OR WITHOUT FEVER  NAUSEA AND VOMITING THAT IS NOT CONTROLLED WITH YOUR NAUSEA MEDICATION  *UNUSUAL SHORTNESS OF BREATH  *UNUSUAL BRUISING OR BLEEDING  TENDERNESS IN MOUTH AND THROAT WITH OR WITHOUT PRESENCE OF ULCERS  *URINARY PROBLEMS  *BOWEL PROBLEMS  UNUSUAL RASH Items with * indicate a potential emergency and should be followed up as soon as possible.  Feel free to call the clinic you have any questions or concerns. The clinic phone number is [phone].    

## 2014-04-19 NOTE — Telephone Encounter (Signed)
, °

## 2014-04-19 NOTE — Progress Notes (Signed)
Patient Care Team: Jacelyn Pi, MD as PCP - General (Endocrinology)  DIAGNOSIS: Breast cancer of upper-outer quadrant of right female breast   Staging form: Breast, AJCC 7th Edition     Clinical: Stage IIIA (T3, N1, M1) - Signed by Rulon Eisenmenger, MD on 03/05/2014     Pathologic: T3, N1a, M1 - Unsigned   SUMMARY OF ONCOLOGIC HISTORY:   Breast cancer of upper-outer quadrant of right female breast   02/02/2014 Mammogram Right breast: 5.8 cm irregular high-density solid mass suggestive of malignancy; left breast next millimeter internal mammary lymph node   02/04/2014 Initial Biopsy  2 biopsies were performed the right breast and axilla: Grade 2 invasive ductal carcinoma ER/PR positive HER-2 negative Ki-67 20% to 47%: Biopsy of right humerus metastatic breast cancer estrogen positive   02/17/2014 PET scan Hypermetabolic right breast cancer and right axillary lymph nodes subpectoral lymph nodes indeterminate right middle lobe pulmonary nodule and hypermetabolic proximal right humerus lesion   03/05/2014 -  Neo-Adjuvant Chemotherapy Dose dense Adriamycin and Cytoxan x4 cycles followed by Abraxane weekly x12    CHIEF COMPLIANT: Fatigue related to chemotherapy or today is cycle 4 of dose dense AC  INTERVAL HISTORY: Tonya Alvarez is a 68 year old Caucasian with above-mentioned history of right-sided breast cancer currently being treated with neoadjuvant chemotherapy with dose dense Adriamycin and Cytoxan. Today is cycle 4 of Adriamycin and Cytoxan. Apart from severe fatigue she is tolerated chemotherapy fairly well. She gets mild nausea for which she takes Compazine and Ativan. This seemed to be helping her. She gets headaches with Zofran. Denies any major nausea or vomiting. Denies any fevers or chills.  REVIEW OF SYSTEMS:   Constitutional: Denies fevers, chills or abnormal weight loss Eyes: Denies blurriness of vision Ears, nose, mouth, throat, and face: Denies mucositis or sore  throat Respiratory: Denies cough, dyspnea or wheezes Cardiovascular: Denies palpitation, chest discomfort or lower extremity swelling Gastrointestinal:  Denies nausea, heartburn or change in bowel habits Skin: Denies abnormal skin rashes Lymphatics: Denies new lymphadenopathy or easy bruising Neurological:Denies numbness, tingling or new weaknesses Behavioral/Psych: Mood is stable, no new changes  Breast: The right breast lump appears to have gotten softer. All other systems were reviewed with the patient and are negative.  I have reviewed the past medical history, past surgical history, social history and family history with the patient and they are unchanged from previous note.  ALLERGIES:  is allergic to amoxicillin; lisinopril; and oxycodone.  MEDICATIONS:  Current Outpatient Prescriptions  Medication Sig Dispense Refill  . acetaminophen (TYLENOL) 500 MG tablet Take 500 mg by mouth every 6 (six) hours as needed.    Marland Kitchen aspirin 81 MG tablet Take 81 mg by mouth at bedtime.     Marland Kitchen dexamethasone (DECADRON) 4 MG tablet Take 2 tablets by mouth once a day on the day after chemotherapy and then take 2 tablets two times a day for 2 days. Take with food. (Patient taking differently: Take 1 tablet by mouth once a day on the day after chemotherapy and then take 1 tablet two times a day for 2 days. Take with food.) 30 tablet 1  . fluticasone (FLONASE) 50 MCG/ACT nasal spray Place 1 spray into both nostrils daily as needed for allergies or rhinitis. 16 g 0  . ibuprofen (ADVIL,MOTRIN) 600 MG tablet Take 600 mg by mouth every 12 (twelve) hours.    . insulin aspart protamine- aspart (NOVOLOG MIX 70/30) (70-30) 100 UNIT/ML injection Inject 60-68 Units into the skin 2 (two)  times daily.    Marland Kitchen lidocaine-prilocaine (EMLA) cream Apply 1 application topically as needed. 30 g 6  . LORazepam (ATIVAN) 0.5 MG tablet Take 1 tablet (0.5 mg total) by mouth every 6 (six) hours as needed (Nausea or vomiting). 30 tablet 0   . losartan (COZAAR) 50 MG tablet Take 50 mg by mouth every morning.    . metFORMIN (GLUCOPHAGE) 500 MG tablet Take 1,000 mg by mouth 2 (two) times daily with a meal.     . Multiple Vitamin (MULTIVITAMIN WITH MINERALS) TABS tablet Take 1 tablet by mouth at bedtime.    . ondansetron (ZOFRAN) 8 MG tablet Take 1 tablet (8 mg total) by mouth 2 (two) times daily as needed. Start on the third day after chemotherapy. 30 tablet 1  . prochlorperazine (COMPAZINE) 10 MG tablet Take 1 tablet (10 mg total) by mouth every 6 (six) hours as needed (Nausea or vomiting). 30 tablet 1  . simvastatin (ZOCOR) 10 MG tablet Take 10 mg by mouth every evening.     Marland Kitchen tiZANidine (ZANAFLEX) 2 MG tablet Take 2 mg by mouth at bedtime. Pt takes this medication as needed     No current facility-administered medications for this visit.    PHYSICAL EXAMINATION: ECOG PERFORMANCE STATUS: 1 - Symptomatic but completely ambulatory  Filed Vitals:   04/19/14 1030  BP: 133/82  Pulse: 92  Temp: 98.6 F (37 C)  Resp: 20   Filed Weights   04/19/14 1030  Weight: 314 lb 12.8 oz (142.792 kg)    GENERAL:alert, no distress and comfortable SKIN: skin color, texture, turgor are normal, no rashes or significant lesions EYES: normal, Conjunctiva are pink and non-injected, sclera clear OROPHARYNX:no exudate, no erythema and lips, buccal mucosa, and tongue normal  NECK: supple, thyroid normal size, non-tender, without nodularity LYMPH:  no palpable lymphadenopathy in the cervical, axillary or inguinal LUNGS: clear to auscultation and percussion with normal breathing effort HEART: regular rate & rhythm and no murmurs and no lower extremity edema ABDOMEN:abdomen soft, non-tender and normal bowel sounds Musculoskeletal:no cyanosis of digits and no clubbing  NEURO: alert & oriented x 3 with fluent speech, no focal motor/sensory deficits BREAST: Right breast was palpated and the lump appears to be softer and less palpable suggesting a  response to chemotherapy  LABORATORY DATA:  I have reviewed the data as listed   Chemistry      Component Value Date/Time   NA 141 04/19/2014 1013   NA 141 02/23/2014 1105   K 4.5 04/19/2014 1013   K 4.6 02/23/2014 1105   CL 99 02/23/2014 1105   CO2 24 04/19/2014 1013   CO2 26 02/23/2014 1105   BUN 14.6 04/19/2014 1013   BUN 14 02/23/2014 1105   CREATININE 0.7 04/19/2014 1013   CREATININE 0.62 02/23/2014 1105      Component Value Date/Time   CALCIUM 9.3 04/19/2014 1013   CALCIUM 10.1 02/23/2014 1105   ALKPHOS 82 04/19/2014 1013   ALKPHOS 74 08/01/2010 1746   AST 16 04/19/2014 1013   AST 26 08/01/2010 1746   ALT 22 04/19/2014 1013   ALT 20 08/01/2010 1746   BILITOT 0.27 04/19/2014 1013   BILITOT 0.4 08/01/2010 1746       Lab Results  Component Value Date   WBC 6.4 04/19/2014   HGB 10.9* 04/19/2014   HCT 33.6* 04/19/2014   MCV 85.1 04/19/2014   PLT 178 04/19/2014   NEUTROABS 4.4 04/19/2014    ASSESSMENT & PLAN:  Breast cancer of  upper-outer quadrant of right female breast 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 This will be followed by 12 cycles of weekly Abraxane.  Toxicities to chemotherapy: 1. Chemotherapy-induced fatigue 2. Chemotherapy-induced anemia 3. Nausea related to chemotherapy 4. Hyperkalemia resolved by taking low potassium diet  Response to chemotherapy: Today's breast exam did not reveal any solid mass in the breast. Clinically I believe she is responding to chemotherapy and hence I do not plan on getting an interim MRI. We'll plan on obtaining MRI after the completion of chemotherapy.  Monitoring closely for toxicities. Return to clinic in 2 weeks for weekly Abraxane treatments.   No orders of the defined types were placed in this encounter.   The patient has a good  understanding of the overall plan. she agrees with it. She will call with any problems that may develop before her next visit here.   Rulon Eisenmenger, MD 04/19/2014 1:04 PM

## 2014-04-19 NOTE — Assessment & Plan Note (Signed)
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 This will be followed by 12 cycles of weekly Abraxane.  Toxicities to chemotherapy: 1. Chemotherapy-induced fatigue 2. Chemotherapy-induced anemia 3. Nausea related to chemotherapy 4. Hyperkalemia resolved by taking low potassium diet  Response to chemotherapy: Today's breast exam did not reveal any solid mass in the breast. Clinically I believe she is responding to chemotherapy and said not plan on getting an interim MRI. We'll plan on obtaining MRI after the completion of chemotherapy.  Monitoring closely for toxicities. Return to clinic in 2 weeks for weekly Abraxane treatments.

## 2014-04-20 ENCOUNTER — Ambulatory Visit (HOSPITAL_BASED_OUTPATIENT_CLINIC_OR_DEPARTMENT_OTHER): Payer: Medicare Other

## 2014-04-20 DIAGNOSIS — C50411 Malignant neoplasm of upper-outer quadrant of right female breast: Secondary | ICD-10-CM

## 2014-04-20 DIAGNOSIS — Z5189 Encounter for other specified aftercare: Secondary | ICD-10-CM

## 2014-04-20 MED ORDER — PEGFILGRASTIM INJECTION 6 MG/0.6ML ~~LOC~~
6.0000 mg | PREFILLED_SYRINGE | Freq: Once | SUBCUTANEOUS | Status: AC
  Administered 2014-04-20: 6 mg via SUBCUTANEOUS
  Filled 2014-04-20: qty 0.6

## 2014-04-20 NOTE — Patient Instructions (Signed)
Pegfilgrastim injection What is this medicine? PEGFILGRASTIM (peg fil GRA stim) is a long-acting granulocyte colony-stimulating factor that stimulates the growth of neutrophils, a type of white blood cell important in the body's fight against infection. It is used to reduce the incidence of fever and infection in patients with certain types of cancer who are receiving chemotherapy that affects the bone marrow. This medicine may be used for other purposes; ask your health care provider or pharmacist if you have questions. COMMON BRAND NAME(S): Neulasta What should I tell my health care provider before I take this medicine? They need to know if you have any of these conditions: -latex allergy -ongoing radiation therapy -sickle cell disease -skin reactions to acrylic adhesives (On-Body Injector only) -an unusual or allergic reaction to pegfilgrastim, filgrastim, other medicines, foods, dyes, or preservatives -pregnant or trying to get pregnant -breast-feeding How should I use this medicine? This medicine is for injection under the skin. If you get this medicine at home, you will be taught how to prepare and give the pre-filled syringe or how to use the On-body Injector. Refer to the patient Instructions for Use for detailed instructions. Use exactly as directed. Take your medicine at regular intervals. Do not take your medicine more often than directed. It is important that you put your used needles and syringes in a special sharps container. Do not put them in a trash can. If you do not have a sharps container, call your pharmacist or healthcare provider to get one. Talk to your pediatrician regarding the use of this medicine in children. Special care may be needed. Overdosage: If you think you have taken too much of this medicine contact a poison control center or emergency room at once. NOTE: This medicine is only for you. Do not share this medicine with others. What if I miss a dose? It is  important not to miss your dose. Call your doctor or health care professional if you miss your dose. If you miss a dose due to an On-body Injector failure or leakage, a new dose should be administered as soon as possible using a single prefilled syringe for manual use. What may interact with this medicine? Interactions have not been studied. Give your health care provider a list of all the medicines, herbs, non-prescription drugs, or dietary supplements you use. Also tell them if you smoke, drink alcohol, or use illegal drugs. Some items may interact with your medicine. This list may not describe all possible interactions. Give your health care provider a list of all the medicines, herbs, non-prescription drugs, or dietary supplements you use. Also tell them if you smoke, drink alcohol, or use illegal drugs. Some items may interact with your medicine. What should I watch for while using this medicine? You may need blood work done while you are taking this medicine. If you are going to need a MRI, CT scan, or other procedure, tell your doctor that you are using this medicine (On-Body Injector only). What side effects may I notice from receiving this medicine? Side effects that you should report to your doctor or health care professional as soon as possible: -allergic reactions like skin rash, itching or hives, swelling of the face, lips, or tongue -dizziness -fever -pain, redness, or irritation at site where injected -pinpoint red spots on the skin -shortness of breath or breathing problems -stomach or side pain, or pain at the shoulder -swelling -tiredness -trouble passing urine Side effects that usually do not require medical attention (report to your doctor   or health care professional if they continue or are bothersome): -bone pain -muscle pain This list may not describe all possible side effects. Call your doctor for medical advice about side effects. You may report side effects to FDA at  1-800-FDA-1088. Where should I keep my medicine? Keep out of the reach of children. Store pre-filled syringes in a refrigerator between 2 and 8 degrees C (36 and 46 degrees F). Do not freeze. Keep in carton to protect from light. Throw away this medicine if it is left out of the refrigerator for more than 48 hours. Throw away any unused medicine after the expiration date. NOTE: This sheet is a summary. It may not cover all possible information. If you have questions about this medicine, talk to your doctor, pharmacist, or health care provider.  2015, [employer]. (2013-07-23 16:14:05)  

## 2014-04-23 ENCOUNTER — Ambulatory Visit: Payer: Medicare Other | Admitting: Hematology and Oncology

## 2014-04-23 ENCOUNTER — Other Ambulatory Visit: Payer: Medicare Other

## 2014-04-27 ENCOUNTER — Other Ambulatory Visit: Payer: Self-pay | Admitting: Hematology and Oncology

## 2014-04-29 ENCOUNTER — Other Ambulatory Visit: Payer: Self-pay | Admitting: *Deleted

## 2014-04-29 DIAGNOSIS — C50411 Malignant neoplasm of upper-outer quadrant of right female breast: Secondary | ICD-10-CM

## 2014-04-29 MED ORDER — LORAZEPAM 0.5 MG PO TABS: 0.5000 mg | ORAL_TABLET | Freq: Four times a day (QID) | ORAL | Status: DC | PRN

## 2014-04-29 MED ORDER — PROCHLORPERAZINE MALEATE 10 MG PO TABS
10.0000 mg | ORAL_TABLET | Freq: Four times a day (QID) | ORAL | Status: DC | PRN
Start: 2014-04-29 — End: 2014-05-10

## 2014-05-03 ENCOUNTER — Other Ambulatory Visit: Payer: Self-pay

## 2014-05-03 ENCOUNTER — Ambulatory Visit (HOSPITAL_BASED_OUTPATIENT_CLINIC_OR_DEPARTMENT_OTHER): Payer: Medicare Other

## 2014-05-03 ENCOUNTER — Other Ambulatory Visit (HOSPITAL_BASED_OUTPATIENT_CLINIC_OR_DEPARTMENT_OTHER): Payer: Medicare Other

## 2014-05-03 ENCOUNTER — Ambulatory Visit (HOSPITAL_BASED_OUTPATIENT_CLINIC_OR_DEPARTMENT_OTHER): Payer: Medicare Other | Admitting: Hematology and Oncology

## 2014-05-03 ENCOUNTER — Telehealth: Payer: Self-pay | Admitting: Hematology and Oncology

## 2014-05-03 VITALS — BP 157/82 | HR 68 | Temp 98.6°F | Resp 20 | Ht 69.0 in | Wt 314.9 lb

## 2014-05-03 DIAGNOSIS — Z5111 Encounter for antineoplastic chemotherapy: Secondary | ICD-10-CM

## 2014-05-03 DIAGNOSIS — C773 Secondary and unspecified malignant neoplasm of axilla and upper limb lymph nodes: Secondary | ICD-10-CM

## 2014-05-03 DIAGNOSIS — C7951 Secondary malignant neoplasm of bone: Secondary | ICD-10-CM

## 2014-05-03 DIAGNOSIS — C50411 Malignant neoplasm of upper-outer quadrant of right female breast: Secondary | ICD-10-CM

## 2014-05-03 DIAGNOSIS — D6481 Anemia due to antineoplastic chemotherapy: Secondary | ICD-10-CM

## 2014-05-03 DIAGNOSIS — C50919 Malignant neoplasm of unspecified site of unspecified female breast: Secondary | ICD-10-CM

## 2014-05-03 DIAGNOSIS — R110 Nausea: Secondary | ICD-10-CM

## 2014-05-03 DIAGNOSIS — E1140 Type 2 diabetes mellitus with diabetic neuropathy, unspecified: Secondary | ICD-10-CM

## 2014-05-03 LAB — COMPREHENSIVE METABOLIC PANEL (CC13)
ALT: 21 U/L (ref 0–55)
AST: 15 U/L (ref 5–34)
Albumin: 3.7 g/dL (ref 3.5–5.0)
Alkaline Phosphatase: 88 U/L (ref 40–150)
Anion Gap: 10 mEq/L (ref 3–11)
BUN: 9.8 mg/dL (ref 7.0–26.0)
CO2: 27 mEq/L (ref 22–29)
Calcium: 9.3 mg/dL (ref 8.4–10.4)
Chloride: 102 mEq/L (ref 98–109)
Creatinine: 0.8 mg/dL (ref 0.6–1.1)
EGFR: 82 mL/min/{1.73_m2} — ABNORMAL LOW (ref >90)
Glucose: 187 mg/dl — ABNORMAL HIGH (ref 70–140)
Potassium: 4.5 mEq/L (ref 3.5–5.1)
Sodium: 139 mEq/L (ref 136–145)
Total Bilirubin: 0.32 mg/dL (ref 0.20–1.20)
Total Protein: 6.4 g/dL (ref 6.4–8.3)

## 2014-05-03 LAB — CBC WITH DIFFERENTIAL/PLATELET
BASO%: 0.8 % (ref 0.0–2.0)
Basophils Absolute: 0.1 10*3/uL (ref 0.0–0.1)
EOS ABS: 0.1 10*3/uL (ref 0.0–0.5)
EOS%: 0.9 % (ref 0.0–7.0)
HCT: 34.3 % — ABNORMAL LOW (ref 34.8–46.6)
HEMOGLOBIN: 11.2 g/dL — AB (ref 11.6–15.9)
LYMPH%: 17.9 % (ref 14.0–49.7)
MCH: 28.4 pg (ref 25.1–34.0)
MCHC: 32.6 g/dL (ref 31.5–36.0)
MCV: 87.1 fL (ref 79.5–101.0)
MONO#: 0.9 10*3/uL (ref 0.1–0.9)
MONO%: 9.6 % (ref 0.0–14.0)
NEUT#: 6.5 10*3/uL (ref 1.5–6.5)
NEUT%: 70.8 % (ref 38.4–76.8)
PLATELETS: 173 10*3/uL (ref 145–400)
RBC: 3.94 10*6/uL (ref 3.70–5.45)
RDW: 21.6 % — AB (ref 11.2–14.5)
WBC: 9.1 10*3/uL (ref 3.9–10.3)
lymph#: 1.6 10*3/uL (ref 0.9–3.3)

## 2014-05-03 MED ORDER — SODIUM CHLORIDE 0.9 % IJ SOLN
10.0000 mL | INTRAMUSCULAR | Status: DC | PRN
  Administered 2014-05-03: 10 mL
  Filled 2014-05-03: qty 10

## 2014-05-03 MED ORDER — HEPARIN SOD (PORK) LOCK FLUSH 100 UNIT/ML IV SOLN
500.0000 [IU] | Freq: Once | INTRAVENOUS | Status: AC | PRN
  Administered 2014-05-03: 500 [IU]
  Filled 2014-05-03: qty 5

## 2014-05-03 MED ORDER — SODIUM CHLORIDE 0.9 % IV SOLN
Freq: Once | INTRAVENOUS | Status: AC
  Administered 2014-05-03: 12:00:00 via INTRAVENOUS

## 2014-05-03 MED ORDER — ONDANSETRON 8 MG/50ML IVPB (CHCC)
8.0000 mg | Freq: Once | INTRAVENOUS | Status: AC
  Administered 2014-05-03: 8 mg via INTRAVENOUS

## 2014-05-03 MED ORDER — ONDANSETRON 8 MG/NS 50 ML IVPB
INTRAVENOUS | Status: AC
  Filled 2014-05-03: qty 8

## 2014-05-03 MED ORDER — PACLITAXEL PROTEIN-BOUND CHEMO INJECTION 100 MG
85.0000 mg/m2 | Freq: Once | INTRAVENOUS | Status: AC
  Administered 2014-05-03: 225 mg via INTRAVENOUS
  Filled 2014-05-03: qty 45

## 2014-05-03 NOTE — Telephone Encounter (Signed)
perpof to sch pt appt-CX inj per LL-sent emailt oMW to sch IV 1/5

## 2014-05-03 NOTE — Progress Notes (Signed)
Patient Care Team: Jacelyn Pi, MD as PCP - General (Endocrinology)  DIAGNOSIS: Breast cancer of upper-outer quadrant of right female breast   Staging form: Breast, AJCC 7th Edition     Clinical: Stage IIIA (T3, N1, M1) - Signed by Rulon Eisenmenger, MD on 03/05/2014     Pathologic: T3, N1a, M1 - Unsigned   SUMMARY OF ONCOLOGIC HISTORY:   Breast cancer of upper-outer quadrant of right female breast   02/02/2014 Mammogram Right breast: 5.8 cm irregular high-density solid mass suggestive of malignancy; left breast next millimeter internal mammary lymph node   02/04/2014 Initial Biopsy  2 biopsies were performed the right breast and axilla: Grade 2 invasive ductal carcinoma ER/PR positive HER-2 negative Ki-67 20% to 47%: Biopsy of right humerus metastatic breast cancer estrogen positive   02/17/2014 PET scan Hypermetabolic right breast cancer and right axillary lymph nodes subpectoral lymph nodes indeterminate right middle lobe pulmonary nodule and hypermetabolic proximal right humerus lesion   03/05/2014 -  Neo-Adjuvant Chemotherapy Dose dense Adriamycin and Cytoxan x4 cycles followed by Abraxane weekly x12    CHIEF COMPLIANT: Today is week 1 Abraxane  INTERVAL HISTORY: Tonya Alvarez is a 68 year old lady with above-mentioned history of right-sided breast cancer currently on neoadjuvant chemotherapy. She completed 4 cycles of dose dense Adriamycin and Cytoxan. She is currently on Abraxane that will start weekly from today. Her major complaints are her feet are very erythematous with some desquamation. 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 Her energy levels are improved. Taste is somewhat better.  REVIEW OF SYSTEMS:   Constitutional: Denies fevers, chills or abnormal weight loss Eyes: Denies blurriness of vision Ears, nose, mouth, throat, and face: Denies mucositis or sore throat Respiratory: Denies cough, dyspnea or wheezes Cardiovascular: Denies palpitation,  chest discomfort or lower extremity swelling Gastrointestinal:  Denies nausea, heartburn or change in bowel habits Skin: Denies abnormal skin rashes Lymphatics: Denies new lymphadenopathy or easy bruising Neurological:Denies numbness, tingling or new weaknesses Behavioral/Psych: Mood is stable, no new changes   All other systems were reviewed with the patient and are negative.  I have reviewed the past medical history, past surgical history, social history and family history with the patient and they are unchanged from previous note.  ALLERGIES:  is allergic to amoxicillin; lisinopril; and oxycodone.  MEDICATIONS:  Current Outpatient Prescriptions  Medication Sig Dispense Refill  . acetaminophen (TYLENOL) 500 MG tablet Take 500 mg by mouth every 6 (six) hours as needed.    Marland Kitchen aspirin 81 MG tablet Take 81 mg by mouth at bedtime.     Marland Kitchen dexamethasone (DECADRON) 4 MG tablet     . fluticasone (FLONASE) 50 MCG/ACT nasal spray Place 1 spray into both nostrils daily as needed for allergies or rhinitis. 16 g 0  . ibuprofen (ADVIL,MOTRIN) 600 MG tablet Take 600 mg by mouth every 12 (twelve) hours.    . insulin aspart protamine- aspart (NOVOLOG MIX 70/30) (70-30) 100 UNIT/ML injection Inject 60-68 Units into the skin 2 (two) times daily.    Marland Kitchen lidocaine-prilocaine (EMLA) cream Apply 1 application topically as needed. 30 g 6  . LORazepam (ATIVAN) 0.5 MG tablet Take 1 tablet (0.5 mg total) by mouth every 6 (six) hours as needed (Nausea or vomiting). 30 tablet 0  . losartan (COZAAR) 50 MG tablet Take 50 mg by mouth every morning.    . metFORMIN (GLUCOPHAGE) 500 MG tablet Take 1,000 mg by mouth 2 (two) times daily with a meal.     .  Multiple Vitamin (MULTIVITAMIN WITH MINERALS) TABS tablet Take 1 tablet by mouth at bedtime.    . ondansetron (ZOFRAN) 8 MG tablet     . prochlorperazine (COMPAZINE) 10 MG tablet Take 1 tablet (10 mg total) by mouth every 6 (six) hours as needed for nausea or vomiting. 30  tablet 0  . simvastatin (ZOCOR) 10 MG tablet Take 10 mg by mouth every evening.     Marland Kitchen tiZANidine (ZANAFLEX) 2 MG tablet Take 2 mg by mouth at bedtime. Pt takes this medication as needed     No current facility-administered medications for this visit.    PHYSICAL EXAMINATION: ECOG PERFORMANCE STATUS: 1 - Symptomatic but completely ambulatory  Filed Vitals:   05/03/14 1040  BP: 157/82  Pulse: 68  Temp: 98.6 F (37 C)  Resp: 20   Filed Weights   05/03/14 1040  Weight: 314 lb 14.4 oz (142.838 kg)    GENERAL:alert, no distress and comfortable SKIN: skin color, texture, turgor are normal, no rashes or significant lesions EYES: normal, Conjunctiva are pink and non-injected, sclera clear OROPHARYNX:no exudate, no erythema and lips, buccal mucosa, and tongue normal  NECK: supple, thyroid normal size, non-tender, without nodularity LYMPH:  no palpable lymphadenopathy in the cervical, axillary or inguinal LUNGS: clear to auscultation and percussion with normal breathing effort HEART: regular rate & rhythm and no murmurs and no lower extremity edema ABDOMEN:abdomen soft, non-tender and normal bowel sounds Musculoskeletal: Erythematous rash on bottom of her feet NEURO: alert & oriented x 3 with fluent speech, no focal motor/sensory deficits   LABORATORY DATA:  I have reviewed the data as listed   Chemistry      Component Value Date/Time   NA 139 05/03/2014 1004   NA 141 02/23/2014 1105   K 4.5 05/03/2014 1004   K 4.6 02/23/2014 1105   CL 99 02/23/2014 1105   CO2 27 05/03/2014 1004   CO2 26 02/23/2014 1105   BUN 9.8 05/03/2014 1004   BUN 14 02/23/2014 1105   CREATININE 0.8 05/03/2014 1004   CREATININE 0.62 02/23/2014 1105      Component Value Date/Time   CALCIUM 9.3 05/03/2014 1004   CALCIUM 10.1 02/23/2014 1105   ALKPHOS 88 05/03/2014 1004   ALKPHOS 74 08/01/2010 1746   AST 15 05/03/2014 1004   AST 26 08/01/2010 1746   ALT 21 05/03/2014 1004   ALT 20 08/01/2010 1746    BILITOT 0.32 05/03/2014 1004   BILITOT 0.4 08/01/2010 1746       Lab Results  Component Value Date   WBC 9.1 05/03/2014   HGB 11.2* 05/03/2014   HCT 34.3* 05/03/2014   MCV 87.1 05/03/2014   PLT 173 05/03/2014   NEUTROABS 6.5 05/03/2014    ASSESSMENT & PLAN:  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 This will be followed by 12 cycles of weekly Abraxane.  Current treatment: Week 1 Abraxane today  Toxicities to chemotherapy: 1. Chemotherapy-induced fatigue 2. Chemotherapy-induced anemia 3. Nausea related to chemotherapy 4. Hyperkalemia resolved by taking low potassium diet 5. Desquamating rash on the feet: Related to chemotherapy. 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 We are reducing the dosage of Abraxane to 85 mg/m from the start. Patient will apply OKeefes lotion to her feet  Return to clinic in 1 week for  toxicity check and follow-up  No orders of the defined types were placed in this encounter.   The patient has a good understanding of the overall plan. she agrees with it. She will call with any problems that may develop before her next visit here.   Rulon Eisenmenger, MD 05/03/2014 11:02 AM

## 2014-05-03 NOTE — Assessment & Plan Note (Addendum)
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 This will be followed by 12 cycles of weekly Abraxane.  Current treatment: Week 1 Abraxane today  Toxicities to chemotherapy: 1. Chemotherapy-induced fatigue 2. Chemotherapy-induced anemia 3. Nausea related to chemotherapy 4. Hyperkalemia resolved by taking low potassium diet 5. Desquamating rash on the feet: Related to chemotherapy. 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 We are reducing the dosage of Abraxane to 85 mg/m from the start.  Return to clinic in 1 week for toxicity check and follow-up

## 2014-05-03 NOTE — Patient Instructions (Addendum)
[facility] Discharge Instructions for Patients Receiving Chemotherapy  Today you received the following chemotherapy agents Abraxane  To help prevent nausea and vomiting after your treatment, we encourage you to take your nausea medication as directed   If you develop nausea and vomiting that is not controlled by your nausea medication, call the clinic.   BELOW ARE SYMPTOMS THAT SHOULD BE REPORTED IMMEDIATELY:  *FEVER GREATER THAN 100.5 F  *CHILLS WITH OR WITHOUT FEVER  NAUSEA AND VOMITING THAT IS NOT CONTROLLED WITH YOUR NAUSEA MEDICATION  *UNUSUAL SHORTNESS OF BREATH  *UNUSUAL BRUISING OR BLEEDING  TENDERNESS IN MOUTH AND THROAT WITH OR WITHOUT PRESENCE OF ULCERS  *URINARY PROBLEMS  *BOWEL PROBLEMS  UNUSUAL RASH Items with * indicate a potential emergency and should be followed up as soon as possible.  Feel free to call the clinic you have any questions or concerns. The clinic phone number is [phone].      Nanoparticle Albumin-Bound Paclitaxel injection What is this medicine? NANOPARTICLE ALBUMIN-BOUND PACLITAXEL (Na no PAHR ti kuhl al BYOO muhn-bound PAK li TAX el) is a chemotherapy drug. It targets fast dividing cells, like cancer cells, and causes these cells to die. This medicine is used to treat advanced breast cancer and advanced lung cancer. This medicine may be used for other purposes; ask your health care provider or pharmacist if you have questions. COMMON BRAND NAME(S): Abraxane What should I tell my health care provider before I take this medicine? They need to know if you have any of these conditions: -kidney disease -liver disease -low blood counts, like low platelets, red blood cells, or white blood cells -recent or ongoing radiation therapy -an unusual or allergic reaction to paclitaxel, albumin, other chemotherapy, other medicines, foods, dyes, or preservatives -pregnant or trying to get pregnant -breast-feeding How should I  use this medicine? This drug is given as an infusion into a vein. It is administered in a hospital or clinic by a specially trained health care professional. Talk to your pediatrician regarding the use of this medicine in children. Special care may be needed. Overdosage: If you think you have taken too much of this medicine contact a poison control center or emergency room at once. NOTE: This medicine is only for you. Do not share this medicine with others. What if I miss a dose? It is important not to miss your dose. Call your doctor or health care professional if you are unable to keep an appointment. What may interact with this medicine? -cyclosporine -diazepam -ketoconazole -medicines to increase blood counts like filgrastim, pegfilgrastim, sargramostim -other chemotherapy drugs like cisplatin, doxorubicin, epirubicin, etoposide, teniposide, vincristine -quinidine -testosterone -vaccines -verapamil Talk to your doctor or health care professional before taking any of these medicines: -acetaminophen -aspirin -ibuprofen -ketoprofen -naproxen This list may not describe all possible interactions. Give your health care provider a list of all the medicines, herbs, non-prescription drugs, or dietary supplements you use. Also tell them if you smoke, drink alcohol, or use illegal drugs. Some items may interact with your medicine. What should I watch for while using this medicine? Your condition will be monitored carefully while you are receiving this medicine. You will need important blood work done while you are taking this medicine. This drug may make you feel generally unwell. This is not uncommon, as chemotherapy can affect healthy cells as well as cancer cells. Report any side effects. Continue your course of treatment even though you feel ill unless your doctor tells you to stop. In   some cases, you may be given additional medicines to help with side effects. Follow all directions for their  use. Call your doctor or health care professional for advice if you get a fever, chills or sore throat, or other symptoms of a cold or flu. Do not treat yourself. This drug decreases your body's ability to fight infections. Try to avoid being around people who are sick. This medicine may increase your risk to bruise or bleed. Call your doctor or health care professional if you notice any unusual bleeding. Be careful brushing and flossing your teeth or using a toothpick because you may get an infection or bleed more easily. If you have any dental work done, tell your dentist you are receiving this medicine. Avoid taking products that contain aspirin, acetaminophen, ibuprofen, naproxen, or ketoprofen unless instructed by your doctor. These medicines may hide a fever. Do not become pregnant while taking this medicine. Women should inform their doctor if they wish to become pregnant or think they might be pregnant. There is a potential for serious side effects to an unborn child. Talk to your health care professional or pharmacist for more information. Do not breast-feed an infant while taking this medicine. Men are advised not to father a child while receiving this medicine. What side effects may I notice from receiving this medicine? Side effects that you should report to your doctor or health care professional as soon as possible: -allergic reactions like skin rash, itching or hives, swelling of the face, lips, or tongue -low blood counts - This drug may decrease the number of white blood cells, red blood cells and platelets. You may be at increased risk for infections and bleeding. -signs of infection - fever or chills, cough, sore throat, pain or difficulty passing urine -signs of decreased platelets or bleeding - bruising, pinpoint red spots on the skin, black, tarry stools, nosebleeds -signs of decreased red blood cells - unusually weak or tired, fainting spells, lightheadedness -breathing  problems -changes in vision -chest pain -high or low blood pressure -mouth sores -nausea and vomiting -pain, swelling, redness or irritation at the injection site -pain, tingling, numbness in the hands or feet -slow or irregular heartbeat -swelling of the ankle, feet, hands Side effects that usually do not require medical attention (report to your doctor or health care professional if they continue or are bothersome): -aches, pains -changes in the color of fingernails -diarrhea -hair loss -loss of appetite This list may not describe all possible side effects. Call your doctor for medical advice about side effects. You may report side effects to FDA at 1-800-FDA-1088. Where should I keep my medicine? This drug is given in a hospital or clinic and will not be stored at home. NOTE: This sheet is a summary. It may not cover all possible information. If you have questions about this medicine, talk to your doctor, pharmacist, or health care provider.  2015, [employer]. (2012-06-16 16:48:50)  

## 2014-05-10 ENCOUNTER — Telehealth: Payer: Self-pay | Admitting: *Deleted

## 2014-05-10 ENCOUNTER — Telehealth: Payer: Self-pay | Admitting: Hematology and Oncology

## 2014-05-10 ENCOUNTER — Other Ambulatory Visit (HOSPITAL_BASED_OUTPATIENT_CLINIC_OR_DEPARTMENT_OTHER): Payer: Medicare Other

## 2014-05-10 ENCOUNTER — Ambulatory Visit (HOSPITAL_BASED_OUTPATIENT_CLINIC_OR_DEPARTMENT_OTHER): Payer: Medicare Other | Admitting: Hematology and Oncology

## 2014-05-10 ENCOUNTER — Other Ambulatory Visit: Payer: Medicare Other

## 2014-05-10 ENCOUNTER — Ambulatory Visit (HOSPITAL_BASED_OUTPATIENT_CLINIC_OR_DEPARTMENT_OTHER): Payer: Medicare Other

## 2014-05-10 DIAGNOSIS — R439 Unspecified disturbances of smell and taste: Secondary | ICD-10-CM

## 2014-05-10 DIAGNOSIS — Z5111 Encounter for antineoplastic chemotherapy: Secondary | ICD-10-CM

## 2014-05-10 DIAGNOSIS — C773 Secondary and unspecified malignant neoplasm of axilla and upper limb lymph nodes: Secondary | ICD-10-CM

## 2014-05-10 DIAGNOSIS — C7951 Secondary malignant neoplasm of bone: Secondary | ICD-10-CM

## 2014-05-10 DIAGNOSIS — C50411 Malignant neoplasm of upper-outer quadrant of right female breast: Secondary | ICD-10-CM

## 2014-05-10 DIAGNOSIS — R234 Changes in skin texture: Secondary | ICD-10-CM

## 2014-05-10 DIAGNOSIS — D6481 Anemia due to antineoplastic chemotherapy: Secondary | ICD-10-CM

## 2014-05-10 DIAGNOSIS — R110 Nausea: Secondary | ICD-10-CM | POA: Diagnosis not present

## 2014-05-10 DIAGNOSIS — R5383 Other fatigue: Secondary | ICD-10-CM

## 2014-05-10 DIAGNOSIS — C50919 Malignant neoplasm of unspecified site of unspecified female breast: Secondary | ICD-10-CM

## 2014-05-10 LAB — COMPREHENSIVE METABOLIC PANEL (CC13)
ALK PHOS: 74 U/L (ref 40–150)
ALT: 21 U/L (ref 0–55)
AST: 17 U/L (ref 5–34)
Albumin: 3.7 g/dL (ref 3.5–5.0)
Anion Gap: 9 mEq/L (ref 3–11)
BILIRUBIN TOTAL: 0.35 mg/dL (ref 0.20–1.20)
BUN: 15.3 mg/dL (ref 7.0–26.0)
CO2: 26 mEq/L (ref 22–29)
CREATININE: 0.7 mg/dL (ref 0.6–1.1)
Calcium: 9 mg/dL (ref 8.4–10.4)
Chloride: 105 mEq/L (ref 98–109)
EGFR: 84 mL/min/{1.73_m2} — ABNORMAL LOW (ref >90)
GLUCOSE: 158 mg/dL — AB (ref 70–140)
Potassium: 4.2 mEq/L (ref 3.5–5.1)
SODIUM: 139 meq/L (ref 136–145)
Total Protein: 6.3 g/dL — ABNORMAL LOW (ref 6.4–8.3)

## 2014-05-10 LAB — CBC WITH DIFFERENTIAL/PLATELET
BASO%: 2.2 % — ABNORMAL HIGH (ref 0.0–2.0)
BASOS ABS: 0.1 10*3/uL (ref 0.0–0.1)
EOS%: 1.2 % (ref 0.0–7.0)
Eosinophils Absolute: 0.1 10*3/uL (ref 0.0–0.5)
HEMATOCRIT: 32.3 % — AB (ref 34.8–46.6)
HEMOGLOBIN: 10.4 g/dL — AB (ref 11.6–15.9)
LYMPH%: 22 % (ref 14.0–49.7)
MCH: 28.7 pg (ref 25.1–34.0)
MCHC: 32.2 g/dL (ref 31.5–36.0)
MCV: 89 fL (ref 79.5–101.0)
MONO#: 0.5 10*3/uL (ref 0.1–0.9)
MONO%: 9 % (ref 0.0–14.0)
NEUT#: 3.3 10*3/uL (ref 1.5–6.5)
NEUT%: 65.6 % (ref 38.4–76.8)
Platelets: 273 10*3/uL (ref 145–400)
RBC: 3.63 10*6/uL — ABNORMAL LOW (ref 3.70–5.45)
RDW: 20.1 % — ABNORMAL HIGH (ref 11.2–14.5)
WBC: 5.1 10*3/uL (ref 3.9–10.3)
lymph#: 1.1 10*3/uL (ref 0.9–3.3)

## 2014-05-10 MED ORDER — PROCHLORPERAZINE MALEATE 10 MG PO TABS: 10.0000 mg | ORAL_TABLET | Freq: Four times a day (QID) | ORAL | Status: DC | PRN

## 2014-05-10 MED ORDER — PACLITAXEL PROTEIN-BOUND CHEMO INJECTION 100 MG
85.0000 mg/m2 | Freq: Once | INTRAVENOUS | Status: AC
  Administered 2014-05-10: 225 mg via INTRAVENOUS
  Filled 2014-05-10: qty 45

## 2014-05-10 MED ORDER — ONDANSETRON 8 MG/NS 50 ML IVPB
INTRAVENOUS | Status: AC
  Filled 2014-05-10: qty 8

## 2014-05-10 MED ORDER — SODIUM CHLORIDE 0.9 % IJ SOLN
10.0000 mL | INTRAMUSCULAR | Status: DC | PRN
  Administered 2014-05-10: 10 mL
  Filled 2014-05-10: qty 10

## 2014-05-10 MED ORDER — ONDANSETRON 8 MG/50ML IVPB (CHCC)
8.0000 mg | Freq: Once | INTRAVENOUS | Status: AC
  Administered 2014-05-10: 8 mg via INTRAVENOUS

## 2014-05-10 MED ORDER — HEPARIN SOD (PORK) LOCK FLUSH 100 UNIT/ML IV SOLN
500.0000 [IU] | Freq: Once | INTRAVENOUS | Status: AC | PRN
  Administered 2014-05-10: 500 [IU]
  Filled 2014-05-10: qty 5

## 2014-05-10 MED ORDER — SODIUM CHLORIDE 0.9 % IV SOLN
Freq: Once | INTRAVENOUS | Status: AC
  Administered 2014-05-10: 10:00:00 via INTRAVENOUS

## 2014-05-10 NOTE — Telephone Encounter (Signed)
I have adjusted 1/18

## 2014-05-10 NOTE — Telephone Encounter (Signed)
m °

## 2014-05-10 NOTE — Assessment & Plan Note (Signed)
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 This is being followed by 12 cycles of weekly Abraxane.  Current treatment: Week 2 Abraxane today  Toxicities to chemotherapy: 1. Chemotherapy-induced fatigue 2. Chemotherapy-induced anemia: Being monitored 3. Nausea related to chemotherapy: Improved with Compazine, will renew the prescription today  4. Hyperkalemia resolved by taking low potassium diet 5. Desquamating rash on the feet: Related to chemotherapy. 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 This symptom is stable with the reduced dosage of Abraxane to 85 mg/m from the start. Patient is applying OKeefes lotion to her feet 6.chemotherapy-induced taste changes    Return to clinic in 2 weeks for follow-up.

## 2014-05-10 NOTE — Patient Instructions (Signed)
[facility] Discharge Instructions for Patients Receiving Chemotherapy  Today you received the following chemotherapy agents Abraxane  To help prevent nausea and vomiting after your treatment, we encourage you to take your nausea medication as prescribed.   If you develop nausea and vomiting that is not controlled by your nausea medication, call the clinic.   BELOW ARE SYMPTOMS THAT SHOULD BE REPORTED IMMEDIATELY:  *FEVER GREATER THAN 100.5 F  *CHILLS WITH OR WITHOUT FEVER  NAUSEA AND VOMITING THAT IS NOT CONTROLLED WITH YOUR NAUSEA MEDICATION  *UNUSUAL SHORTNESS OF BREATH  *UNUSUAL BRUISING OR BLEEDING  TENDERNESS IN MOUTH AND THROAT WITH OR WITHOUT PRESENCE OF ULCERS  *URINARY PROBLEMS  *BOWEL PROBLEMS  UNUSUAL RASH Items with * indicate a potential emergency and should be followed up as soon as possible.  Feel free to call the clinic you have any questions or concerns. The clinic phone number is [phone].    

## 2014-05-10 NOTE — Progress Notes (Signed)
Patient Care Team: Jacelyn Pi, MD as PCP - General (Endocrinology)  DIAGNOSIS: Breast cancer of upper-outer quadrant of right female breast   Staging form: Breast, AJCC 7th Edition     Clinical: Stage IIIA (T3, N1, M1) - Signed by Rulon Eisenmenger, MD on 03/05/2014     Pathologic: T3, N1a, M1 - Unsigned   SUMMARY OF ONCOLOGIC HISTORY:   Breast cancer of upper-outer quadrant of right female breast   02/02/2014 Mammogram Right breast: 5.8 cm irregular high-density solid mass suggestive of malignancy; left breast next millimeter internal mammary lymph node   02/04/2014 Initial Biopsy  2 biopsies were performed the right breast and axilla: Grade 2 invasive ductal carcinoma ER/PR positive HER-2 negative Ki-67 20% to 47%: 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 She completed dose dense in the medicine Cytoxan and is here today to receive week 2 of Abraxane. She tolerated week 1 fairly well except for mild nausea which resolved with Compazine. She is got good energy levels. Denies any worsening of shortness of breath or neuropathy. She had a desquamating rash on the feet related to prior chemotherapy which is stable.  REVIEW OF SYSTEMS:   Constitutional: Denies fevers, chills or abnormal weight loss Eyes: Denies blurriness of vision Ears, nose, mouth, throat, and face: Denies mucositis or sore throat Respiratory: Denies cough, dyspnea or  wheezes Cardiovascular: Denies palpitation, chest discomfort or lower extremity swelling Gastrointestinal:  Denies nausea, heartburn or change in bowel habits Skin: Denies abnormal skin rashes Lymphatics: Denies new lymphadenopathy or easy bruising Neurological:Denies numbness, tingling or new weaknesses Behavioral/Psych: Mood is stable, no new changes  Breast: The breast lump is markedly improved All other systems were reviewed with the patient and are negative.  I have reviewed the past medical history, past surgical history, social history and family history with the patient and they are unchanged from previous note.  ALLERGIES:  is allergic to amoxicillin; lisinopril; and oxycodone.  MEDICATIONS:  Current Outpatient Prescriptions  Medication Sig Dispense Refill  . acetaminophen (TYLENOL) 500 MG tablet Take 500 mg by mouth every 6 (six) hours as needed.    Marland Kitchen aspirin 81 MG tablet Take 81 mg by mouth at bedtime.     Marland Kitchen dexamethasone (DECADRON) 4 MG tablet     . fluticasone (FLONASE) 50 MCG/ACT nasal spray Place 1 spray into both nostrils daily as needed for allergies or rhinitis. 16 g 0  . ibuprofen (ADVIL,MOTRIN) 600 MG tablet Take 600 mg by mouth every 12 (twelve) hours.    . insulin aspart protamine- aspart (NOVOLOG MIX 70/30) (70-30) 100 UNIT/ML injection Inject 60-68 Units into the skin 2 (two) times daily.    Marland Kitchen lidocaine-prilocaine (EMLA) cream Apply 1 application topically as needed. 30 g 6  . LORazepam (ATIVAN) 0.5 MG tablet Take 1 tablet (0.5 mg total) by mouth every 6 (six) hours as needed (Nausea or vomiting). 30 tablet 0  . losartan (COZAAR) 50 MG tablet Take 50 mg by mouth every morning.    . metFORMIN (GLUCOPHAGE) 500 MG tablet  Take 1,000 mg by mouth 2 (two) times daily with a meal.     . Multiple Vitamin (MULTIVITAMIN WITH MINERALS) TABS tablet Take 1 tablet by mouth at bedtime.    . ondansetron (ZOFRAN) 8 MG tablet     . prochlorperazine (COMPAZINE) 10 MG tablet Take 1  tablet (10 mg total) by mouth every 6 (six) hours as needed for nausea or vomiting. 30 tablet 0  . simvastatin (ZOCOR) 10 MG tablet Take 10 mg by mouth every evening.     Marland Kitchen tiZANidine (ZANAFLEX) 2 MG tablet Take 2 mg by mouth at bedtime. Pt takes this medication as needed     No current facility-administered medications for this visit.    PHYSICAL EXAMINATION: ECOG PERFORMANCE STATUS: 1 - Symptomatic but completely ambulatory  Filed Vitals:   05/10/14 0937  BP: 134/66  Pulse: 71  Temp: 97.9 F (36.6 C)  Resp: 18   Filed Weights   05/10/14 0937  Weight: 314 lb 12.8 oz (142.792 kg)    GENERAL:alert, no distress and comfortable SKIN: skin color, texture, turgor are normal, no rashes or significant lesions EYES: normal, Conjunctiva are pink and non-injected, sclera clear OROPHARYNX:no exudate, no erythema and lips, buccal mucosa, and tongue normal  NECK: supple, thyroid normal size, non-tender, without nodularity LYMPH:  no palpable lymphadenopathy in the cervical, axillary or inguinal LUNGS: clear to auscultation and percussion with normal breathing effort HEART: regular rate & rhythm and no murmurs and no lower extremity edema ABDOMEN:abdomen soft, non-tender and normal bowel sounds Musculoskeletal:no cyanosis of digits and no clubbing  NEURO: Distal sensory neuropathy  LABORATORY DATA:  I have reviewed the data as listed   Chemistry      Component Value Date/Time   NA 139 05/03/2014 1004   NA 141 02/23/2014 1105   K 4.5 05/03/2014 1004   K 4.6 02/23/2014 1105   CL 99 02/23/2014 1105   CO2 27 05/03/2014 1004   CO2 26 02/23/2014 1105   BUN 9.8 05/03/2014 1004   BUN 14 02/23/2014 1105   CREATININE 0.8 05/03/2014 1004   CREATININE 0.62 02/23/2014 1105      Component Value Date/Time   CALCIUM 9.3 05/03/2014 1004   CALCIUM 10.1 02/23/2014 1105   ALKPHOS 88 05/03/2014 1004   ALKPHOS 74 08/01/2010 1746   AST 15 05/03/2014 1004   AST 26 08/01/2010 1746   ALT 21  05/03/2014 1004   ALT 20 08/01/2010 1746   BILITOT 0.32 05/03/2014 1004   BILITOT 0.4 08/01/2010 1746       Lab Results  Component Value Date   WBC 5.1 05/10/2014   HGB 10.4* 05/10/2014   HCT 32.3* 05/10/2014   MCV 89.0 05/10/2014   PLT 273 05/10/2014   NEUTROABS 3.3 05/10/2014    ASSESSMENT & PLAN:  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 This is being followed by 12 cycles of weekly Abraxane.  Current treatment: Week 2 Abraxane today  Toxicities to chemotherapy: 1. Chemotherapy-induced fatigue 2. Chemotherapy-induced anemia: Being monitored 3. Nausea related to chemotherapy: Improved with Compazine, will renew the prescription today  4. Hyperkalemia resolved by taking low potassium diet 5. Desquamating rash on the feet: Related to chemotherapy. 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 This symptom is stable with the reduced dosage of Abraxane to  85 mg/m from the start. Patient is applying OKeefes lotion to her feet 6.chemotherapy-induced taste changes    Return to clinic in 2 weeks for follow-up.    Orders Placed This Encounter  Procedures  . CBC with Differential    Standing Status: Future     Number of Occurrences:      Standing Expiration Date: 05/10/2015  . Comprehensive metabolic panel (Cmet) - [facility]    Standing Status: Future     Number of Occurrences:      Standing Expiration Date: 05/10/2015   The patient has a good understanding of the overall plan. she agrees with it. She will call with any problems that may develop before her next visit here.   Rulon Eisenmenger, MD 05/10/2014 9:53 AM

## 2014-05-14 ENCOUNTER — Other Ambulatory Visit: Payer: Self-pay | Admitting: Hematology and Oncology

## 2014-05-14 ENCOUNTER — Other Ambulatory Visit: Payer: Self-pay | Admitting: Nurse Practitioner

## 2014-05-14 DIAGNOSIS — C50411 Malignant neoplasm of upper-outer quadrant of right female breast: Secondary | ICD-10-CM

## 2014-05-14 MED ORDER — LORAZEPAM 0.5 MG PO TABS: 0.5000 mg | ORAL_TABLET | Freq: Four times a day (QID) | ORAL | Status: DC | PRN

## 2014-05-17 ENCOUNTER — Ambulatory Visit (HOSPITAL_BASED_OUTPATIENT_CLINIC_OR_DEPARTMENT_OTHER): Payer: Medicare Other

## 2014-05-17 ENCOUNTER — Other Ambulatory Visit (HOSPITAL_BASED_OUTPATIENT_CLINIC_OR_DEPARTMENT_OTHER): Payer: Medicare Other

## 2014-05-17 DIAGNOSIS — C50919 Malignant neoplasm of unspecified site of unspecified female breast: Secondary | ICD-10-CM

## 2014-05-17 DIAGNOSIS — C50411 Malignant neoplasm of upper-outer quadrant of right female breast: Secondary | ICD-10-CM

## 2014-05-17 DIAGNOSIS — Z5111 Encounter for antineoplastic chemotherapy: Secondary | ICD-10-CM

## 2014-05-17 LAB — COMPREHENSIVE METABOLIC PANEL (CC13)
ALT: 26 U/L (ref 0–55)
AST: 20 U/L (ref 5–34)
Albumin: 3.7 g/dL (ref 3.5–5.0)
Alkaline Phosphatase: 79 U/L (ref 40–150)
Anion Gap: 7 mEq/L (ref 3–11)
BUN: 15 mg/dL (ref 7.0–26.0)
CALCIUM: 9.3 mg/dL (ref 8.4–10.4)
CHLORIDE: 105 meq/L (ref 98–109)
CO2: 28 mEq/L (ref 22–29)
CREATININE: 0.8 mg/dL (ref 0.6–1.1)
EGFR: 80 mL/min/{1.73_m2} — ABNORMAL LOW (ref >90)
Glucose: 159 mg/dl — ABNORMAL HIGH (ref 70–140)
Potassium: 4.5 mEq/L (ref 3.5–5.1)
Sodium: 140 mEq/L (ref 136–145)
Total Bilirubin: 0.34 mg/dL (ref 0.20–1.20)
Total Protein: 6.5 g/dL (ref 6.4–8.3)

## 2014-05-17 LAB — CBC WITH DIFFERENTIAL/PLATELET
BASO%: 2.5 % — AB (ref 0.0–2.0)
Basophils Absolute: 0.1 10*3/uL (ref 0.0–0.1)
EOS%: 1.7 % (ref 0.0–7.0)
Eosinophils Absolute: 0.1 10*3/uL (ref 0.0–0.5)
HCT: 33.4 % — ABNORMAL LOW (ref 34.8–46.6)
HEMOGLOBIN: 10.9 g/dL — AB (ref 11.6–15.9)
LYMPH#: 1.3 10*3/uL (ref 0.9–3.3)
LYMPH%: 24.9 % (ref 14.0–49.7)
MCH: 29.2 pg (ref 25.1–34.0)
MCHC: 32.5 g/dL (ref 31.5–36.0)
MCV: 90.1 fL (ref 79.5–101.0)
MONO#: 0.6 10*3/uL (ref 0.1–0.9)
MONO%: 11.5 % (ref 0.0–14.0)
NEUT#: 3 10*3/uL (ref 1.5–6.5)
NEUT%: 59.4 % (ref 38.4–76.8)
Platelets: 291 10*3/uL (ref 145–400)
RBC: 3.71 10*6/uL (ref 3.70–5.45)
RDW: 23.1 % — ABNORMAL HIGH (ref 11.2–14.5)
WBC: 5.1 10*3/uL (ref 3.9–10.3)

## 2014-05-17 MED ORDER — ONDANSETRON 8 MG/50ML IVPB (CHCC)
8.0000 mg | Freq: Once | INTRAVENOUS | Status: AC
  Administered 2014-05-17: 8 mg via INTRAVENOUS

## 2014-05-17 MED ORDER — SODIUM CHLORIDE 0.9 % IV SOLN
Freq: Once | INTRAVENOUS | Status: AC
  Administered 2014-05-17: 10:00:00 via INTRAVENOUS

## 2014-05-17 MED ORDER — ONDANSETRON 8 MG/NS 50 ML IVPB
INTRAVENOUS | Status: AC
  Filled 2014-05-17: qty 8

## 2014-05-17 MED ORDER — HEPARIN SOD (PORK) LOCK FLUSH 100 UNIT/ML IV SOLN
500.0000 [IU] | Freq: Once | INTRAVENOUS | Status: AC | PRN
  Administered 2014-05-17: 500 [IU]
  Filled 2014-05-17: qty 5

## 2014-05-17 MED ORDER — SODIUM CHLORIDE 0.9 % IJ SOLN
10.0000 mL | INTRAMUSCULAR | Status: DC | PRN
  Administered 2014-05-17: 10 mL
  Filled 2014-05-17: qty 10

## 2014-05-17 MED ORDER — PACLITAXEL PROTEIN-BOUND CHEMO INJECTION 100 MG
85.0000 mg/m2 | Freq: Once | INTRAVENOUS | Status: AC
  Administered 2014-05-17: 225 mg via INTRAVENOUS
  Filled 2014-05-17: qty 45

## 2014-05-17 NOTE — Patient Instructions (Signed)
[facility] Discharge Instructions for Patients Receiving Chemotherapy  Today you received the following chemotherapy agent: Abraxane   To help prevent nausea and vomiting after your treatment, we encourage you to take your nausea medication as prescribed.    If you develop nausea and vomiting that is not controlled by your nausea medication, call the clinic.   BELOW ARE SYMPTOMS THAT SHOULD BE REPORTED IMMEDIATELY:  *FEVER GREATER THAN 100.5 F  *CHILLS WITH OR WITHOUT FEVER  NAUSEA AND VOMITING THAT IS NOT CONTROLLED WITH YOUR NAUSEA MEDICATION  *UNUSUAL SHORTNESS OF BREATH  *UNUSUAL BRUISING OR BLEEDING  TENDERNESS IN MOUTH AND THROAT WITH OR WITHOUT PRESENCE OF ULCERS  *URINARY PROBLEMS  *BOWEL PROBLEMS  UNUSUAL RASH Items with * indicate a potential emergency and should be followed up as soon as possible.  Feel free to call the clinic you have any questions or concerns. The clinic phone number is [phone].    

## 2014-05-18 ENCOUNTER — Ambulatory Visit: Payer: Medicare Other | Admitting: Oncology

## 2014-05-18 ENCOUNTER — Other Ambulatory Visit: Payer: Medicare Other

## 2014-05-24 ENCOUNTER — Other Ambulatory Visit (HOSPITAL_BASED_OUTPATIENT_CLINIC_OR_DEPARTMENT_OTHER): Payer: Medicare Other

## 2014-05-24 ENCOUNTER — Telehealth: Payer: Self-pay | Admitting: Hematology and Oncology

## 2014-05-24 ENCOUNTER — Ambulatory Visit (HOSPITAL_BASED_OUTPATIENT_CLINIC_OR_DEPARTMENT_OTHER): Payer: Medicare Other

## 2014-05-24 ENCOUNTER — Other Ambulatory Visit: Payer: Medicare Other

## 2014-05-24 ENCOUNTER — Ambulatory Visit (HOSPITAL_BASED_OUTPATIENT_CLINIC_OR_DEPARTMENT_OTHER): Payer: Medicare Other | Admitting: Hematology and Oncology

## 2014-05-24 DIAGNOSIS — C773 Secondary and unspecified malignant neoplasm of axilla and upper limb lymph nodes: Secondary | ICD-10-CM

## 2014-05-24 DIAGNOSIS — D6481 Anemia due to antineoplastic chemotherapy: Secondary | ICD-10-CM | POA: Diagnosis not present

## 2014-05-24 DIAGNOSIS — Z5111 Encounter for antineoplastic chemotherapy: Secondary | ICD-10-CM | POA: Diagnosis not present

## 2014-05-24 DIAGNOSIS — C50411 Malignant neoplasm of upper-outer quadrant of right female breast: Secondary | ICD-10-CM | POA: Diagnosis not present

## 2014-05-24 DIAGNOSIS — C7951 Secondary malignant neoplasm of bone: Secondary | ICD-10-CM

## 2014-05-24 LAB — COMPREHENSIVE METABOLIC PANEL (CC13)
ALBUMIN: 3.6 g/dL (ref 3.5–5.0)
ALK PHOS: 79 U/L (ref 40–150)
ALT: 22 U/L (ref 0–55)
AST: 16 U/L (ref 5–34)
Anion Gap: 11 mEq/L (ref 3–11)
BUN: 13.8 mg/dL (ref 7.0–26.0)
CALCIUM: 9 mg/dL (ref 8.4–10.4)
CHLORIDE: 104 meq/L (ref 98–109)
CO2: 26 mEq/L (ref 22–29)
Creatinine: 0.8 mg/dL (ref 0.6–1.1)
EGFR: 79 mL/min/{1.73_m2} — AB (ref >90)
Glucose: 189 mg/dl — ABNORMAL HIGH (ref 70–140)
POTASSIUM: 4.2 meq/L (ref 3.5–5.1)
Sodium: 141 mEq/L (ref 136–145)
Total Bilirubin: 0.3 mg/dL (ref 0.20–1.20)
Total Protein: 6.4 g/dL (ref 6.4–8.3)

## 2014-05-24 LAB — CBC WITH DIFFERENTIAL/PLATELET
BASO%: 1.8 % (ref 0.0–2.0)
Basophils Absolute: 0.1 10*3/uL (ref 0.0–0.1)
EOS%: 2.6 % (ref 0.0–7.0)
Eosinophils Absolute: 0.2 10*3/uL (ref 0.0–0.5)
HEMATOCRIT: 33.4 % — AB (ref 34.8–46.6)
HEMOGLOBIN: 10.7 g/dL — AB (ref 11.6–15.9)
LYMPH#: 1.2 10*3/uL (ref 0.9–3.3)
LYMPH%: 21.3 % (ref 14.0–49.7)
MCH: 29 pg (ref 25.1–34.0)
MCHC: 32 g/dL (ref 31.5–36.0)
MCV: 90.5 fL (ref 79.5–101.0)
MONO#: 0.6 10*3/uL (ref 0.1–0.9)
MONO%: 9.7 % (ref 0.0–14.0)
NEUT#: 3.7 10*3/uL (ref 1.5–6.5)
NEUT%: 64.6 % (ref 38.4–76.8)
Platelets: 288 10*3/uL (ref 145–400)
RBC: 3.69 10*6/uL — ABNORMAL LOW (ref 3.70–5.45)
RDW: 20.4 % — ABNORMAL HIGH (ref 11.2–14.5)
WBC: 5.7 10*3/uL (ref 3.9–10.3)

## 2014-05-24 MED ORDER — ONDANSETRON 8 MG/NS 50 ML IVPB
INTRAVENOUS | Status: AC
  Filled 2014-05-24: qty 8

## 2014-05-24 MED ORDER — HEPARIN SOD (PORK) LOCK FLUSH 100 UNIT/ML IV SOLN
500.0000 [IU] | Freq: Once | INTRAVENOUS | Status: AC | PRN
  Administered 2014-05-24: 500 [IU]
  Filled 2014-05-24: qty 5

## 2014-05-24 MED ORDER — PACLITAXEL PROTEIN-BOUND CHEMO INJECTION 100 MG
85.0000 mg/m2 | Freq: Once | INTRAVENOUS | Status: AC
  Administered 2014-05-24: 225 mg via INTRAVENOUS
  Filled 2014-05-24: qty 45

## 2014-05-24 MED ORDER — SODIUM CHLORIDE 0.9 % IJ SOLN
10.0000 mL | INTRAMUSCULAR | Status: DC | PRN
  Administered 2014-05-24: 10 mL
  Filled 2014-05-24: qty 10

## 2014-05-24 MED ORDER — ONDANSETRON 8 MG/50ML IVPB (CHCC)
8.0000 mg | Freq: Once | INTRAVENOUS | Status: AC
  Administered 2014-05-24: 8 mg via INTRAVENOUS

## 2014-05-24 MED ORDER — SODIUM CHLORIDE 0.9 % IV SOLN
Freq: Once | INTRAVENOUS | Status: AC
  Administered 2014-05-24: 12:00:00 via INTRAVENOUS

## 2014-05-24 NOTE — Patient Instructions (Signed)
[facility] Discharge Instructions for Patients Receiving Chemotherapy  Today you received the following chemotherapy agents :  Abraxane.  To help prevent nausea and vomiting after your treatment, we encourage you to take your nausea medication as prescribed by your physician.   If you develop nausea and vomiting that is not controlled by your nausea medication, call the clinic.   BELOW ARE SYMPTOMS THAT SHOULD BE REPORTED IMMEDIATELY:  *FEVER GREATER THAN 100.5 F  *CHILLS WITH OR WITHOUT FEVER  NAUSEA AND VOMITING THAT IS NOT CONTROLLED WITH YOUR NAUSEA MEDICATION  *UNUSUAL SHORTNESS OF BREATH  *UNUSUAL BRUISING OR BLEEDING  TENDERNESS IN MOUTH AND THROAT WITH OR WITHOUT PRESENCE OF ULCERS  *URINARY PROBLEMS  *BOWEL PROBLEMS  UNUSUAL RASH Items with * indicate a potential emergency and should be followed up as soon as possible.  Feel free to call the clinic you have any questions or concerns. The clinic phone number is [phone].    

## 2014-05-24 NOTE — Telephone Encounter (Signed)
lvm for pt regarding to Jan thru march appt....mailed pt appt sched/avs and letter

## 2014-05-24 NOTE — Progress Notes (Signed)
Patient Care Team: Jacelyn Pi, MD as PCP - General (Endocrinology)  DIAGNOSIS: Breast cancer of upper-outer quadrant of right female breast   Staging form: Breast, AJCC 7th Edition     Clinical: Stage IIIA (T3, N1, M1) - Signed by Rulon Eisenmenger, MD on 03/05/2014     Pathologic: T3, N1a, M1 - Unsigned   SUMMARY OF ONCOLOGIC HISTORY:   Breast cancer of upper-outer quadrant of right female breast   02/02/2014 Mammogram Right breast: 5.8 cm irregular high-density solid mass suggestive of malignancy; left breast next millimeter internal mammary lymph node   02/04/2014 Initial Biopsy  2 biopsies were performed the right breast and axilla: Grade 2 invasive ductal carcinoma ER/PR positive HER-2 negative Ki-67 20% to 47%: Biopsy of right humerus metastatic breast cancer estrogen positive   02/17/2014 PET scan Hypermetabolic right breast cancer and right axillary lymph nodes subpectoral lymph nodes indeterminate right middle lobe pulmonary nodule and hypermetabolic proximal right humerus lesion   03/05/2014 -  Neo-Adjuvant Chemotherapy Dose dense Adriamycin and Cytoxan x4 cycles followed by Abraxane weekly x12    CHIEF COMPLIANT: week 4/12 Abraxane  INTERVAL HISTORY: Tonya Alvarez is a 69 year old lady with a history of right-sided breast cancer currently on neoadjuvant chemotherapy. She had completed 4 cycles of Adriamycin and Cytoxan and is now on Abraxane. She is tolerating Abraxane extremely well without any major problems. She did have a rash on her hands and feet related to prior chemotherapy which has subsided. She does not have any nausea vomiting issues. Denies any worsening of neuropathy. Her taste is excellent and she is eating well.  REVIEW OF SYSTEMS:   Constitutional: Denies fevers, chills or abnormal weight loss Eyes: Denies blurriness of vision Ears, nose, mouth, throat, and face: Denies mucositis or sore throat Respiratory: Denies cough, dyspnea or wheezes Cardiovascular: Denies  palpitation, chest discomfort or lower extremity swelling Gastrointestinal:  Denies nausea, heartburn or change in bowel habits Skin: Denies abnormal skin rashes Lymphatics: Denies new lymphadenopathy or easy bruising Neurological: Mild peripheral tingling and numbness unchanged from before Behavioral/Psych: Mood is stable, no new changes  All other systems were reviewed with the patient and are negative.  I have reviewed the past medical history, past surgical history, social history and family history with the patient and they are unchanged from previous note.  ALLERGIES:  is allergic to amoxicillin; lisinopril; and oxycodone.  MEDICATIONS:  Current Outpatient Prescriptions  Medication Sig Dispense Refill  . acetaminophen (TYLENOL) 500 MG tablet Take 500 mg by mouth every 6 (six) hours as needed.    Marland Kitchen aspirin 81 MG tablet Take 81 mg by mouth at bedtime.     Marland Kitchen dexamethasone (DECADRON) 4 MG tablet     . fluticasone (FLONASE) 50 MCG/ACT nasal spray Place 1 spray into both nostrils daily as needed for allergies or rhinitis. 16 g 0  . ibuprofen (ADVIL,MOTRIN) 600 MG tablet Take 600 mg by mouth every 12 (twelve) hours.    . insulin lispro protamine-lispro (HUMALOG 75/25 MIX) (75-25) 100 UNIT/ML SUSP injection Inject into the skin. Inject 60-68 units into the skin 2 times daily    . lidocaine-prilocaine (EMLA) cream Apply 1 application topically as needed. 30 g 6  . LORazepam (ATIVAN) 0.5 MG tablet Take 1 tablet (0.5 mg total) by mouth every 6 (six) hours as needed (Nausea or vomiting). 30 tablet 0  . losartan (COZAAR) 50 MG tablet Take 50 mg by mouth every morning.    . metFORMIN (GLUCOPHAGE) 500 MG tablet Take  1,000 mg by mouth 2 (two) times daily with a meal.     . Multiple Vitamin (MULTIVITAMIN WITH MINERALS) TABS tablet Take 1 tablet by mouth at bedtime.    . ondansetron (ZOFRAN) 8 MG tablet     . prochlorperazine (COMPAZINE) 10 MG tablet Take 1 tablet (10 mg total) by mouth every 6 (six)  hours as needed for nausea or vomiting. 30 tablet 0  . simvastatin (ZOCOR) 10 MG tablet Take 10 mg by mouth every evening.     Marland Kitchen tiZANidine (ZANAFLEX) 2 MG tablet Take 2 mg by mouth at bedtime. Pt takes this medication as needed     No current facility-administered medications for this visit.    PHYSICAL EXAMINATION: ECOG PERFORMANCE STATUS: 1 - Symptomatic but completely ambulatory  Filed Vitals:   05/24/14 1123  BP: 127/67  Pulse: 78  Temp: 98 F (36.7 C)  Resp: 18   Filed Weights   05/24/14 1123  Weight: 315 lb 4.8 oz (143.019 kg)    GENERAL:alert, no distress and comfortable SKIN: skin color, texture, turgor are normal, no rashes or significant lesions EYES: normal, Conjunctiva are pink and non-injected, sclera clear OROPHARYNX:no exudate, no erythema and lips, buccal mucosa, and tongue normal  NECK: supple, thyroid normal size, non-tender, without nodularity LYMPH:  no palpable lymphadenopathy in the cervical, axillary or inguinal LUNGS: clear to auscultation and percussion with normal breathing effort HEART: regular rate & rhythm and no murmurs and no lower extremity edema ABDOMEN:abdomen soft, non-tender and normal bowel sounds Musculoskeletal:no cyanosis of digits and no clubbing  NEURO: alert & oriented x 3 with fluent speech, grade 1 peripheral neuropathy   LABORATORY DATA:  I have reviewed the data as listed   Chemistry      Component Value Date/Time   NA 141 05/24/2014 1055   NA 141 02/23/2014 1105   K 4.2 05/24/2014 1055   K 4.6 02/23/2014 1105   CL 99 02/23/2014 1105   CO2 26 05/24/2014 1055   CO2 26 02/23/2014 1105   BUN 13.8 05/24/2014 1055   BUN 14 02/23/2014 1105   CREATININE 0.8 05/24/2014 1055   CREATININE 0.62 02/23/2014 1105      Component Value Date/Time   CALCIUM 9.0 05/24/2014 1055   CALCIUM 10.1 02/23/2014 1105   ALKPHOS 79 05/24/2014 1055   ALKPHOS 74 08/01/2010 1746   AST 16 05/24/2014 1055   AST 26 08/01/2010 1746   ALT 22  05/24/2014 1055   ALT 20 08/01/2010 1746   BILITOT 0.30 05/24/2014 1055   BILITOT 0.4 08/01/2010 1746       Lab Results  Component Value Date   WBC 5.7 05/24/2014   HGB 10.7* 05/24/2014   HCT 33.4* 05/24/2014   MCV 90.5 05/24/2014   PLT 288 05/24/2014   NEUTROABS 3.7 05/24/2014   ASSESSMENT & PLAN:  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 This is being followed by 12 cycles of weekly Abraxane.  Current treatment: Week 4 Abraxane today  Toxicities to chemotherapy: 1. Chemotherapy-induced fatigue 2. Chemotherapy-induced anemia: Being monitored 3. Nausea related to chemotherapy: Improved with Compazine, will renew the prescription today  4. Hyperkalemia resolved by taking low potassium diet 5. Desquamating rash on the feet: Related to chemotherapy. 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 This symptom is  stable with the reduced dosage of Abraxane to 85 mg/m from the start. Patient is applying OKeefes lotion to her feet 6.chemotherapy-induced taste changes: resolved I reviewed her blood work which is good. Monitoring her closely for toxicities  Return to clinic in 2 weeks for follow-up.    Orders Placed This Encounter  Procedures  . CBC with Differential    Standing Status: Future     Number of Occurrences:      Standing Expiration Date: 05/24/2015  . Comprehensive metabolic panel (Cmet) - [facility]    Standing Status: Future     Number of Occurrences:      Standing Expiration Date: 05/24/2015   The patient has a good understanding of the overall plan. she agrees with it. She will call with any problems that may develop before her next visit here.   Rulon Eisenmenger, MD

## 2014-05-24 NOTE — Assessment & Plan Note (Signed)
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 This is being followed by 12 cycles of weekly Abraxane.  Current treatment: Week 4 Abraxane today  Toxicities to chemotherapy: 1. Chemotherapy-induced fatigue 2. Chemotherapy-induced anemia: Being monitored 3. Nausea related to chemotherapy: Improved with Compazine, will renew the prescription today  4. Hyperkalemia resolved by taking low potassium diet 5. Desquamating rash on the feet: Related to chemotherapy. 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 This symptom is stable with the reduced dosage of Abraxane to 85 mg/m from the start. Patient is applying OKeefes lotion to her feet 6.chemotherapy-induced taste changes: resolved I reviewed her blood work which is good. Monitoring her closely for toxicities  Return to clinic in 2 weeks for follow-up.

## 2014-05-31 ENCOUNTER — Ambulatory Visit (HOSPITAL_BASED_OUTPATIENT_CLINIC_OR_DEPARTMENT_OTHER): Payer: Medicare Other

## 2014-05-31 ENCOUNTER — Other Ambulatory Visit (HOSPITAL_BASED_OUTPATIENT_CLINIC_OR_DEPARTMENT_OTHER): Payer: Medicare Other

## 2014-05-31 DIAGNOSIS — C773 Secondary and unspecified malignant neoplasm of axilla and upper limb lymph nodes: Secondary | ICD-10-CM

## 2014-05-31 DIAGNOSIS — C7951 Secondary malignant neoplasm of bone: Secondary | ICD-10-CM | POA: Diagnosis not present

## 2014-05-31 DIAGNOSIS — Z5111 Encounter for antineoplastic chemotherapy: Secondary | ICD-10-CM

## 2014-05-31 DIAGNOSIS — C50411 Malignant neoplasm of upper-outer quadrant of right female breast: Secondary | ICD-10-CM

## 2014-05-31 LAB — COMPREHENSIVE METABOLIC PANEL (CC13)
ALBUMIN: 3.8 g/dL (ref 3.5–5.0)
ALT: 20 U/L (ref 0–55)
AST: 14 U/L (ref 5–34)
Alkaline Phosphatase: 86 U/L (ref 40–150)
Anion Gap: 11 mEq/L (ref 3–11)
BUN: 15.2 mg/dL (ref 7.0–26.0)
CO2: 26 mEq/L (ref 22–29)
CREATININE: 0.8 mg/dL (ref 0.6–1.1)
Calcium: 9.4 mg/dL (ref 8.4–10.4)
Chloride: 104 mEq/L (ref 98–109)
EGFR: 83 mL/min/{1.73_m2} — AB (ref >90)
Glucose: 161 mg/dl — ABNORMAL HIGH (ref 70–140)
POTASSIUM: 4.5 meq/L (ref 3.5–5.1)
Sodium: 141 mEq/L (ref 136–145)
TOTAL PROTEIN: 6.8 g/dL (ref 6.4–8.3)
Total Bilirubin: 0.38 mg/dL (ref 0.20–1.20)

## 2014-05-31 LAB — CBC WITH DIFFERENTIAL/PLATELET
BASO%: 1 % (ref 0.0–2.0)
Basophils Absolute: 0.1 10*3/uL (ref 0.0–0.1)
EOS%: 2.6 % (ref 0.0–7.0)
Eosinophils Absolute: 0.2 10*3/uL (ref 0.0–0.5)
HCT: 34.3 % — ABNORMAL LOW (ref 34.8–46.6)
HEMOGLOBIN: 11 g/dL — AB (ref 11.6–15.9)
LYMPH%: 25.6 % (ref 14.0–49.7)
MCH: 29.4 pg (ref 25.1–34.0)
MCHC: 32 g/dL (ref 31.5–36.0)
MCV: 91.9 fL (ref 79.5–101.0)
MONO#: 0.5 10*3/uL (ref 0.1–0.9)
MONO%: 8.7 % (ref 0.0–14.0)
NEUT#: 3.9 10*3/uL (ref 1.5–6.5)
NEUT%: 62.1 % (ref 38.4–76.8)
Platelets: 297 10*3/uL (ref 145–400)
RBC: 3.74 10*6/uL (ref 3.70–5.45)
RDW: 22.2 % — ABNORMAL HIGH (ref 11.2–14.5)
WBC: 6.3 10*3/uL (ref 3.9–10.3)
lymph#: 1.6 10*3/uL (ref 0.9–3.3)

## 2014-05-31 MED ORDER — ONDANSETRON 8 MG/50ML IVPB (CHCC)
8.0000 mg | Freq: Once | INTRAVENOUS | Status: AC
  Administered 2014-05-31: 8 mg via INTRAVENOUS

## 2014-05-31 MED ORDER — SODIUM CHLORIDE 0.9 % IJ SOLN
10.0000 mL | INTRAMUSCULAR | Status: DC | PRN
  Administered 2014-05-31: 10 mL
  Filled 2014-05-31: qty 10

## 2014-05-31 MED ORDER — SODIUM CHLORIDE 0.9 % IV SOLN
Freq: Once | INTRAVENOUS | Status: AC
  Administered 2014-05-31: 10:00:00 via INTRAVENOUS

## 2014-05-31 MED ORDER — ONDANSETRON 8 MG/NS 50 ML IVPB
INTRAVENOUS | Status: AC
Start: 2014-05-31 — End: 2014-05-31
  Filled 2014-05-31: qty 8

## 2014-05-31 MED ORDER — PACLITAXEL PROTEIN-BOUND CHEMO INJECTION 100 MG
85.0000 mg/m2 | Freq: Once | INTRAVENOUS | Status: AC
  Administered 2014-05-31: 225 mg via INTRAVENOUS
  Filled 2014-05-31: qty 45

## 2014-05-31 MED ORDER — HEPARIN SOD (PORK) LOCK FLUSH 100 UNIT/ML IV SOLN
500.0000 [IU] | Freq: Once | INTRAVENOUS | Status: AC | PRN
  Administered 2014-05-31: 500 [IU]
  Filled 2014-05-31: qty 5

## 2014-05-31 NOTE — Patient Instructions (Signed)
[facility] Discharge Instructions for Patients Receiving Chemotherapy  Today you received the following chemotherapy agents Abraxane  To help prevent nausea and vomiting after your treatment, we encourage you to take your nausea medication as prescribed.   If you develop nausea and vomiting that is not controlled by your nausea medication, call the clinic.   BELOW ARE SYMPTOMS THAT SHOULD BE REPORTED IMMEDIATELY:  *FEVER GREATER THAN 100.5 F  *CHILLS WITH OR WITHOUT FEVER  NAUSEA AND VOMITING THAT IS NOT CONTROLLED WITH YOUR NAUSEA MEDICATION  *UNUSUAL SHORTNESS OF BREATH  *UNUSUAL BRUISING OR BLEEDING  TENDERNESS IN MOUTH AND THROAT WITH OR WITHOUT PRESENCE OF ULCERS  *URINARY PROBLEMS  *BOWEL PROBLEMS  UNUSUAL RASH Items with * indicate a potential emergency and should be followed up as soon as possible.  Feel free to call the clinic you have any questions or concerns. The clinic phone number is [phone].    

## 2014-06-07 ENCOUNTER — Other Ambulatory Visit (HOSPITAL_BASED_OUTPATIENT_CLINIC_OR_DEPARTMENT_OTHER): Payer: Medicare Other

## 2014-06-07 ENCOUNTER — Ambulatory Visit (HOSPITAL_BASED_OUTPATIENT_CLINIC_OR_DEPARTMENT_OTHER): Payer: Medicare Other

## 2014-06-07 DIAGNOSIS — C50411 Malignant neoplasm of upper-outer quadrant of right female breast: Secondary | ICD-10-CM

## 2014-06-07 DIAGNOSIS — C7951 Secondary malignant neoplasm of bone: Secondary | ICD-10-CM

## 2014-06-07 DIAGNOSIS — Z5111 Encounter for antineoplastic chemotherapy: Secondary | ICD-10-CM

## 2014-06-07 DIAGNOSIS — C773 Secondary and unspecified malignant neoplasm of axilla and upper limb lymph nodes: Secondary | ICD-10-CM

## 2014-06-07 DIAGNOSIS — C50919 Malignant neoplasm of unspecified site of unspecified female breast: Secondary | ICD-10-CM

## 2014-06-07 LAB — CBC WITH DIFFERENTIAL/PLATELET
BASO%: 1.7 % (ref 0.0–2.0)
Basophils Absolute: 0.1 10*3/uL (ref 0.0–0.1)
EOS%: 3.1 % (ref 0.0–7.0)
Eosinophils Absolute: 0.2 10*3/uL (ref 0.0–0.5)
HCT: 33.7 % — ABNORMAL LOW (ref 34.8–46.6)
HGB: 10.9 g/dL — ABNORMAL LOW (ref 11.6–15.9)
LYMPH%: 26.9 % (ref 14.0–49.7)
MCH: 29.8 pg (ref 25.1–34.0)
MCHC: 32.2 g/dL (ref 31.5–36.0)
MCV: 92.5 fL (ref 79.5–101.0)
MONO#: 0.6 10*3/uL (ref 0.1–0.9)
MONO%: 8.9 % (ref 0.0–14.0)
NEUT%: 59.4 % (ref 38.4–76.8)
NEUTROS ABS: 3.8 10*3/uL (ref 1.5–6.5)
Platelets: 351 10*3/uL (ref 145–400)
RBC: 3.64 10*6/uL — AB (ref 3.70–5.45)
RDW: 21.1 % — AB (ref 11.2–14.5)
WBC: 6.3 10*3/uL (ref 3.9–10.3)
lymph#: 1.7 10*3/uL (ref 0.9–3.3)

## 2014-06-07 LAB — COMPREHENSIVE METABOLIC PANEL (CC13)
ALT: 22 U/L (ref 0–55)
AST: 18 U/L (ref 5–34)
Albumin: 3.7 g/dL (ref 3.5–5.0)
Alkaline Phosphatase: 86 U/L (ref 40–150)
Anion Gap: 11 mEq/L (ref 3–11)
BUN: 12.1 mg/dL (ref 7.0–26.0)
CO2: 26 mEq/L (ref 22–29)
Calcium: 9.7 mg/dL (ref 8.4–10.4)
Chloride: 105 mEq/L (ref 98–109)
Creatinine: 0.7 mg/dL (ref 0.6–1.1)
EGFR: 84 mL/min/{1.73_m2} — AB (ref >90)
Glucose: 123 mg/dl (ref 70–140)
Potassium: 4.5 mEq/L (ref 3.5–5.1)
Sodium: 143 mEq/L (ref 136–145)
Total Bilirubin: 0.32 mg/dL (ref 0.20–1.20)
Total Protein: 6.6 g/dL (ref 6.4–8.3)

## 2014-06-07 MED ORDER — PACLITAXEL PROTEIN-BOUND CHEMO INJECTION 100 MG
85.0000 mg/m2 | Freq: Once | INTRAVENOUS | Status: AC
  Administered 2014-06-07: 225 mg via INTRAVENOUS
  Filled 2014-06-07: qty 45

## 2014-06-07 MED ORDER — SODIUM CHLORIDE 0.9 % IV SOLN
Freq: Once | INTRAVENOUS | Status: AC
  Administered 2014-06-07: 13:00:00 via INTRAVENOUS

## 2014-06-07 MED ORDER — ONDANSETRON 8 MG/50ML IVPB (CHCC)
8.0000 mg | Freq: Once | INTRAVENOUS | Status: AC
  Administered 2014-06-07: 8 mg via INTRAVENOUS

## 2014-06-07 MED ORDER — ONDANSETRON 8 MG/NS 50 ML IVPB
INTRAVENOUS | Status: AC
  Filled 2014-06-07: qty 8

## 2014-06-07 MED ORDER — SODIUM CHLORIDE 0.9 % IJ SOLN
10.0000 mL | INTRAMUSCULAR | Status: DC | PRN
  Administered 2014-06-07: 10 mL
  Filled 2014-06-07: qty 10

## 2014-06-07 MED ORDER — HEPARIN SOD (PORK) LOCK FLUSH 100 UNIT/ML IV SOLN
500.0000 [IU] | Freq: Once | INTRAVENOUS | Status: AC | PRN
  Administered 2014-06-07: 500 [IU]
  Filled 2014-06-07: qty 5

## 2014-06-07 NOTE — Patient Instructions (Signed)
[facility] Discharge Instructions for Patients Receiving Chemotherapy  Today you received the following chemotherapy agents:  Abraxane  To help prevent nausea and vomiting after your treatment, we encourage you to take your nausea medication as ordered per MD.   If you develop nausea and vomiting that is not controlled by your nausea medication, call the clinic.   BELOW ARE SYMPTOMS THAT SHOULD BE REPORTED IMMEDIATELY:  *FEVER GREATER THAN 100.5 F  *CHILLS WITH OR WITHOUT FEVER  NAUSEA AND VOMITING THAT IS NOT CONTROLLED WITH YOUR NAUSEA MEDICATION  *UNUSUAL SHORTNESS OF BREATH  *UNUSUAL BRUISING OR BLEEDING  TENDERNESS IN MOUTH AND THROAT WITH OR WITHOUT PRESENCE OF ULCERS  *URINARY PROBLEMS  *BOWEL PROBLEMS  UNUSUAL RASH Items with * indicate a potential emergency and should be followed up as soon as possible.  Feel free to call the clinic you have any questions or concerns. The clinic phone number is [phone].    

## 2014-06-14 ENCOUNTER — Ambulatory Visit (HOSPITAL_BASED_OUTPATIENT_CLINIC_OR_DEPARTMENT_OTHER): Payer: Medicare Other | Admitting: Hematology and Oncology

## 2014-06-14 ENCOUNTER — Ambulatory Visit (HOSPITAL_BASED_OUTPATIENT_CLINIC_OR_DEPARTMENT_OTHER): Payer: Medicare Other

## 2014-06-14 ENCOUNTER — Other Ambulatory Visit (HOSPITAL_BASED_OUTPATIENT_CLINIC_OR_DEPARTMENT_OTHER): Payer: Medicare Other

## 2014-06-14 ENCOUNTER — Other Ambulatory Visit: Payer: Self-pay | Admitting: *Deleted

## 2014-06-14 VITALS — BP 169/78 | HR 77 | Temp 98.6°F | Resp 20 | Ht 69.0 in | Wt 315.0 lb

## 2014-06-14 DIAGNOSIS — E1140 Type 2 diabetes mellitus with diabetic neuropathy, unspecified: Secondary | ICD-10-CM

## 2014-06-14 DIAGNOSIS — C773 Secondary and unspecified malignant neoplasm of axilla and upper limb lymph nodes: Secondary | ICD-10-CM | POA: Diagnosis not present

## 2014-06-14 DIAGNOSIS — Z5111 Encounter for antineoplastic chemotherapy: Secondary | ICD-10-CM | POA: Diagnosis not present

## 2014-06-14 DIAGNOSIS — D6481 Anemia due to antineoplastic chemotherapy: Secondary | ICD-10-CM

## 2014-06-14 DIAGNOSIS — C50411 Malignant neoplasm of upper-outer quadrant of right female breast: Secondary | ICD-10-CM

## 2014-06-14 DIAGNOSIS — C7951 Secondary malignant neoplasm of bone: Secondary | ICD-10-CM

## 2014-06-14 DIAGNOSIS — R609 Edema, unspecified: Secondary | ICD-10-CM

## 2014-06-14 DIAGNOSIS — E876 Hypokalemia: Secondary | ICD-10-CM

## 2014-06-14 LAB — CBC WITH DIFFERENTIAL/PLATELET
BASO%: 2.1 % — ABNORMAL HIGH (ref 0.0–2.0)
Basophils Absolute: 0.1 10*3/uL (ref 0.0–0.1)
EOS ABS: 0.1 10*3/uL (ref 0.0–0.5)
EOS%: 1.9 % (ref 0.0–7.0)
HCT: 33.8 % — ABNORMAL LOW (ref 34.8–46.6)
HGB: 10.8 g/dL — ABNORMAL LOW (ref 11.6–15.9)
LYMPH#: 1.2 10*3/uL (ref 0.9–3.3)
LYMPH%: 20.8 % (ref 14.0–49.7)
MCH: 29.9 pg (ref 25.1–34.0)
MCHC: 31.9 g/dL (ref 31.5–36.0)
MCV: 93.7 fL (ref 79.5–101.0)
MONO#: 0.4 10*3/uL (ref 0.1–0.9)
MONO%: 7 % (ref 0.0–14.0)
NEUT#: 3.8 10*3/uL (ref 1.5–6.5)
NEUT%: 68.2 % (ref 38.4–76.8)
Platelets: 321 10*3/uL (ref 145–400)
RBC: 3.61 10*6/uL — AB (ref 3.70–5.45)
RDW: 20.2 % — ABNORMAL HIGH (ref 11.2–14.5)
WBC: 5.6 10*3/uL (ref 3.9–10.3)

## 2014-06-14 LAB — COMPREHENSIVE METABOLIC PANEL (CC13)
ALK PHOS: 86 U/L (ref 40–150)
ALT: 17 U/L (ref 0–55)
AST: 12 U/L (ref 5–34)
Albumin: 3.7 g/dL (ref 3.5–5.0)
Anion Gap: 11 mEq/L (ref 3–11)
BUN: 13.8 mg/dL (ref 7.0–26.0)
CO2: 23 mEq/L (ref 22–29)
Calcium: 9.4 mg/dL (ref 8.4–10.4)
Chloride: 103 mEq/L (ref 98–109)
Creatinine: 0.8 mg/dL (ref 0.6–1.1)
EGFR: 83 mL/min/{1.73_m2} — ABNORMAL LOW (ref >90)
Glucose: 273 mg/dl — ABNORMAL HIGH (ref 70–140)
Potassium: 4.6 mEq/L (ref 3.5–5.1)
Sodium: 138 mEq/L (ref 136–145)
Total Bilirubin: 0.35 mg/dL (ref 0.20–1.20)
Total Protein: 6.6 g/dL (ref 6.4–8.3)

## 2014-06-14 MED ORDER — ONDANSETRON 8 MG/50ML IVPB (CHCC)
8.0000 mg | Freq: Once | INTRAVENOUS | Status: AC
  Administered 2014-06-14: 8 mg via INTRAVENOUS

## 2014-06-14 MED ORDER — PACLITAXEL PROTEIN-BOUND CHEMO INJECTION 100 MG
85.0000 mg/m2 | Freq: Once | INTRAVENOUS | Status: AC
  Administered 2014-06-14: 225 mg via INTRAVENOUS
  Filled 2014-06-14: qty 45

## 2014-06-14 MED ORDER — FUROSEMIDE 20 MG PO TABS: 20.0000 mg | ORAL_TABLET | Freq: Every day | ORAL | Status: DC

## 2014-06-14 MED ORDER — SODIUM CHLORIDE 0.9 % IV SOLN
Freq: Once | INTRAVENOUS | Status: AC
  Administered 2014-06-14: 11:00:00 via INTRAVENOUS

## 2014-06-14 MED ORDER — ONDANSETRON 8 MG/NS 50 ML IVPB
INTRAVENOUS | Status: AC
  Filled 2014-06-14: qty 8

## 2014-06-14 MED ORDER — HEPARIN SOD (PORK) LOCK FLUSH 100 UNIT/ML IV SOLN
500.0000 [IU] | Freq: Once | INTRAVENOUS | Status: DC | PRN
  Filled 2014-06-14: qty 5

## 2014-06-14 MED ORDER — SODIUM CHLORIDE 0.9 % IJ SOLN
10.0000 mL | INTRAMUSCULAR | Status: DC | PRN
  Filled 2014-06-14: qty 10

## 2014-06-14 NOTE — Assessment & Plan Note (Addendum)
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 This is being followed by 12 cycles of weekly Abraxane.  Current treatment: Week 6 Abraxane today  Toxicities to chemotherapy: 1. Chemotherapy-induced fatigue 2. Chemotherapy-induced anemia: Being monitored 3. Nausea related to chemotherapy: Improved with Compazine, will renew the prescription today  4. Hyperkalemia resolved by taking low potassium diet 5. Desquamating rash on the feet: Related to chemotherapy. 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 This symptom is stable with the reduced dosage of Abraxane to 85 mg/m from the start. Patient is applying OKeefes lotion to her feet. The rash is much better. 6.chemotherapy-induced taste changes: resolved 7. 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 It has not changed with Abraxane treatment. We are continuing to watch and monitor it. 8. Leg edema: I prescribed her Lasix 20 mg daily until the swelling goes away. We will have to watch her potassium levels on lasix.   Patient's husband was diagnosed with squamous cell carcinoma of the tongue. He is undergoing testing and evaluation and she is very much anxious and worried about him.  After the conclusion of chemotherapy, she will need a breast MRI as well as a PET CT scan for evaluation of the humerus met prior to surgery.  Monitoring her closely for toxicities  Return to clinic in 2 weeks for follow-up.

## 2014-06-14 NOTE — Progress Notes (Signed)
Patient Care Team: Jacelyn Pi, MD as PCP - General (Endocrinology)  DIAGNOSIS: Breast cancer of upper-outer quadrant of right female breast   Staging form: Breast, AJCC 7th Edition     Clinical: Stage IIIA (T3, N1, M1) - Signed by Rulon Eisenmenger, MD on 03/05/2014     Pathologic: T3, N1a, M1 - Unsigned   SUMMARY OF ONCOLOGIC HISTORY:   Breast cancer of upper-outer quadrant of right female breast   02/02/2014 Mammogram Right breast: 5.8 cm irregular high-density solid mass suggestive of malignancy; left breast next millimeter internal mammary lymph node   02/04/2014 Initial Biopsy  2 biopsies were performed the right breast and axilla: Grade 2 invasive ductal carcinoma ER/PR positive HER-2 negative Ki-67 20% to 47%: Biopsy of right humerus metastatic breast cancer estrogen positive   02/17/2014 PET scan Hypermetabolic right breast cancer and right axillary lymph nodes subpectoral lymph nodes indeterminate right middle lobe pulmonary nodule and hypermetabolic proximal right humerus lesion   03/05/2014 -  Neo-Adjuvant Chemotherapy Dose dense Adriamycin and Cytoxan x4 cycles followed by Abraxane weekly x12    CHIEF COMPLIANT: Cycle 6/12 of Abraxane  INTERVAL HISTORY: Tonya Alvarez is a 69 year old lady with above-mentioned history of right-sided breast cancer with a humerus metastases currently on neoadjuvant chemotherapy. She completed dose dense Adriamycin and Cytoxan for 4 cycles and is now on Abraxane weekly. Today is week 6. She reports that her fatigue has been getting to be a problem. Although it hasn't markedly gotten any worse since the last treatment. The rash on her feet has improved markedly. She is applying O Keefe's hand and feet lotion which has been helping her. She denies any nausea or vomiting. She does have constipation which has improved with Mirapex. Has persistent neuropathy in her feet related to diabetes it has not gotten any worse and treatment with Abraxane.  REVIEW OF  SYSTEMS:   Constitutional: Denies fevers, chills or abnormal weight loss Eyes: Denies blurriness of vision Ears, nose, mouth, throat, and face: Denies mucositis or sore throat Respiratory: Denies cough, dyspnea or wheezes Cardiovascular: Denies palpitation, chest discomfort or lower extremity swelling Gastrointestinal:  Constipation Skin: Desquamating skin rash on her feet Lymphatics: Denies new lymphadenopathy or easy bruising Neurological:Denies numbness, tingling or new weaknesses Behavioral/Psych: Mood is stable, no new changes  All other systems were reviewed with the patient and are negative.  I have reviewed the past medical history, past surgical history, social history and family history with the patient and they are unchanged from previous note.  ALLERGIES:  is allergic to amoxicillin; lisinopril; and oxycodone.  MEDICATIONS:  Current Outpatient Prescriptions  Medication Sig Dispense Refill  . acetaminophen (TYLENOL) 500 MG tablet Take 500 mg by mouth every 6 (six) hours as needed.    Marland Kitchen aspirin 81 MG tablet Take 81 mg by mouth at bedtime.     . fluticasone (FLONASE) 50 MCG/ACT nasal spray Place 1 spray into both nostrils daily as needed for allergies or rhinitis. 16 g 0  . ibuprofen (ADVIL,MOTRIN) 600 MG tablet Take 600 mg by mouth every 12 (twelve) hours.    . insulin lispro protamine-lispro (HUMALOG 75/25 MIX) (75-25) 100 UNIT/ML SUSP injection Inject into the skin. Inject 60-68 units into the skin 2 times daily    . lidocaine-prilocaine (EMLA) cream Apply 1 application topically as needed. 30 g 6  . LORazepam (ATIVAN) 0.5 MG tablet Take 1 tablet (0.5 mg total) by mouth every 6 (six) hours as needed (Nausea or vomiting). 30 tablet 0  .  losartan (COZAAR) 50 MG tablet Take 50 mg by mouth every morning.    . metFORMIN (GLUCOPHAGE) 500 MG tablet Take 1,000 mg by mouth 2 (two) times daily with a meal.     . Multiple Vitamin (MULTIVITAMIN WITH MINERALS) TABS tablet Take 1 tablet by  mouth at bedtime.    . ondansetron (ZOFRAN) 8 MG tablet     . prochlorperazine (COMPAZINE) 10 MG tablet Take 1 tablet (10 mg total) by mouth every 6 (six) hours as needed for nausea or vomiting. 30 tablet 0  . simvastatin (ZOCOR) 10 MG tablet Take 10 mg by mouth every evening.     Marland Kitchen tiZANidine (ZANAFLEX) 2 MG tablet Take 2 mg by mouth at bedtime. Pt takes this medication as needed    . furosemide (LASIX) 20 MG tablet Take 1 tablet (20 mg total) by mouth daily. 30 tablet 0   No current facility-administered medications for this visit.    PHYSICAL EXAMINATION: ECOG PERFORMANCE STATUS: 2 - Symptomatic, <50% confined to bed  Filed Vitals:   06/14/14 1002  BP: 169/78  Pulse: 77  Temp: 98.6 F (37 C)  Resp: 20   Filed Weights   06/14/14 1002  Weight: 315 lb (142.883 kg)    GENERAL:alert, no distress and comfortable SKIN: skin color, texture, turgor are normal, no rashes or significant lesions EYES: normal, Conjunctiva are pink and non-injected, sclera clear OROPHARYNX:no exudate, no erythema and lips, buccal mucosa, and tongue normal  NECK: supple, thyroid normal size, non-tender, without nodularity LYMPH:  no palpable lymphadenopathy in the cervical, axillary or inguinal LUNGS: clear to auscultation and percussion with normal breathing effort HEART: regular rate & rhythm and no murmurs and no lower extremity edema ABDOMEN:abdomen soft, non-tender and normal bowel sounds Musculoskeletal:no cyanosis of digits and no clubbing  NEURO: alert & oriented x 3 with fluent speech, no focal motor/sensory deficits   LABORATORY DATA:  I have reviewed the data as listed   Chemistry      Component Value Date/Time   NA 143 06/07/2014 1135   NA 141 02/23/2014 1105   K 4.5 06/07/2014 1135   K 4.6 02/23/2014 1105   CL 99 02/23/2014 1105   CO2 26 06/07/2014 1135   CO2 26 02/23/2014 1105   BUN 12.1 06/07/2014 1135   BUN 14 02/23/2014 1105   CREATININE 0.7 06/07/2014 1135   CREATININE 0.62  02/23/2014 1105      Component Value Date/Time   CALCIUM 9.7 06/07/2014 1135   CALCIUM 10.1 02/23/2014 1105   ALKPHOS 86 06/07/2014 1135   ALKPHOS 74 08/01/2010 1746   AST 18 06/07/2014 1135   AST 26 08/01/2010 1746   ALT 22 06/07/2014 1135   ALT 20 08/01/2010 1746   BILITOT 0.32 06/07/2014 1135   BILITOT 0.4 08/01/2010 1746       Lab Results  Component Value Date   WBC 5.6 06/14/2014   HGB 10.8* 06/14/2014   HCT 33.8* 06/14/2014   MCV 93.7 06/14/2014   PLT 321 06/14/2014   NEUTROABS 3.8 06/14/2014    ASSESSMENT & PLAN:  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 This is being followed by 12 cycles of weekly Abraxane.  Current treatment: Week 6 Abraxane today  Toxicities to chemotherapy: 1. Chemotherapy-induced fatigue 2. Chemotherapy-induced anemia: Being monitored  3. Nausea related to chemotherapy: Improved with Compazine, will renew the prescription today  4. Hyperkalemia resolved by taking low potassium diet 5. Desquamating rash on the feet: Related to chemotherapy. 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 This symptom is stable with the reduced dosage of Abraxane to 85 mg/m from the start. Patient is applying OKeefes lotion to her feet. The rash is much better. 6.chemotherapy-induced taste changes: resolved 7. 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 It has not changed with Abraxane treatment. We are continuing to watch and monitor it. 8. Leg edema: I prescribed her Lasix 20 mg daily until the swelling goes away. We will have to watch her potassium levels on lasix.   Patient's husband was diagnosed with squamous cell carcinoma of the tongue. He is undergoing testing and evaluation and she is very much anxious and worried about  him.  After the conclusion of chemotherapy, she will need a breast MRI as well as a PET CT scan for evaluation of the humerus met prior to surgery.  Monitoring her closely for toxicities  Return to clinic in 2 weeks for follow-up.    No orders of the defined types were placed in this encounter.   The patient has a good understanding of the overall plan. she agrees with it. She will call with any problems that may develop before her next visit here.   Rulon Eisenmenger, MD

## 2014-06-14 NOTE — Patient Instructions (Signed)
[facility] Discharge Instructions for Patients Receiving Chemotherapy  Today you received the following chemotherapy agents: Abraxane.  To help prevent nausea and vomiting after your treatment, we encourage you to take your nausea medication as directed  If you develop nausea and vomiting that is not controlled by your nausea medication, call the clinic.   BELOW ARE SYMPTOMS THAT SHOULD BE REPORTED IMMEDIATELY:  *FEVER GREATER THAN 100.5 F  *CHILLS WITH OR WITHOUT FEVER  NAUSEA AND VOMITING THAT IS NOT CONTROLLED WITH YOUR NAUSEA MEDICATION  *UNUSUAL SHORTNESS OF BREATH  *UNUSUAL BRUISING OR BLEEDING  TENDERNESS IN MOUTH AND THROAT WITH OR WITHOUT PRESENCE OF ULCERS  *URINARY PROBLEMS  *BOWEL PROBLEMS  UNUSUAL RASH Items with * indicate a potential emergency and should be followed up as soon as possible.  Feel free to call the clinic you have any questions or concerns. The clinic phone number is [phone].  

## 2014-06-15 ENCOUNTER — Telehealth: Payer: Self-pay | Admitting: Hematology and Oncology

## 2014-06-15 NOTE — Telephone Encounter (Signed)
, °

## 2014-06-17 DIAGNOSIS — H40013 Open angle with borderline findings, low risk, bilateral: Secondary | ICD-10-CM | POA: Diagnosis not present

## 2014-06-17 DIAGNOSIS — E119 Type 2 diabetes mellitus without complications: Secondary | ICD-10-CM | POA: Diagnosis not present

## 2014-06-18 ENCOUNTER — Other Ambulatory Visit: Payer: Self-pay | Admitting: Emergency Medicine

## 2014-06-18 DIAGNOSIS — C50411 Malignant neoplasm of upper-outer quadrant of right female breast: Secondary | ICD-10-CM

## 2014-06-21 ENCOUNTER — Ambulatory Visit (HOSPITAL_BASED_OUTPATIENT_CLINIC_OR_DEPARTMENT_OTHER): Payer: Medicare Other

## 2014-06-21 ENCOUNTER — Other Ambulatory Visit (HOSPITAL_BASED_OUTPATIENT_CLINIC_OR_DEPARTMENT_OTHER): Payer: Medicare Other

## 2014-06-21 DIAGNOSIS — Z5111 Encounter for antineoplastic chemotherapy: Secondary | ICD-10-CM | POA: Diagnosis not present

## 2014-06-21 DIAGNOSIS — C50411 Malignant neoplasm of upper-outer quadrant of right female breast: Secondary | ICD-10-CM

## 2014-06-21 LAB — COMPREHENSIVE METABOLIC PANEL (CC13)
ALT: 18 U/L (ref 0–55)
ANION GAP: 9 meq/L (ref 3–11)
AST: 15 U/L (ref 5–34)
Albumin: 3.7 g/dL (ref 3.5–5.0)
Alkaline Phosphatase: 85 U/L (ref 40–150)
BUN: 17.9 mg/dL (ref 7.0–26.0)
CALCIUM: 9.2 mg/dL (ref 8.4–10.4)
CO2: 26 mEq/L (ref 22–29)
CREATININE: 0.7 mg/dL (ref 0.6–1.1)
Chloride: 106 mEq/L (ref 98–109)
EGFR: 84 mL/min/{1.73_m2} — AB (ref >90)
Glucose: 119 mg/dl (ref 70–140)
Potassium: 4 mEq/L (ref 3.5–5.1)
SODIUM: 141 meq/L (ref 136–145)
TOTAL PROTEIN: 6.6 g/dL (ref 6.4–8.3)
Total Bilirubin: 0.3 mg/dL (ref 0.20–1.20)

## 2014-06-21 LAB — CBC WITH DIFFERENTIAL/PLATELET
BASO%: 1.4 % (ref 0.0–2.0)
Basophils Absolute: 0.1 10*3/uL (ref 0.0–0.1)
EOS ABS: 0.1 10*3/uL (ref 0.0–0.5)
EOS%: 1.4 % (ref 0.0–7.0)
HCT: 34.3 % — ABNORMAL LOW (ref 34.8–46.6)
HGB: 11 g/dL — ABNORMAL LOW (ref 11.6–15.9)
LYMPH%: 25.1 % (ref 14.0–49.7)
MCH: 30.2 pg (ref 25.1–34.0)
MCHC: 32.1 g/dL (ref 31.5–36.0)
MCV: 94.1 fL (ref 79.5–101.0)
MONO#: 0.4 10*3/uL (ref 0.1–0.9)
MONO%: 7.7 % (ref 0.0–14.0)
NEUT#: 3.6 10*3/uL (ref 1.5–6.5)
NEUT%: 64.4 % (ref 38.4–76.8)
Platelets: 311 10*3/uL (ref 145–400)
RBC: 3.64 10*6/uL — ABNORMAL LOW (ref 3.70–5.45)
RDW: 19.7 % — ABNORMAL HIGH (ref 11.2–14.5)
WBC: 5.6 10*3/uL (ref 3.9–10.3)
lymph#: 1.4 10*3/uL (ref 0.9–3.3)

## 2014-06-21 MED ORDER — PACLITAXEL PROTEIN-BOUND CHEMO INJECTION 100 MG
85.0000 mg/m2 | Freq: Once | INTRAVENOUS | Status: AC
  Administered 2014-06-21: 225 mg via INTRAVENOUS
  Filled 2014-06-21: qty 45

## 2014-06-21 MED ORDER — ONDANSETRON 8 MG/NS 50 ML IVPB
INTRAVENOUS | Status: AC
  Filled 2014-06-21: qty 8

## 2014-06-21 MED ORDER — ONDANSETRON 8 MG/50ML IVPB (CHCC)
8.0000 mg | Freq: Once | INTRAVENOUS | Status: AC
  Administered 2014-06-21: 8 mg via INTRAVENOUS

## 2014-06-21 MED ORDER — HEPARIN SOD (PORK) LOCK FLUSH 100 UNIT/ML IV SOLN
500.0000 [IU] | Freq: Once | INTRAVENOUS | Status: AC | PRN
  Administered 2014-06-21: 500 [IU]
  Filled 2014-06-21: qty 5

## 2014-06-21 MED ORDER — SODIUM CHLORIDE 0.9 % IV SOLN
Freq: Once | INTRAVENOUS | Status: AC
  Administered 2014-06-21: 10:00:00 via INTRAVENOUS

## 2014-06-21 MED ORDER — SODIUM CHLORIDE 0.9 % IJ SOLN
10.0000 mL | INTRAMUSCULAR | Status: DC | PRN
  Administered 2014-06-21: 10 mL
  Filled 2014-06-21: qty 10

## 2014-06-21 NOTE — Patient Instructions (Signed)
[facility] Discharge Instructions for Patients Receiving Chemotherapy  Today you received the following chemotherapy agents:  Abraxane  To help prevent nausea and vomiting after your treatment, we encourage you to take your nausea medication as ordered per MD.   If you develop nausea and vomiting that is not controlled by your nausea medication, call the clinic.   BELOW ARE SYMPTOMS THAT SHOULD BE REPORTED IMMEDIATELY:  *FEVER GREATER THAN 100.5 F  *CHILLS WITH OR WITHOUT FEVER  NAUSEA AND VOMITING THAT IS NOT CONTROLLED WITH YOUR NAUSEA MEDICATION  *UNUSUAL SHORTNESS OF BREATH  *UNUSUAL BRUISING OR BLEEDING  TENDERNESS IN MOUTH AND THROAT WITH OR WITHOUT PRESENCE OF ULCERS  *URINARY PROBLEMS  *BOWEL PROBLEMS  UNUSUAL RASH Items with * indicate a potential emergency and should be followed up as soon as possible.  Feel free to call the clinic you have any questions or concerns. The clinic phone number is [phone].    

## 2014-06-25 ENCOUNTER — Other Ambulatory Visit: Payer: Self-pay

## 2014-06-25 ENCOUNTER — Other Ambulatory Visit: Payer: Self-pay | Admitting: Hematology and Oncology

## 2014-06-25 DIAGNOSIS — C50411 Malignant neoplasm of upper-outer quadrant of right female breast: Secondary | ICD-10-CM

## 2014-06-25 MED ORDER — LORAZEPAM 0.5 MG PO TABS: 0.5000 mg | ORAL_TABLET | Freq: Four times a day (QID) | ORAL | Status: DC | PRN

## 2014-06-28 ENCOUNTER — Ambulatory Visit (HOSPITAL_BASED_OUTPATIENT_CLINIC_OR_DEPARTMENT_OTHER): Payer: Medicare Other

## 2014-06-28 ENCOUNTER — Telehealth: Payer: Self-pay | Admitting: *Deleted

## 2014-06-28 ENCOUNTER — Other Ambulatory Visit: Payer: Medicare Other

## 2014-06-28 ENCOUNTER — Other Ambulatory Visit (HOSPITAL_BASED_OUTPATIENT_CLINIC_OR_DEPARTMENT_OTHER): Payer: Medicare Other

## 2014-06-28 ENCOUNTER — Ambulatory Visit (HOSPITAL_BASED_OUTPATIENT_CLINIC_OR_DEPARTMENT_OTHER): Payer: Medicare Other | Admitting: Hematology and Oncology

## 2014-06-28 ENCOUNTER — Telehealth: Payer: Self-pay | Admitting: Hematology and Oncology

## 2014-06-28 VITALS — BP 140/68 | HR 76 | Temp 98.7°F | Resp 20 | Ht 69.0 in | Wt 315.1 lb

## 2014-06-28 DIAGNOSIS — C7951 Secondary malignant neoplasm of bone: Secondary | ICD-10-CM | POA: Diagnosis not present

## 2014-06-28 DIAGNOSIS — C50411 Malignant neoplasm of upper-outer quadrant of right female breast: Secondary | ICD-10-CM | POA: Diagnosis not present

## 2014-06-28 DIAGNOSIS — D6481 Anemia due to antineoplastic chemotherapy: Secondary | ICD-10-CM | POA: Diagnosis not present

## 2014-06-28 DIAGNOSIS — R600 Localized edema: Secondary | ICD-10-CM

## 2014-06-28 DIAGNOSIS — C773 Secondary and unspecified malignant neoplasm of axilla and upper limb lymph nodes: Secondary | ICD-10-CM

## 2014-06-28 DIAGNOSIS — R530 Neoplastic (malignant) related fatigue: Secondary | ICD-10-CM

## 2014-06-28 DIAGNOSIS — E1140 Type 2 diabetes mellitus with diabetic neuropathy, unspecified: Secondary | ICD-10-CM | POA: Diagnosis not present

## 2014-06-28 DIAGNOSIS — R110 Nausea: Secondary | ICD-10-CM

## 2014-06-28 DIAGNOSIS — Z5111 Encounter for antineoplastic chemotherapy: Secondary | ICD-10-CM

## 2014-06-28 DIAGNOSIS — R234 Changes in skin texture: Secondary | ICD-10-CM

## 2014-06-28 LAB — COMPREHENSIVE METABOLIC PANEL (CC13)
ALK PHOS: 77 U/L (ref 40–150)
ALT: 16 U/L (ref 0–55)
ANION GAP: 11 meq/L (ref 3–11)
AST: 14 U/L (ref 5–34)
Albumin: 3.6 g/dL (ref 3.5–5.0)
BUN: 12.7 mg/dL (ref 7.0–26.0)
CO2: 26 mEq/L (ref 22–29)
CREATININE: 0.8 mg/dL (ref 0.6–1.1)
Calcium: 9.5 mg/dL (ref 8.4–10.4)
Chloride: 105 mEq/L (ref 98–109)
EGFR: 80 mL/min/{1.73_m2} — ABNORMAL LOW (ref >90)
Glucose: 179 mg/dl — ABNORMAL HIGH (ref 70–140)
Potassium: 4.4 mEq/L (ref 3.5–5.1)
Sodium: 141 mEq/L (ref 136–145)
TOTAL PROTEIN: 6.4 g/dL (ref 6.4–8.3)
Total Bilirubin: 0.33 mg/dL (ref 0.20–1.20)

## 2014-06-28 LAB — CBC WITH DIFFERENTIAL/PLATELET
BASO%: 1.1 % (ref 0.0–2.0)
Basophils Absolute: 0.1 10*3/uL (ref 0.0–0.1)
EOS%: 1.8 % (ref 0.0–7.0)
Eosinophils Absolute: 0.1 10*3/uL (ref 0.0–0.5)
HCT: 34.5 % — ABNORMAL LOW (ref 34.8–46.6)
HGB: 11.1 g/dL — ABNORMAL LOW (ref 11.6–15.9)
LYMPH%: 23.1 % (ref 14.0–49.7)
MCH: 30.9 pg (ref 25.1–34.0)
MCHC: 32.2 g/dL (ref 31.5–36.0)
MCV: 96.1 fL (ref 79.5–101.0)
MONO#: 0.5 10*3/uL (ref 0.1–0.9)
MONO%: 9.2 % (ref 0.0–14.0)
NEUT%: 64.8 % (ref 38.4–76.8)
NEUTROS ABS: 3.5 10*3/uL (ref 1.5–6.5)
PLATELETS: 281 10*3/uL (ref 145–400)
RBC: 3.59 10*6/uL — AB (ref 3.70–5.45)
RDW: 17.1 % — AB (ref 11.2–14.5)
WBC: 5.4 10*3/uL (ref 3.9–10.3)
lymph#: 1.3 10*3/uL (ref 0.9–3.3)

## 2014-06-28 MED ORDER — SODIUM CHLORIDE 0.9 % IJ SOLN
10.0000 mL | INTRAMUSCULAR | Status: DC | PRN
  Administered 2014-06-28: 10 mL
  Filled 2014-06-28: qty 10

## 2014-06-28 MED ORDER — HEPARIN SOD (PORK) LOCK FLUSH 100 UNIT/ML IV SOLN
500.0000 [IU] | Freq: Once | INTRAVENOUS | Status: AC | PRN
  Administered 2014-06-28: 500 [IU]
  Filled 2014-06-28: qty 5

## 2014-06-28 MED ORDER — SODIUM CHLORIDE 0.9 % IV SOLN
Freq: Once | INTRAVENOUS | Status: AC
  Administered 2014-06-28: 10:00:00 via INTRAVENOUS

## 2014-06-28 MED ORDER — PACLITAXEL PROTEIN-BOUND CHEMO INJECTION 100 MG
85.0000 mg/m2 | Freq: Once | INTRAVENOUS | Status: AC
  Administered 2014-06-28: 225 mg via INTRAVENOUS
  Filled 2014-06-28: qty 45

## 2014-06-28 MED ORDER — ONDANSETRON 8 MG/50ML IVPB (CHCC)
8.0000 mg | Freq: Once | INTRAVENOUS | Status: AC
  Administered 2014-06-28: 8 mg via INTRAVENOUS

## 2014-06-28 MED ORDER — ONDANSETRON 8 MG/NS 50 ML IVPB
INTRAVENOUS | Status: AC
  Filled 2014-06-28: qty 8

## 2014-06-28 NOTE — Telephone Encounter (Signed)
per opof to sch pt appt-gave pt copy of sch-sent MW email to move pt trmt to coordinate w/MD appt-pt has MY CHART to review for update

## 2014-06-28 NOTE — Progress Notes (Signed)
Patient Care Team: Jacelyn Pi, MD as PCP - General (Endocrinology)  DIAGNOSIS: Breast cancer of upper-outer quadrant of right female breast   Staging form: Breast, AJCC 7th Edition     Clinical: Stage IIIA (T3, N1, M1) - Signed by Rulon Eisenmenger, MD on 03/05/2014     Pathologic: T3, N1a, M1 - Unsigned   SUMMARY OF ONCOLOGIC HISTORY:   Breast cancer of upper-outer quadrant of right female breast   02/02/2014 Mammogram Right breast: 5.8 cm irregular high-density solid mass suggestive of malignancy; left breast next millimeter internal mammary lymph node   02/04/2014 Initial Biopsy  2 biopsies were performed the right breast and axilla: Grade 2 invasive ductal carcinoma ER/PR positive HER-2 negative Ki-67 20% to 47%: Biopsy of right humerus metastatic breast cancer estrogen positive   02/17/2014 PET scan Hypermetabolic right breast cancer and right axillary lymph nodes subpectoral lymph nodes indeterminate right middle lobe pulmonary nodule and hypermetabolic proximal right humerus lesion   03/05/2014 -  Neo-Adjuvant Chemotherapy Dose dense Adriamycin and Cytoxan x4 cycles followed by Abraxane weekly x12    CHIEF COMPLIANT: week #9 of Abraxane  INTERVAL HISTORY: Tonya Alvarez is a 69 year old lady with above-mentioned history of right-sided breast cancer currently on neoadjuvant chemotherapy. Today is week 8 of Abraxane. She is tolerating Abraxane fairly well. The rash on her feet has not cut many worse. She continues to have swelling of her feet. 20 mg of Lasix in the morning has not been able to decrease in leg swelling.  REVIEW OF SYSTEMS:   Constitutional: Denies fevers, chills or abnormal weight loss Eyes: Denies blurriness of vision Ears, nose, mouth, throat, and face: Denies mucositis or sore throat Respiratory: Denies cough, dyspnea or wheezes Cardiovascular: Denies palpitation, chest discomfort Gastrointestinal:  Denies nausea, heartburn or change in bowel habits Skin: Denies  abnormal skin rashes Lymphatics: Denies new lymphadenopathy or easy bruising Neurological:Denies numbness, tingling or new weaknesses Extremities: Bilateral 3+ pitting edema Behavioral/Psych: Mood is stable, no new changes  All other systems were reviewed with the patient and are negative.  I have reviewed the past medical history, past surgical history, social history and family history with the patient and they are unchanged from previous note.  ALLERGIES:  is allergic to amoxicillin; lisinopril; and oxycodone.  MEDICATIONS:  Current Outpatient Prescriptions  Medication Sig Dispense Refill  . acetaminophen (TYLENOL) 500 MG tablet Take 500 mg by mouth every 6 (six) hours as needed.    Marland Kitchen aspirin 81 MG tablet Take 81 mg by mouth at bedtime.     . fluticasone (FLONASE) 50 MCG/ACT nasal spray Place 1 spray into both nostrils daily as needed for allergies or rhinitis. 16 g 0  . furosemide (LASIX) 20 MG tablet Take 1 tablet (20 mg total) by mouth daily. 30 tablet 0  . ibuprofen (ADVIL,MOTRIN) 600 MG tablet Take 600 mg by mouth every 12 (twelve) hours.    . insulin lispro protamine-lispro (HUMALOG 75/25 MIX) (75-25) 100 UNIT/ML SUSP injection Inject into the skin. Inject 60-68 units into the skin 2 times daily    . lidocaine-prilocaine (EMLA) cream Apply 1 application topically as needed. 30 g 6  . LORazepam (ATIVAN) 0.5 MG tablet Take 1 tablet (0.5 mg total) by mouth every 6 (six) hours as needed (Nausea or vomiting). 30 tablet 0  . losartan (COZAAR) 50 MG tablet Take 50 mg by mouth every morning.    . metFORMIN (GLUCOPHAGE) 500 MG tablet Take 1,000 mg by mouth 2 (two) times daily with  a meal.     . Multiple Vitamin (MULTIVITAMIN WITH MINERALS) TABS tablet Take 1 tablet by mouth at bedtime.    . ondansetron (ZOFRAN) 8 MG tablet     . prochlorperazine (COMPAZINE) 10 MG tablet Take 1 tablet (10 mg total) by mouth every 6 (six) hours as needed for nausea or vomiting. 30 tablet 0  . simvastatin  (ZOCOR) 10 MG tablet Take 10 mg by mouth every evening.     Marland Kitchen tiZANidine (ZANAFLEX) 2 MG tablet Take 2 mg by mouth at bedtime. Pt takes this medication as needed     No current facility-administered medications for this visit.    PHYSICAL EXAMINATION: ECOG PERFORMANCE STATUS: 1 - Symptomatic but completely ambulatory  Filed Vitals:   06/28/14 0928  BP: 140/68  Pulse: 76  Temp: 98.7 F (37.1 C)  Resp: 20   Filed Weights   06/28/14 0928  Weight: 315 lb 1.6 oz (142.928 kg)    GENERAL:alert, no distress and comfortable SKIN: skin color, texture, turgor are normal, no rashes or significant lesions EYES: normal, Conjunctiva are pink and non-injected, sclera clear OROPHARYNX:no exudate, no erythema and lips, buccal mucosa, and tongue normal  NECK: supple, thyroid normal size, non-tender, without nodularity LYMPH:  no palpable lymphadenopathy in the cervical, axillary or inguinal LUNGS: clear to auscultation and percussion with normal breathing effort HEART: regular rate & rhythm and no murmurs and no lower extremity edema ABDOMEN:abdomen soft, non-tender and normal bowel sounds Musculoskeletal:no cyanosis of digits and no clubbing  NEURO: alert & oriented x 3 with fluent speech, no focal motor/sensory deficits  LABORATORY DATA:  I have reviewed the data as listed   Chemistry      Component Value Date/Time   NA 141 06/21/2014 0935   NA 141 02/23/2014 1105   K 4.0 06/21/2014 0935   K 4.6 02/23/2014 1105   CL 99 02/23/2014 1105   CO2 26 06/21/2014 0935   CO2 26 02/23/2014 1105   BUN 17.9 06/21/2014 0935   BUN 14 02/23/2014 1105   CREATININE 0.7 06/21/2014 0935   CREATININE 0.62 02/23/2014 1105      Component Value Date/Time   CALCIUM 9.2 06/21/2014 0935   CALCIUM 10.1 02/23/2014 1105   ALKPHOS 85 06/21/2014 0935   ALKPHOS 74 08/01/2010 1746   AST 15 06/21/2014 0935   AST 26 08/01/2010 1746   ALT 18 06/21/2014 0935   ALT 20 08/01/2010 1746   BILITOT 0.30 06/21/2014  0935   BILITOT 0.4 08/01/2010 1746       Lab Results  Component Value Date   WBC 5.4 06/28/2014   HGB 11.1* 06/28/2014   HCT 34.5* 06/28/2014   MCV 96.1 06/28/2014   PLT 281 06/28/2014   NEUTROABS 3.5 06/28/2014   ASSESSMENT & PLAN:  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 This is being followed by 12 cycles of weekly Abraxane.  Current treatment: Week 9/12 Abraxane today  Toxicities to chemotherapy: 1. Chemotherapy-induced fatigue 2. Chemotherapy-induced anemia: Being monitored today's hemoglobin 11.1 3. Nausea related to chemotherapy: Improved with Compazine 4. Hyperkalemia resolved by taking low the morning following she was seen to correlate when injected and she is persistent or lower extremity weakness is much worse she is a 2 person assist even standpotassium diet 5. Desquamating rash on the  feet: Related to chemotherapy. 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 This symptom is stable with the reduced dosage of Abraxane to 85 mg/m from the start. Patient is applying OKeefes lotion to her feet. The rash is much better. 6.chemotherapy-induced taste changes: resolved 7. 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 It has not changed with Abraxane treatment. We are continuing to watch and monitor it. 8. Leg edema: not much improvement. I would increase Lasix to 40 mg in the morning. Monitored closely for chemotherapy toxicities Return to clinic in 2 weeks for clinic follow-up in weekly for Abraxane.  No orders of the defined types were placed in this encounter.   The patient has a good understanding of the overall plan. she agrees with it. She will call with any problems that may develop before her next visit here.   Rulon Eisenmenger, MD

## 2014-06-28 NOTE — Patient Instructions (Signed)
[facility] Dis[facility] Instructions for Patients Receiving Chemotherapy  Today you received the following chemotherapy agents Abrazane.  To help prevent nausea and vomiting after your treatment, we encourage you to take your nausea medication as prescribed..   If you develop nausea and vomiting that is not controlled by your nausea medication, call the clinic.   BELOW ARE SYMPTOMS THAT SHOULD BE REPORTED IMMEDIATELY:  *FEVER GREATER THAN 100.5 F  *CHILLS WITH OR WITHOUT FEVER  NAUSEA AND VOMITING THAT IS NOT CONTROLLED WITH YOUR NAUSEA MEDICATION  *UNUSUAL SHORTNESS OF BREATH  *UNUSUAL BRUISING OR BLEEDING  TENDERNESS IN MOUTH AND THROAT WITH OR WITHOUT PRESENCE OF ULCERS  *URINARY PROBLEMS  *BOWEL PROBLEMS  UNUSUAL RASH Items with * indicate a potential emergency and should be followed up as soon as possible.  Feel free to call the clinic you have any questions or concerns. The clinic phone number is [phone].

## 2014-06-28 NOTE — Assessment & Plan Note (Addendum)
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 This is being followed by 12 cycles of weekly Abraxane.  Current treatment: Week 9/12 Abraxane today  Toxicities to chemotherapy: 1. Chemotherapy-induced fatigue 2. Chemotherapy-induced anemia: Being monitored 3. Nausea related to chemotherapy: Improved with Compazine 4. Hyperkalemia resolved by taking low potassium diet 5. Desquamating rash on the feet: Related to chemotherapy. 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 This symptom is stable with the reduced dosage of Abraxane to 85 mg/m from the start. Patient is applying OKeefes lotion to her feet. The rash is much better. 6.chemotherapy-induced taste changes: resolved 7. 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 It has not changed with Abraxane treatment. We are continuing to watch and monitor it. 8. Leg edema: I prescribed her Lasix 20 mg daily until the swelling goes away. We will have to watch her potassium levels on lasix.  Monitored closely for chemotherapy toxicities Return to clinic in 2 weeks for clinic follow-up in weekly for Abraxane.

## 2014-06-28 NOTE — Telephone Encounter (Signed)
I have adjusted 3/7 appt 

## 2014-07-02 ENCOUNTER — Other Ambulatory Visit: Payer: Self-pay | Admitting: *Deleted

## 2014-07-02 DIAGNOSIS — C50411 Malignant neoplasm of upper-outer quadrant of right female breast: Secondary | ICD-10-CM

## 2014-07-05 ENCOUNTER — Ambulatory Visit (HOSPITAL_BASED_OUTPATIENT_CLINIC_OR_DEPARTMENT_OTHER): Payer: Medicare Other

## 2014-07-05 ENCOUNTER — Other Ambulatory Visit (HOSPITAL_BASED_OUTPATIENT_CLINIC_OR_DEPARTMENT_OTHER): Payer: Medicare Other

## 2014-07-05 DIAGNOSIS — C779 Secondary and unspecified malignant neoplasm of lymph node, unspecified: Secondary | ICD-10-CM | POA: Diagnosis not present

## 2014-07-05 DIAGNOSIS — C50411 Malignant neoplasm of upper-outer quadrant of right female breast: Secondary | ICD-10-CM

## 2014-07-05 DIAGNOSIS — C7951 Secondary malignant neoplasm of bone: Secondary | ICD-10-CM | POA: Diagnosis not present

## 2014-07-05 DIAGNOSIS — C50811 Malignant neoplasm of overlapping sites of right female breast: Secondary | ICD-10-CM

## 2014-07-05 DIAGNOSIS — Z5111 Encounter for antineoplastic chemotherapy: Secondary | ICD-10-CM

## 2014-07-05 DIAGNOSIS — C50511 Malignant neoplasm of lower-outer quadrant of right female breast: Secondary | ICD-10-CM

## 2014-07-05 LAB — CBC WITH DIFFERENTIAL/PLATELET
BASO%: 1.4 % (ref 0.0–2.0)
Basophils Absolute: 0.1 10*3/uL (ref 0.0–0.1)
EOS%: 2.3 % (ref 0.0–7.0)
Eosinophils Absolute: 0.1 10*3/uL (ref 0.0–0.5)
HCT: 33.7 % — ABNORMAL LOW (ref 34.8–46.6)
HEMOGLOBIN: 10.8 g/dL — AB (ref 11.6–15.9)
LYMPH%: 29.3 % (ref 14.0–49.7)
MCH: 30.9 pg (ref 25.1–34.0)
MCHC: 32 g/dL (ref 31.5–36.0)
MCV: 96.3 fL (ref 79.5–101.0)
MONO#: 0.3 10*3/uL (ref 0.1–0.9)
MONO%: 7.8 % (ref 0.0–14.0)
NEUT#: 2.6 10*3/uL (ref 1.5–6.5)
NEUT%: 59.2 % (ref 38.4–76.8)
PLATELETS: 295 10*3/uL (ref 145–400)
RBC: 3.5 10*6/uL — ABNORMAL LOW (ref 3.70–5.45)
RDW: 16.6 % — ABNORMAL HIGH (ref 11.2–14.5)
WBC: 4.4 10*3/uL (ref 3.9–10.3)
lymph#: 1.3 10*3/uL (ref 0.9–3.3)

## 2014-07-05 LAB — COMPREHENSIVE METABOLIC PANEL (CC13)
ALBUMIN: 3.5 g/dL (ref 3.5–5.0)
ALK PHOS: 79 U/L (ref 40–150)
ALT: 17 U/L (ref 0–55)
AST: 15 U/L (ref 5–34)
Anion Gap: 10 mEq/L (ref 3–11)
BILIRUBIN TOTAL: 0.27 mg/dL (ref 0.20–1.20)
BUN: 14.2 mg/dL (ref 7.0–26.0)
CO2: 25 mEq/L (ref 22–29)
Calcium: 9.1 mg/dL (ref 8.4–10.4)
Chloride: 106 mEq/L (ref 98–109)
Creatinine: 0.7 mg/dL (ref 0.6–1.1)
EGFR: 84 mL/min/{1.73_m2} — ABNORMAL LOW (ref >90)
Glucose: 150 mg/dl — ABNORMAL HIGH (ref 70–140)
POTASSIUM: 4.3 meq/L (ref 3.5–5.1)
Sodium: 141 mEq/L (ref 136–145)
TOTAL PROTEIN: 6.3 g/dL — AB (ref 6.4–8.3)

## 2014-07-05 MED ORDER — SODIUM CHLORIDE 0.9 % IV SOLN
Freq: Once | INTRAVENOUS | Status: AC
  Administered 2014-07-05: 11:00:00 via INTRAVENOUS

## 2014-07-05 MED ORDER — PACLITAXEL PROTEIN-BOUND CHEMO INJECTION 100 MG
85.0000 mg/m2 | Freq: Once | INTRAVENOUS | Status: AC
  Administered 2014-07-05: 225 mg via INTRAVENOUS
  Filled 2014-07-05: qty 45

## 2014-07-05 MED ORDER — SODIUM CHLORIDE 0.9 % IJ SOLN
10.0000 mL | INTRAMUSCULAR | Status: DC | PRN
  Administered 2014-07-05: 10 mL
  Filled 2014-07-05: qty 10

## 2014-07-05 MED ORDER — ONDANSETRON 8 MG/NS 50 ML IVPB
INTRAVENOUS | Status: AC
  Filled 2014-07-05: qty 8

## 2014-07-05 MED ORDER — HEPARIN SOD (PORK) LOCK FLUSH 100 UNIT/ML IV SOLN
500.0000 [IU] | Freq: Once | INTRAVENOUS | Status: AC | PRN
  Administered 2014-07-05: 500 [IU]
  Filled 2014-07-05: qty 5

## 2014-07-05 MED ORDER — ONDANSETRON 8 MG/50ML IVPB (CHCC)
8.0000 mg | Freq: Once | INTRAVENOUS | Status: AC
  Administered 2014-07-05: 8 mg via INTRAVENOUS

## 2014-07-05 NOTE — Patient Instructions (Signed)
[facility] Discharge Instructions for Patients Receiving Chemotherapy  Today you received the following chemotherapy agents Abraxane  To help prevent nausea and vomiting after your treatment, we encourage you to take your nausea medication as prescribed.   If you develop nausea and vomiting that is not controlled by your nausea medication, call the clinic.   BELOW ARE SYMPTOMS THAT SHOULD BE REPORTED IMMEDIATELY:  *FEVER GREATER THAN 100.5 F  *CHILLS WITH OR WITHOUT FEVER  NAUSEA AND VOMITING THAT IS NOT CONTROLLED WITH YOUR NAUSEA MEDICATION  *UNUSUAL SHORTNESS OF BREATH  *UNUSUAL BRUISING OR BLEEDING  TENDERNESS IN MOUTH AND THROAT WITH OR WITHOUT PRESENCE OF ULCERS  *URINARY PROBLEMS  *BOWEL PROBLEMS  UNUSUAL RASH Items with * indicate a potential emergency and should be followed up as soon as possible.  Feel free to call the clinic you have any questions or concerns. The clinic phone number is [phone].    

## 2014-07-06 ENCOUNTER — Other Ambulatory Visit: Payer: Self-pay | Admitting: Hematology and Oncology

## 2014-07-06 DIAGNOSIS — C50411 Malignant neoplasm of upper-outer quadrant of right female breast: Secondary | ICD-10-CM

## 2014-07-06 MED ORDER — FUROSEMIDE 40 MG PO TABS: 40.0000 mg | ORAL_TABLET | Freq: Every day | ORAL | Status: DC

## 2014-07-07 ENCOUNTER — Encounter: Payer: Self-pay | Admitting: *Deleted

## 2014-07-07 ENCOUNTER — Other Ambulatory Visit: Payer: Self-pay | Admitting: *Deleted

## 2014-07-09 ENCOUNTER — Other Ambulatory Visit: Payer: Self-pay

## 2014-07-09 DIAGNOSIS — C50411 Malignant neoplasm of upper-outer quadrant of right female breast: Secondary | ICD-10-CM

## 2014-07-12 ENCOUNTER — Ambulatory Visit (HOSPITAL_BASED_OUTPATIENT_CLINIC_OR_DEPARTMENT_OTHER): Payer: Medicare Other | Admitting: Hematology and Oncology

## 2014-07-12 ENCOUNTER — Telehealth: Payer: Self-pay | Admitting: Hematology and Oncology

## 2014-07-12 ENCOUNTER — Ambulatory Visit: Payer: Medicare Other | Admitting: Hematology and Oncology

## 2014-07-12 ENCOUNTER — Ambulatory Visit (HOSPITAL_BASED_OUTPATIENT_CLINIC_OR_DEPARTMENT_OTHER): Payer: Medicare Other

## 2014-07-12 ENCOUNTER — Other Ambulatory Visit: Payer: Medicare Other

## 2014-07-12 ENCOUNTER — Other Ambulatory Visit (HOSPITAL_BASED_OUTPATIENT_CLINIC_OR_DEPARTMENT_OTHER): Payer: Medicare Other

## 2014-07-12 VITALS — BP 161/85 | HR 72 | Temp 98.2°F | Resp 18 | Ht 69.0 in | Wt 317.5 lb

## 2014-07-12 DIAGNOSIS — D6481 Anemia due to antineoplastic chemotherapy: Secondary | ICD-10-CM | POA: Diagnosis not present

## 2014-07-12 DIAGNOSIS — Z170 Estrogen receptor positive status [ER+]: Secondary | ICD-10-CM | POA: Diagnosis not present

## 2014-07-12 DIAGNOSIS — C50411 Malignant neoplasm of upper-outer quadrant of right female breast: Secondary | ICD-10-CM

## 2014-07-12 DIAGNOSIS — C7951 Secondary malignant neoplasm of bone: Secondary | ICD-10-CM

## 2014-07-12 DIAGNOSIS — C773 Secondary and unspecified malignant neoplasm of axilla and upper limb lymph nodes: Secondary | ICD-10-CM | POA: Diagnosis not present

## 2014-07-12 DIAGNOSIS — E1140 Type 2 diabetes mellitus with diabetic neuropathy, unspecified: Secondary | ICD-10-CM

## 2014-07-12 DIAGNOSIS — Z5111 Encounter for antineoplastic chemotherapy: Secondary | ICD-10-CM | POA: Diagnosis not present

## 2014-07-12 LAB — COMPREHENSIVE METABOLIC PANEL (CC13)
ALT: 16 U/L (ref 0–55)
AST: 13 U/L (ref 5–34)
Albumin: 3.5 g/dL (ref 3.5–5.0)
Alkaline Phosphatase: 81 U/L (ref 40–150)
Anion Gap: 9 mEq/L (ref 3–11)
BILIRUBIN TOTAL: 0.29 mg/dL (ref 0.20–1.20)
BUN: 14.6 mg/dL (ref 7.0–26.0)
CO2: 25 mEq/L (ref 22–29)
Calcium: 9.3 mg/dL (ref 8.4–10.4)
Chloride: 107 mEq/L (ref 98–109)
Creatinine: 0.7 mg/dL (ref 0.6–1.1)
EGFR: 88 mL/min/{1.73_m2} — ABNORMAL LOW (ref >90)
Glucose: 148 mg/dl — ABNORMAL HIGH (ref 70–140)
Potassium: 4.1 mEq/L (ref 3.5–5.1)
Sodium: 142 mEq/L (ref 136–145)
Total Protein: 6.3 g/dL — ABNORMAL LOW (ref 6.4–8.3)

## 2014-07-12 LAB — CBC WITH DIFFERENTIAL/PLATELET
BASO%: 3.6 % — AB (ref 0.0–2.0)
Basophils Absolute: 0.2 10*3/uL — ABNORMAL HIGH (ref 0.0–0.1)
EOS%: 2.4 % (ref 0.0–7.0)
Eosinophils Absolute: 0.1 10*3/uL (ref 0.0–0.5)
HCT: 33.5 % — ABNORMAL LOW (ref 34.8–46.6)
HGB: 10.8 g/dL — ABNORMAL LOW (ref 11.6–15.9)
LYMPH%: 24.9 % (ref 14.0–49.7)
MCH: 30.6 pg (ref 25.1–34.0)
MCHC: 32.1 g/dL (ref 31.5–36.0)
MCV: 95.3 fL (ref 79.5–101.0)
MONO#: 0.4 10*3/uL (ref 0.1–0.9)
MONO%: 7.6 % (ref 0.0–14.0)
NEUT%: 61.5 % (ref 38.4–76.8)
NEUTROS ABS: 3 10*3/uL (ref 1.5–6.5)
Platelets: 293 10*3/uL (ref 145–400)
RBC: 3.52 10*6/uL — ABNORMAL LOW (ref 3.70–5.45)
RDW: 17 % — AB (ref 11.2–14.5)
WBC: 4.8 10*3/uL (ref 3.9–10.3)
lymph#: 1.2 10*3/uL (ref 0.9–3.3)

## 2014-07-12 MED ORDER — SODIUM CHLORIDE 0.9 % IV SOLN
Freq: Once | INTRAVENOUS | Status: AC
  Administered 2014-07-12: 10:00:00 via INTRAVENOUS

## 2014-07-12 MED ORDER — ONDANSETRON 8 MG/50ML IVPB (CHCC)
8.0000 mg | Freq: Once | INTRAVENOUS | Status: AC
  Administered 2014-07-12: 8 mg via INTRAVENOUS
  Filled 2014-07-12: qty 8

## 2014-07-12 MED ORDER — SODIUM CHLORIDE 0.9 % IJ SOLN
10.0000 mL | INTRAMUSCULAR | Status: DC | PRN
  Administered 2014-07-12: 10 mL
  Filled 2014-07-12: qty 10

## 2014-07-12 MED ORDER — HEPARIN SOD (PORK) LOCK FLUSH 100 UNIT/ML IV SOLN
500.0000 [IU] | Freq: Once | INTRAVENOUS | Status: AC | PRN
  Administered 2014-07-12: 500 [IU]
  Filled 2014-07-12: qty 5

## 2014-07-12 MED ORDER — PACLITAXEL PROTEIN-BOUND CHEMO INJECTION 100 MG
85.0000 mg/m2 | Freq: Once | INTRAVENOUS | Status: AC
  Administered 2014-07-12: 225 mg via INTRAVENOUS
  Filled 2014-07-12: qty 45

## 2014-07-12 NOTE — Telephone Encounter (Signed)
, °

## 2014-07-12 NOTE — Assessment & Plan Note (Signed)
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 This is being followed by 12 cycles of weekly Abraxane.  Current treatment: Week 11/12 Abraxane today  Toxicities to chemotherapy: 1. Chemotherapy-induced fatigue 2. Chemotherapy-induced anemia: Being monitored today's hemoglobin 11.1 3. Nausea related to chemotherapy: Improved with Compazine 4. Hyperkalemia resolved by taking low the morning following she was seen to correlate when injected and she is persistent or lower extremity weakness is much worse she is a 2 person assist even standpotassium diet 5. Desquamating rash on the feet: Related to chemotherapy. 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 This symptom is stable with the reduced dosage of Abraxane to 85 mg/m from the start. Patient is applying OKeefes lotion to her feet. The rash is much better. 6.chemotherapy-induced taste changes: resolved 7. 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 It has not changed with Abraxane treatment. We are continuing to watch and monitor it. 8. Leg edema: not much improvement. I would increase Lasix to 40 mg in the morning. Monitored closely for chemotherapy toxicities  Plan: 1. Complete neoadjuvant chemotherapy followed by 2. Breast MRI followed by 3. Tumor board presentation followed by 4. Surgery  Return to clinic after breast MRI to discuss the result.

## 2014-07-12 NOTE — Patient Instructions (Signed)
[facility] Discharge Instructions for Patients Receiving Chemotherapy  Today you received the following chemotherapy agents Abraxane   To help prevent nausea and vomiting after your treatment, we encourage you to take your nausea medication Compazine 10 mg every 6 hours as needed   If you develop nausea and vomiting that is not controlled by your nausea medication, call the clinic.   BELOW ARE SYMPTOMS THAT SHOULD BE REPORTED IMMEDIATELY:  *FEVER GREATER THAN 100.5 F  *CHILLS WITH OR WITHOUT FEVER  NAUSEA AND VOMITING THAT IS NOT CONTROLLED WITH YOUR NAUSEA MEDICATION  *UNUSUAL SHORTNESS OF BREATH  *UNUSUAL BRUISING OR BLEEDING  TENDERNESS IN MOUTH AND THROAT WITH OR WITHOUT PRESENCE OF ULCERS  *URINARY PROBLEMS  *BOWEL PROBLEMS  UNUSUAL RASH Items with * indicate a potential emergency and should be followed up as soon as possible.  Feel free to call the clinic you have any questions or concerns. The clinic phone number is [phone].    

## 2014-07-12 NOTE — Progress Notes (Signed)
Patient Care Team: Jacelyn Pi, MD as PCP - General (Endocrinology)  DIAGNOSIS: Breast cancer of upper-outer quadrant of right female breast   Staging form: Breast, AJCC 7th Edition     Clinical: Stage IIIA (T3, N1, M1) - Signed by Rulon Eisenmenger, MD on 03/05/2014     Pathologic: T3, N1a, M1 - Unsigned   SUMMARY OF ONCOLOGIC HISTORY:   Breast cancer of upper-outer quadrant of right female breast   02/02/2014 Mammogram Right breast: 5.8 cm irregular high-density solid mass suggestive of malignancy; left breast next millimeter internal mammary lymph node   02/04/2014 Initial Biopsy  2 biopsies were performed the right breast and axilla: Grade 2 invasive ductal carcinoma ER/PR positive HER-2 negative Ki-67 20% to 47%: Biopsy of right humerus metastatic breast cancer estrogen positive   02/17/2014 PET scan Hypermetabolic right breast cancer and right axillary lymph nodes subpectoral lymph nodes indeterminate right middle lobe pulmonary nodule and hypermetabolic proximal right humerus lesion   03/05/2014 -  Neo-Adjuvant Chemotherapy Dose dense Adriamycin and Cytoxan x4 cycles followed by Abraxane weekly x12    CHIEF COMPLIANT: Week 11 Abraxane  INTERVAL HISTORY: Tonya Alvarez is a 69 year old lady with above-mentioned history of right-sided breast cancer who is currently on neoadjuvant chemotherapy and is here for week 11 of Abraxane. She had grade 1 neuropathy which has not been changed. She reports mild tingling of the tips of the fingers and toes. She's had some sinus congestion but no fevers or chills.  REVIEW OF SYSTEMS:   Constitutional: Denies fevers, chills or abnormal weight loss Eyes: Denies blurriness of vision Ears, nose, mouth, throat, and facsinus congestionspiratory: Denies cough, dyspnea or wheezes Cardiovascular: Denies palpitation, chest discomfort or lower extremity swelling Gastrointestinal:  Denies nausea, heartburn or change in bowel habits Skin: Denies abnormal skin  rashes Lymphatics: Denies new lymphadenopathy or easy bruising Neurological:Denies numbness, tingling or new weaknesses Behavioral/Psych: Mood is stable, no new changes  Breast:  denies any pain or lumps or nodules in either breasts All other systems were reviewed with the patient and are negative.  I have reviewed the past medical history, past surgical history, social history and family history with the patient and they are unchanged from previous note.  ALLERGIES:  is allergic to amoxicillin; lisinopril; and oxycodone.  MEDICATIONS:  Current Outpatient Prescriptions  Medication Sig Dispense Refill  . acetaminophen (TYLENOL) 500 MG tablet Take 500 mg by mouth every 6 (six) hours as needed.    Marland Kitchen aspirin 81 MG tablet Take 81 mg by mouth at bedtime.     . fluticasone (FLONASE) 50 MCG/ACT nasal spray Place 1 spray into both nostrils daily as needed for allergies or rhinitis. 16 g 0  . furosemide (LASIX) 40 MG tablet Take 1 tablet (40 mg total) by mouth daily. 30 tablet 0  . ibuprofen (ADVIL,MOTRIN) 600 MG tablet Take 600 mg by mouth every 12 (twelve) hours.    . insulin lispro protamine-lispro (HUMALOG 75/25 MIX) (75-25) 100 UNIT/ML SUSP injection Inject into the skin. Inject 60-68 units into the skin 2 times daily    . lidocaine-prilocaine (EMLA) cream Apply 1 application topically as needed. 30 g 6  . LORazepam (ATIVAN) 0.5 MG tablet Take 1 tablet (0.5 mg total) by mouth every 6 (six) hours as needed (Nausea or vomiting). 30 tablet 0  . losartan (COZAAR) 50 MG tablet Take 50 mg by mouth every morning.    . metFORMIN (GLUCOPHAGE) 500 MG tablet Take 1,000 mg by mouth 2 (two) times daily  with a meal.     . Multiple Vitamin (MULTIVITAMIN WITH MINERALS) TABS tablet Take 1 tablet by mouth at bedtime.    . ondansetron (ZOFRAN) 8 MG tablet     . prochlorperazine (COMPAZINE) 10 MG tablet Take 1 tablet (10 mg total) by mouth every 6 (six) hours as needed for nausea or vomiting. 30 tablet 0  .  simvastatin (ZOCOR) 10 MG tablet Take 10 mg by mouth every evening.     Marland Kitchen tiZANidine (ZANAFLEX) 2 MG tablet Take 2 mg by mouth at bedtime. Pt takes this medication as needed     No current facility-administered medications for this visit.    PHYSICAL EXAMINATION: ECOG PERFORMANCE STATUS: 1 - Symptomatic but completely ambulatory  Filed Vitals:   07/12/14 0835  BP: 161/85  Pulse: 72  Temp: 98.2 F (36.8 C)  Resp: 18   Filed Weights   07/12/14 0835  Weight: 317 lb 8 oz (144.017 kg)    GENERAL:alert, no distress and comfortable SKIN: skin color, texture, turgor are normal, no rashes or significant lesions EYES: normal, Conjunctiva are pink and non-injected, sclera clear OROPHARYNX:no exudate, no erythema and lips, buccal mucosa, and tongue normal  NECK: supple, thyroid normal size, non-tender, without nodularity LYMPH:  no palpable lymphadenopathy in the cervical, axillary or inguinal LUNGS: clear to auscultation and percussion with normal breathing effort HEART: regular rate & rhythm and no murmurs and no lower extremity edema ABDOMEN:abdomen soft, non-tender and normal bowel sounds Musculoskeletal:no cyanosis of digits and no clubbing  NEURO: alert & oriented x 3 with fluent speech, no focal motor/sensory deficits  LABORATORY DATA:  I have reviewed the data as listed   Chemistry      Component Value Date/Time   NA 142 07/12/2014 0822   NA 141 02/23/2014 1105   K 4.1 07/12/2014 0822   K 4.6 02/23/2014 1105   CL 99 02/23/2014 1105   CO2 25 07/12/2014 0822   CO2 26 02/23/2014 1105   BUN 14.6 07/12/2014 0822   BUN 14 02/23/2014 1105   CREATININE 0.7 07/12/2014 0822   CREATININE 0.62 02/23/2014 1105      Component Value Date/Time   CALCIUM 9.3 07/12/2014 0822   CALCIUM 10.1 02/23/2014 1105   ALKPHOS 81 07/12/2014 0822   ALKPHOS 74 08/01/2010 1746   AST 13 07/12/2014 0822   AST 26 08/01/2010 1746   ALT 16 07/12/2014 0822   ALT 20 08/01/2010 1746   BILITOT 0.29  07/12/2014 0822   BILITOT 0.4 08/01/2010 1746       Lab Results  Component Value Date   WBC 4.8 07/12/2014   HGB 10.8* 07/12/2014   HCT 33.5* 07/12/2014   MCV 95.3 07/12/2014   PLT 293 07/12/2014   NEUTROABS 3.0 07/12/2014   ASSESSMENT & PLAN:  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 This is being followed by 12 cycles of weekly Abraxane.  Current treatment: Week 11/12 Abraxane today  Toxicities to chemotherapy: 1. Chemotherapy-induced fatigue 2. Chemotherapy-induced anemia: Being monitored today's hemoglobin 11.1 3. Nausea related to chemotherapy: Improved with Compazine 4. Hyperkalemia resolved by taking low the morning following she was seen to correlate when injected and she is persistent or lower extremity weakness is much worse she is a 2 person assist even standpotassium diet 5. Desquamating rash on  the feet: Related to chemotherapy. 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 This symptom is stable with the reduced dosage of Abraxane to 85 mg/m from the start. Patient is applying OKeefes lotion to her feet. The rash is much better. 6.chemotherapy-induced taste changes: resolved 7. 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 It has not changed with Abraxane treatment. We are continuing to watch and monitor it. 8. Leg edema: not much improvement. I would increase Lasix to 40 mg in the morning. Monitored closely for chemotherapy toxicities  Plan: 1. Complete neoadjuvant chemotherapy followed by 2. Breast MRI followed by 3. Tumor board presentation followed by 4. Surgery  Return to clinic after breast MRI to discuss the result.     Orders Placed This Encounter  Procedures  . MR Breast Bilateral Wo Contrast    Standing Status: Future      Number of Occurrences:      Standing Expiration Date: 07/12/2015    Order Specific Question:  Reason for Exam (SYMPTOM  OR DIAGNOSIS REQUIRED)    Answer:  Post neo adjuvant chemotherapy breast cancer    Order Specific Question:  Preferred imaging location?    Answer:  [facility]    Order Specific Question:  Does the patient have a pacemaker or implanted devices?    Answer:  No    Order Specific Question:  What is the patient's sedation requirement?    Answer:  No Sedation  . MR Breast Bilateral W Contrast    Standing Status: Future     Number of Occurrences:      Standing Expiration Date: 07/12/2015    Order Specific Question:  Reason for Exam (SYMPTOM  OR DIAGNOSIS REQUIRED)    Answer:  Post neo adjuvant chemotherapy breast cancer    Order Specific Question:  Preferred imaging location?    Answer:  [facility]    Order Specific Question:  Does the patient have a pacemaker or implanted devices?    Answer:  No    Order Specific Question:  What is the patient's sedation requirement?    Answer:  No Sedation   The patient has a good understanding of the overall plan. she agrees with it. She will call with any problems that may develop before her next visit here.   Rulon Eisenmenger, MD

## 2014-07-13 ENCOUNTER — Ambulatory Visit: Payer: Medicare Other | Admitting: Hematology and Oncology

## 2014-07-14 ENCOUNTER — Ambulatory Visit: Payer: Medicare Other | Admitting: Oncology

## 2014-07-16 ENCOUNTER — Other Ambulatory Visit: Payer: Self-pay

## 2014-07-16 DIAGNOSIS — C50411 Malignant neoplasm of upper-outer quadrant of right female breast: Secondary | ICD-10-CM

## 2014-07-19 ENCOUNTER — Other Ambulatory Visit (HOSPITAL_BASED_OUTPATIENT_CLINIC_OR_DEPARTMENT_OTHER): Payer: Medicare Other

## 2014-07-19 ENCOUNTER — Ambulatory Visit (HOSPITAL_BASED_OUTPATIENT_CLINIC_OR_DEPARTMENT_OTHER): Payer: Medicare Other

## 2014-07-19 VITALS — BP 122/63 | HR 70 | Temp 98.2°F | Resp 18

## 2014-07-19 DIAGNOSIS — C50911 Malignant neoplasm of unspecified site of right female breast: Secondary | ICD-10-CM

## 2014-07-19 DIAGNOSIS — C7951 Secondary malignant neoplasm of bone: Secondary | ICD-10-CM | POA: Diagnosis not present

## 2014-07-19 DIAGNOSIS — C773 Secondary and unspecified malignant neoplasm of axilla and upper limb lymph nodes: Secondary | ICD-10-CM

## 2014-07-19 DIAGNOSIS — Z5111 Encounter for antineoplastic chemotherapy: Secondary | ICD-10-CM | POA: Diagnosis not present

## 2014-07-19 DIAGNOSIS — C50411 Malignant neoplasm of upper-outer quadrant of right female breast: Secondary | ICD-10-CM

## 2014-07-19 LAB — COMPREHENSIVE METABOLIC PANEL (CC13)
ALK PHOS: 78 U/L (ref 40–150)
ALT: 21 U/L (ref 0–55)
ANION GAP: 13 meq/L — AB (ref 3–11)
AST: 15 U/L (ref 5–34)
Albumin: 3.6 g/dL (ref 3.5–5.0)
BILIRUBIN TOTAL: 0.31 mg/dL (ref 0.20–1.20)
BUN: 12.9 mg/dL (ref 7.0–26.0)
CO2: 25 mEq/L (ref 22–29)
Calcium: 9.3 mg/dL (ref 8.4–10.4)
Chloride: 104 mEq/L (ref 98–109)
Creatinine: 0.7 mg/dL (ref 0.6–1.1)
EGFR: 85 mL/min/{1.73_m2} — ABNORMAL LOW (ref >90)
Glucose: 161 mg/dl — ABNORMAL HIGH (ref 70–140)
Potassium: 4.1 mEq/L (ref 3.5–5.1)
SODIUM: 142 meq/L (ref 136–145)
Total Protein: 6.5 g/dL (ref 6.4–8.3)

## 2014-07-19 LAB — CBC WITH DIFFERENTIAL/PLATELET
BASO%: 1.1 % (ref 0.0–2.0)
Basophils Absolute: 0.1 10*3/uL (ref 0.0–0.1)
EOS%: 1.6 % (ref 0.0–7.0)
Eosinophils Absolute: 0.1 10*3/uL (ref 0.0–0.5)
HCT: 35 % (ref 34.8–46.6)
HGB: 11.4 g/dL — ABNORMAL LOW (ref 11.6–15.9)
LYMPH%: 25.3 % (ref 14.0–49.7)
MCH: 31.4 pg (ref 25.1–34.0)
MCHC: 32.6 g/dL (ref 31.5–36.0)
MCV: 96.4 fL (ref 79.5–101.0)
MONO#: 0.4 10*3/uL (ref 0.1–0.9)
MONO%: 7.4 % (ref 0.0–14.0)
NEUT%: 64.6 % (ref 38.4–76.8)
NEUTROS ABS: 3.7 10*3/uL (ref 1.5–6.5)
Platelets: 275 10*3/uL (ref 145–400)
RBC: 3.63 10*6/uL — AB (ref 3.70–5.45)
RDW: 15.8 % — AB (ref 11.2–14.5)
WBC: 5.7 10*3/uL (ref 3.9–10.3)
lymph#: 1.4 10*3/uL (ref 0.9–3.3)

## 2014-07-19 MED ORDER — HEPARIN SOD (PORK) LOCK FLUSH 100 UNIT/ML IV SOLN
500.0000 [IU] | Freq: Once | INTRAVENOUS | Status: AC | PRN
  Administered 2014-07-19: 500 [IU]
  Filled 2014-07-19: qty 5

## 2014-07-19 MED ORDER — SODIUM CHLORIDE 0.9 % IV SOLN
Freq: Once | INTRAVENOUS | Status: AC
  Administered 2014-07-19: 11:00:00 via INTRAVENOUS
  Filled 2014-07-19: qty 4

## 2014-07-19 MED ORDER — SODIUM CHLORIDE 0.9 % IV SOLN
Freq: Once | INTRAVENOUS | Status: AC
  Administered 2014-07-19: 11:00:00 via INTRAVENOUS

## 2014-07-19 MED ORDER — SODIUM CHLORIDE 0.9 % IJ SOLN
10.0000 mL | INTRAMUSCULAR | Status: DC | PRN
  Administered 2014-07-19: 10 mL
  Filled 2014-07-19: qty 10

## 2014-07-19 MED ORDER — PACLITAXEL PROTEIN-BOUND CHEMO INJECTION 100 MG
85.0000 mg/m2 | Freq: Once | INTRAVENOUS | Status: AC
  Administered 2014-07-19: 225 mg via INTRAVENOUS
  Filled 2014-07-19: qty 45

## 2014-07-19 NOTE — Patient Instructions (Signed)
[facility] Discharge Instructions for Patients Receiving Chemotherapy  Today you received the following chemotherapy agents: Abraxane.  To help prevent nausea and vomiting after your treatment, we encourage you to take your nausea medication as directed  If you develop nausea and vomiting that is not controlled by your nausea medication, call the clinic.   BELOW ARE SYMPTOMS THAT SHOULD BE REPORTED IMMEDIATELY:  *FEVER GREATER THAN 100.5 F  *CHILLS WITH OR WITHOUT FEVER  NAUSEA AND VOMITING THAT IS NOT CONTROLLED WITH YOUR NAUSEA MEDICATION  *UNUSUAL SHORTNESS OF BREATH  *UNUSUAL BRUISING OR BLEEDING  TENDERNESS IN MOUTH AND THROAT WITH OR WITHOUT PRESENCE OF ULCERS  *URINARY PROBLEMS  *BOWEL PROBLEMS  UNUSUAL RASH Items with * indicate a potential emergency and should be followed up as soon as possible.  Feel free to call the clinic you have any questions or concerns. The clinic phone number is [phone].  

## 2014-07-25 ENCOUNTER — Other Ambulatory Visit: Payer: Self-pay | Admitting: Nurse Practitioner

## 2014-07-26 ENCOUNTER — Ambulatory Visit (HOSPITAL_COMMUNITY)
Admission: RE | Admit: 2014-07-26 | Discharge: 2014-07-26 | Disposition: A | Discharge status: Home / Self Care | Payer: Medicare Other | Source: Ambulatory Visit | Attending: Hematology and Oncology | Admitting: Hematology and Oncology

## 2014-07-26 ENCOUNTER — Other Ambulatory Visit: Payer: Self-pay | Admitting: Hematology and Oncology

## 2014-07-26 ENCOUNTER — Ambulatory Visit
Admission: RE | Admit: 2014-07-26 | Discharge: 2014-07-26 | Disposition: A | Discharge status: Home / Self Care | Payer: Medicare Other | Source: Ambulatory Visit | Attending: Hematology and Oncology | Admitting: Hematology and Oncology

## 2014-07-26 DIAGNOSIS — C50411 Malignant neoplasm of upper-outer quadrant of right female breast: Secondary | ICD-10-CM

## 2014-07-26 DIAGNOSIS — N63 Unspecified lump in breast: Secondary | ICD-10-CM | POA: Diagnosis not present

## 2014-07-26 DIAGNOSIS — Z853 Personal history of malignant neoplasm of breast: Secondary | ICD-10-CM | POA: Diagnosis not present

## 2014-07-26 MED ORDER — GADOBENATE DIMEGLUMINE 529 MG/ML IV SOLN
20.0000 mL | Freq: Once | INTRAVENOUS | Status: AC | PRN
  Administered 2014-07-26: 20 mL via INTRAVENOUS

## 2014-07-26 MED ORDER — FLUTICASONE PROPIONATE 50 MCG/ACT NA SUSP: 1.0000 | Freq: Every day | NASAL | Status: AC | PRN

## 2014-07-27 ENCOUNTER — Ambulatory Visit (HOSPITAL_COMMUNITY): Admission: RE | Admit: 2014-07-27 | Payer: Medicare Other | Source: Ambulatory Visit

## 2014-07-28 ENCOUNTER — Ambulatory Visit (HOSPITAL_BASED_OUTPATIENT_CLINIC_OR_DEPARTMENT_OTHER): Payer: Medicare Other | Admitting: Hematology and Oncology

## 2014-07-28 ENCOUNTER — Telehealth: Payer: Self-pay

## 2014-07-28 ENCOUNTER — Telehealth: Payer: Self-pay | Admitting: Hematology and Oncology

## 2014-07-28 VITALS — BP 150/82 | HR 71 | Temp 98.1°F | Resp 18 | Ht 69.0 in | Wt 317.2 lb

## 2014-07-28 DIAGNOSIS — G629 Polyneuropathy, unspecified: Secondary | ICD-10-CM

## 2014-07-28 DIAGNOSIS — C50411 Malignant neoplasm of upper-outer quadrant of right female breast: Secondary | ICD-10-CM | POA: Diagnosis not present

## 2014-07-28 DIAGNOSIS — D649 Anemia, unspecified: Secondary | ICD-10-CM | POA: Diagnosis not present

## 2014-07-28 DIAGNOSIS — C7951 Secondary malignant neoplasm of bone: Secondary | ICD-10-CM

## 2014-07-28 DIAGNOSIS — R5383 Other fatigue: Secondary | ICD-10-CM

## 2014-07-28 DIAGNOSIS — R234 Changes in skin texture: Secondary | ICD-10-CM

## 2014-07-28 DIAGNOSIS — R110 Nausea: Secondary | ICD-10-CM

## 2014-07-28 MED ORDER — ANASTROZOLE 1 MG PO TABS: 1.0000 mg | ORAL_TABLET | Freq: Every day | ORAL | Status: DC

## 2014-07-28 NOTE — Telephone Encounter (Signed)
error 

## 2014-07-28 NOTE — Assessment & Plan Note (Signed)
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 Followed by 12 cycles of weekly Abraxane completed 07/19/2014.  Chemotherapy toxicities: Fatigue, grade 1 anemia, grade 1 nausea, desquamating rash on the feet grade 2, neuropathy grade 1, leg swelling.  Post neoadjuvant MRI: Multifocal breast cancer has decreased in size but there are still enlarged lymph nodes and still residual cancer in the breast. I reviewed the before and after images with the patient. We presented her case in the breast tumor board this morning.  Multidisciplinary tumor board consensus recommendation: 1. Obtain a PET CT scan to evaluate the humerus 2. Since the patient has metastatic disease, 2 options are  A. antiestrogen therapy for stage IV breast cancer B. if no residual disease in the humerus or any other distant sites, evaluate for mastectomy followed by radiation therapy followed by antiestrogen therapy. The surgery group felt that x-ray lymph node dissection would not improve her prognosis or survival.

## 2014-07-28 NOTE — Telephone Encounter (Signed)
per pof to sch pt appt-adv pt [facility] will call to sch PET & CT

## 2014-07-28 NOTE — Progress Notes (Signed)
Patient Care Team: Jacelyn Pi, MD as PCP - General (Endocrinology)  DIAGNOSIS: Breast cancer of upper-outer quadrant of right female breast   Staging form: Breast, AJCC 7th Edition     Clinical: Stage IIIA (T3, N1, M1) - Signed by Rulon Eisenmenger, MD on 03/05/2014     Pathologic: T3, N1a, M1 - Unsigned   SUMMARY OF ONCOLOGIC HISTORY:   Breast cancer of upper-outer quadrant of right female breast   02/02/2014 Mammogram Right breast: 5.8 cm irregular high-density solid mass suggestive of malignancy; left breast next millimeter internal mammary lymph node   02/04/2014 Initial Biopsy  2 biopsies were performed the right breast and axilla: Grade 2 invasive ductal carcinoma ER/PR positive HER-2 negative Ki-67 20% to 47%: Biopsy of right humerus metastatic breast cancer estrogen positive   02/17/2014 PET scan Hypermetabolic right breast cancer and right axillary lymph nodes subpectoral lymph nodes indeterminate right middle lobe pulmonary nodule and hypermetabolic proximal right humerus lesion   03/05/2014 - 07/19/2014 Neo-Adjuvant Chemotherapy Dose dense Adriamycin and Cytoxan x4 cycles followed by Abraxane weekly x12   07/26/2014 Breast MRI Multifocal breast cancer decreased in size, retroareolar 13 mm now 11 mm, middle third 22 mm now 18 mm, 3.9 x 3.9 cm now 3.9 x 2.6 cm, multiple smaller nodules decreased in size, right x-ray lymph node 3.6 cm now 2 cm other lymph nodes are smaller    CHIEF COMPLIANT: Follow-up of breast MRI after neoadjuvant chemotherapy  INTERVAL HISTORY: Tonya Alvarez is a 69 year old with above-mentioned history of multifocal right breast cancer along with the metastatic disease in the right humerus, is here after undergoing neoadjuvant chemotherapy and had a breast MRI and he is here for follow-up. She does not have any major side effects from chemotherapy in terms of residual effects. She still feels moderately tired. Her husband is being diagnosed with head and neck cancer  and sees Dr. Alvy Bimler and is undergoing radiation therapy. She is also helping take care of her newborn grandchild. So she is extremely stressed out about the whole process. We presented her case today at the multidisciplinary breast tumor board and she is here today to discuss the result.  REVIEW OF SYSTEMS:   Constitutional: Denies fevers, chills or abnormal weight loss Eyes: Denies blurriness of vision Ears, nose, mouth, throat, and face: Denies mucositis or sore throat Respiratory: Denies cough, dyspnea or wheezes Cardiovascular: Denies palpitation, chest discomfort or lower extremity swelling Gastrointestinal:  Denies nausea, heartburn or change in bowel habits Skin: Denies abnormal skin rashes Lymphatics: Denies new lymphadenopathy or easy bruising Neurological:Denies numbness, tingling or new weaknesses Behavioral/Psych: Mood is stable, no new changes  All other systems were reviewed with the patient and are negative.  I have reviewed the past medical history, past surgical history, social history and family history with the patient and they are unchanged from previous note.  ALLERGIES:  is allergic to amoxicillin; lisinopril; and oxycodone.  MEDICATIONS:  Current Outpatient Prescriptions  Medication Sig Dispense Refill  . acetaminophen (TYLENOL) 500 MG tablet Take 500 mg by mouth every 6 (six) hours as needed.    Marland Kitchen aspirin 81 MG tablet Take 81 mg by mouth at bedtime.     . fluticasone (FLONASE) 50 MCG/ACT nasal spray Place 1 spray into both nostrils daily as needed for allergies or rhinitis. 16 g 0  . furosemide (LASIX) 40 MG tablet Take 1 tablet (40 mg total) by mouth daily. 30 tablet 0  . ibuprofen (ADVIL,MOTRIN) 600 MG tablet Take 600 mg  by mouth every 12 (twelve) hours.    . insulin lispro protamine-lispro (HUMALOG 75/25 MIX) (75-25) 100 UNIT/ML SUSP injection Inject into the skin. Inject 60-68 units into the skin 2 times daily    . lidocaine-prilocaine (EMLA) cream Apply 1  application topically as needed. 30 g 6  . LORazepam (ATIVAN) 0.5 MG tablet Take 1 tablet (0.5 mg total) by mouth every 6 (six) hours as needed (Nausea or vomiting). 30 tablet 0  . losartan (COZAAR) 50 MG tablet Take 50 mg by mouth every morning.    . metFORMIN (GLUCOPHAGE) 500 MG tablet Take 1,000 mg by mouth 2 (two) times daily with a meal.     . Multiple Vitamin (MULTIVITAMIN WITH MINERALS) TABS tablet Take 1 tablet by mouth at bedtime.    . ondansetron (ZOFRAN) 8 MG tablet     . prochlorperazine (COMPAZINE) 10 MG tablet Take 1 tablet (10 mg total) by mouth every 6 (six) hours as needed for nausea or vomiting. 30 tablet 0  . simvastatin (ZOCOR) 10 MG tablet Take 10 mg by mouth every evening.     Marland Kitchen tiZANidine (ZANAFLEX) 2 MG tablet Take 2 mg by mouth at bedtime. Pt takes this medication as needed    . anastrozole (ARIMIDEX) 1 MG tablet Take 1 tablet (1 mg total) by mouth daily. 90 tablet 3   No current facility-administered medications for this visit.    PHYSICAL EXAMINATION: ECOG PERFORMANCE STATUS: 1 - Symptomatic but completely ambulatory  Filed Vitals:   07/28/14 0934  BP: 150/82  Pulse: 71  Temp: 98.1 F (36.7 C)  Resp: 18   Filed Weights   07/28/14 0934  Weight: 317 lb 3.2 oz (143.881 kg)    GENERAL:alert, no distress and comfortable SKIN: skin color, texture, turgor are normal, no rashes or significant lesions EYES: normal, Conjunctiva are pink and non-injected, sclera clear OROPHARYNX:no exudate, no erythema and lips, buccal mucosa, and tongue normal  NECK: supple, thyroid normal size, non-tender, without nodularity LYMPH:  no palpable lymphadenopathy in the cervical, axillary or inguinal LUNGS: clear to auscultation and percussion with normal breathing effort HEART: regular rate & rhythm and no murmurs and no lower extremity edema ABDOMEN:abdomen soft, non-tender and normal bowel sounds Musculoskeletal:no cyanosis of digits and no clubbing  NEURO: alert & oriented x  3 with fluent speech, no focal motor/sensory deficits  LABORATORY DATA:  I have reviewed the data as listed   Chemistry      Component Value Date/Time   NA 142 07/19/2014 1005   NA 141 02/23/2014 1105   K 4.1 07/19/2014 1005   K 4.6 02/23/2014 1105   CL 99 02/23/2014 1105   CO2 25 07/19/2014 1005   CO2 26 02/23/2014 1105   BUN 12.9 07/19/2014 1005   BUN 14 02/23/2014 1105   CREATININE 0.7 07/19/2014 1005   CREATININE 0.62 02/23/2014 1105      Component Value Date/Time   CALCIUM 9.3 07/19/2014 1005   CALCIUM 10.1 02/23/2014 1105   ALKPHOS 78 07/19/2014 1005   ALKPHOS 74 08/01/2010 1746   AST 15 07/19/2014 1005   AST 26 08/01/2010 1746   ALT 21 07/19/2014 1005   ALT 20 08/01/2010 1746   BILITOT 0.31 07/19/2014 1005   BILITOT 0.4 08/01/2010 1746       Lab Results  Component Value Date   WBC 5.7 07/19/2014   HGB 11.4* 07/19/2014   HCT 35.0 07/19/2014   MCV 96.4 07/19/2014   PLT 275 07/19/2014   NEUTROABS 3.7  07/19/2014     RADIOGRAPHIC STUDIES: I have personally reviewed the radiology reports and agreed with their findings. 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 Followed by 12 cycles of weekly Abraxane completed 07/19/2014.  Chemotherapy toxicities: Fatigue, grade 1 anemia, grade 1 nausea, desquamating rash on the feet grade 2, neuropathy grade 1, leg swelling.  Post neoadjuvant MRI: Multifocal breast cancer has decreased in size but there are still enlarged lymph nodes and still residual cancer in the breast. I reviewed the before and after images with the patient. We presented her case in the breast tumor board this  morning.  Multidisciplinary tumor board consensus recommendation: 1. Obtain a PET CT scan to evaluate the humerus 2. Since the patient has metastatic disease, 2 options are  A. antiestrogen therapy for stage IV breast cancer B. if no residual disease in the humerus or any other distant sites, evaluate for mastectomy followed by radiation therapy followed by antiestrogen therapy. The surgery group felt that x-ray lymph node dissection would not improve her prognosis or survival.  Treatment plan: While we are awaiting the results of the PET CT scan and the decision for surgery, I elected to start her on oral antiestrogen therapy with Arimidex 1 mg daily. I discussed the risks and benefits of Arimidex including risk of hot flashes, myalgias, uterine bleeding, DVT risk, osteoporosis risk. Patient understands this risk and is willing to proceed.    Orders Placed This Encounter  Procedures  . NM PET Image Restage (PS) Whole Body    Standing Status: Future     Number of Occurrences:      Standing Expiration Date: 07/28/2015    Order Specific Question:  Reason for Exam (SYMPTOM  OR DIAGNOSIS REQUIRED)    Answer:  Stage 4 breast cancer after chemo    Order Specific Question:  Preferred imaging location?    Answer:  [facility]   The patient has a good understanding of the overall plan. she agrees with it. She will call with any problems that may develop before her next visit here.   Rulon Eisenmenger, MD

## 2014-08-02 ENCOUNTER — Encounter (HOSPITAL_COMMUNITY)
Admission: RE | Admit: 2014-08-02 | Discharge: 2014-08-02 | Disposition: A | Discharge status: Home / Self Care | Payer: Medicare Other | Source: Ambulatory Visit | Attending: Hematology and Oncology | Admitting: Hematology and Oncology

## 2014-08-02 DIAGNOSIS — C50411 Malignant neoplasm of upper-outer quadrant of right female breast: Secondary | ICD-10-CM | POA: Insufficient documentation

## 2014-08-02 DIAGNOSIS — C50911 Malignant neoplasm of unspecified site of right female breast: Secondary | ICD-10-CM | POA: Diagnosis not present

## 2014-08-02 LAB — GLUCOSE, CAPILLARY: Glucose-Capillary: 148 mg/dL — ABNORMAL HIGH (ref 70–99)

## 2014-08-02 MED ORDER — FLUDEOXYGLUCOSE F - 18 (FDG) INJECTION
15.6000 | Freq: Once | INTRAVENOUS | Status: AC | PRN
  Administered 2014-08-02: 15.6 via INTRAVENOUS

## 2014-08-06 ENCOUNTER — Encounter (HOSPITAL_COMMUNITY): Payer: Medicare Other

## 2014-08-09 ENCOUNTER — Other Ambulatory Visit: Payer: Self-pay | Admitting: Hematology and Oncology

## 2014-08-10 ENCOUNTER — Other Ambulatory Visit: Payer: Self-pay | Admitting: Hematology and Oncology

## 2014-08-10 NOTE — Telephone Encounter (Signed)
Last OV 07/27/13.  Next OV 08/11/14.  Chart reviewed

## 2014-08-11 ENCOUNTER — Ambulatory Visit (HOSPITAL_BASED_OUTPATIENT_CLINIC_OR_DEPARTMENT_OTHER): Payer: Medicare Other | Admitting: Hematology and Oncology

## 2014-08-11 VITALS — BP 143/73 | HR 79 | Temp 97.8°F | Resp 18 | Ht 69.0 in | Wt 311.5 lb

## 2014-08-11 DIAGNOSIS — C7951 Secondary malignant neoplasm of bone: Secondary | ICD-10-CM | POA: Diagnosis not present

## 2014-08-11 DIAGNOSIS — G620 Drug-induced polyneuropathy: Secondary | ICD-10-CM

## 2014-08-11 DIAGNOSIS — M7989 Other specified soft tissue disorders: Secondary | ICD-10-CM

## 2014-08-11 DIAGNOSIS — C50411 Malignant neoplasm of upper-outer quadrant of right female breast: Secondary | ICD-10-CM

## 2014-08-11 DIAGNOSIS — Z170 Estrogen receptor positive status [ER+]: Secondary | ICD-10-CM

## 2014-08-11 DIAGNOSIS — D649 Anemia, unspecified: Secondary | ICD-10-CM | POA: Diagnosis not present

## 2014-08-11 DIAGNOSIS — N951 Menopausal and female climacteric states: Secondary | ICD-10-CM

## 2014-08-11 DIAGNOSIS — R110 Nausea: Secondary | ICD-10-CM

## 2014-08-11 DIAGNOSIS — R530 Neoplastic (malignant) related fatigue: Secondary | ICD-10-CM

## 2014-08-11 DIAGNOSIS — R234 Changes in skin texture: Secondary | ICD-10-CM

## 2014-08-11 NOTE — Assessment & Plan Note (Signed)
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 Followed by 12 cycles of weekly Abraxane completed 07/19/2014.  Chemotherapy toxicities: Fatigue, grade 1 anemia, grade 1 nausea, desquamating rash on the feet grade 2, neuropathy grade 1, leg swelling.  Post neoadjuvant MRI: Multifocal breast cancer has decreased in size but there are still enlarged lymph nodes and still residual cancer in the breast. Post neoadjuvant PET/CT scan 07/25/2014: Interval response to therapy right breast mass right axillary subpectoral lymph nodes and right humeral head metastasis involving improved, no new lesion seen, incidental findings of thyroid nodularity that are stable, stable right adrenal adenoma and right renal lesion  Recommendation: Mastectomy followed by radiation therapy followed by antiestrogen therapy. The surgery group felt that axillary lymph node dissection would not improve her prognosis or survival.

## 2014-08-11 NOTE — Progress Notes (Signed)
Patient Care Team: Jacelyn Pi, MD as PCP - General (Endocrinology)  DIAGNOSIS: Breast cancer of upper-outer quadrant of right female breast   Staging form: Breast, AJCC 7th Edition     Clinical: Stage IIIA (T3, N1, M1) - Signed by Rulon Eisenmenger, MD on 03/05/2014     Pathologic: T3, N1a, M1 - Unsigned   SUMMARY OF ONCOLOGIC HISTORY:   Breast cancer of upper-outer quadrant of right female breast   02/02/2014 Mammogram Right breast: 5.8 cm irregular high-density solid mass suggestive of malignancy; left breast next millimeter internal mammary lymph node   02/04/2014 Initial Biopsy  2 biopsies were performed the right breast and axilla: Grade 2 invasive ductal carcinoma ER/PR positive HER-2 negative Ki-67 20% to 47%: Biopsy of right humerus metastatic breast cancer estrogen positive   02/17/2014 PET scan Hypermetabolic right breast cancer and right axillary lymph nodes subpectoral lymph nodes indeterminate right middle lobe pulmonary nodule and hypermetabolic proximal right humerus lesion   03/05/2014 - 07/19/2014 Neo-Adjuvant Chemotherapy Dose dense Adriamycin and Cytoxan x4 cycles followed by Abraxane weekly x12   07/26/2014 Breast MRI Multifocal breast cancer decreased in size, retroareolar 13 mm now 11 mm, middle third 22 mm now 18 mm, 3.9 x 3.9 cm now 3.9 x 2.6 cm, multiple smaller nodules decreased in size, right x-ray lymph node 3.6 cm now 2 cm other lymph nodes are smaller   08/02/2014 PET scan Interval improvement in the right breast mass and axillary/subpectoral lymph nodes and right humerus head. Several subpleural nodules are not visible, 3.4. 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 He presented  her the tumor board and the recommendation was to obtain a PET/CT to evaluate the humerus. So she underwent a PET/CT scan is here today to discuss the results. She was started on antiestrogen therapy with anastrozole and she is having hot flashes related to it. But otherwise she is tolerating it fairly well.  REVIEW OF SYSTEMS:   Constitutional: Denies fevers, chills or abnormal weight loss Eyes: Denies blurriness of vision Ears, nose, mouth, throat, and face: Denies mucositis or sore throat Respiratory: Denies cough, dyspnea or wheezes Cardiovascular: Denies palpitation, chest discomfort or lower extremity swelling Gastrointestinal:  Denies nausea, heartburn or change in bowel habits Skin: Denies abnormal skin rashes Lymphatics: Denies new lymphadenopathy or easy bruising Neurological:Denies numbness, tingling or new weaknesses Behavioral/Psych: Mood is stable, no new changes  Breast:  denies any pain or lumps or nodules in either breasts All other systems were reviewed with the patient and are negative.  I have reviewed the past medical history, past surgical history, social history and family history with the patient and they are unchanged from previous note.  ALLERGIES:  is allergic to amoxicillin; lisinopril; and oxycodone.  MEDICATIONS:  Current Outpatient Prescriptions  Medication Sig Dispense Refill  . acetaminophen (TYLENOL) 500 MG tablet Take 500 mg by mouth every 6 (six) hours as needed.    Marland Kitchen anastrozole (ARIMIDEX) 1 MG tablet Take 1 tablet (1 mg total) by mouth daily. 90 tablet 3  . aspirin 81 MG tablet Take 81 mg by mouth at bedtime.     . fluticasone (FLONASE) 50 MCG/ACT nasal spray Place 1 spray into both nostrils daily as needed for allergies or rhinitis. [place]  g 0  . furosemide (LASIX) 40 MG tablet TAKE ONE TABLET BY MOUTH DAILY 30 tablet 0  . ibuprofen (ADVIL,MOTRIN) 600 MG tablet Take 600 mg by mouth every 12 (twelve) hours.    . insulin lispro protamine-lispro (HUMALOG  75/25 MIX) (75-25) 100 UNIT/ML SUSP injection Inject into the skin. Inject 60-68 units into the skin 2 times daily    . lidocaine-prilocaine (EMLA) cream Apply 1 application topically as needed. 30 g 6  . LORazepam (ATIVAN) 0.5 MG tablet Take 1 tablet (0.5 mg total) by mouth every 6 (six) hours as needed (Nausea or vomiting). 30 tablet 0  . losartan (COZAAR) 50 MG tablet Take 50 mg by mouth every morning.    . metFORMIN (GLUCOPHAGE) 500 MG tablet Take 1,000 mg by mouth 2 (two) times daily with a meal.     . Multiple Vitamin (MULTIVITAMIN WITH MINERALS) TABS tablet Take 1 tablet by mouth at bedtime.    . ondansetron (ZOFRAN) 8 MG tablet     . prochlorperazine (COMPAZINE) 10 MG tablet Take 1 tablet (10 mg total) by mouth every 6 (six) hours as needed for nausea or vomiting. 30 tablet 0  . simvastatin (ZOCOR) 10 MG tablet Take 10 mg by mouth every evening.     Marland Kitchen tiZANidine (ZANAFLEX) 2 MG tablet Take 2 mg by mouth at bedtime. Pt takes this medication as needed     No current facility-administered medications for this visit.    PHYSICAL EXAMINATION: ECOG PERFORMANCE STATUS: 1 - Symptomatic but completely ambulatory  Filed Vitals:   08/11/14 1516  BP: 143/73  Pulse: 79  Temp: 97.8 F (36.6 C)  Resp: 18   Filed Weights   08/11/14 1516  Weight: 311 lb 8 oz (141.295 kg)    GENERAL:alert, no distress and comfortable SKIN: skin color, texture, turgor are normal, no rashes or significant lesions EYES: normal, Conjunctiva are pink and non-injected, sclera clear OROPHARYNX:no exudate, no erythema and lips, buccal mucosa, and tongue normal  NECK: supple, thyroid normal size, non-tender, without nodularity LYMPH:  no palpable lymphadenopathy in the cervical, axillary or inguinal LUNGS: clear to auscultation and percussion with normal breathing effort HEART: regular rate & rhythm and no murmurs and no lower extremity edema ABDOMEN:abdomen soft, non-tender and normal bowel  sounds Musculoskeletal:no cyanosis of digits and no clubbing  NEURO: alert & oriented x 3 with fluent speech, no focal motor/sensory deficits  LABORATORY DATA:  I have reviewed the data as listed   Chemistry      Component Value Date/Time   NA 142 07/19/2014 1005   NA 141 02/23/2014 1105   K 4.1 07/19/2014 1005   K 4.6 02/23/2014 1105   CL 99 02/23/2014 1105   CO2 25 07/19/2014 1005   CO2 26 02/23/2014 1105   BUN 12.9 07/19/2014 1005   BUN 14 02/23/2014 1105   CREATININE 0.7 07/19/2014 1005   CREATININE 0.62 02/23/2014 1105      Component Value Date/Time   CALCIUM 9.3 07/19/2014 1005   CALCIUM 10.1 02/23/2014 1105   ALKPHOS 78 07/19/2014 1005   ALKPHOS 74 08/01/2010 1746   AST 15 07/19/2014 1005   AST 26 08/01/2010 1746   ALT 21 07/19/2014 1005   ALT 20 08/01/2010 1746   BILITOT 0.31 07/19/2014 1005   BILITOT 0.4 08/01/2010 1746       Lab Results  Component Value Date   WBC 5.7 07/19/2014   HGB 11.4* 07/19/2014   HCT 35.0 07/19/2014   MCV 96.4 07/19/2014  PLT 275 07/19/2014   NEUTROABS 3.7 07/19/2014     RADIOGRAPHIC STUDIES: I have personally reviewed the radiology reports and agreed with their findings. 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 Followed by 12 cycles of weekly Abraxane completed 07/19/2014.  Chemotherapy toxicities: Fatigue, grade 1 anemia, grade 1 nausea, desquamating rash on the feet grade 2, neuropathy grade 1, leg swelling.  Post neoadjuvant MRI: Multifocal breast cancer has decreased in size but there are still enlarged lymph nodes and still residual cancer in the breast. Post neoadjuvant PET/CT scan  07/25/2014: Interval response to therapy right breast mass right axillary subpectoral lymph nodes and right humeral head metastasis involving improved, no new lesion seen, incidental findings of thyroid nodularity that are stable, stable right adrenal adenoma and right renal lesion  Recommendation: Mastectomy followed by radiation therapy followed by antiestrogen therapy. The surgery group felt that axillary lymph node dissection would not improve her prognosis or survival. I sent a message to Dr. Ninfa Linden to see her regarding mastectomy and lymph node dissection.  Anastrozole toxicities: 1. Hot flashes 2. Mild fatigue  Her husband is going through head and neck cancer radiation simultaneously.  No orders of the defined types were placed in this encounter.   The patient has a good understanding of the overall plan. she agrees with it. She will call with any problems that may develop before her next visit here.   Rulon Eisenmenger, MD

## 2014-08-12 DIAGNOSIS — I10 Essential (primary) hypertension: Secondary | ICD-10-CM | POA: Diagnosis not present

## 2014-08-12 DIAGNOSIS — E669 Obesity, unspecified: Secondary | ICD-10-CM | POA: Diagnosis not present

## 2014-08-12 DIAGNOSIS — E1165 Type 2 diabetes mellitus with hyperglycemia: Secondary | ICD-10-CM | POA: Diagnosis not present

## 2014-08-12 DIAGNOSIS — E780 Pure hypercholesterolemia: Secondary | ICD-10-CM | POA: Diagnosis not present

## 2014-08-16 ENCOUNTER — Telehealth: Payer: Self-pay

## 2014-08-16 DIAGNOSIS — C50411 Malignant neoplasm of upper-outer quadrant of right female breast: Secondary | ICD-10-CM

## 2014-08-16 MED ORDER — ANASTROZOLE 1 MG PO TABS: 1.0000 mg | ORAL_TABLET | Freq: Every day | ORAL | Status: DC

## 2014-08-16 NOTE — Telephone Encounter (Signed)
Received fax from [facility] for anastrazole. S/w pt and she is requesting we switch her from [place] to [employer] for monetary purposes. escribed to [facility]

## 2014-08-19 ENCOUNTER — Other Ambulatory Visit: Payer: Self-pay | Admitting: Surgery

## 2014-08-19 DIAGNOSIS — C50911 Malignant neoplasm of unspecified site of right female breast: Secondary | ICD-10-CM | POA: Diagnosis not present

## 2014-08-26 ENCOUNTER — Other Ambulatory Visit: Payer: Self-pay

## 2014-08-27 ENCOUNTER — Telehealth: Payer: Self-pay | Admitting: Hematology and Oncology

## 2014-08-27 NOTE — Telephone Encounter (Signed)
Called and left a message with surg.follow up appoinment

## 2014-09-06 ENCOUNTER — Other Ambulatory Visit: Payer: Self-pay | Admitting: Hematology and Oncology

## 2014-09-06 DIAGNOSIS — C50411 Malignant neoplasm of upper-outer quadrant of right female breast: Secondary | ICD-10-CM

## 2014-09-08 ENCOUNTER — Telehealth: Payer: Self-pay | Admitting: Hematology and Oncology

## 2014-09-08 NOTE — Telephone Encounter (Signed)
Patient called in to add a flush appointment

## 2014-09-10 ENCOUNTER — Ambulatory Visit (HOSPITAL_BASED_OUTPATIENT_CLINIC_OR_DEPARTMENT_OTHER): Payer: Medicare Other

## 2014-09-10 VITALS — BP 138/82 | HR 68 | Temp 98.6°F | Resp 18

## 2014-09-10 DIAGNOSIS — C50411 Malignant neoplasm of upper-outer quadrant of right female breast: Secondary | ICD-10-CM | POA: Diagnosis not present

## 2014-09-10 DIAGNOSIS — Z452 Encounter for adjustment and management of vascular access device: Secondary | ICD-10-CM | POA: Diagnosis not present

## 2014-09-10 DIAGNOSIS — Z95828 Presence of other vascular implants and grafts: Secondary | ICD-10-CM

## 2014-09-10 MED ORDER — HEPARIN SOD (PORK) LOCK FLUSH 100 UNIT/ML IV SOLN
500.0000 [IU] | Freq: Once | INTRAVENOUS | Status: AC
  Administered 2014-09-10: 500 [IU] via INTRAVENOUS
  Filled 2014-09-10: qty 5

## 2014-09-10 MED ORDER — SODIUM CHLORIDE 0.9 % IJ SOLN
10.0000 mL | INTRAMUSCULAR | Status: DC | PRN
  Administered 2014-09-10: 10 mL via INTRAVENOUS
  Filled 2014-09-10: qty 10

## 2014-09-10 NOTE — Patient Instructions (Signed)

## 2014-09-14 ENCOUNTER — Encounter (HOSPITAL_COMMUNITY): Payer: Self-pay

## 2014-09-14 ENCOUNTER — Encounter (HOSPITAL_COMMUNITY)
Admission: RE | Admit: 2014-09-14 | Discharge: 2014-09-14 | Disposition: A | Discharge status: Home / Self Care | Payer: Medicare Other | Source: Ambulatory Visit | Attending: Surgery | Admitting: Surgery

## 2014-09-14 DIAGNOSIS — I10 Essential (primary) hypertension: Secondary | ICD-10-CM | POA: Diagnosis not present

## 2014-09-14 DIAGNOSIS — Z01812 Encounter for preprocedural laboratory examination: Secondary | ICD-10-CM | POA: Diagnosis not present

## 2014-09-14 DIAGNOSIS — Z01810 Encounter for preprocedural cardiovascular examination: Secondary | ICD-10-CM | POA: Diagnosis not present

## 2014-09-14 HISTORY — DX: Other complications of anesthesia, initial encounter: T88.59XA

## 2014-09-14 HISTORY — DX: Unspecified osteoarthritis, unspecified site: M19.90

## 2014-09-14 HISTORY — DX: Adverse effect of unspecified anesthetic, initial encounter: T41.45XA

## 2014-09-14 HISTORY — DX: Essential (primary) hypertension: I10

## 2014-09-14 HISTORY — DX: Myoneural disorder, unspecified: G70.9

## 2014-09-14 LAB — BASIC METABOLIC PANEL
ANION GAP: 12 (ref 5–15)
BUN: 15 mg/dL (ref 6–20)
CHLORIDE: 101 mmol/L (ref 101–111)
CO2: 25 mmol/L (ref 22–32)
Calcium: 9.7 mg/dL (ref 8.9–10.3)
Creatinine, Ser: 0.76 mg/dL (ref 0.44–1.00)
Glucose, Bld: 194 mg/dL — ABNORMAL HIGH (ref 70–99)
POTASSIUM: 4.3 mmol/L (ref 3.5–5.1)
SODIUM: 138 mmol/L (ref 135–145)

## 2014-09-14 LAB — CBC
HCT: 38.3 % (ref 36.0–46.0)
Hemoglobin: 12.4 g/dL (ref 12.0–15.0)
MCH: 28.6 pg (ref 26.0–34.0)
MCHC: 32.4 g/dL (ref 30.0–36.0)
MCV: 88.2 fL (ref 78.0–100.0)
PLATELETS: 243 10*3/uL (ref 150–400)
RBC: 4.34 MIL/uL (ref 3.87–5.11)
RDW: 15.3 % (ref 11.5–15.5)
WBC: 6.9 10*3/uL (ref 4.0–10.5)

## 2014-09-14 NOTE — Pre-Procedure Instructions (Signed)
MACAIAH MANGAL  09/14/2014   Your procedure is scheduled on:   Wednesday  09/22/14  Report to [facility] Admitting at 630 AM.  Call this number if you have problems the morning of surgery: [phone]   Remember:   Do not eat food or drink liquids after midnight.   Take these medicines the morning of surgery with A SIP OF WATER:  TYLENOL IF NEEDED, ANASTROZOLE (ARIMIDEX), GABAPENTIN, LORAZEPAM IF NEEDED (STOP ASPIRIN, COUMADIN, PLAVIX, EFFIENT, HERBAL MEDICINES, MULTIVITAMIN, IBUPROFEN)   Do not wear jewelry, make-up or nail polish.  Do not wear lotions, powders, or perfumes. You may wear deodorant.  Do not shave 48 hours prior to surgery. Men may shave face and neck.  Do not bring valuables to the hospital.  [facility] is not responsible                  for any belongings or valuables.               Contacts, dentures or bridgework may not be worn into surgery.  Leave suitcase in the car. After surgery it may be brought to your room.  For patients admitted to the hospital, discharge time is determined by your                treatment team.               Patients discharged the day of surgery will not be allowed to drive  home.  Name and phone number of your driver:  Special Instructions: [facility] - Preparing for Surgery  Before surgery, you can play an important role.  Because skin is not sterile, your skin needs to be as free of germs as possible.  You can reduce the number of germs on you skin by washing with CHG (chlorahexidine gluconate) soap before surgery.  CHG is an antiseptic cleaner which kills germs and bonds with the skin to continue killing germs even after washing.  Please DO NOT use if you have an allergy to CHG or antibacterial soaps.  If your skin becomes reddened/irritated stop using the CHG and inform your nurse when you arrive at Short Stay.  Do not shave (including legs and underarms) for at least 48 hours prior to the first CHG shower.  You may  shave your face.  Please follow these instructions carefully:   1.  Shower with CHG Soap the night before surgery and the                                morning of Surgery.  2.  If you choose to wash your hair, wash your hair first as usual with your       normal shampoo.  3.  After you shampoo, rinse your hair and body thoroughly to remove the                      Shampoo.  4.  Use CHG as you would any other liquid soap.  You can apply chg directly       to the skin and wash gently with scrungie or a clean washcloth.  5.  Apply the CHG Soap to your body ONLY FROM THE NECK DOWN.        Do not use on open wounds or open sores.  Avoid contact with your eyes,       ears,  mouth and genitals (private parts).  Wash genitals (private parts)       with your normal soap.  6.  Wash thoroughly, paying special attention to the area where your surgery        will be performed.  7.  Thoroughly rinse your body with warm water from the neck down.  8.  DO NOT shower/wash with your normal soap after using and rinsing off       the CHG Soap.  9.  Pat yourself dry with a clean towel.            10.  Wear clean pajamas.            11.  Place clean sheets on your bed the night of your first shower and do not        sleep with pets.  Day of Surgery  Do not apply any lotions/deoderants the morning of surgery.  Please wear clean clothes to the hospital/surgery center.     Please read over the following fact sheets that you were given: Pain Booklet, Coughing and Deep Breathing and Surgical Site Infection Prevention

## 2014-09-14 NOTE — Progress Notes (Signed)
   09/14/14 1322  OBSTRUCTIVE SLEEP APNEA  Have you ever been diagnosed with sleep apnea through a sleep study? No  Do you snore loudly (loud enough to be heard through closed doors)?  0  Do you often feel tired, fatigued, or sleepy during the daytime? 0  Has anyone observed you stop breathing during your sleep? 0  Do you have, or are you being treated for high blood pressure? 1  BMI more than 35 kg/m2? 1  Age over 67 years old? 1  Neck circumference greater than 40 cm/16 inches? 1  Gender: 0

## 2014-09-21 MED ORDER — DEXTROSE 5 % IV SOLN
3.0000 g | INTRAVENOUS | Status: AC
  Administered 2014-09-22: 3 g via INTRAVENOUS
  Filled 2014-09-21: qty 3000

## 2014-09-21 NOTE — H&P (Signed)
  Tonya Alvarez  Location: [facility] Surgery Patient #: [id number] DOB: 1946/02/11 Married / Language: English / Race: White Female  History of Present Illness Patient words: Discuss surgery.  The patient is a 69 year old female who presents with breast cancer. She is here for a follow-up regarding her metastatic right breast cancer. Her most recent PET scan showed significant improvement given her neoadjuvant therapy. She still has a mildly suspicious lesion on the left side which cannot be biopsied. She has otherwise been doing well.  See Epic for other history  Medication History  Anastrozole (1MG  Tablet, Oral daily) Active. Fluticasone Propionate (50MCG/ACT Suspension, Nasal as needed) Active. Gabapentin (300MG  Capsule, Oral daily) Active. Losartan Potassium (50MG  Tablet, Oral daily) Active. Simvastatin (20MG  Tablet, Oral daily) Active. Calcium/Vitamin D/Minerals (600-200MG -UNIT Tablet, 1 (one) Oral daily) Active. Medications Reconciled  Vitals  08/19/2014 2:11 PM Weight: 309 lb Height: 69in Body Surface Area: 2.61 m Body Mass Index: 45.63 kg/m Temp.: 97.3F(Oral)  Pulse: 89 (Regular)  Resp.: 18 (Unlabored)     Physical Exam (Ksenia Kunz A. Ninfa Linden MD; 08/19/2014 2:25 PM)  General:  Well in appearance Lungs clear CV RRR Abd soft Skin without erythema Right breast mass Note:On exam, I can no longer palpate any axillary adenopathy.    Assessment & Plan  BREAST CANCER, RIGHT (174.9  C50.911)  Impression: Patient with stage IV breast cancer. I had a long discussion with the patient and she is also seen Dr. Lindi Adie. After we all discussed this, she would like to go ahead and proceed with a right breast mastectomy as well as complete axillary dissection. She also wants a left breast mastectomy given the suspicious lesion which cannot be biopsied. I discussed this with her in detail. I discussed all surgical risks in detail. At this point she  will be scheduled for surgery. She understands that this will not change her outcome but might provide palliation.  Her risks include but are not limited to bleeding, infection, injury to surrounding structures, prolonged need for drains, arm swelling, flap necrosis, DVT, cardiopulmonary issues, etc.

## 2014-09-22 ENCOUNTER — Observation Stay (HOSPITAL_COMMUNITY)
Admission: RE | Admit: 2014-09-22 | Discharge: 2014-09-23 | Disposition: A | Discharge status: Home / Self Care | Payer: Medicare Other | Source: Ambulatory Visit | Attending: Surgery | Admitting: Surgery

## 2014-09-22 ENCOUNTER — Ambulatory Visit (HOSPITAL_COMMUNITY): Payer: Medicare Other | Admitting: Certified Registered"

## 2014-09-22 ENCOUNTER — Encounter (HOSPITAL_COMMUNITY): Payer: Self-pay | Admitting: *Deleted

## 2014-09-22 ENCOUNTER — Encounter (HOSPITAL_COMMUNITY)
Admission: RE | Disposition: A | Discharge status: Home / Self Care | Payer: Self-pay | Source: Ambulatory Visit | Attending: Surgery

## 2014-09-22 DIAGNOSIS — C50912 Malignant neoplasm of unspecified site of left female breast: Secondary | ICD-10-CM | POA: Diagnosis not present

## 2014-09-22 DIAGNOSIS — Z9851 Tubal ligation status: Secondary | ICD-10-CM | POA: Insufficient documentation

## 2014-09-22 DIAGNOSIS — Z79899 Other long term (current) drug therapy: Secondary | ICD-10-CM | POA: Diagnosis not present

## 2014-09-22 DIAGNOSIS — C773 Secondary and unspecified malignant neoplasm of axilla and upper limb lymph nodes: Secondary | ICD-10-CM | POA: Diagnosis not present

## 2014-09-22 DIAGNOSIS — C50911 Malignant neoplasm of unspecified site of right female breast: Secondary | ICD-10-CM | POA: Diagnosis not present

## 2014-09-22 DIAGNOSIS — Z87891 Personal history of nicotine dependence: Secondary | ICD-10-CM | POA: Diagnosis not present

## 2014-09-22 DIAGNOSIS — N6012 Diffuse cystic mastopathy of left breast: Secondary | ICD-10-CM | POA: Diagnosis not present

## 2014-09-22 DIAGNOSIS — N6082 Other benign mammary dysplasias of left breast: Secondary | ICD-10-CM | POA: Diagnosis not present

## 2014-09-22 DIAGNOSIS — Z794 Long term (current) use of insulin: Secondary | ICD-10-CM | POA: Insufficient documentation

## 2014-09-22 DIAGNOSIS — I10 Essential (primary) hypertension: Secondary | ICD-10-CM | POA: Insufficient documentation

## 2014-09-22 DIAGNOSIS — G8918 Other acute postprocedural pain: Secondary | ICD-10-CM | POA: Diagnosis not present

## 2014-09-22 DIAGNOSIS — Z853 Personal history of malignant neoplasm of breast: Secondary | ICD-10-CM | POA: Diagnosis not present

## 2014-09-22 DIAGNOSIS — E119 Type 2 diabetes mellitus without complications: Secondary | ICD-10-CM | POA: Diagnosis not present

## 2014-09-22 DIAGNOSIS — E785 Hyperlipidemia, unspecified: Secondary | ICD-10-CM | POA: Diagnosis not present

## 2014-09-22 DIAGNOSIS — C50919 Malignant neoplasm of unspecified site of unspecified female breast: Secondary | ICD-10-CM | POA: Diagnosis present

## 2014-09-22 HISTORY — DX: Hyperlipidemia, unspecified: E78.5

## 2014-09-22 HISTORY — DX: Headache, unspecified: R51.9

## 2014-09-22 HISTORY — DX: Other cervical disc degeneration, unspecified cervical region: M50.30

## 2014-09-22 HISTORY — DX: Headache: R51

## 2014-09-22 HISTORY — DX: Other chronic pain: G89.29

## 2014-09-22 HISTORY — PX: MASTECTOMY MODIFIED RADICAL: SUR848

## 2014-09-22 HISTORY — DX: Type 2 diabetes mellitus without complications: E11.9

## 2014-09-22 HISTORY — DX: Low back pain, unspecified: M54.50

## 2014-09-22 HISTORY — PX: SIMPLE MASTECTOMY WITH AXILLARY SENTINEL NODE BIOPSY: SHX6098

## 2014-09-22 HISTORY — DX: Low back pain: M54.5

## 2014-09-22 HISTORY — DX: Other intervertebral disc degeneration, thoracolumbar region: M51.35

## 2014-09-22 HISTORY — DX: Malignant neoplasm of unspecified site of right female breast: C50.911

## 2014-09-22 HISTORY — DX: Other intervertebral disc degeneration, lumbosacral region: M51.37

## 2014-09-22 HISTORY — PX: MASTECTOMY COMPLETE / SIMPLE: SUR845

## 2014-09-22 HISTORY — PX: MASTECTOMY MODIFIED RADICAL: SHX5962

## 2014-09-22 HISTORY — DX: Other intervertebral disc degeneration, lumbosacral region without mention of lumbar back pain or lower extremity pain: M51.379

## 2014-09-22 HISTORY — DX: Type 2 diabetes mellitus with diabetic polyneuropathy: E11.42

## 2014-09-22 HISTORY — DX: Nonrheumatic mitral (valve) insufficiency: I34.0

## 2014-09-22 LAB — GLUCOSE, CAPILLARY
GLUCOSE-CAPILLARY: 162 mg/dL — AB (ref 65–99)
GLUCOSE-CAPILLARY: 279 mg/dL — AB (ref 65–99)
Glucose-Capillary: 203 mg/dL — ABNORMAL HIGH (ref 65–99)
Glucose-Capillary: 238 mg/dL — ABNORMAL HIGH (ref 65–99)

## 2014-09-22 SURGERY — MASTECTOMY, MODIFIED RADICAL
Anesthesia: General | Site: Breast | Laterality: Right

## 2014-09-22 MED ORDER — EPHEDRINE SULFATE 50 MG/ML IJ SOLN
INTRAMUSCULAR | Status: AC
  Filled 2014-09-22: qty 1

## 2014-09-22 MED ORDER — MEPERIDINE HCL 25 MG/ML IJ SOLN: 6.2500 mg | INTRAMUSCULAR | Status: DC | PRN

## 2014-09-22 MED ORDER — FENTANYL CITRATE (PF) 250 MCG/5ML IJ SOLN
INTRAMUSCULAR | Status: AC
  Filled 2014-09-22: qty 5

## 2014-09-22 MED ORDER — MIDAZOLAM HCL 2 MG/2ML IJ SOLN
INTRAMUSCULAR | Status: AC
  Filled 2014-09-22: qty 2

## 2014-09-22 MED ORDER — SUCCINYLCHOLINE CHLORIDE 20 MG/ML IJ SOLN
INTRAMUSCULAR | Status: DC | PRN
  Administered 2014-09-22: 100 mg via INTRAVENOUS

## 2014-09-22 MED ORDER — INSULIN ASPART 100 UNIT/ML ~~LOC~~ SOLN
0.0000 [IU] | Freq: Three times a day (TID) | SUBCUTANEOUS | Status: DC
Start: 2014-09-22 — End: 2014-09-23
  Administered 2014-09-22: 7 [IU] via SUBCUTANEOUS
  Administered 2014-09-23: 11 [IU] via SUBCUTANEOUS

## 2014-09-22 MED ORDER — FLUTICASONE PROPIONATE 50 MCG/ACT NA SUSP: 1.0000 | Freq: Every day | NASAL | Status: DC | PRN

## 2014-09-22 MED ORDER — PROMETHAZINE HCL 25 MG/ML IJ SOLN
6.2500 mg | Freq: Once | INTRAMUSCULAR | Status: AC
  Administered 2014-09-22: 6.25 mg via INTRAVENOUS

## 2014-09-22 MED ORDER — LACTATED RINGERS IV SOLN
INTRAVENOUS | Status: DC | PRN
  Administered 2014-09-22 (×2): via INTRAVENOUS

## 2014-09-22 MED ORDER — PROPOFOL 10 MG/ML IV BOLUS
INTRAVENOUS | Status: DC | PRN
  Administered 2014-09-22: 50 mg via INTRAVENOUS
  Administered 2014-09-22: 150 mg via INTRAVENOUS

## 2014-09-22 MED ORDER — HYDROMORPHONE HCL 1 MG/ML IJ SOLN
0.2500 mg | INTRAMUSCULAR | Status: DC | PRN
  Administered 2014-09-22: 0.5 mg via INTRAVENOUS
  Administered 2014-09-22 (×2): 0.25 mg via INTRAVENOUS

## 2014-09-22 MED ORDER — PHENYLEPHRINE 40 MCG/ML (10ML) SYRINGE FOR IV PUSH (FOR BLOOD PRESSURE SUPPORT)
PREFILLED_SYRINGE | INTRAVENOUS | Status: AC
  Filled 2014-09-22: qty 10

## 2014-09-22 MED ORDER — SUCCINYLCHOLINE CHLORIDE 20 MG/ML IJ SOLN
INTRAMUSCULAR | Status: AC
  Filled 2014-09-22: qty 1

## 2014-09-22 MED ORDER — LIDOCAINE-EPINEPHRINE (PF) 1.5 %-1:200000 IJ SOLN
INTRAMUSCULAR | Status: DC | PRN
  Administered 2014-09-22: 15 mL

## 2014-09-22 MED ORDER — PHENYLEPHRINE HCL 10 MG/ML IJ SOLN
INTRAMUSCULAR | Status: DC | PRN
  Administered 2014-09-22: 40 ug via INTRAVENOUS

## 2014-09-22 MED ORDER — PROPOFOL 10 MG/ML IV BOLUS
INTRAVENOUS | Status: AC
  Filled 2014-09-22: qty 20

## 2014-09-22 MED ORDER — INSULIN ASPART 100 UNIT/ML ~~LOC~~ SOLN
6.0000 [IU] | Freq: Three times a day (TID) | SUBCUTANEOUS | Status: DC
  Administered 2014-09-22 – 2014-09-23 (×2): 6 [IU] via SUBCUTANEOUS

## 2014-09-22 MED ORDER — ONDANSETRON HCL 4 MG PO TABS: 4.0000 mg | ORAL_TABLET | Freq: Four times a day (QID) | ORAL | Status: DC | PRN

## 2014-09-22 MED ORDER — ONDANSETRON HCL 4 MG/2ML IJ SOLN
INTRAMUSCULAR | Status: DC | PRN
  Administered 2014-09-22: 4 mg via INTRAVENOUS

## 2014-09-22 MED ORDER — ONDANSETRON HCL 4 MG/2ML IJ SOLN: 4.0000 mg | Freq: Four times a day (QID) | INTRAMUSCULAR | Status: DC | PRN

## 2014-09-22 MED ORDER — MORPHINE SULFATE 2 MG/ML IJ SOLN: 1.0000 mg | INTRAMUSCULAR | Status: DC | PRN

## 2014-09-22 MED ORDER — ANASTROZOLE 1 MG PO TABS
1.0000 mg | ORAL_TABLET | Freq: Every day | ORAL | Status: DC
  Administered 2014-09-22 – 2014-09-23 (×2): 1 mg via ORAL
  Filled 2014-09-22 (×2): qty 1

## 2014-09-22 MED ORDER — GABAPENTIN 300 MG PO CAPS
300.0000 mg | ORAL_CAPSULE | Freq: Two times a day (BID) | ORAL | Status: DC
  Administered 2014-09-22 – 2014-09-23 (×2): 300 mg via ORAL
  Filled 2014-09-22 (×2): qty 1

## 2014-09-22 MED ORDER — ENOXAPARIN SODIUM 40 MG/0.4ML ~~LOC~~ SOLN
40.0000 mg | SUBCUTANEOUS | Status: DC
  Administered 2014-09-23: 40 mg via SUBCUTANEOUS
  Filled 2014-09-22: qty 0.4

## 2014-09-22 MED ORDER — PROMETHAZINE HCL 25 MG/ML IJ SOLN
INTRAMUSCULAR | Status: AC
  Administered 2014-09-22: 6.25 mg via INTRAVENOUS
  Filled 2014-09-22: qty 1

## 2014-09-22 MED ORDER — HYDROMORPHONE HCL 1 MG/ML IJ SOLN
INTRAMUSCULAR | Status: AC
  Administered 2014-09-22: 0.5 mg via INTRAVENOUS
  Filled 2014-09-22: qty 2

## 2014-09-22 MED ORDER — POTASSIUM CHLORIDE IN NACL 20-0.9 MEQ/L-% IV SOLN
INTRAVENOUS | Status: DC
  Administered 2014-09-22: 15:00:00 via INTRAVENOUS
  Filled 2014-09-22 (×3): qty 1000

## 2014-09-22 MED ORDER — ONDANSETRON HCL 4 MG/2ML IJ SOLN
4.0000 mg | Freq: Once | INTRAMUSCULAR | Status: AC | PRN
  Administered 2014-09-22: 4 mg via INTRAVENOUS

## 2014-09-22 MED ORDER — FUROSEMIDE 40 MG PO TABS
40.0000 mg | ORAL_TABLET | Freq: Every day | ORAL | Status: DC
  Filled 2014-09-22: qty 1

## 2014-09-22 MED ORDER — LIDOCAINE HCL (CARDIAC) 20 MG/ML IV SOLN
INTRAVENOUS | Status: AC
  Filled 2014-09-22: qty 5

## 2014-09-22 MED ORDER — ROCURONIUM BROMIDE 50 MG/5ML IV SOLN
INTRAVENOUS | Status: AC
  Filled 2014-09-22: qty 1

## 2014-09-22 MED ORDER — ONDANSETRON HCL 4 MG/2ML IJ SOLN
INTRAMUSCULAR | Status: AC
  Filled 2014-09-22: qty 2

## 2014-09-22 MED ORDER — MIDAZOLAM HCL 5 MG/5ML IJ SOLN
INTRAMUSCULAR | Status: DC | PRN
  Administered 2014-09-22: 2 mg via INTRAVENOUS

## 2014-09-22 MED ORDER — BUPIVACAINE-EPINEPHRINE 0.5% -1:200000 IJ SOLN
INTRAMUSCULAR | Status: DC | PRN
  Administered 2014-09-22: 15 mL

## 2014-09-22 MED ORDER — BUPIVACAINE-EPINEPHRINE (PF) 0.5% -1:200000 IJ SOLN
INTRAMUSCULAR | Status: DC | PRN
  Administered 2014-09-22: 15 mL

## 2014-09-22 MED ORDER — HYDROCODONE-ACETAMINOPHEN 5-325 MG PO TABS
1.0000 | ORAL_TABLET | ORAL | Status: DC | PRN
  Administered 2014-09-22 – 2014-09-23 (×2): 1 via ORAL
  Filled 2014-09-22 (×2): qty 1

## 2014-09-22 MED ORDER — ROCURONIUM BROMIDE 100 MG/10ML IV SOLN
INTRAVENOUS | Status: DC | PRN
  Administered 2014-09-22: 30 mg via INTRAVENOUS

## 2014-09-22 MED ORDER — SODIUM CHLORIDE 0.9 % IJ SOLN
INTRAMUSCULAR | Status: AC
  Filled 2014-09-22: qty 10

## 2014-09-22 MED ORDER — LIDOCAINE HCL (CARDIAC) 20 MG/ML IV SOLN
INTRAVENOUS | Status: DC | PRN
  Administered 2014-09-22: 100 mg via INTRAVENOUS

## 2014-09-22 MED ORDER — 0.9 % SODIUM CHLORIDE (POUR BTL) OPTIME
TOPICAL | Status: DC | PRN
  Administered 2014-09-22: 1000 mL
  Administered 2014-09-22: 2000 mL
  Administered 2014-09-22: 1000 mL

## 2014-09-22 MED ORDER — NEOSTIGMINE METHYLSULFATE 10 MG/10ML IV SOLN
INTRAVENOUS | Status: DC | PRN
  Administered 2014-09-22: 3 mg via INTRAVENOUS

## 2014-09-22 MED ORDER — INSULIN ASPART 100 UNIT/ML ~~LOC~~ SOLN
0.0000 [IU] | Freq: Every day | SUBCUTANEOUS | Status: DC
Start: 2014-09-22 — End: 2014-09-23
  Administered 2014-09-22: 3 [IU] via SUBCUTANEOUS

## 2014-09-22 MED ORDER — LOSARTAN POTASSIUM 50 MG PO TABS
50.0000 mg | ORAL_TABLET | Freq: Every day | ORAL | Status: DC
  Administered 2014-09-23: 50 mg via ORAL
  Filled 2014-09-22: qty 1

## 2014-09-22 MED ORDER — GLYCOPYRROLATE 0.2 MG/ML IJ SOLN
INTRAMUSCULAR | Status: DC | PRN
  Administered 2014-09-22: 0.4 mg via INTRAVENOUS

## 2014-09-22 MED ORDER — FENTANYL CITRATE (PF) 100 MCG/2ML IJ SOLN
INTRAMUSCULAR | Status: DC | PRN
  Administered 2014-09-22 (×5): 50 ug via INTRAVENOUS

## 2014-09-22 SURGICAL SUPPLY — 52 items
APPLIER CLIP 9.375 MED OPEN (MISCELLANEOUS) ×4
APR CLP MED 9.3 20 MLT OPN (MISCELLANEOUS) ×2
BINDER BREAST LRG (GAUZE/BANDAGES/DRESSINGS) IMPLANT
BINDER BREAST XLRG (GAUZE/BANDAGES/DRESSINGS) ×2 IMPLANT
CANISTER SUCTION 2500CC (MISCELLANEOUS) ×4 IMPLANT
CHLORAPREP W/TINT 26ML (MISCELLANEOUS) ×6 IMPLANT
CLIP APPLIE 9.375 MED OPEN (MISCELLANEOUS) ×2 IMPLANT
COVER SURGICAL LIGHT HANDLE (MISCELLANEOUS) ×4 IMPLANT
DRAIN CHANNEL 19F RND (DRAIN) ×10 IMPLANT
DRAPE LAPAROSCOPIC ABDOMINAL (DRAPES) ×4 IMPLANT
DRAPE UTILITY XL STRL (DRAPES) ×4 IMPLANT
ELECT CAUTERY BLADE 6.4 (BLADE) ×4 IMPLANT
ELECT REM PT RETURN 9FT ADLT (ELECTROSURGICAL) ×4
ELECTRODE REM PT RTRN 9FT ADLT (ELECTROSURGICAL) ×2 IMPLANT
EVACUATOR SILICONE 100CC (DRAIN) ×10 IMPLANT
GAUZE SPONGE 4X4 12PLY STRL (GAUZE/BANDAGES/DRESSINGS) ×4 IMPLANT
GLOVE BIO SURGEON STRL SZ7.5 (GLOVE) ×2 IMPLANT
GLOVE BIOGEL PI IND STRL 6.5 (GLOVE) IMPLANT
GLOVE BIOGEL PI IND STRL 7.0 (GLOVE) IMPLANT
GLOVE BIOGEL PI IND STRL 7.5 (GLOVE) IMPLANT
GLOVE BIOGEL PI INDICATOR 6.5 (GLOVE) ×2
GLOVE BIOGEL PI INDICATOR 7.0 (GLOVE) ×2
GLOVE BIOGEL PI INDICATOR 7.5 (GLOVE) ×2
GLOVE SURG SIGNA 7.5 PF LTX (GLOVE) ×6 IMPLANT
GLOVE SURG SS PI 7.0 STRL IVOR (GLOVE) ×2 IMPLANT
GLOVE SURG SS PI 7.5 STRL IVOR (GLOVE) ×2 IMPLANT
GOWN STRL REUS W/ TWL LRG LVL3 (GOWN DISPOSABLE) ×4 IMPLANT
GOWN STRL REUS W/ TWL XL LVL3 (GOWN DISPOSABLE) ×2 IMPLANT
GOWN STRL REUS W/TWL LRG LVL3 (GOWN DISPOSABLE) ×8
GOWN STRL REUS W/TWL XL LVL3 (GOWN DISPOSABLE) ×4
KIT BASIN OR (CUSTOM PROCEDURE TRAY) ×4 IMPLANT
KIT ROOM TURNOVER OR (KITS) ×4 IMPLANT
LIQUID BAND (GAUZE/BANDAGES/DRESSINGS) ×4 IMPLANT
NS IRRIG 1000ML POUR BTL (IV SOLUTION) ×4 IMPLANT
PACK GENERAL/GYN (CUSTOM PROCEDURE TRAY) ×4 IMPLANT
PAD ABD 8X10 STRL (GAUZE/BANDAGES/DRESSINGS) ×2 IMPLANT
PAD ARMBOARD 7.5X6 YLW CONV (MISCELLANEOUS) ×6 IMPLANT
SPECIMEN JAR X LARGE (MISCELLANEOUS) ×6 IMPLANT
SPONGE GAUZE 4X4 12PLY STER LF (GAUZE/BANDAGES/DRESSINGS) ×2 IMPLANT
SPONGE LAP 18X18 X RAY DECT (DISPOSABLE) ×4 IMPLANT
STAPLER VISISTAT 35W (STAPLE) ×2 IMPLANT
SUT ETHILON 3 0 FSL (SUTURE) ×8 IMPLANT
SUT MNCRL AB 4-0 PS2 18 (SUTURE) ×4 IMPLANT
SUT MON AB 4-0 PC3 18 (SUTURE) ×4 IMPLANT
SUT SILK 2 0 SH (SUTURE) IMPLANT
SUT VIC AB 3-0 SH 18 (SUTURE) ×8 IMPLANT
SUT VIC AB 3-0 SH 27 (SUTURE) ×4
SUT VIC AB 3-0 SH 27XBRD (SUTURE) ×2 IMPLANT
TAPE CLOTH SURG 6X10 WHT LF (GAUZE/BANDAGES/DRESSINGS) ×2 IMPLANT
TOWEL OR 17X24 6PK STRL BLUE (TOWEL DISPOSABLE) ×4 IMPLANT
TOWEL OR 17X26 10 PK STRL BLUE (TOWEL DISPOSABLE) ×4 IMPLANT
WATER STERILE IRR 1000ML POUR (IV SOLUTION) IMPLANT

## 2014-09-22 NOTE — Anesthesia Procedure Notes (Addendum)
Anesthesia Regional Block:  Pectoralis block  Pre-Anesthetic Checklist: ,, timeout performed, Correct Patient, Correct Site, Correct Laterality, Correct Procedure, Correct Position, site marked, Risks and benefits discussed,  Surgical consent,  Pre-op evaluation,  At surgeon's request and post-op pain management  Laterality: Left and Right  Prep: chloraprep       Needles:  Injection technique: Single-shot     Needle Length: 9cm 9 cm Needle Gauge: 21 and 21 G    Additional Needles:  Procedures: ultrasound guided (picture in chart) Pectoralis block Narrative:  Start time: 09/22/2014 7:55 AM End time: 09/22/2014 8:15 AM Injection made incrementally with aspirations every 5 mL.  Performed by: Personally  Anesthesiologist: Lillia Abed  Additional Notes: Monitors applied. Patient sedated. Sterile prep and drape,hand hygiene and sterile gloves were used.Right side done first followed by Left side. Relevant anatomy identified.Needle position confirmed.Local anesthetic injected incrementally after negative aspiration. Local anesthetic spread visualized. Vascular puncture avoided. No complications. Images printed for medical record.The patient tolerated the procedures well.       Procedure Name: Intubation Date/Time: 09/22/2014 8:33 AM Performed by: Julian Reil Pre-anesthesia Checklist: Patient identified, Emergency Drugs available, Suction available and Patient being monitored Patient Re-evaluated:Patient Re-evaluated prior to inductionOxygen Delivery Method: Circle system utilized Preoxygenation: Pre-oxygenation with 100% oxygen Intubation Type: IV induction Ventilation: Mask ventilation without difficulty Laryngoscope Size: Mac and 3 Grade View: Grade I Tube type: Oral Tube size: 7.0 mm Number of attempts: 1 Airway Equipment and Method: Stylet Placement Confirmation: ETT inserted through vocal cords under direct vision,  positive ETCO2 and breath sounds checked- equal  and bilateral Secured at: 22 cm Tube secured with: Tape Dental Injury: Teeth and Oropharynx as per pre-operative assessment

## 2014-09-22 NOTE — Anesthesia Preprocedure Evaluation (Signed)
Anesthesia Evaluation  Patient identified by MRN, date of birth, ID band Patient awake    Reviewed: Allergy & Precautions, NPO status , Patient's Chart, lab work & pertinent test results  Airway Mallampati: II  TM Distance: >3 FB Neck ROM: Full    Dental   Pulmonary former smoker,    Pulmonary exam normal       Cardiovascular hypertension, Pt. on medications Normal cardiovascular exam    Neuro/Psych    GI/Hepatic   Endo/Other  diabetes, Type 2, Insulin Dependent  Renal/GU      Musculoskeletal   Abdominal   Peds  Hematology   Anesthesia Other Findings   Reproductive/Obstetrics                             Anesthesia Physical Anesthesia Plan  ASA: III  Anesthesia Plan: General   Post-op Pain Management:    Induction: Intravenous  Airway Management Planned: Oral ETT  Additional Equipment:   Intra-op Plan:   Post-operative Plan: Extubation in OR  Informed Consent: I have reviewed the patients History and Physical, chart, labs and discussed the procedure including the risks, benefits and alternatives for the proposed anesthesia with the patient or authorized representative who has indicated his/her understanding and acceptance.     Plan Discussed with: CRNA and Surgeon  Anesthesia Plan Comments:         Anesthesia Quick Evaluation

## 2014-09-22 NOTE — Anesthesia Postprocedure Evaluation (Signed)
  Anesthesia Post-op Note  Patient: Tonya Alvarez  Procedure(s) Performed: Procedure(s):  RIGHT MASTECTOMY MODIFIED RADICAL (Right) LEFT SIMPLE MASTECTOMY (Left)  Patient Location: PACU  Anesthesia Type:GA combined with regional for post-op pain  Level of Consciousness: awake, alert , oriented and patient cooperative  Airway and Oxygen Therapy: Patient Spontanous Breathing and Patient connected to nasal cannula oxygen  Post-op Pain: 1 /10, mild  Post-op Assessment: Post-op Vital signs reviewed, Patient's Cardiovascular Status Stable, Respiratory Function Stable, Patent Airway and Pain level controlled, nausea improving  Post-op Vital Signs: Reviewed and stable  Last Vitals:  Filed Vitals:   09/22/14 1319  BP: 149/75  Pulse: 77  Temp: 36.4 C  Resp: 21    Complications: No apparent anesthesia complications

## 2014-09-22 NOTE — Interval H&P Note (Signed)
History and Physical Interval Note:no change in H and P  09/22/2014 8:02 AM  Tonya Alvarez  has presented today for surgery, with the diagnosis of Right Breast Cancer  The various methods of treatment have been discussed with the patient and family. After consideration of risks, benefits and other options for treatment, the patient has consented to  Procedure(s):  RIGHT MASTECTOMY MODIFIED RADICAL (Right) LEFT SIMPLE MASTECTOMY (Left) as a surgical intervention .  The patient's history has been reviewed, patient examined, no change in status, stable for surgery.  I have reviewed the patient's chart and labs.  Questions were answered to the patient's satisfaction.     Ernestine Langworthy A

## 2014-09-22 NOTE — Op Note (Signed)
RIGHT MASTECTOMY MODIFIED RADICAL, LEFT SIMPLE MASTECTOMY  Procedure Note  Tonya Alvarez 09/22/2014   Pre-op Diagnosis: RIGHT BREAST CANCER     Post-op Diagnosis: same  Procedure(s):  RIGHT MASTECTOMY MODIFIED RADICAL LEFT SIMPLE MASTECTOMY  Surgeon(s): Coralie Keens, MD  Anesthesia: General  Staff:  Circulator: Milas Kocher, RN; Cyd Silence, RN Scrub Person: Jenelle Mages Panchit RN First Assistant: Quincy Carnes, RN  Estimated Blood Loss: Minimal               Specimens: sent to path          [facility] A   Date: 09/22/2014  Time: 10:33 AM

## 2014-09-22 NOTE — Transfer of Care (Signed)
Immediate Anesthesia Transfer of Care Note  Patient: Tonya Alvarez  Procedure(s) Performed: Procedure(s):  RIGHT MASTECTOMY MODIFIED RADICAL (Right) LEFT SIMPLE MASTECTOMY (Left)  Patient Location: PACU  Anesthesia Type:GA combined with regional for post-op pain  Level of Consciousness: sedated and responds to stimulation  Airway & Oxygen Therapy: Patient Spontanous Breathing and Patient connected to face mask oxygen  Post-op Assessment: Report given to RN, Post -op Vital signs reviewed and stable and Patient moving all extremities  Post vital signs: Reviewed and stable  Last Vitals:  Filed Vitals:   09/22/14 0712  BP: 142/73  Pulse: 71  Temp: 37 C  Resp: 18    Complications: No apparent anesthesia complications

## 2014-09-23 ENCOUNTER — Encounter (HOSPITAL_COMMUNITY): Payer: Self-pay | Admitting: Surgery

## 2014-09-23 DIAGNOSIS — I10 Essential (primary) hypertension: Secondary | ICD-10-CM | POA: Diagnosis not present

## 2014-09-23 DIAGNOSIS — C773 Secondary and unspecified malignant neoplasm of axilla and upper limb lymph nodes: Secondary | ICD-10-CM | POA: Diagnosis not present

## 2014-09-23 DIAGNOSIS — Z87891 Personal history of nicotine dependence: Secondary | ICD-10-CM | POA: Diagnosis not present

## 2014-09-23 DIAGNOSIS — C50911 Malignant neoplasm of unspecified site of right female breast: Secondary | ICD-10-CM | POA: Diagnosis not present

## 2014-09-23 DIAGNOSIS — E785 Hyperlipidemia, unspecified: Secondary | ICD-10-CM | POA: Diagnosis not present

## 2014-09-23 DIAGNOSIS — Z9851 Tubal ligation status: Secondary | ICD-10-CM | POA: Diagnosis not present

## 2014-09-23 DIAGNOSIS — Z794 Long term (current) use of insulin: Secondary | ICD-10-CM | POA: Diagnosis not present

## 2014-09-23 DIAGNOSIS — E119 Type 2 diabetes mellitus without complications: Secondary | ICD-10-CM | POA: Diagnosis not present

## 2014-09-23 DIAGNOSIS — C50912 Malignant neoplasm of unspecified site of left female breast: Secondary | ICD-10-CM | POA: Diagnosis not present

## 2014-09-23 DIAGNOSIS — Z79899 Other long term (current) drug therapy: Secondary | ICD-10-CM | POA: Diagnosis not present

## 2014-09-23 LAB — CBC
HEMATOCRIT: 34.4 % — AB (ref 36.0–46.0)
HEMOGLOBIN: 11.1 g/dL — AB (ref 12.0–15.0)
MCH: 28.4 pg (ref 26.0–34.0)
MCHC: 32.3 g/dL (ref 30.0–36.0)
MCV: 88 fL (ref 78.0–100.0)
Platelets: 242 10*3/uL (ref 150–400)
RBC: 3.91 MIL/uL (ref 3.87–5.11)
RDW: 15.9 % — ABNORMAL HIGH (ref 11.5–15.5)
WBC: 8.1 10*3/uL (ref 4.0–10.5)

## 2014-09-23 LAB — BASIC METABOLIC PANEL
ANION GAP: 8 (ref 5–15)
BUN: 14 mg/dL (ref 6–20)
CHLORIDE: 101 mmol/L (ref 101–111)
CO2: 28 mmol/L (ref 22–32)
Calcium: 8.9 mg/dL (ref 8.9–10.3)
Creatinine, Ser: 0.85 mg/dL (ref 0.44–1.00)
GFR calc Af Amer: >60 mL/min (ref >60)
GFR calc non Af Amer: >60 mL/min (ref >60)
Glucose, Bld: 268 mg/dL — ABNORMAL HIGH (ref 65–99)
POTASSIUM: 4.3 mmol/L (ref 3.5–5.1)
Sodium: 137 mmol/L (ref 135–145)

## 2014-09-23 LAB — GLUCOSE, CAPILLARY: GLUCOSE-CAPILLARY: 262 mg/dL — AB (ref 65–99)

## 2014-09-23 MED ORDER — OXYCODONE HCL 5 MG PO TABS: 5.0000 mg | ORAL_TABLET | Freq: Four times a day (QID) | ORAL | Status: DC | PRN

## 2014-09-23 NOTE — Discharge Instructions (Signed)
[facility] [place] °[phone] ° °MASTECTOMY: POST OP INSTRUCTIONS ° °Always review your discharge instruction sheet given to you by the facility where your surgery was performed. °IF YOU HAVE DISABILITY OR FAMILY LEAVE FORMS, YOU MUST BRING THEM TO THE OFFICE FOR PROCESSING.   °DO NOT GIVE THEM TO YOUR DOCTOR. °A prescription for pain medication may be given to you upon discharge.  Take your pain medication as prescribed, if needed.  If narcotic pain medicine is not needed, then you may take acetaminophen (Tylenol) or ibuprofen (Advil) as needed. °1. Take your usually prescribed medications unless otherwise directed. °2. If you need a refill on your pain medication, please contact your pharmacy.  They will contact our office to request authorization.  Prescriptions will not be filled after 5pm or on week-ends. °3. You should follow a light diet the first few days after arrival home, such as soup and crackers, etc.  Resume your normal diet the day after surgery. °4. Most patients will experience some swelling and bruising on the chest and underarm.  Ice packs will help.  Swelling and bruising can take several days to resolve.  °5. It is common to experience some constipation if taking pain medication after surgery.  Increasing fluid intake and taking a stool softener (such as Colace) will usually help or prevent this problem from occurring.  A mild laxative (Milk of Magnesia or Miralax) should be taken according to package instructions if there are no bowel movements after 48 hours. °6. Unless discharge instructions indicate otherwise, leave your bandage dry and in place until your next appointment in 3-5 days.  You may take a limited sponge bath.  No tube baths or showers until the drains are removed.  You may have steri-strips (small skin tapes) in place directly over the incision.  These strips should be left on the skin for 7-10 days.  If your surgeon used skin glue on the incision, you may  shower in 24 hours.  The glue will flake off over the next 2-3 weeks.  Any sutures or staples will be removed at the office during your follow-up visit. °7. DRAINS:  If you have drains in place, it is important to keep a list of the amount of drainage produced each day in your drains.  Before leaving the hospital, you should be instructed on drain care.  Call our office if you have any questions about your drains. °8. ACTIVITIES:  You may resume regular (light) daily activities beginning the next day--such as daily self-care, walking, climbing stairs--gradually increasing activities as tolerated.  You may have sexual intercourse when it is comfortable.  Refrain from any heavy lifting or straining until approved by your doctor. °a. You may drive when you are no longer taking prescription pain medication, you can comfortably wear a seatbelt, and you can safely maneuver your car and apply brakes. °b. RETURN TO WORK:  __________________________________________________________ °9. You should see your doctor in the office for a follow-up appointment approximately 3-5 days after your surgery.  Your doctor’s nurse will typically make your follow-up appointment when she calls you with your pathology report.  Expect your pathology report 2-3 business days after your surgery.  You may call to check if you do not hear from us after three days.   °10. OTHER INSTRUCTIONS: ______________________________________________________________________________________________ ____________________________________________________________________________________________ °WHEN TO CALL YOUR DOCTOR: °1. Fever over 101.0 °2. Nausea and/or vomiting °3. Extreme swelling or bruising °4. Continued bleeding from incision. °5. Increased pain, redness, or drainage from the incision. °  The clinic staff is available to answer your questions during regular business hours.  Please don’t hesitate to call and ask to speak to one of the nurses for clinical  concerns.  If you have a medical emergency, go to the nearest emergency room or call 911.  A surgeon from [facility] is always on call at the hospital. °[address], [address], [place], [place]  [address] ? [address] [address], [place], [place]   [address] °[phone] ? [phone] ? FAX [phone] °Web site: [email or web address] °

## 2014-09-23 NOTE — Progress Notes (Signed)
Patient ID: Tonya Alvarez, female   DOB: 05/17/1945, 69 y.o.   MRN: [id number]  Doing well Discharge home

## 2014-09-23 NOTE — Op Note (Signed)
NAMEFLO, BERROA NO.:  [id number]  MEDICAL RECORD NO.:  [id number]  LOCATION:  6N20C                        FACILITY:  [facility]  PHYSICIAN:  Coralie Keens, M.D. DATE OF BIRTH:  [birth year]  DATE OF PROCEDURE:  09/22/2014 DATE OF DISCHARGE:                              OPERATIVE REPORT   PREOPERATIVE DIAGNOSIS:  Right breast cancer.  POSTOPERATIVE DIAGNOSIS:  Right breast cancer.  PROCEDURES: 1. Right modified radical mastectomy. 2. Left simple mastectomy.  SURGEON:  Coralie Keens, M.D.  ASSISTANT:  Ina Kick, RNFA.  ANESTHESIA:  General endotracheal anesthesia and nerve block by Anesthesia.  ESTIMATED BLOOD LOSS:  Minimal.  INDICATIONS:  This is a 69 year old female, who has undergone neoadjuvant therapy for stage IV right breast cancer.  She also has a suspicious lesion in the left breast.  She has had a long discussion with Oncology.  After talking with her oncologist and myself, she wished to proceed with a right modified radical mastectomy as well as a left simple mastectomy.  PROCEDURE IN DETAIL:  The patient was brought to the operating room, identified as Tonya Alvarez.  She was placed supine on the operating table and general anesthesia was induced.  She had already had nerve blocks placed by Anesthesia in the preoperative holding area.  I first decided to proceed with the simple mastectomy first on left side.  I made an elliptical incision on the chest with a scalpel incorporating the nipple-areolar complex.  I then took this down to the breast tissue with electrocautery.  I then created the superior skin flap first staying just underneath the skin and dermis with electrocautery going down to the chest wall.  I then dissected out the inferior flap going down to the inframammary ridge with the cautery.  I then removed the breast tissue going medial to lateral from over the pectoralis muscle working from the edge of the sternum  laterally.  Once I reached the most lateral extent, the breast was completely removed.  I placed a silk suture to mark the lateral side of the mastectomy.  This was then sent to Pathology for evaluation.  I placed a dry laparotomy pad into the wound and hemostasis appeared to be achieved.  Next, I made an elliptical incision on the patient's right chest going from medial to lateral corresponded to the left-sided incision.  I took this down to the breast tissue with electrocautery.  I then again dissected the superior skin flaps going up to just below where the Port-A-Cath was placed and then the inferior skin flaps down to the inframammary ridge with electrocautery.  I then worked toward the axilla taken the skin flaps down there as well.  I then dissected the breast off the chest wall with electrocautery moving medial to lateral.  I then got to the border of the pectoralis major and minor muscles and took this into the axilla.  I then performed a complete axillary dissection.  I identified the axillary vein stayed below this taking out the complete large fatty nodal package.  There were no hard firm lymph nodes identified in the axilla.  The thoracodorsal artery and nerve appeared to be spared. Several bridging  vessels were clipped with surgical clips as I completely removed the nodal package with both blunt dissection and the electrocautery.  Once the nodal package was completely removed, the entire modified radical mastectomy specimen was then removed completely and sent to Pathology for evaluation.  I then thoroughly irrigated both incisions with saline.  Hemostasis appeared to be achieved with cautery. I made 2 skin incisions on the right side wall and left and  placed two 19-French Blake drains on the right side and one on the left side in the mastectomy incisions and axilla.  I then sewed these in place with nylon sutures.  Both skin incisions were then closed with interrupted  3-0 Vicryl sutures and running 4-0 Monocryl sutures.  Skin glue was then applied.  Breast binder was then also applied.  The patient tolerated the procedure well.  All the counts were correct at the end of the procedure.  The patient was then extubated in operating room and taken in stable condition to recovery room.     Coralie Keens, M.D.     DB/MEDQ  D:  09/22/2014  T:  09/23/2014  Job:  [id number]

## 2014-09-23 NOTE — Discharge Summary (Signed)
Physician Discharge Summary  Patient ID: Tonya Alvarez MRN: [id number] DOB/AGE: Nov 06, 1945 69 y.o.  Admit date: 09/22/2014 Discharge date: 09/23/2014  Admission Diagnoses:  Discharge Diagnoses:  Active Problems:   Breast cancer   Discharged Condition: good  Hospital Course: uneventful post op recovery s/p bilat mastectomy  Consults: None  Significant Diagnostic Studies:   Treatments: surgery: right modified radical mastectomy, left simple mastectomy  Discharge Exam: Blood pressure 139/59, pulse 66, temperature 98 F (36.7 C), temperature source Oral, resp. rate 18, height 5' 9.5" (1.765 m), weight 141.069 kg (311 lb), SpO2 98 %. General appearance: alert, cooperative and no distress Resp: clear to auscultation bilaterally Cardio: regular rate and rhythm, S1, S2 normal, no murmur, click, rub or gallop Incision/Wound:chest incisions clean without hematoma, drains serosang  Disposition: 01-Home or Self Care     Medication List    TAKE these medications        acetaminophen 500 MG tablet  Commonly known as:  TYLENOL  Take 500 mg by mouth every 6 (six) hours as needed.     anastrozole 1 MG tablet  Commonly known as:  ARIMIDEX  Take 1 tablet (1 mg total) by mouth daily.     aspirin 81 MG tablet  Take 81 mg by mouth at bedtime.     calcium-vitamin D 500-200 MG-UNIT per tablet  Commonly known as:  OSCAL WITH D  Take 1 tablet by mouth daily.     fluticasone 50 MCG/ACT nasal spray  Commonly known as:  FLONASE  Place 1 spray into both nostrils daily as needed for allergies or rhinitis.     furosemide 40 MG tablet  Commonly known as:  LASIX  TAKE ONE TABLET BY MOUTH ONCE DAILY     gabapentin 300 MG capsule  Commonly known as:  NEURONTIN  Take 300 mg by mouth 2 (two) times daily.     ibuprofen 600 MG tablet  Commonly known as:  ADVIL,MOTRIN  Take 600 mg by mouth every 12 (twelve) hours.     insulin lispro protamine-lispro (75-25) 100 UNIT/ML Susp injection   Commonly known as:  HUMALOG 75/25 MIX  Inject 60-68 Units into the skin 2 (two) times daily with a meal. Inject 60-68 units into the skin 2 times daily, and 10 ml in the middle of the day     lidocaine-prilocaine cream  Commonly known as:  EMLA  Apply 1 application topically as needed.     LORazepam 0.5 MG tablet  Commonly known as:  ATIVAN  Take 1 tablet (0.5 mg total) by mouth every 6 (six) hours as needed (Nausea or vomiting).     losartan 50 MG tablet  Commonly known as:  COZAAR  Take 50 mg by mouth every morning.     metFORMIN 500 MG tablet  Commonly known as:  GLUCOPHAGE  Take 1,000 mg by mouth 2 (two) times daily with a meal.     multivitamin with minerals Tabs tablet  Take 1 tablet by mouth at bedtime.     oxyCODONE 5 MG immediate release tablet  Commonly known as:  Oxy IR/ROXICODONE  Take 1-2 tablets (5-10 mg total) by mouth every 6 (six) hours as needed for moderate pain, severe pain or breakthrough pain.     prochlorperazine 10 MG tablet  Commonly known as:  COMPAZINE  Take 1 tablet (10 mg total) by mouth every 6 (six) hours as needed for nausea or vomiting.     simvastatin 10 MG tablet  Commonly known as:  ZOCOR  Take 20 mg by mouth every evening.     tiZANidine 2 MG tablet  Commonly known as:  ZANAFLEX  Take 2 mg by mouth at bedtime as needed for muscle spasms. Pt takes this medication as needed           Follow-up Information    Follow up with [facility], MD. Schedule an appointment as soon as possible for a visit on 10/01/2014.   Specialty:  General Surgery   Why:  For suture removal   Contact information:   [address] [address] [phone]       Signed: Harl Bowie 09/23/2014, 8:43 AM

## 2014-09-23 NOTE — Anesthesia Postprocedure Evaluation (Signed)
Anesthesia Post Note  Patient: Tonya Alvarez  Procedure(s) Performed: Procedure(s) (LRB):  RIGHT MASTECTOMY MODIFIED RADICAL (Right) LEFT SIMPLE MASTECTOMY (Left)  Anesthesia type: general  Patient location: PACU  Post pain: Pain level controlled  Post assessment: Patient's Cardiovascular Status Stable  Last Vitals:  Filed Vitals:   09/23/14 0533  BP: 139/59  Pulse: 66  Temp: 36.7 C  Resp: 18    Post vital signs: Reviewed and stable  Level of consciousness: sedated  Complications: No apparent anesthesia complications

## 2014-09-23 NOTE — Progress Notes (Signed)
Patient discharged home. Discharge instructions given. Medication regimen understood.

## 2014-09-28 ENCOUNTER — Telehealth: Payer: Self-pay | Admitting: Hematology and Oncology

## 2014-09-28 NOTE — Telephone Encounter (Signed)
Due to bmdc moved 6/1 f/u to 6/7. Per VG keep original appointment for 6/1 @ 11:30am as he will be done w/bmdc by then. Moved appointment back to 6/1 @ 11:30am. Spoke with patient she is aware. Patient asked to disregard any previous mychart messages. Schedule mailed.

## 2014-10-05 NOTE — Assessment & Plan Note (Signed)
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 Followed by 12 cycles of weekly Abraxane completed 07/19/2014  S/P Mastectomy 09/22/14: Bilateral mastectomies: Right: IDC Grade 2, 2.7cm, LVI, PNI, 6/7 LN Positive, Er 90%, PR 5% T2N2  Plan: 1. Adjuvant XRT 2. Foll by Anti-estrogen therapy  Her husband is going through head and neck cancer radiation simultaneously.

## 2014-10-06 ENCOUNTER — Ambulatory Visit: Payer: Medicare Other | Admitting: Hematology and Oncology

## 2014-10-06 ENCOUNTER — Ambulatory Visit (HOSPITAL_BASED_OUTPATIENT_CLINIC_OR_DEPARTMENT_OTHER): Payer: Medicare Other | Admitting: Hematology and Oncology

## 2014-10-06 ENCOUNTER — Other Ambulatory Visit: Payer: Self-pay | Admitting: *Deleted

## 2014-10-06 ENCOUNTER — Telehealth: Payer: Self-pay | Admitting: Hematology and Oncology

## 2014-10-06 VITALS — BP 127/63 | HR 87 | Temp 98.2°F | Resp 20 | Ht 69.5 in | Wt 305.8 lb

## 2014-10-06 DIAGNOSIS — C773 Secondary and unspecified malignant neoplasm of axilla and upper limb lymph nodes: Secondary | ICD-10-CM

## 2014-10-06 DIAGNOSIS — C50411 Malignant neoplasm of upper-outer quadrant of right female breast: Secondary | ICD-10-CM

## 2014-10-06 DIAGNOSIS — C7951 Secondary malignant neoplasm of bone: Secondary | ICD-10-CM

## 2014-10-06 DIAGNOSIS — Z170 Estrogen receptor positive status [ER+]: Secondary | ICD-10-CM | POA: Diagnosis not present

## 2014-10-06 NOTE — Progress Notes (Signed)
Patient Care Team: Jacelyn Pi, MD as PCP - General (Endocrinology)  DIAGNOSIS: Breast cancer of upper-outer quadrant of right female breast   Staging form: Breast, AJCC 7th Edition     Clinical: Stage IIIA (T3, N1, M1) - Signed by Rulon Eisenmenger, MD on 03/05/2014     Pathologic: T3, N1a, M1 - Unsigned   SUMMARY OF ONCOLOGIC HISTORY:   Breast cancer of upper-outer quadrant of right female breast   02/02/2014 Mammogram Right breast: 5.8 cm irregular high-density solid mass suggestive of malignancy; left breast next millimeter internal mammary lymph node   02/04/2014 Initial Biopsy  2 biopsies were performed the right breast and axilla: Grade 2 invasive ductal carcinoma ER/PR positive HER-2 negative Ki-67 20% to 47%: Biopsy of right humerus metastatic breast cancer estrogen positive   02/17/2014 PET scan Hypermetabolic right breast cancer and right axillary lymph nodes subpectoral lymph nodes indeterminate right middle lobe pulmonary nodule and hypermetabolic proximal right humerus lesion   03/05/2014 - 07/19/2014 Neo-Adjuvant Chemotherapy Dose dense Adriamycin and Cytoxan x4 cycles followed by Abraxane weekly x12   07/26/2014 Breast MRI Multifocal breast cancer decreased in size, retroareolar 13 mm now 11 mm, middle third 22 mm now 18 mm, 3.9 x 3.9 cm now 3.9 x 2.6 cm, multiple smaller nodules decreased in size, right x-ray lymph node 3.6 cm now 2 cm other lymph nodes are smaller   07/28/2014 -  Anti-estrogen oral therapy Anastrozole 1 mg daily   08/02/2014 PET scan Interval improvement in the right breast mass and axillary/subpectoral lymph nodes and right humerus head. Several subpleural nodules are not visible, 3.4. cm adrenal adenoma   09/22/2014 Surgery Bilateral mastectomy: Right: IDC Grade 2, 2.7cm, LVI, PNI, 6/7 LN Positive, Er 90%, PR 5% T2N2    CHIEF COMPLIANT:  Follow-up of right breast cancer after undergoing mastectomy  INTERVAL HISTORY: Tonya Alvarez is a  69 year old with  above-mentioned history of multifocal right breast cancer with enlarged axillary lymph nodes wonderment new adjuvant chemotherapy followed by bilateral mastectomies. She had a right axillary lymph node dissection. Mastectomy showed invasive ductal carcinoma 2.7 cm with lymphovascular invasion and perineural invasion.  6/8 lymph nodes positive for breast cancer. She currently has drains in both sides but appears to be recovering well.  REVIEW OF SYSTEMS:   Constitutional: Denies fevers, chills or abnormal weight loss Eyes: Denies blurriness of vision Ears, nose, mouth, throat, and face: Denies mucositis or sore throat Respiratory: Denies cough, dyspnea or wheezes Cardiovascular: Denies palpitation, chest discomfort or lower extremity swelling Gastrointestinal:  Denies nausea, heartburn or change in bowel habits Skin: Denies abnormal skin rashes Lymphatics: Denies new lymphadenopathy or easy bruising Neurological:Denies numbness, tingling or new weaknesses Behavioral/Psych: Mood is stable, no new changes  Breast:  Drains bilaterallys All other systems were reviewed with the patient and are negative.  I have reviewed the past medical history, past surgical history, social history and family history with the patient and they are unchanged from previous note.  ALLERGIES:  is allergic to amoxicillin; lisinopril; and oxycodone.  MEDICATIONS:  Current Outpatient Prescriptions  Medication Sig Dispense Refill  . acetaminophen (TYLENOL) 500 MG tablet Take 500 mg by mouth every 6 (six) hours as needed.    Marland Kitchen anastrozole (ARIMIDEX) 1 MG tablet Take 1 tablet (1 mg total) by mouth daily. 90 tablet 3  . aspirin 81 MG tablet Take 81 mg by mouth at bedtime.     . calcium-vitamin D (OSCAL WITH D) 500-200 MG-UNIT per tablet Take 1 tablet  by mouth daily.    . fluticasone (FLONASE) 50 MCG/ACT nasal spray Place 1 spray into both nostrils daily as needed for allergies or rhinitis. 16 g 0  . furosemide (LASIX) 40  MG tablet TAKE ONE TABLET BY MOUTH ONCE DAILY 30 tablet 0  . gabapentin (NEURONTIN) 300 MG capsule Take 300 mg by mouth 2 (two) times daily.    Marland Kitchen ibuprofen (ADVIL,MOTRIN) 600 MG tablet Take 600 mg by mouth every 12 (twelve) hours.    . insulin lispro protamine-lispro (HUMALOG 75/25 MIX) (75-25) 100 UNIT/ML SUSP injection Inject 60-68 Units into the skin 2 (two) times daily with a meal. Inject 60-68 units into the skin 2 times daily, and 10 ml in the middle of the day    . lidocaine-prilocaine (EMLA) cream Apply 1 application topically as needed. 30 g 6  . LORazepam (ATIVAN) 0.5 MG tablet Take 1 tablet (0.5 mg total) by mouth every 6 (six) hours as needed (Nausea or vomiting). 30 tablet 0  . losartan (COZAAR) 50 MG tablet Take 50 mg by mouth every morning.    . metFORMIN (GLUCOPHAGE) 500 MG tablet Take 1,000 mg by mouth 2 (two) times daily with a meal.     . Multiple Vitamin (MULTIVITAMIN WITH MINERALS) TABS tablet Take 1 tablet by mouth at bedtime.    Marland Kitchen oxyCODONE (OXY IR/ROXICODONE) 5 MG immediate release tablet Take 1-2 tablets (5-10 mg total) by mouth every 6 (six) hours as needed for moderate pain, severe pain or breakthrough pain. 30 tablet 0  . prochlorperazine (COMPAZINE) 10 MG tablet Take 1 tablet (10 mg total) by mouth every 6 (six) hours as needed for nausea or vomiting. 30 tablet 0  . simvastatin (ZOCOR) 10 MG tablet Take 20 mg by mouth every evening.     Marland Kitchen tiZANidine (ZANAFLEX) 2 MG tablet Take 2 mg by mouth at bedtime as needed for muscle spasms. Pt takes this medication as needed     No current facility-administered medications for this visit.    PHYSICAL EXAMINATION: ECOG PERFORMANCE STATUS: 1 - Symptomatic but completely ambulatory  Filed Vitals:   10/06/14 1133  BP: 127/63  Pulse: 87  Temp: 98.2 F (36.8 C)  Resp: 20   Filed Weights   10/06/14 1133  Weight: 305 lb 12.8 oz (138.71 kg)    GENERAL:alert, no distress and comfortable SKIN: skin color, texture, turgor are  normal, no rashes or significant lesions EYES: normal, Conjunctiva are pink and non-injected, sclera clear OROPHARYNX:no exudate, no erythema and lips, buccal mucosa, and tongue normal  NECK: supple, thyroid normal size, non-tender, without nodularity LYMPH:  no palpable lymphadenopathy in the cervical, axillary or inguinal LUNGS: clear to auscultation and percussion with normal breathing effort HEART: regular rate & rhythm and no murmurs and no lower extremity edema ABDOMEN:abdomen soft, non-tender and normal bowel sounds Musculoskeletal:no cyanosis of digits and no clubbing  NEURO: alert & oriented x 3 with fluent speech, no focal motor/sensory deficits  LABORATORY DATA:  I have reviewed the data as listed   Chemistry      Component Value Date/Time   NA 137 09/23/2014 0458   NA 142 07/19/2014 1005   K 4.3 09/23/2014 0458   K 4.1 07/19/2014 1005   CL 101 09/23/2014 0458   CO2 28 09/23/2014 0458   CO2 25 07/19/2014 1005   BUN 14 09/23/2014 0458   BUN 12.9 07/19/2014 1005   CREATININE 0.85 09/23/2014 0458   CREATININE 0.7 07/19/2014 1005      Component  Value Date/Time   CALCIUM 8.9 09/23/2014 0458   CALCIUM 9.3 07/19/2014 1005   ALKPHOS 78 07/19/2014 1005   ALKPHOS 74 08/01/2010 1746   AST 15 07/19/2014 1005   AST 26 08/01/2010 1746   ALT 21 07/19/2014 1005   ALT 20 08/01/2010 1746   BILITOT 0.31 07/19/2014 1005   BILITOT 0.4 08/01/2010 1746       Lab Results  Component Value Date   WBC 8.1 09/23/2014   HGB 11.1* 09/23/2014   HCT 34.4* 09/23/2014   MCV 88.0 09/23/2014   PLT 242 09/23/2014   NEUTROABS 3.7 07/19/2014   ASSESSMENT & PLAN:  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 Followed by 12 cycles of weekly Abraxane completed  07/19/2014  S/P Mastectomy 09/22/14: Bilateral mastectomies: Right: IDC Grade 2, 2.7cm, LVI, PNI, 6/8 LN Positive, Er 90%, PR 5% T2N2  I discussed with her that she has significant amount of residual breast cancer with 6 out of 8 lymph nodes positive. Patient understands this there was significant lymphovascular invasion and perineural invasion. I provided her with a copy of this report.   Plan: 1. Adjuvant XRT  To the chest wall and axilla as well as to the left humerus. 2. Foll by Anti-estrogen therapy  Her husband is going through head and neck cancer radiation simultaneously.  He got radiation with Dr. Isidore Moos as well.    Orders Placed This Encounter  Procedures  . Ambulatory referral to Radiation Oncology    Referral Priority:  Routine    Referral Type:  Consultation    Referral Reason:  Specialty Services Required    Referred to Provider:  Eppie Gibson, MD    Requested Specialty:  Radiation Oncology    Number of Visits Requested:  1   The patient has a good understanding of the overall plan. she agrees with it. she will call with any problems that may develop before the next visit here.   Rulon Eisenmenger, MD

## 2014-10-06 NOTE — Telephone Encounter (Signed)
Appointments made and avs printed for patient,rad onc will call the patient °

## 2014-10-08 ENCOUNTER — Ambulatory Visit: Payer: Medicare Other | Attending: Hematology and Oncology | Admitting: Physical Therapy

## 2014-10-08 DIAGNOSIS — Z9189 Other specified personal risk factors, not elsewhere classified: Secondary | ICD-10-CM | POA: Diagnosis not present

## 2014-10-08 DIAGNOSIS — M25612 Stiffness of left shoulder, not elsewhere classified: Secondary | ICD-10-CM | POA: Insufficient documentation

## 2014-10-08 DIAGNOSIS — M25611 Stiffness of right shoulder, not elsewhere classified: Secondary | ICD-10-CM | POA: Diagnosis not present

## 2014-10-08 DIAGNOSIS — R6889 Other general symptoms and signs: Secondary | ICD-10-CM | POA: Insufficient documentation

## 2014-10-08 NOTE — Therapy (Signed)
[place], [place], [address] Phone: [phone]   Fax:  [phone]  Physical Therapy Evaluation  Patient Details  Name: Tonya Alvarez MRN: [id number] Date of Birth: October 29, 1945 Referring Provider:  Nicholas Lose, MD  Encounter Date: 10/08/2014      PT End of Session - 10/08/14 1258    Visit Number 1   Number of Visits 6   Date for PT Re-Evaluation 11/05/14   PT Start Time 0937   PT Stop Time 1020   PT Time Calculation (min) 43 min   Activity Tolerance Patient tolerated treatment well   Behavior During Therapy [facility] for tasks assessed/performed      Past Medical History  Diagnosis Date  . Heart murmur     mitral valve  . Complication of anesthesia     O2 SAT  DROP   . Hypertension   . Neuromuscular disorder   . Type II diabetes mellitus   . Mitral regurgitation   . Hyperlipidemia   . Diabetic peripheral neuropathy   . Sinus headache     "seasonal"  . Arthritis     "knees, ankles" (09/22/2014  . DDD (degenerative disc disease), cervical   . DDD (degenerative disc disease), thoracolumbar   . DDD (degenerative disc disease), lumbosacral   . Chronic lower back pain   . Cancer of right breast     Past Surgical History  Procedure Laterality Date  . Portacath placement Right 02/23/2014    power port, tip cavo-atrial junction  . Mastectomy complete / simple Left 09/22/2014  . Mastectomy modified radical Right 09/22/2014  . Tonsillectomy  1955  . Total knee arthroplasty Left ~ 2009  . Joint replacement    . Breast biopsy Right 02/2014  . Tubal ligation  1980  . Mastectomy modified radical Right 09/22/2014    Procedure:  RIGHT MASTECTOMY MODIFIED RADICAL;  Surgeon: Coralie Keens, MD;  Location: [place];  Service: General;  Laterality: Right;  . Simple mastectomy with axillary sentinel node biopsy Left 09/22/2014    Procedure: LEFT SIMPLE MASTECTOMY;  Surgeon: Coralie Keens, MD;  Location:  [place];  Service: General;  Laterality: Left;    There were no vitals filed for this visit.  Visit Diagnosis:  Stiffness of joint, shoulder region, right - Plan: PT plan of care cert/re-cert  Stiffness of joint, shoulder region, left - Plan: PT plan of care cert/re-cert  Impaired function of upper extremity - Plan: PT plan of care cert/re-cert  At risk for lymphedema - Plan: PT plan of care cert/re-cert      Subjective Assessment - 10/08/14 0939    Subjective Is sore.  Last two drains just came out Tuesday.  Has nerve pain in right upper arm.   Pertinent History Bilateral mastectomy on 09/21/13; left was simple with no nodes removed, right was modified radical with 8 nodes removed, 6 positive.  Had neo-adjuvant chemo and will have radiation therapy.  Diabetes and on insulin; under pretty good control.  Left TKA about 7 years ago; DDD throughout spine; left elbow dislocation 4 years ago;  diabetic neuopathy with tingling in toes.  She thinks it affects her balance.  Partial rotator cuff tear on right about two years ago, but resolved with full function.  Patient Stated Goals learn about draining fluid ("I know radiation can cause lymphedema.")   Currently in Pain? Yes   Pain Score 5    Pain Location Arm   Pain Orientation Right;Upper;Posterior   Pain Descriptors / Indicators Burning   Aggravating Factors  touching it, moving it   Pain Relieving Factors prop arm up, put ice pack on it            [facility] PT Assessment - 10/08/14 0001    Assessment   Medical Diagnosis right breast cancer, bilateral mastectomy   Onset Date/Surgical Date 09/22/14   Precautions   Precautions Other (comment)  cancer precautions   Restrictions   Weight Bearing Restrictions No   Balance Screen   Has the patient fallen in the past 6 months No   Has the patient had a decrease in activity level because of a fear of falling?  No  is careful on  grass or uneven surfaces   Is the patient reluctant to leave their home because of a fear of falling?  No   Home Ecologist residence   Living Arrangements Spouse/significant other   Type of [place] One level   Prior Function   Level of [place] [facility] and plans to get back to that; walks some   Observation/Other Assessments   Observations both mastectomy incisions healing, but not fully healed; extra tissue and perhaps some fluid in the area   Skin Integrity right lateral drain site is not fully healed and has a white center; right lateral breast incision slightly pink today   ROM / Strength   AROM / PROM / Strength AROM   AROM   AROM Assessment Site Shoulder   Right/Left Shoulder Right;Left   Right Shoulder Flexion 105 Degrees   Right Shoulder ABduction 96 Degrees   Right Shoulder Internal Rotation 58 Degrees   Right Shoulder External Rotation 65 Degrees  rotations measured in sitting today   Left Shoulder Flexion 120 Degrees   Left Shoulder ABduction 130 Degrees   Left Shoulder Internal Rotation 70 Degrees   Left Shoulder External Rotation 65 Degrees           LYMPHEDEMA/ONCOLOGY QUESTIONNAIRE - 10/08/14 1002    Type   Cancer Type right breast   Lymphedema Assessments   Lymphedema Assessments Upper extremities   Right Upper Extremity Lymphedema   10 cm Proximal to Olecranon Process 48.1 cm   Olecranon Process 29.2 cm   10 cm Proximal to Ulnar Styloid Process 25.4 cm   Just Proximal to Ulnar Styloid Process 19.3 cm   Across Hand at [employer] 21.1 cm   At [place] of 2nd Digit 7.3 cm   Left Upper Extremity Lymphedema   10 cm Proximal to Olecranon Process 47.5 cm   Olecranon Process 30.3 cm   10 cm Proximal to Ulnar Styloid Process 26.3 cm   Just Proximal to Ulnar Styloid Process 19.6 cm   Across Hand at [employer] 19.9 cm   At [place] of  2nd Digit 7 cm           Quick Dash - 10/08/14 0001    Open a tight or new jar Mild difficulty   Do heavy household chores (wash walls, wash floors) Moderate difficulty   Carry a shopping bag or briefcase Mild difficulty   Wash your back Moderate  difficulty   Use a knife to cut food No difficulty   Recreational activities in which you take some force or impact through your arm, shoulder, or hand (golf, hammering, tennis) Severe difficulty   During the past week, to what extent has your arm, shoulder or hand problem interfered with your normal social activities with family, friends, neighbors, or groups? Modererately   During the past week, to what extent has your arm, shoulder or hand problem limited your work or other regular daily activities Modererately   Arm, shoulder, or hand pain. Moderate   Tingling (pins and needles) in your arm, shoulder, or hand Moderate   Difficulty Sleeping Moderate difficulty   DASH Score 43.18 %                     PT Education - 10/30/14 1258    Education provided Yes   Education Details gave NLN lymphedema risk reduction handout, gave [facility] Lymphedema pamphlet   Person(s) Educated Patient   Methods Explanation;Handout   Comprehension Need further instruction                [facility] Goals - 2014-10-30 1307    CC Long Term Goal  #1   Title Patient will be knowledgeable about lymphedema risk reduction.   Time 3   Period Weeks   Status New   CC Long Term Goal  #2   Title patient will be independent in HEP including strength ABC program and shoulder ROM   Time 3   Period Weeks   Status New   CC Long Term Goal  #3   Title Pt. will be knowledgeable about where and how to obtain a compression sleeve for right UE.   Time 3   Period Weeks   Status New            Plan - Oct 30, 2014 1259    Clinical Impression Statement Patient who is just over two weeks past surgery of right modified radical mastectomy and left  simple mastectomy; just had last two drains removed.  Has limited shoulder ROM bilaterally and is here to learn how to avoid lymphedema.  She does have a risk for lymphedema with having had 8 nodes removed and will have radiation treatment to the area.   Pt will benefit from skilled therapeutic intervention in order to improve on the following deficits Decreased range of motion;Impaired UE functional use;Decreased knowledge of precautions;Decreased knowledge of use of DME   Rehab Potential Excellent   PT Frequency --  3 visits (to 5 if needed)   PT Duration 3 weeks   PT Treatment/Interventions Therapeutic exercise;Patient/family education;DME Instruction;ADLs/Self Care Home Management   PT Next Visit Plan Begin instruction in strength ABC program, in shoulder ROM HEP, and in obtaining compression sleeve for right UE; also in lymphedema risk reduction practices.   Consulted and Agree with Plan of Care Patient          G-Codes - Oct 30, 2014 1308    Functional Assessment Tool Used quick DASH   Functional Limitation Carrying, moving and handling objects   Carrying, Moving and Handling Objects Current Status [phone]) At least 40 percent but less than 60 percent impaired, limited or restricted   Carrying, Moving and Handling Objects Goal Status ([id number]) At least 20 percent but less than 40 percent impaired, limited or restricted       Problem List Patient Active Problem List   Diagnosis Date Noted  . Breast cancer 09/22/2014  .  Hyperkalemia 04/02/2014  . Hypertension 04/02/2014  . Breast cancer of upper-outer quadrant of right female breast 02/09/2014    Markavious Micco 10/08/2014, 1:10 PM  [place], [place], [address] Phone: [phone]   Fax:  [place], PT 10/08/2014 1:10 PM

## 2014-10-11 ENCOUNTER — Ambulatory Visit: Payer: Medicare Other

## 2014-10-11 ENCOUNTER — Other Ambulatory Visit: Payer: Self-pay | Admitting: *Deleted

## 2014-10-11 DIAGNOSIS — M25612 Stiffness of left shoulder, not elsewhere classified: Secondary | ICD-10-CM

## 2014-10-11 DIAGNOSIS — M25611 Stiffness of right shoulder, not elsewhere classified: Secondary | ICD-10-CM

## 2014-10-11 DIAGNOSIS — R6889 Other general symptoms and signs: Secondary | ICD-10-CM

## 2014-10-11 DIAGNOSIS — Z9189 Other specified personal risk factors, not elsewhere classified: Secondary | ICD-10-CM | POA: Diagnosis not present

## 2014-10-11 DIAGNOSIS — C50411 Malignant neoplasm of upper-outer quadrant of right female breast: Secondary | ICD-10-CM

## 2014-10-11 MED ORDER — FUROSEMIDE 40 MG PO TABS: 40.0000 mg | ORAL_TABLET | Freq: Every day | ORAL | Status: DC

## 2014-10-11 NOTE — Therapy (Signed)
[place], [place], [address] Phone: [phone]   Fax:  [phone]  Physical Therapy Treatment  Patient Details  Name: Tonya Alvarez MRN: [id number] Date of Birth: [birth year] Referring Provider:  Nicholas Lose, MD  Encounter Date: 10/11/2014      PT End of Session - 10/11/14 0854    Visit Number 2   Number of Visits 6   Date for PT Re-Evaluation 11/05/14   PT Start Time 0808   PT Stop Time 0851   PT Time Calculation (min) 43 min   Activity Tolerance Patient tolerated treatment well   Behavior During Therapy [facility] for tasks assessed/performed      Past Medical History  Diagnosis Date  . Heart murmur     mitral valve  . Complication of anesthesia     O2 SAT  DROP   . Hypertension   . Neuromuscular disorder   . Type II diabetes mellitus   . Mitral regurgitation   . Hyperlipidemia   . Diabetic peripheral neuropathy   . Sinus headache     "seasonal"  . Arthritis     "knees, ankles" (09/22/2014  . DDD (degenerative disc disease), cervical   . DDD (degenerative disc disease), thoracolumbar   . DDD (degenerative disc disease), lumbosacral   . Chronic lower back pain   . Cancer of right breast     Past Surgical History  Procedure Laterality Date  . Portacath placement Right 02/23/2014    power port, tip cavo-atrial junction  . Mastectomy complete / simple Left 09/22/2014  . Mastectomy modified radical Right 09/22/2014  . Tonsillectomy  1955  . Total knee arthroplasty Left ~ 2009  . Joint replacement    . Breast biopsy Right 02/2014  . Tubal ligation  1980  . Mastectomy modified radical Right 09/22/2014    Procedure:  RIGHT MASTECTOMY MODIFIED RADICAL;  Surgeon: Coralie Keens, MD;  Location: [place];  Service: General;  Laterality: Right;  . Simple mastectomy with axillary sentinel node biopsy Left 09/22/2014    Procedure: LEFT SIMPLE MASTECTOMY;  Surgeon: Coralie Keens, MD;  Location: [place];  Service: General;  Laterality: Left;    There were no vitals filed for this visit.  Visit Diagnosis:  Stiffness of joint, shoulder region, right  Stiffness of joint, shoulder region, left  Impaired function of upper extremity  At risk for lymphedema      Subjective Assessment - 10/11/14 0814    Subjective Still have the nerve pain in Rt upper arm but its slowly getting better. Just feel sore overall in my Rt shoulder, chest and arm area.   Currently in Pain? No/denies                         [facility] Adult PT Treatment/Exercise - 10/11/14 0001    Shoulder Exercises: ROM/Strengthening   Other ROM/Strengthening Exercises Stretches from Strength ABC Packet: Bil chest, shoulder, tricep, calf, quadricep with chair and hand on wall, seated hamstring and piriformis figure 4 stretch modified with towel around ankle, and supine low trunk rotation stretch all 15 sec and 1 rep. And core strengthening: bridges, bil clam and leaning on edge of bed for Superwoman all 5 reps each. Mod cuing throuhgout for correct technique/posture.                 PT Education - 10/11/14 0854    Education provided Yes   Education Details Began instruction of  strength ABC packet and lymphedema risk reduction   Person(s) Educated Patient   Methods Explanation;Demonstration;Handout  Lymphedema risk handout   Comprehension Verbalized understanding;Returned demonstration;Need further instruction                [facility] Goals - 10/08/14 1307    CC Long Term Goal  #1   Title Patient will be knowledgeable about lymphedema risk reduction.   Time 3   Period Weeks   Status New   CC Long Term Goal  #2   Title patient will be independent in HEP including strength ABC program and shoulder ROM   Time 3   Period Weeks   Status New   CC Long Term Goal  #3   Title Pt. will be knowledgeable about where and how to obtain a compression sleeve for right UE.   Time 3   Period  Weeks   Status New            Plan - 10/11/14 6384    Clinical Impression Statement Did not issue handout for strength ABC packet yet, will do after completion. Pt tolerated treatment well today though some of exercises were a challenge due to pts limited mobility. Pt had good understadnign of lymphedema risk reduction as well.    Pt will benefit from skilled therapeutic intervention in order to improve on the following deficits Decreased range of motion;Impaired UE functional use;Decreased knowledge of precautions;Decreased knowledge of use of DME   Rehab Potential Excellent   PT Duration 3 weeks   PT Treatment/Interventions Therapeutic exercise;Patient/family education;DME Instruction;ADLs/Self Care Home Management   PT Next Visit Plan Continue instruction in strength ABC program, in shoulder ROM HEP, and in obtaining compression sleeve for right UE   Consulted and Agree with Plan of Care Patient        Problem List Patient Active Problem List   Diagnosis Date Noted  . Breast cancer 09/22/2014  . Hyperkalemia 04/02/2014  . Hypertension 04/02/2014  . Breast cancer of upper-outer quadrant of right female breast 02/09/2014    Otelia Limes, PTA 10/11/2014, 8:59 AM  [place], [place], [address] Phone: [phone]   Fax:  [phone]

## 2014-10-12 ENCOUNTER — Ambulatory Visit: Payer: Medicare Other | Admitting: Hematology and Oncology

## 2014-10-12 ENCOUNTER — Encounter: Payer: Self-pay | Admitting: Radiation Oncology

## 2014-10-12 NOTE — Progress Notes (Addendum)
Location of Breast Cancer: : Right Breast Upper outer quadrant   Histology per Pathology Report: 02/04/2014: .1.Right breast,biopsy mass 8 o'clock=Invasive Ductal Carcinoma 2. Right breast biopsy mass 12 o'clock=Invasive ductal carcinoma, Ductal carcinoma in situ 3.. 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  Did patient present with symptoms (if so, please note symptoms) or was this found on screening mammography?: Yes September 2015  Past/Anticipated interventions by surgeon, if HQP:[id number] 09/22/2014: 1. Breast, simple mastectomy, Left- FIBROCYSTIC CHANGES WITH CALCIFICATIONS.- RADIAL SCAR(S).- USUAL DUCTAL HYPERPLASIA.- THERE IS NO EVIDENCE OF MALIGNANCY. 2. 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 - PERINEURAL INVASION IS IDENTIFIED.- METASTATIC CARCINOMA IN 6 OF 8 LYMPH NODES (6/8), WITH EXTRACAPSULAR EXTENSION.- THE SURGICAL RESECTION MARGINS ARE NEGATIVE FOR CARCINOMA.Dr. Harvie Bridge  Past/Anticipated interventions by medical oncology, if any: Chemotherapy : Dr. Lindi Adie  03/05/2014 -  Neo-Adjuvant Chemotherapy Dose dense Adriamycin and Cytoxan x4 cycles followed by Abraxane weekly x12completed 07/19/14      07/28/14-Anastrozole 1mg  daily   10/06/14 Dr. Lindi Adie, MD  Note states  patient has j/p drains b/l mastectomy sides  Lymphedema issues, if any: 10/15/14 Lymphedema clinic  Pain issues, if any:  Rating pain a 2 constantly-burning over the surgical site  SAFETY ISSUES:  Prior radiation? NO  Pacemaker/ICD: NO  Possible  current:regnancy? NO  Is the patient on methotrexate? NO  Current Complaints / other details:  Retired Equities trader,  Menarche age 80 [place] 1st child born age 70, second age 42,Birth control pills 5 years,no HRT,  hx tubbal ligation, menopause age 25, DM,DDD,former cigarette smoker 1ppd, no alcohol or drug use No family  hx of breast.gyn/gi cancer .1,maternal aunt in ger 42's lung cancer, another maternal aunt in her 61's kidney cancer Allergies: Amoxicillin,N,V, lisinopril=cough,oxycodoen itching Husband just completed radiation treatment approximately one month ago.  She reports she is having some slight pain issues over right surgical site.  She reports she has been having fluid retention over bilateral breast areas.  She started on doxycyline 100mg  bid on 10/12/14 for some incision erythema.   Tonya Eaton, RN 10/12/2014,10:08 AM

## 2014-10-13 ENCOUNTER — Encounter: Payer: Self-pay | Admitting: Radiation Oncology

## 2014-10-13 ENCOUNTER — Ambulatory Visit
Admission: RE | Admit: 2014-10-13 | Discharge: 2014-10-13 | Disposition: A | Discharge status: Home / Self Care | Payer: Medicare Other | Source: Ambulatory Visit | Attending: Radiation Oncology | Admitting: Radiation Oncology

## 2014-10-13 VITALS — BP 139/76 | HR 66 | Temp 98.2°F | Resp 12 | Wt 309.4 lb

## 2014-10-13 DIAGNOSIS — Z7982 Long term (current) use of aspirin: Secondary | ICD-10-CM | POA: Diagnosis not present

## 2014-10-13 DIAGNOSIS — C7951 Secondary malignant neoplasm of bone: Secondary | ICD-10-CM | POA: Diagnosis not present

## 2014-10-13 DIAGNOSIS — C50411 Malignant neoplasm of upper-outer quadrant of right female breast: Secondary | ICD-10-CM

## 2014-10-13 DIAGNOSIS — Z794 Long term (current) use of insulin: Secondary | ICD-10-CM | POA: Insufficient documentation

## 2014-10-13 DIAGNOSIS — M1990 Unspecified osteoarthritis, unspecified site: Secondary | ICD-10-CM | POA: Insufficient documentation

## 2014-10-13 DIAGNOSIS — E1142 Type 2 diabetes mellitus with diabetic polyneuropathy: Secondary | ICD-10-CM | POA: Insufficient documentation

## 2014-10-13 DIAGNOSIS — Z510 Encounter for antineoplastic radiation therapy: Secondary | ICD-10-CM | POA: Insufficient documentation

## 2014-10-13 DIAGNOSIS — Z880 Allergy status to penicillin: Secondary | ICD-10-CM | POA: Diagnosis not present

## 2014-10-13 DIAGNOSIS — G8929 Other chronic pain: Secondary | ICD-10-CM | POA: Insufficient documentation

## 2014-10-13 DIAGNOSIS — E785 Hyperlipidemia, unspecified: Secondary | ICD-10-CM | POA: Diagnosis not present

## 2014-10-13 DIAGNOSIS — Z87891 Personal history of nicotine dependence: Secondary | ICD-10-CM | POA: Insufficient documentation

## 2014-10-13 DIAGNOSIS — Z170 Estrogen receptor positive status [ER+]: Secondary | ICD-10-CM | POA: Insufficient documentation

## 2014-10-13 DIAGNOSIS — I10 Essential (primary) hypertension: Secondary | ICD-10-CM | POA: Insufficient documentation

## 2014-10-13 DIAGNOSIS — Z9011 Acquired absence of right breast and nipple: Secondary | ICD-10-CM | POA: Diagnosis not present

## 2014-10-13 HISTORY — DX: Malignant neoplasm of unspecified site of unspecified female breast: C50.919

## 2014-10-13 NOTE — Progress Notes (Signed)
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 female who presented with a palpable mass in right breast detected on self exam.  PET scan showed to have disease in axillary lymph nodes and oligo metastatic disease in the proximal right humerus. Neo adjuvant chemotherapy was delivered, followed by right modified rad mastectomy on May 18, 16. Multifocal disease was appreciated.  Margins are clear. 6/8 right axillary nodes positive. Left mastectomy was performed as well (tissue not malignant).  Tonya Alvarez was done to withdraw fluid by Dr. Lindi Alvarez. Pt denies pain in the right shoulder. Pt states have some burning across the chest and right arm postoperatively Pt has had some PT for preventing lymphedema. Pt can raise arm above head and c/o of some pulling when doing so. Patient said she started  anti estrogen therapy. Husband just completed radiation treatment approximately one month ago. She reports she is having some slight pain issues over right surgical site. She reports she has been having fluid retention over bilateral breast areas. She started on doxycyline 100mg  bid on 10/12/14 for some incision erythema.   Weight stable.  PREVIOUS RADIATION THERAPY: No  PAST MEDICAL HISTORY:  has a past medical history of Heart murmur; Complication of anesthesia; Hypertension; Neuromuscular disorder; Type II diabetes mellitus; Mitral regurgitation; Hyperlipidemia; Diabetic peripheral neuropathy; Sinus headache; Arthritis; DDD (degenerative disc disease), cervical; DDD (degenerative disc disease), thoracolumbar; DDD (degenerative disc  disease), lumbosacral; Chronic lower back pain; Cancer of right breast; and Breast cancer (02/04/14).    PAST SURGICAL HISTORY: Past Surgical History  Procedure Laterality Date  . Portacath placement Right 02/23/2014    power port, tip cavo-atrial junction  . Mastectomy complete / simple Left 09/22/2014  . Mastectomy modified radical Right 09/22/2014  . Tonsillectomy  1955  . Total knee arthroplasty Left ~ 2009  . Joint replacement    . Breast biopsy Right 02/2014  . Tubal ligation  1980  . Mastectomy modified radical Right 09/22/2014    Procedure:  RIGHT MASTECTOMY MODIFIED RADICAL;  Surgeon: Coralie Keens, MD;  Location: [place];  Service: General;  Laterality: Right;  . Simple mastectomy with axillary sentinel node biopsy Left 09/22/2014    Procedure: LEFT SIMPLE MASTECTOMY;  Surgeon: Coralie Keens, MD;  Location: [place];  Service: General;  Laterality: Left;    FAMILY HISTORY: family history includes Cancer in her father and maternal aunt; Hypertension in her maternal uncle.  SOCIAL HISTORY:  reports that she quit smoking about 17 months ago. Her smoking use included Cigarettes. She has a 22.5 pack-year smoking history. She has never used smokeless tobacco. She reports that she does not drink alcohol or use illicit drugs.  ALLERGIES: Amoxicillin; Lisinopril; and Oxycodone  MEDICATIONS:  Current Outpatient Prescriptions  Medication Sig Dispense Refill  . doxycycline (DORYX) 100 MG DR capsule Take 100 mg by mouth 2 (two) times daily.    Marland Kitchen acetaminophen (TYLENOL) 500 MG tablet Take 500 mg by mouth every 6 (six) hours as needed.    Marland Kitchen anastrozole (ARIMIDEX) 1 MG tablet Take 1 tablet (1 mg total) by mouth daily. 90 tablet 3  . aspirin 81 MG tablet Take 81  mg by mouth at bedtime.     . calcium-vitamin D (OSCAL WITH D) 500-200 MG-UNIT per tablet Take 1 tablet by mouth daily.    . fluticasone (FLONASE) 50 MCG/ACT nasal spray Place 1 spray into both nostrils daily as needed for allergies or  rhinitis. 16 g 0  . furosemide (LASIX) 40 MG tablet Take 1 tablet (40 mg total) by mouth daily. 90 tablet 0  . gabapentin (NEURONTIN) 300 MG capsule Take 300 mg by mouth 2 (two) times daily.    Marland Kitchen ibuprofen (ADVIL,MOTRIN) 600 MG tablet Take 600 mg by mouth every 12 (twelve) hours.    . insulin lispro protamine-lispro (HUMALOG 75/25 MIX) (75-25) 100 UNIT/ML SUSP injection Inject 60-68 Units into the skin 2 (two) times daily with a meal. Inject 60-68 units into the skin 2 times daily, and 10 ml in the middle of the day    . lidocaine-prilocaine (EMLA) cream Apply 1 application topically as needed. 30 g 6  . LORazepam (ATIVAN) 0.5 MG tablet Take 1 tablet (0.5 mg total) by mouth every 6 (six) hours as needed (Nausea or vomiting). (Patient not taking: Reported on 10/08/2014) 30 tablet 0  . losartan (COZAAR) 50 MG tablet Take 50 mg by mouth every morning.    . metFORMIN (GLUCOPHAGE) 500 MG tablet Take 1,000 mg by mouth 2 (two) times daily with a meal.     . Multiple Vitamin (MULTIVITAMIN WITH MINERALS) TABS tablet Take 1 tablet by mouth at bedtime.    Marland Kitchen oxyCODONE (OXY IR/ROXICODONE) 5 MG immediate release tablet Take 1-2 tablets (5-10 mg total) by mouth every 6 (six) hours as needed for moderate pain, severe pain or breakthrough pain. 30 tablet 0  . prochlorperazine (COMPAZINE) 10 MG tablet Take 1 tablet (10 mg total) by mouth every 6 (six) hours as needed for nausea or vomiting. (Patient not taking: Reported on 10/08/2014) 30 tablet 0  . simvastatin (ZOCOR) 10 MG tablet Take 20 mg by mouth every evening.     Marland Kitchen tiZANidine (ZANAFLEX) 2 MG tablet Take 2 mg by mouth at bedtime as needed for muscle spasms. Pt takes this medication as needed     No current facility-administered medications for this encounter.    REVIEW OF SYSTEMS:  Notable for that above.   PHYSICAL EXAM:  weight is 309 lb 6.4 oz (140.343 kg). Her oral temperature is 98.2 F (36.8 C). Her blood pressure is 139/76 and her pulse is 66. Her  respiration is 12 and oxygen saturation is 96%.   General: Alert and oriented, in no acute distress HEENT: Head is normocephalic. Extraocular movements are intact. Oropharynx is clear. Neck: Neck is supple, no palpable cervical or supraclavicular lymphadenopathy. Heart: Regular in rate and rhythm with a soft systolic murmur Chest: Clear to auscultation bilaterally, with no rhonchi, wheezes, or rales. Abdomen: Soft, nontender, nondistended, with no rigidity or guarding. Extremities: mild ankle edema. Lymphatics: see Neck Exam Skin: No concerning lesions. Musculoskeletal: symmetric strength and muscle tone throughout. Neurologic: Cranial nerves II through XII are grossly intact. No obvious focalities. Speech is fluent. Coordination is intact. Psychiatric: Judgment and insight are intact. Affect is appropriate. Breast exam: She has bilateral mastectomy scar healing; Moderate erythema /swelling around the lateral right mastectomy scar  ECOG = 1  0 - Asymptomatic (Fully active, able to carry on all predisease activities without restriction)  1 - Symptomatic but completely ambulatory (Restricted in physically strenuous activity but ambulatory and able to carry out work of a light or sedentary nature.  For example, light housework, office work)  2 - Symptomatic, <50% in bed during the day (Ambulatory and capable of all self care but unable to carry out any work activities. Up and about more than 50% of waking hours)  3 - Symptomatic, >50% in bed, but not bedbound (Capable of only limited self-care, confined to bed or chair 50% or more of waking hours)  4 - Bedbound (Completely disabled. Cannot carry on any self-care. Totally confined to bed or chair)  5 - Death   Eustace Pen MM, Creech RH, Tormey DC, et al. [phone]). "Toxicity and response criteria of the [facility]". [place] Oncol. 5 (6): 649-55   LABORATORY DATA:  Lab Results  Component Value Date   WBC 8.1 09/23/2014    HGB 11.1* 09/23/2014   HCT 34.4* 09/23/2014   MCV 88.0 09/23/2014   PLT 242 09/23/2014   CMP     Component Value Date/Time   NA 137 09/23/2014 0458   NA 142 07/19/2014 1005   K 4.3 09/23/2014 0458   K 4.1 07/19/2014 1005   CL 101 09/23/2014 0458   CO2 28 09/23/2014 0458   CO2 25 07/19/2014 1005   GLUCOSE 268* 09/23/2014 0458   GLUCOSE 161* 07/19/2014 1005   BUN 14 09/23/2014 0458   BUN 12.9 07/19/2014 1005   CREATININE 0.85 09/23/2014 0458   CREATININE 0.7 07/19/2014 1005   CALCIUM 8.9 09/23/2014 0458   CALCIUM 9.3 07/19/2014 1005   PROT 6.5 07/19/2014 1005   PROT 7.2 08/01/2010 1746   ALBUMIN 3.6 07/19/2014 1005   ALBUMIN 4.1 08/01/2010 1746   AST 15 07/19/2014 1005   AST 26 08/01/2010 1746   ALT 21 07/19/2014 1005   ALT 20 08/01/2010 1746   ALKPHOS 78 07/19/2014 1005   ALKPHOS 74 08/01/2010 1746   BILITOT 0.31 07/19/2014 1005   BILITOT 0.4 08/01/2010 1746   GFRNONAA >60 09/23/2014 0458   GFRAA >60 09/23/2014 0458         RADIOGRAPHY: No results found.    IMPRESSION/PLAN: This is a very nice patient with locally advanced right breast cancer, metastatic to bone, ER positive. I think she is a good candidate for adjuvant therapy to the right chest wall and regional nodes. Although she is asymptomatic in the shoulder it reasonable to deliver  radiotherapy to the proximal right humerus given that this is her only site of oligometastatic disease.   I will have our scheduler call her to arrange simulation for end of June to allow more healing.   It was a pleasure meeting the patient today. We discussed the risks, benefits, and side effects of radiotherapy, which can consist of lymphedema, shoulder soreness. We discussed that radiation would take approximately 7 weeks to complete and that I would give the patient a few weeks to heal following surgery before starting treatment planning. We spoke about acute effects including skin irritation and fatigue as well as much less  common late effects including lung irritation. We spoke about the latest technology that is used to minimize the risk of late effects for breast cancer patients undergoing radiotherapy. No guarantees of treatment were given. The patient is enthusiastic about proceeding with treatment. I look forward to participating in the patient's care.  The patient is to continue PT because she is at risk for lymphedema.  This document serves as a record of services personally performed by Eppie Gibson, MD. It was created on her behalf by Jeralene Peters, a trained medical scribe.  The creation of this record is based on the scribe's personal observations and the provider's statements to them. This document has been checked and approved by the attending provider.       __________________________________________   Eppie Gibson, MD

## 2014-10-14 ENCOUNTER — Other Ambulatory Visit: Payer: Self-pay | Admitting: Hematology and Oncology

## 2014-10-15 ENCOUNTER — Ambulatory Visit: Payer: Medicare Other | Admitting: Physical Therapy

## 2014-10-18 ENCOUNTER — Other Ambulatory Visit: Payer: Self-pay | Admitting: Hematology and Oncology

## 2014-10-18 DIAGNOSIS — C50411 Malignant neoplasm of upper-outer quadrant of right female breast: Secondary | ICD-10-CM

## 2014-10-19 ENCOUNTER — Ambulatory Visit: Payer: Medicare Other | Admitting: Physical Therapy

## 2014-10-19 DIAGNOSIS — Z9189 Other specified personal risk factors, not elsewhere classified: Secondary | ICD-10-CM | POA: Diagnosis not present

## 2014-10-19 DIAGNOSIS — M25612 Stiffness of left shoulder, not elsewhere classified: Secondary | ICD-10-CM | POA: Diagnosis not present

## 2014-10-19 DIAGNOSIS — M25611 Stiffness of right shoulder, not elsewhere classified: Secondary | ICD-10-CM | POA: Diagnosis not present

## 2014-10-19 DIAGNOSIS — R6889 Other general symptoms and signs: Secondary | ICD-10-CM

## 2014-10-19 NOTE — Therapy (Signed)
[place], [place], [address] Phone: [phone]   Fax:  [phone]  Physical Therapy Treatment  Patient Details  Name: Tonya Alvarez MRN: [id number] Date of Birth: 1945-09-08 Referring Provider:  Nicholas Lose, MD  Encounter Date: 10/19/2014      PT End of Session - 10/19/14 1954    Visit Number 3   Number of Visits 6   Date for PT Re-Evaluation 11/05/14   PT Start Time 4315   PT Stop Time 1520   PT Time Calculation (min) 45 min   Activity Tolerance Patient limited by fatigue;Patient limited by pain   Behavior During Therapy [facility] for tasks assessed/performed      Past Medical History  Diagnosis Date  . Heart murmur     mitral valve  . Complication of anesthesia     O2 SAT  DROP   . Hypertension   . Neuromuscular disorder   . Type II diabetes mellitus   . Mitral regurgitation   . Hyperlipidemia   . Diabetic peripheral neuropathy   . Sinus headache     "seasonal"  . Arthritis     "knees, ankles" (09/22/2014  . DDD (degenerative disc disease), cervical   . DDD (degenerative disc disease), thoracolumbar   . DDD (degenerative disc disease), lumbosacral   . Chronic lower back pain   . Cancer of right breast   . Breast cancer 02/04/14    right breast    Past Surgical History  Procedure Laterality Date  . Portacath placement Right 02/23/2014    power port, tip cavo-atrial junction  . Mastectomy complete / simple Left 09/22/2014  . Mastectomy modified radical Right 09/22/2014  . Tonsillectomy  1955  . Total knee arthroplasty Left ~ 2009  . Joint replacement    . Breast biopsy Right 02/2014  . Tubal ligation  1980  . Mastectomy modified radical Right 09/22/2014    Procedure:  RIGHT MASTECTOMY MODIFIED RADICAL;  Surgeon: Coralie Keens, MD;  Location: [facility];  Service: General;  Laterality: Right;  . Simple mastectomy with axillary sentinel node biopsy Left 09/22/2014    Procedure: LEFT  SIMPLE MASTECTOMY;  Surgeon: Coralie Keens, MD;  Location: [place];  Service: General;  Laterality: Left;    There were no vitals filed for this visit.  Visit Diagnosis:  Stiffness of joint, shoulder region, right  Stiffness of joint, shoulder region, left  Impaired function of upper extremity  At risk for lymphedema      Subjective Assessment - 10/19/14 1437    Subjective Doing better today than yesterday:  I had to go back to the surgeon and he drew a little more than half a liter off the right side.   Currently in Pain? No/denies  a little tender where the fluid was, if I touch it                         [facility] Adult PT Treatment/Exercise - 10/19/14 0001    Exercises   Exercises Other Exercises   Other Exercises  Strength ABC program instruction given:  handout given and patient was shown elements of it.  Then instructed in progressive resistance part of the program and patient performed the following:  chest press 2 lbs. x 10x 2; squats 0# x 5; standing "W" 0# x 10 x 2; standing side leg lift 0# x 10; scaption against wall 2# x 10; tricep kickbacks 2# x 10; calf raises  0# x 10; bicep curl 2# x 10 x 2.  Steps not done due to knee pain; patient was fatigued after this much and could not do more.                PT Education - 10/19/14 1953    Education provided Yes   Education Details strength ABC program with focus today on resistance portion   Person(s) Educated Patient   Methods Explanation;Demonstration;Handout   Comprehension Verbalized understanding;Returned demonstration                [facility] Goals - 10/19/14 1959    CC Long Term Goal  #1   Status On-going   CC Long Term Goal  #2   Status On-going   CC Long Term Goal  #3   Status On-going            Plan - 10/19/14 1955    Clinical Impression Statement Issued handout for strength ABC today, as patient was able to get through to the end of it (starting with  resistance portion today).  Patient was fatigued with exercise and also had right knee pain with some exercise.   Pt will benefit from skilled therapeutic intervention in order to improve on the following deficits Decreased range of motion;Impaired UE functional use;Decreased knowledge of precautions;Decreased knowledge of use of DME   Rehab Potential Excellent   PT Frequency --  3-5 treatment visits after initial eval   PT Duration 3 weeks   PT Treatment/Interventions Therapeutic exercise;Patient/family education   PT Next Visit Plan Review strength ABC program.  Check goals and continue toward those; probable DC next visit.   PT Home Exercise Plan strength ABC program twice a week   Consulted and Agree with Plan of Care Patient        Problem List Patient Active Problem List   Diagnosis Date Noted  . Breast cancer 09/22/2014  . Hyperkalemia 04/02/2014  . Hypertension 04/02/2014  . Breast cancer of upper-outer quadrant of right female breast 02/09/2014    SALIS[place],DONNA 10/19/2014, 8:00 PM  [place], [place], [address] Phone: [phone]   Fax:  [place], PT 10/19/2014 8:00 PM

## 2014-10-25 ENCOUNTER — Ambulatory Visit: Payer: Medicare Other | Admitting: Physical Therapy

## 2014-10-25 DIAGNOSIS — Z9189 Other specified personal risk factors, not elsewhere classified: Secondary | ICD-10-CM

## 2014-10-25 DIAGNOSIS — M25611 Stiffness of right shoulder, not elsewhere classified: Secondary | ICD-10-CM | POA: Diagnosis not present

## 2014-10-25 DIAGNOSIS — R6889 Other general symptoms and signs: Secondary | ICD-10-CM

## 2014-10-25 DIAGNOSIS — M25612 Stiffness of left shoulder, not elsewhere classified: Secondary | ICD-10-CM

## 2014-10-25 NOTE — Patient Instructions (Signed)
Cane Overhead - Supine  Hold cane at thighs with both hands, extend arms straight over head. Hold _2__ seconds. Repeat _5_ times. Do __2_ times per day.

## 2014-10-25 NOTE — Therapy (Addendum)
[place], [place], [address] Phone: [phone]   Fax:  [phone]  Physical Therapy Treatment  Patient Details  Name: Tonya Alvarez MRN: [id number] Date of Birth: [birth year] Referring Provider:  Nicholas Lose, MD  Encounter Date: 10/25/2014      PT End of Session - 10/25/14 1153    Visit Number 4   Number of Visits 6   Date for PT Re-Evaluation 11/05/14   PT Start Time 1100   PT Stop Time 1144   PT Time Calculation (min) 44 min      Past Medical History  Diagnosis Date  . Heart murmur     mitral valve  . Complication of anesthesia     O2 SAT  DROP   . Hypertension   . Neuromuscular disorder   . Type II diabetes mellitus   . Mitral regurgitation   . Hyperlipidemia   . Diabetic peripheral neuropathy   . Sinus headache     "seasonal"  . Arthritis     "knees, ankles" (09/22/2014  . DDD (degenerative disc disease), cervical   . DDD (degenerative disc disease), thoracolumbar   . DDD (degenerative disc disease), lumbosacral   . Chronic lower back pain   . Cancer of right breast   . Breast cancer 02/04/14    right breast    Past Surgical History  Procedure Laterality Date  . Portacath placement Right 02/23/2014    power port, tip cavo-atrial junction  . Mastectomy complete / simple Left 09/22/2014  . Mastectomy modified radical Right 09/22/2014  . Tonsillectomy  1955  . Total knee arthroplasty Left ~ 2009  . Joint replacement    . Breast biopsy Right 02/2014  . Tubal ligation  1980  . Mastectomy modified radical Right 09/22/2014    Procedure:  RIGHT MASTECTOMY MODIFIED RADICAL;  Surgeon: Coralie Keens, MD;  Location: [facility];  Service: General;  Laterality: Right;  . Simple mastectomy with axillary sentinel node biopsy Left 09/22/2014    Procedure: LEFT SIMPLE MASTECTOMY;  Surgeon: Coralie Keens, MD;  Location: [place];  Service: General;  Laterality: Left;    There were no vitals  filed for this visit.  Visit Diagnosis:  Stiffness of joint, shoulder region, right  Stiffness of joint, shoulder region, left  Impaired function of upper extremity  At risk for lymphedema      Subjective Assessment - 10/25/14 1148    Subjective Doing pretty well.  she has her radiation simulation next Weds the 29.  she feels that she is working on her range of motion and its going pretty well.  She still has fluid accumulation in right chest  She brings in a completed ABC log. and has 2-3 pounds at home                LYMPHEDEMA/ONCOLOGY QUESTIONNAIRE - 10/25/14 1132    Type   Cancer Type .sup                  [facility] Adult PT Treatment/Exercise - 10/25/14 0001    Shoulder Exercises: ROM/Strengthening   Other ROM/Strengthening Exercises verbally reviewed strengh ABC packet and log sheet.  Pt had pain after doing squats and was advised to do standing glute and quad strong isometrics instead. answered all other questions   Other ROM/Strengthening Exercises supine cane for bilateral shoulder felxion.  supine with right  hand behind head , elbow supported and head rotated to left to prepare for radiation position  PT Education - 10/25/14 1152    Education provided Yes   Education Details reinforced range of motion to right shoulder flexion and cane exercise, reviewed lymphedema reduction practices and where to get compression/prosthetic bras and sleeve.    Person(s) Educated Patient   Methods Explanation;Handout   Comprehension Verbalized understanding;Returned demonstration                [facility] Goals - 10/25/14 1119    CC Long Term Goal  #1   Title Patient will be knowledgeable about lymphedema risk reduction.   Status Achieved   CC Long Term Goal  #2   Title patient will be independent in HEP including strength ABC program and shoulder ROM   Status On-going   CC Long Term Goal  #3   Title Pt. will be knowledgeable  about where and how to obtain a compression sleeve for right UE.   Status On-going            Plan - 10/25/14 1155    Clinical Impression Statement Pt has been doing the Strength ABC exercise at home with log and  self modification according to her symptoms from prior back pain.  She plans to see surgeon this week to drain more fluid off her chest and to purchase compression bras in the next couple of weeks. She will ask [employer] if they think they can fit her for an off the shelf garment.  If not, will refer to [facility].   She has adequate range of motion for radiation. Reinforced the benefit for progressive stregthening in light of having 8 nodes removed, but feel she shoulde stick with 2# for now considering upcoming radaiation and continuing problems with fluid accumulation in chest.  She does not feel the exercise caused any increase in this so far.  Her long term plan is to return to  water aerobics once skin is all healed.  She does not need further PT at this point, but pt will call back when radiation is complete for re-evaluation for exercise progression and how to move forward for sleeve.     PT Next Visit Plan Re-evaluate after radiation. Do Quick DASH for g code   PT Home Exercise Plan strength ABC program twice a week   Consulted and Agree with Plan of Care Patient        Problem List Patient Active Problem List   Diagnosis Date Noted  . Breast cancer 09/22/2014  . Hyperkalemia 04/02/2014  . Hypertension 04/02/2014  . Breast cancer of upper-outer quadrant of right female breast 02/09/2014   Donato Heinz. Owens Shark, PT   Norwood Levo 10/25/2014, 12:06 PM       PHYSICAL THERAPY DISCHARGE SUMMARY  Visits from Start of Care: 4  Current functional level related to goals / functional outcomes: unknown   Remaining deficits: As above    Education / Equipment: As above  Plan: Patient agrees to discharge.  Patient goals were partially met. Patient  is being discharged due to being pleased with the current functional level.  ?????        Maudry Diego, PT 06/09/2015 10:53 AM   [place], [place], [address] Phone: [phone]   Fax:  [phone]

## 2014-10-27 NOTE — Addendum Note (Signed)
Encounter addended by: Jenene Slicker, RN on: 10/27/2014  9:18 AM<BR>     Documentation filed: Charges VN

## 2014-11-01 ENCOUNTER — Other Ambulatory Visit: Payer: Self-pay

## 2014-11-03 ENCOUNTER — Ambulatory Visit
Admission: RE | Admit: 2014-11-03 | Discharge: 2014-11-03 | Disposition: A | Discharge status: Home / Self Care | Payer: Medicare Other | Source: Ambulatory Visit | Attending: Radiation Oncology | Admitting: Radiation Oncology

## 2014-11-03 DIAGNOSIS — E1142 Type 2 diabetes mellitus with diabetic polyneuropathy: Secondary | ICD-10-CM | POA: Diagnosis not present

## 2014-11-03 DIAGNOSIS — Z7982 Long term (current) use of aspirin: Secondary | ICD-10-CM | POA: Diagnosis not present

## 2014-11-03 DIAGNOSIS — C7951 Secondary malignant neoplasm of bone: Secondary | ICD-10-CM | POA: Diagnosis not present

## 2014-11-03 DIAGNOSIS — Z170 Estrogen receptor positive status [ER+]: Secondary | ICD-10-CM | POA: Diagnosis not present

## 2014-11-03 DIAGNOSIS — Z880 Allergy status to penicillin: Secondary | ICD-10-CM | POA: Diagnosis not present

## 2014-11-03 DIAGNOSIS — C50411 Malignant neoplasm of upper-outer quadrant of right female breast: Secondary | ICD-10-CM

## 2014-11-03 DIAGNOSIS — Z87891 Personal history of nicotine dependence: Secondary | ICD-10-CM | POA: Diagnosis not present

## 2014-11-03 DIAGNOSIS — Z794 Long term (current) use of insulin: Secondary | ICD-10-CM | POA: Diagnosis not present

## 2014-11-03 DIAGNOSIS — E785 Hyperlipidemia, unspecified: Secondary | ICD-10-CM | POA: Diagnosis not present

## 2014-11-03 DIAGNOSIS — G8929 Other chronic pain: Secondary | ICD-10-CM | POA: Diagnosis not present

## 2014-11-03 DIAGNOSIS — Z9011 Acquired absence of right breast and nipple: Secondary | ICD-10-CM | POA: Diagnosis not present

## 2014-11-03 DIAGNOSIS — Z510 Encounter for antineoplastic radiation therapy: Secondary | ICD-10-CM | POA: Diagnosis not present

## 2014-11-03 DIAGNOSIS — M1990 Unspecified osteoarthritis, unspecified site: Secondary | ICD-10-CM | POA: Diagnosis not present

## 2014-11-03 DIAGNOSIS — I10 Essential (primary) hypertension: Secondary | ICD-10-CM | POA: Diagnosis not present

## 2014-11-03 NOTE — Progress Notes (Signed)
  Radiation Oncology         [phone] ________________________________  Name: Tonya Alvarez MRN: [id number]  Date: 11/03/2014  DOB: 06/07/1945  SIMULATION AND TREATMENT PLANNING NOTE    Outpatient  DIAGNOSIS:     ICD-9-CM ICD-10-CM   1. Breast cancer of upper-outer quadrant of right female breast 174.4 C50.411     NARRATIVE:  The patient was brought to the [facility].  Identity was confirmed.  All relevant records and images related to the planned course of therapy were reviewed.  The patient freely provided informed written consent to proceed with treatment after reviewing the details related to the planned course of therapy. The consent form was witnessed and verified by the simulation staff.    Then, the patient was set-up in a stable reproducible supine position for radiation therapy with her ipsilateral arm over her head, and her upper body secured in a custom-made Vac-lok device.  CT images were obtained.  Surface markings were placed.  The CT images were loaded into the planning software.    TREATMENT PLANNING NOTE: Treatment planning then occurred.  The radiation prescription was entered and confirmed.     A total of 5 medically necessary complex treatment devices were fabricated and supervised by me: 4 fields with MLCs for custom blocks to protect heart, and lungs;  and, a Vac-lok. I have requested : 3D Simulation  I have requested a DVH of the following structures: lungs, heart, esophagus, and cord.    The patient will receive 50.4 Gy in 28 fractions to the right chest wall, axilla, SCV nodes, and right humeral head 4 photon fields.   This will  be followed by a chest wall scar boost.  Optical Surface Tracking Plan:  Since intensity modulated radiotherapy (IMRT) and 3D conformal radiation treatment methods are predicated on accurate and precise positioning for treatment, intrafraction motion monitoring is medically necessary to ensure accurate and safe  treatment delivery. The ability to quantify intrafraction motion without excessive ionizing radiation dose can only be performed with optical surface tracking. Accordingly, surface imaging offers the opportunity to obtain 3D measurements of patient position throughout IMRT and 3D treatments without excessive radiation exposure. I am ordering optical surface tracking for this patient's upcoming course of radiotherapy.  ________________________________   Reference:  Ursula Alert, J, et al. Surface imaging-based analysis of intrafraction motion for breast radiotherapy patients.Journal of [place], n. 6, nov. 2014. ISSN [id number].  Available at: <http://www.jacmp.org/index.php/jacmp/article/view/4957>.    -----------------------------------  Eppie Gibson, MD

## 2014-11-04 ENCOUNTER — Other Ambulatory Visit: Payer: Self-pay | Admitting: *Deleted

## 2014-11-04 DIAGNOSIS — C50411 Malignant neoplasm of upper-outer quadrant of right female breast: Secondary | ICD-10-CM

## 2014-11-04 MED ORDER — LORAZEPAM 0.5 MG PO TABS: 0.5000 mg | ORAL_TABLET | Freq: Four times a day (QID) | ORAL | Status: DC | PRN

## 2014-11-05 ENCOUNTER — Telehealth: Payer: Self-pay | Admitting: Hematology and Oncology

## 2014-11-05 DIAGNOSIS — Z880 Allergy status to penicillin: Secondary | ICD-10-CM | POA: Diagnosis not present

## 2014-11-05 DIAGNOSIS — Z794 Long term (current) use of insulin: Secondary | ICD-10-CM | POA: Diagnosis not present

## 2014-11-05 DIAGNOSIS — Z87891 Personal history of nicotine dependence: Secondary | ICD-10-CM | POA: Diagnosis not present

## 2014-11-05 DIAGNOSIS — E1142 Type 2 diabetes mellitus with diabetic polyneuropathy: Secondary | ICD-10-CM | POA: Diagnosis not present

## 2014-11-05 DIAGNOSIS — Z9011 Acquired absence of right breast and nipple: Secondary | ICD-10-CM | POA: Diagnosis not present

## 2014-11-05 DIAGNOSIS — Z7982 Long term (current) use of aspirin: Secondary | ICD-10-CM | POA: Diagnosis not present

## 2014-11-05 DIAGNOSIS — C7951 Secondary malignant neoplasm of bone: Secondary | ICD-10-CM | POA: Diagnosis not present

## 2014-11-05 DIAGNOSIS — C50411 Malignant neoplasm of upper-outer quadrant of right female breast: Secondary | ICD-10-CM | POA: Diagnosis not present

## 2014-11-05 DIAGNOSIS — E785 Hyperlipidemia, unspecified: Secondary | ICD-10-CM | POA: Diagnosis not present

## 2014-11-05 DIAGNOSIS — Z170 Estrogen receptor positive status [ER+]: Secondary | ICD-10-CM | POA: Diagnosis not present

## 2014-11-05 DIAGNOSIS — G8929 Other chronic pain: Secondary | ICD-10-CM | POA: Diagnosis not present

## 2014-11-05 DIAGNOSIS — I10 Essential (primary) hypertension: Secondary | ICD-10-CM | POA: Diagnosis not present

## 2014-11-05 DIAGNOSIS — M1990 Unspecified osteoarthritis, unspecified site: Secondary | ICD-10-CM | POA: Diagnosis not present

## 2014-11-05 DIAGNOSIS — Z510 Encounter for antineoplastic radiation therapy: Secondary | ICD-10-CM | POA: Diagnosis not present

## 2014-11-05 NOTE — Telephone Encounter (Signed)
Spoke with patient and she is aware of her new appointment 8/9,r/s from 8/5

## 2014-11-12 ENCOUNTER — Ambulatory Visit
Admission: RE | Admit: 2014-11-12 | Discharge: 2014-11-12 | Disposition: A | Discharge status: Home / Self Care | Payer: Medicare Other | Source: Ambulatory Visit | Attending: Radiation Oncology | Admitting: Radiation Oncology

## 2014-11-12 DIAGNOSIS — C50411 Malignant neoplasm of upper-outer quadrant of right female breast: Secondary | ICD-10-CM | POA: Diagnosis not present

## 2014-11-12 DIAGNOSIS — Z510 Encounter for antineoplastic radiation therapy: Secondary | ICD-10-CM | POA: Diagnosis not present

## 2014-11-12 DIAGNOSIS — I10 Essential (primary) hypertension: Secondary | ICD-10-CM | POA: Diagnosis not present

## 2014-11-12 DIAGNOSIS — G8929 Other chronic pain: Secondary | ICD-10-CM | POA: Diagnosis not present

## 2014-11-12 DIAGNOSIS — M1990 Unspecified osteoarthritis, unspecified site: Secondary | ICD-10-CM | POA: Diagnosis not present

## 2014-11-12 DIAGNOSIS — E785 Hyperlipidemia, unspecified: Secondary | ICD-10-CM | POA: Diagnosis not present

## 2014-11-12 DIAGNOSIS — Z880 Allergy status to penicillin: Secondary | ICD-10-CM | POA: Diagnosis not present

## 2014-11-12 DIAGNOSIS — E1142 Type 2 diabetes mellitus with diabetic polyneuropathy: Secondary | ICD-10-CM | POA: Diagnosis not present

## 2014-11-12 DIAGNOSIS — C7951 Secondary malignant neoplasm of bone: Secondary | ICD-10-CM | POA: Diagnosis not present

## 2014-11-12 DIAGNOSIS — Z7982 Long term (current) use of aspirin: Secondary | ICD-10-CM | POA: Diagnosis not present

## 2014-11-12 DIAGNOSIS — Z794 Long term (current) use of insulin: Secondary | ICD-10-CM | POA: Diagnosis not present

## 2014-11-12 DIAGNOSIS — Z9011 Acquired absence of right breast and nipple: Secondary | ICD-10-CM | POA: Diagnosis not present

## 2014-11-12 DIAGNOSIS — Z87891 Personal history of nicotine dependence: Secondary | ICD-10-CM | POA: Diagnosis not present

## 2014-11-12 DIAGNOSIS — Z170 Estrogen receptor positive status [ER+]: Secondary | ICD-10-CM | POA: Diagnosis not present

## 2014-11-15 ENCOUNTER — Ambulatory Visit
Admission: RE | Admit: 2014-11-15 | Discharge: 2014-11-15 | Disposition: A | Discharge status: Home / Self Care | Payer: Medicare Other | Source: Ambulatory Visit | Attending: Radiation Oncology | Admitting: Radiation Oncology

## 2014-11-15 ENCOUNTER — Telehealth: Payer: Self-pay | Admitting: Oncology

## 2014-11-15 ENCOUNTER — Encounter: Payer: Self-pay | Admitting: Radiation Oncology

## 2014-11-15 VITALS — BP 135/79 | HR 67 | Temp 98.5°F | Wt 306.0 lb

## 2014-11-15 DIAGNOSIS — E119 Type 2 diabetes mellitus without complications: Secondary | ICD-10-CM | POA: Diagnosis not present

## 2014-11-15 DIAGNOSIS — Z87891 Personal history of nicotine dependence: Secondary | ICD-10-CM | POA: Diagnosis not present

## 2014-11-15 DIAGNOSIS — Z7982 Long term (current) use of aspirin: Secondary | ICD-10-CM | POA: Diagnosis not present

## 2014-11-15 DIAGNOSIS — C50411 Malignant neoplasm of upper-outer quadrant of right female breast: Secondary | ICD-10-CM | POA: Diagnosis not present

## 2014-11-15 DIAGNOSIS — E1142 Type 2 diabetes mellitus with diabetic polyneuropathy: Secondary | ICD-10-CM | POA: Diagnosis not present

## 2014-11-15 DIAGNOSIS — Z880 Allergy status to penicillin: Secondary | ICD-10-CM | POA: Diagnosis not present

## 2014-11-15 DIAGNOSIS — E785 Hyperlipidemia, unspecified: Secondary | ICD-10-CM | POA: Diagnosis not present

## 2014-11-15 DIAGNOSIS — Y838 Other surgical procedures as the cause of abnormal reaction of the patient, or of later complication, without mention of misadventure at the time of the procedure: Secondary | ICD-10-CM | POA: Diagnosis not present

## 2014-11-15 DIAGNOSIS — M1990 Unspecified osteoarthritis, unspecified site: Secondary | ICD-10-CM | POA: Diagnosis not present

## 2014-11-15 DIAGNOSIS — Z510 Encounter for antineoplastic radiation therapy: Secondary | ICD-10-CM | POA: Insufficient documentation

## 2014-11-15 DIAGNOSIS — C7951 Secondary malignant neoplasm of bone: Secondary | ICD-10-CM | POA: Diagnosis not present

## 2014-11-15 DIAGNOSIS — T8131XA Disruption of external operation (surgical) wound, not elsewhere classified, initial encounter: Secondary | ICD-10-CM | POA: Insufficient documentation

## 2014-11-15 DIAGNOSIS — Z170 Estrogen receptor positive status [ER+]: Secondary | ICD-10-CM | POA: Diagnosis not present

## 2014-11-15 DIAGNOSIS — I10 Essential (primary) hypertension: Secondary | ICD-10-CM | POA: Diagnosis not present

## 2014-11-15 DIAGNOSIS — G8929 Other chronic pain: Secondary | ICD-10-CM | POA: Diagnosis not present

## 2014-11-15 DIAGNOSIS — Z794 Long term (current) use of insulin: Secondary | ICD-10-CM | POA: Diagnosis not present

## 2014-11-15 DIAGNOSIS — Z9011 Acquired absence of right breast and nipple: Secondary | ICD-10-CM | POA: Diagnosis not present

## 2014-11-15 MED ORDER — ALRA NON-METALLIC DEODORANT (RAD-ONC)
1.0000 "application " | Freq: Once | TOPICAL | Status: AC
  Administered 2014-11-15: 1 via TOPICAL

## 2014-11-15 MED ORDER — RADIAPLEXRX EX GEL
Freq: Once | CUTANEOUS | Status: AC
  Administered 2014-11-15: 14:00:00 via TOPICAL

## 2014-11-15 NOTE — Progress Notes (Signed)
Pt here for patient teaching.  Pt given Radiation and You booklet, skin care instructions, Alra deodorant and Radiaplex gel. Reviewed areas of pertinence such as  . Pt able to give teach back of to pat skin, use unscented/gentle soap and drink plenty of water,apply Radiaplex bid, avoid applying anything to skin within 4 hours of treatment, avoid wearing an under wire bra and to use an electric razor if they must shave. Pt needs reinforcement and verbalizes understanding of information given and will contact nursing with any questions or concerns.        

## 2014-11-15 NOTE — Progress Notes (Addendum)
   Weekly Management Note:  Outpatient    ICD-9-CM ICD-10-CM   1. Breast cancer of upper-outer quadrant of right female breast 174.4 Q73.419 non-metallic deodorant (ALRA) 1 application     hyaluronate sodium (RADIAPLEXRX) gel    Current Dose:  1.8 Gy  Projected Dose: 60.4 Gy   Narrative:  The patient presents for routine under treatment assessment.  CBCT/MVCT images/Port film x-rays were reviewed.  The chart was checked. Since simulation she has noted re-accumulation of fluid in the right chest wall (as evidenced by tissue plethora on portal imaging last week) and also the scab fell off of the lateral aspect of her right mastectomy scar.  Now she has an open area there. She is diabetic.  Physical Findings:  weight is 306 lb (138.801 kg). Her temperature is 98.5 F (36.9 C). Her blood pressure is 135/79 and her pulse is 67.   Wt Readings from Last 3 Encounters:  11/15/14 306 lb (138.801 kg)  10/13/14 309 lb 6.4 oz (140.343 kg)  10/06/14 305 lb 12.8 oz (138.71 kg)   Right lateral chest wall scar has a dehiscence that is at  least 2cm wide.  Portal imaging as above.  Impression:  The patient is tolerating radiotherapy but has healing issues r/t surgery  Plan:  Discussed with Dr Dalbert Batman. May hold RT. He will see her today and call me thereafter ________________________________   Eppie Gibson, M.D.

## 2014-11-15 NOTE — Telephone Encounter (Signed)
Called Envi and asked that she call us at [phone] when she is able to restart radiation.  She verbalized agreement.  Per Santiago Glad, Dr. Dalbert Batman removed 260 cc of fluid from her right breast and 200 cc of fluid from her left breast.  She said she was told it may be a month before she can start radiation.  She also mentioned that a drain may be placed.  She said she will see her surgeon, Dr. Ninfa Linden on Monday.

## 2014-11-16 ENCOUNTER — Ambulatory Visit: Payer: Medicare Other

## 2014-11-17 ENCOUNTER — Ambulatory Visit: Payer: Medicare Other

## 2014-11-18 ENCOUNTER — Ambulatory Visit: Payer: Medicare Other

## 2014-11-19 ENCOUNTER — Ambulatory Visit: Payer: Medicare Other

## 2014-11-21 ENCOUNTER — Other Ambulatory Visit: Payer: Self-pay | Admitting: Hematology and Oncology

## 2014-11-22 ENCOUNTER — Ambulatory Visit: Payer: Medicare Other

## 2014-11-22 NOTE — Telephone Encounter (Signed)
Chart reviewed.  Last ov 10/06/14.  Next ov 12/14/14.

## 2014-11-23 ENCOUNTER — Ambulatory Visit: Payer: Medicare Other

## 2014-11-24 ENCOUNTER — Ambulatory Visit: Payer: Medicare Other

## 2014-11-25 ENCOUNTER — Ambulatory Visit: Payer: Medicare Other

## 2014-11-25 ENCOUNTER — Ambulatory Visit (HOSPITAL_BASED_OUTPATIENT_CLINIC_OR_DEPARTMENT_OTHER): Payer: Medicare Other

## 2014-11-25 VITALS — BP 130/65 | HR 73 | Temp 98.4°F

## 2014-11-25 DIAGNOSIS — Z452 Encounter for adjustment and management of vascular access device: Secondary | ICD-10-CM

## 2014-11-25 DIAGNOSIS — C50411 Malignant neoplasm of upper-outer quadrant of right female breast: Secondary | ICD-10-CM

## 2014-11-25 DIAGNOSIS — Z95828 Presence of other vascular implants and grafts: Secondary | ICD-10-CM

## 2014-11-25 MED ORDER — SODIUM CHLORIDE 0.9 % IJ SOLN
10.0000 mL | INTRAMUSCULAR | Status: DC | PRN
  Administered 2014-11-25: 10 mL via INTRAVENOUS
  Filled 2014-11-25: qty 10

## 2014-11-25 MED ORDER — HEPARIN SOD (PORK) LOCK FLUSH 100 UNIT/ML IV SOLN
500.0000 [IU] | Freq: Once | INTRAVENOUS | Status: AC
  Administered 2014-11-25: 500 [IU] via INTRAVENOUS
  Filled 2014-11-25: qty 5

## 2014-11-25 NOTE — Patient Instructions (Signed)

## 2014-11-26 ENCOUNTER — Ambulatory Visit: Payer: Medicare Other

## 2014-11-29 ENCOUNTER — Ambulatory Visit: Payer: Medicare Other

## 2014-11-29 DIAGNOSIS — C50919 Malignant neoplasm of unspecified site of unspecified female breast: Secondary | ICD-10-CM | POA: Diagnosis not present

## 2014-11-29 DIAGNOSIS — Z9010 Acquired absence of unspecified breast and nipple: Secondary | ICD-10-CM | POA: Diagnosis not present

## 2014-11-30 ENCOUNTER — Ambulatory Visit: Payer: Medicare Other

## 2014-11-30 DIAGNOSIS — C50919 Malignant neoplasm of unspecified site of unspecified female breast: Secondary | ICD-10-CM | POA: Diagnosis not present

## 2014-12-01 ENCOUNTER — Ambulatory Visit: Payer: Medicare Other

## 2014-12-01 ENCOUNTER — Telehealth: Payer: Self-pay

## 2014-12-01 NOTE — Telephone Encounter (Signed)
Order for [phone] also faxed.  Sent to scan.

## 2014-12-01 NOTE — Telephone Encounter (Signed)
Ordr faxed to [facility].  Sent to scan.

## 2014-12-02 ENCOUNTER — Ambulatory Visit: Payer: Medicare Other

## 2014-12-03 ENCOUNTER — Ambulatory Visit: Payer: Medicare Other

## 2014-12-03 ENCOUNTER — Telehealth: Payer: Self-pay

## 2014-12-03 NOTE — Telephone Encounter (Signed)
Called patient to check on healing of skin.She has been seen by Dr.Blackmon twice weekly for aspiration.85 cc aspirated on 12/03/14.She states Dr.Blackmon wants patient to follow up with Dr.Squire the week of 12/06/14.patient will see surgeon again on 12/08/14 around 10:30 am and will then see Dr.Squire around 11:15 am to assess starting radiation treatments.I will put last 3 notes in Dr.Squires' in-box.

## 2014-12-06 ENCOUNTER — Ambulatory Visit: Payer: Medicare Other

## 2014-12-07 ENCOUNTER — Ambulatory Visit: Payer: Medicare Other

## 2014-12-08 ENCOUNTER — Ambulatory Visit
Admission: RE | Admit: 2014-12-08 | Discharge: 2014-12-08 | Disposition: A | Discharge status: Home / Self Care | Payer: Medicare Other | Source: Ambulatory Visit | Attending: Radiation Oncology | Admitting: Radiation Oncology

## 2014-12-08 ENCOUNTER — Ambulatory Visit: Payer: Medicare Other

## 2014-12-08 VITALS — BP 134/74 | HR 75 | Temp 98.4°F | Wt 309.9 lb

## 2014-12-08 DIAGNOSIS — C50411 Malignant neoplasm of upper-outer quadrant of right female breast: Secondary | ICD-10-CM

## 2014-12-08 NOTE — Progress Notes (Signed)
   Weekly Management Note:  Outpatient    ICD-9-CM ICD-10-CM   1. Breast cancer of upper-outer quadrant of right female breast 174.4 C50.411     Current Dose:  1.8 Gy  Projected Dose: 60.4 Gy   Narrative:  The patient presents for routine under treatment assessment.  CBCT/MVCT images/Port film x-rays were reviewed.  The chart was checked. She has had several aspirations of fluid at least twice weekly over the last 3 weeks. Today Dr. Ninfa Linden applied silver nitrate and did a wet to dry dressing. Pt is to follow up in 3 days. Antibiotic therapy completed 2 weeks ago.  Physical Findings:  weight is 309 lb 14.4 oz (140.57 kg). Her temperature is 98.4 F (36.9 C). Her blood pressure is 134/74 and her pulse is 75.   Wt Readings from Last 3 Encounters:  12/08/14 309 lb 14.4 oz (140.57 kg)  11/15/14 306 lb (138.801 kg)  10/13/14 309 lb 6.4 oz (140.343 kg)   She continues to have an opened wound on the lateral right chest wall scar.  Impression:  The still  has healing issues related to surgery, DM.  Plan: patient knows to call us when cleared by surgery to resume RT. Until then, hold RT  This document serves as a record of services personally performed by Eppie Gibson, MD. It was created on her behalf by Darcus Austin, a trained medical scribe. The creation of this record is based on the scribe's personal observations and the provider's statements to them. This document has been checked and approved by the attending provider.     ________________________________   Eppie Gibson, M.D.

## 2014-12-08 NOTE — Progress Notes (Addendum)
Patient for follow up break radiation to right chest wall.She has had several aspirations of fluid at least twice weekly over the last 3 weeks.Today Dr.Blackmon applied silver nitrate and did a wet to dry dressing.patient to follow up in 3 days.Anti-biotic therapy completed 2 weeks ago.BP 134/74 mmHg  Pulse 75  Temp(Src) 98.4 F (36.9 C)  Wt 309 lb 14.4 oz (140.57 kg)

## 2014-12-09 ENCOUNTER — Ambulatory Visit: Payer: Medicare Other

## 2014-12-09 ENCOUNTER — Ambulatory Visit: Payer: Medicare Other | Admitting: Hematology and Oncology

## 2014-12-09 DIAGNOSIS — Z9010 Acquired absence of unspecified breast and nipple: Secondary | ICD-10-CM | POA: Diagnosis not present

## 2014-12-09 DIAGNOSIS — C50919 Malignant neoplasm of unspecified site of unspecified female breast: Secondary | ICD-10-CM | POA: Diagnosis not present

## 2014-12-10 ENCOUNTER — Ambulatory Visit: Payer: Medicare Other

## 2014-12-10 ENCOUNTER — Ambulatory Visit: Payer: Medicare Other | Admitting: Hematology and Oncology

## 2014-12-13 ENCOUNTER — Ambulatory Visit: Payer: Medicare Other | Admitting: Radiation Oncology

## 2014-12-13 ENCOUNTER — Ambulatory Visit: Payer: Medicare Other

## 2014-12-13 NOTE — Assessment & Plan Note (Addendum)
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 Followed by 12 cycles of weekly Abraxane completed 07/19/2014  S/P Mastectomy 09/22/14: Bilateral mastectomies: Right: IDC Grade 2, 2.7cm, LVI, PNI, 6/8 LN Positive, Er 90%, PR 5% T2N2 (Stage IIIA)  Plan: 1. Adjuvant XRT To the chest wall and axilla as well as to the left humerus. 2. Foll by Anti-estrogen therapy  Her husband is going through head and neck cancer radiation simultaneously. He got radiation with Dr. Isidore Moos as well.

## 2014-12-14 ENCOUNTER — Encounter: Payer: Self-pay | Admitting: Hematology and Oncology

## 2014-12-14 ENCOUNTER — Telehealth: Payer: Self-pay | Admitting: Hematology and Oncology

## 2014-12-14 ENCOUNTER — Ambulatory Visit: Payer: Medicare Other

## 2014-12-14 ENCOUNTER — Ambulatory Visit (HOSPITAL_BASED_OUTPATIENT_CLINIC_OR_DEPARTMENT_OTHER): Payer: Medicare Other | Admitting: Hematology and Oncology

## 2014-12-14 VITALS — BP 132/56 | HR 73 | Temp 97.7°F | Resp 18 | Ht 69.5 in | Wt 305.2 lb

## 2014-12-14 DIAGNOSIS — C50411 Malignant neoplasm of upper-outer quadrant of right female breast: Secondary | ICD-10-CM

## 2014-12-14 DIAGNOSIS — Z170 Estrogen receptor positive status [ER+]: Secondary | ICD-10-CM | POA: Diagnosis not present

## 2014-12-14 DIAGNOSIS — Z79811 Long term (current) use of aromatase inhibitors: Secondary | ICD-10-CM

## 2014-12-14 NOTE — Telephone Encounter (Signed)
Gave avs & calendar for December. °

## 2014-12-14 NOTE — Progress Notes (Signed)
Patient Care Team: Jacelyn Pi, MD as PCP - General (Endocrinology)  DIAGNOSIS: Breast cancer of upper-outer quadrant of right female breast   Staging form: Breast, AJCC 7th Edition     Clinical: Stage IIIA (T3, N1, M1) - Signed by Rulon Eisenmenger, MD on 03/05/2014     Pathologic: T3, N1a, M1 - Unsigned   SUMMARY OF ONCOLOGIC HISTORY:   Breast cancer of upper-outer quadrant of right female breast   02/02/2014 Mammogram Right breast: 5.8 cm irregular high-density solid mass suggestive of malignancy; left breast next millimeter internal mammary lymph node   02/04/2014 Initial Biopsy  2 biopsies were performed the right breast and axilla: Grade 2 invasive ductal carcinoma ER/PR positive HER-2 negative Ki-67 20% to 47%: Biopsy of right humerus metastatic breast cancer estrogen positive   02/17/2014 PET scan Hypermetabolic right breast cancer and right axillary lymph nodes subpectoral lymph nodes indeterminate right middle lobe pulmonary nodule and hypermetabolic proximal right humerus lesion   03/05/2014 - 07/19/2014 Neo-Adjuvant Chemotherapy Dose dense Adriamycin and Cytoxan x4 cycles followed by Abraxane weekly x12   07/26/2014 Breast MRI Multifocal breast cancer decreased in size, retroareolar 13 mm now 11 mm, middle third 22 mm now 18 mm, 3.9 x 3.9 cm now 3.9 x 2.6 cm, multiple smaller nodules decreased in size, right x-ray lymph node 3.6 cm now 2 cm other lymph nodes are smaller   07/28/2014 -  Anti-estrogen oral therapy Anastrozole 1 mg daily   08/02/2014 PET scan Interval improvement in the right breast mass and axillary/subpectoral lymph nodes and right humerus head. Several subpleural nodules are not visible, 3.4. cm adrenal adenoma   09/22/2014 Surgery Bilateral mastectomy: Right: IDC Grade 2, 2.7cm, LVI, PNI, 6/8 LN Positive, Er 90%, PR 5% T2N2    CHIEF COMPLIANT: Follow-up of breast cancer  INTERVAL HISTORY: Tonya Alvarez is a 69 year old with above-mentioned history of bilateral  mastectomy who unfortunately has not healed in the right breast area and currently has nonhealing wound which is being packed. This has led to delay in radiation treatment. She is currently taking antiestrogen therapy with anastrozole. She is tolerating it fairly well except for hot flashes. Her husband is completed radiation therapy and still at home requires her assistance with his day-to-day activities.  REVIEW OF SYSTEMS:   Constitutional: Denies fevers, chills or abnormal weight loss Eyes: Denies blurriness of vision Ears, nose, mouth, throat, and face: Denies mucositis or sore throat Respiratory: Denies cough, dyspnea or wheezes Cardiovascular: Denies palpitation, chest discomfort or lower extremity swelling Gastrointestinal:  Denies nausea, heartburn or change in bowel habits Skin: Denies abnormal skin rashes Lymphatics: Denies new lymphadenopathy or easy bruising Neurological:Denies numbness, tingling or new weaknesses Behavioral/Psych: Mood is stable, no new changes  Breast: Nonhealing right breast wound All other systems were reviewed with the patient and are negative.  I have reviewed the past medical history, past surgical history, social history and family history with the patient and they are unchanged from previous note.  ALLERGIES:  is allergic to amoxicillin; hydrocodone; and lisinopril.  MEDICATIONS:  Current Outpatient Prescriptions  Medication Sig Dispense Refill  . acetaminophen (TYLENOL) 500 MG tablet Take 500 mg by mouth every 6 (six) hours as needed.    Marland Kitchen anastrozole (ARIMIDEX) 1 MG tablet Take 1 tablet (1 mg total) by mouth daily. 90 tablet 3  . aspirin 81 MG tablet Take 81 mg by mouth at bedtime.     . calcium-vitamin D (OSCAL WITH D) 500-200 MG-UNIT per tablet Take 1 tablet  by mouth daily.    . fluticasone (FLONASE) 50 MCG/ACT nasal spray Place 1 spray into both nostrils daily as needed for allergies or rhinitis. (Patient not taking: Reported on 12/08/2014) 16 g 0   . furosemide (LASIX) 40 MG tablet Take 1 tablet by mouth  daily 90 tablet 0  . gabapentin (NEURONTIN) 300 MG capsule Take 300 mg by mouth 2 (two) times daily.    Marland Kitchen ibuprofen (ADVIL,MOTRIN) 600 MG tablet Take 600 mg by mouth every 12 (twelve) hours.    . insulin lispro protamine-lispro (HUMALOG 75/25 MIX) (75-25) 100 UNIT/ML SUSP injection Inject 60-68 Units into the skin 2 (two) times daily with a meal. Inject 60-68 units into the skin 2 times daily, and 10 ml in the middle of the day    . lidocaine-prilocaine (EMLA) cream Apply 1 application topically as needed. 30 g 6  . LORazepam (ATIVAN) 0.5 MG tablet Take 1 tablet (0.5 mg total) by mouth every 6 (six) hours as needed (Nausea or vomiting). (Patient not taking: Reported on 11/15/2014) 90 tablet 0  . losartan (COZAAR) 50 MG tablet Take 50 mg by mouth every morning.    . metFORMIN (GLUCOPHAGE) 500 MG tablet Take 1,000 mg by mouth 2 (two) times daily with a meal.     . Multiple Vitamin (MULTIVITAMIN WITH MINERALS) TABS tablet Take 1 tablet by mouth at bedtime.    . prochlorperazine (COMPAZINE) 10 MG tablet Take 1 tablet (10 mg total) by mouth every 6 (six) hours as needed for nausea or vomiting. (Patient not taking: Reported on 10/08/2014) 30 tablet 0  . simvastatin (ZOCOR) 10 MG tablet Take 20 mg by mouth every evening.     Marland Kitchen tiZANidine (ZANAFLEX) 2 MG tablet Take 2 mg by mouth at bedtime as needed for muscle spasms. Pt takes this medication as needed     No current facility-administered medications for this visit.    PHYSICAL EXAMINATION: ECOG PERFORMANCE STATUS: 1 - Symptomatic but completely ambulatory  Filed Vitals:   12/14/14 1011  BP: 132/56  Pulse: 73  Temp: 97.7 F (36.5 C)  Resp: 18   Filed Weights   12/14/14 1011  Weight: 305 lb 3.2 oz (138.438 kg)    GENERAL:alert, no distress and comfortable SKIN: skin color, texture, turgor are normal, no rashes or significant lesions EYES: normal, Conjunctiva are pink and non-injected,  sclera clear OROPHARYNX:no exudate, no erythema and lips, buccal mucosa, and tongue normal  NECK: supple, thyroid normal size, non-tender, without nodularity LYMPH:  no palpable lymphadenopathy in the cervical, axillary or inguinal LUNGS: clear to auscultation and percussion with normal breathing effort HEART: regular rate & rhythm and no murmurs and no lower extremity edema ABDOMEN:abdomen soft, non-tender and normal bowel sounds Musculoskeletal:no cyanosis of digits and no clubbing  NEURO: alert & oriented x 3 with fluent speech, no focal motor/sensory deficits  LABORATORY DATA:  I have reviewed the data as listed   Chemistry      Component Value Date/Time   NA 137 09/23/2014 0458   NA 142 07/19/2014 1005   K 4.3 09/23/2014 0458   K 4.1 07/19/2014 1005   CL 101 09/23/2014 0458   CO2 28 09/23/2014 0458   CO2 25 07/19/2014 1005   BUN 14 09/23/2014 0458   BUN 12.9 07/19/2014 1005   CREATININE 0.85 09/23/2014 0458   CREATININE 0.7 07/19/2014 1005      Component Value Date/Time   CALCIUM 8.9 09/23/2014 0458   CALCIUM 9.3 07/19/2014 1005   ALKPHOS  78 07/19/2014 1005   ALKPHOS 74 08/01/2010 1746   AST 15 07/19/2014 1005   AST 26 08/01/2010 1746   ALT 21 07/19/2014 1005   ALT 20 08/01/2010 1746   BILITOT 0.31 07/19/2014 1005   BILITOT 0.4 08/01/2010 1746       Lab Results  Component Value Date   WBC 8.1 09/23/2014   HGB 11.1* 09/23/2014   HCT 34.4* 09/23/2014   MCV 88.0 09/23/2014   PLT 242 09/23/2014   NEUTROABS 3.7 07/19/2014   ASSESSMENT & PLAN:  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 Followed by 12 cycles of weekly Abraxane completed 07/19/2014  S/P Mastectomy 09/22/14: Bilateral mastectomies: Right: IDC Grade 2, 2.7cm, LVI, PNI, 6/8 LN  Positive, Er 90%, PR 5% T2N2 (Stage IIIA) Nonhealing right breast wound: Being packed  Plan: 1. Adjuvant XRT To the chest wall and axilla as well as to the left humerus when the wound is healed probably in the next month. 2. Continue with Anti-estrogen therapy for now. She will stop it and radiation starts. She will resume after radiation is complete. Her husband is going through head and neck cancer radiation simultaneously. He got radiation with Dr. Isidore Moos as well.  Return to clinic in 4 months for follow-up.   No orders of the defined types were placed in this encounter.   The patient has a good understanding of the overall plan. she agrees with it. she will call with any problems that may develop before the next visit here.   Rulon Eisenmenger, MD

## 2014-12-15 ENCOUNTER — Ambulatory Visit: Payer: Medicare Other

## 2014-12-16 ENCOUNTER — Ambulatory Visit: Payer: Medicare Other

## 2014-12-17 ENCOUNTER — Ambulatory Visit: Payer: Medicare Other

## 2014-12-20 ENCOUNTER — Ambulatory Visit: Payer: Medicare Other

## 2014-12-21 ENCOUNTER — Ambulatory Visit: Payer: Medicare Other

## 2014-12-22 ENCOUNTER — Ambulatory Visit: Payer: Medicare Other

## 2014-12-23 ENCOUNTER — Ambulatory Visit: Payer: Medicare Other

## 2014-12-24 ENCOUNTER — Ambulatory Visit: Payer: Medicare Other

## 2014-12-27 ENCOUNTER — Ambulatory Visit: Payer: Medicare Other

## 2014-12-28 ENCOUNTER — Ambulatory Visit: Payer: Medicare Other

## 2014-12-29 ENCOUNTER — Ambulatory Visit: Payer: Medicare Other

## 2015-01-03 ENCOUNTER — Telehealth: Payer: Self-pay | Admitting: Radiation Oncology

## 2015-01-03 NOTE — Telephone Encounter (Signed)
Per Dr. Pearlie Oyster order phoned patient to inquire if she has been released by her surgeon to resume RT. No answer. Left message requesting return call.

## 2015-01-11 ENCOUNTER — Telehealth: Payer: Self-pay | Admitting: Radiation Oncology

## 2015-01-11 NOTE — Telephone Encounter (Signed)
Returned message left by patient. Patient reports she was seen by Dr. Rush Farmer today. She explains that she now has an open right chest wound abscess. Patient states, "he told me it would be six months before I was able to start radiation." This RN phoned Abigail Butts, RN at Dr. Pryor Montes office. She explains that Dr. Rush Farmer placed no time frame on the patient starting radiation other than she couldn't do so until her wound was healed. Also, Abigail Butts reports that Dr. Rush Farmer started wet to dry dressing changes, doxycycline and plans to see her back on Friday. Phoned patient back and explaining such. Reinforced that she is on Arimidex which lessens her risk for reoccurrence.  Explained this RN will inform Dr. Isidore Moos of these findings and call her back if Dr. Isidore Moos has further instructions. Patient verbalized understanding and expressed appreciation for the return call.

## 2015-01-31 NOTE — Progress Notes (Signed)
  Radiation Oncology         [phone]) [phone] ________________________________  Name: Tonya Alvarez MRN: [id number]  Date: 11/15/2014  DOB: Sep 05, 1945  End of Treatment Note  Diagnosis:    ympT2, ypN2a clinical M1 Stage IV Right breast invasive ductal carcinoma, ER+     Indication for treatment:  Locoregional control       Radiation treatment dates:   11/15/14  Site/dose:   Right humerus, chest wall, and regional nodes - she received only one fraction (1.8 Gy)  Beams/energy:   3D conformal / 6 10 and 15 MV photons  Narrative: The patient's treatment was placed on hold after one fraction due to partial dehiscence of the chest wall scar. This was not felt to be related to her single fraction of radiotherapy. She was sent back to her surgeon.  It was ultimately determined that she had a chest wall abscess.   Plan: Treatment has been terminated until she is released by her surgeon.  -----------------------------------  Eppie Gibson, MD

## 2015-02-22 ENCOUNTER — Other Ambulatory Visit: Payer: Self-pay | Admitting: Hematology and Oncology

## 2015-02-24 ENCOUNTER — Telehealth: Payer: Self-pay

## 2015-02-24 NOTE — Telephone Encounter (Signed)
LMOVM - Pt to return call to clinic re: lasix refill request.  Per [facility] note in Feb 2016, lasix to be used until swelling gone.  Pt has been getting refills for 8 months.  Need clarification on use prior to refill

## 2015-03-01 ENCOUNTER — Telehealth: Payer: Self-pay | Admitting: Hematology and Oncology

## 2015-03-01 NOTE — Telephone Encounter (Signed)
Returned patients call as she needs a flush,made appointment and left her a message

## 2015-03-02 ENCOUNTER — Other Ambulatory Visit: Payer: Self-pay | Admitting: *Deleted

## 2015-03-02 ENCOUNTER — Telehealth: Payer: Self-pay | Admitting: Hematology and Oncology

## 2015-03-02 DIAGNOSIS — C50411 Malignant neoplasm of upper-outer quadrant of right female breast: Secondary | ICD-10-CM

## 2015-03-02 MED ORDER — SODIUM CHLORIDE 0.9 % IJ SOLN
10.0000 mL | Freq: Once | INTRAMUSCULAR | Status: DC
  Filled 2015-03-02: qty 10

## 2015-03-02 MED ORDER — HEPARIN SOD (PORK) LOCK FLUSH 100 UNIT/ML IV SOLN
500.0000 [IU] | Freq: Once | INTRAVENOUS | Status: DC
  Filled 2015-03-02: qty 5

## 2015-03-02 NOTE — Telephone Encounter (Signed)
Returned patients call and she moved her flush to 10/27

## 2015-03-03 ENCOUNTER — Ambulatory Visit (HOSPITAL_BASED_OUTPATIENT_CLINIC_OR_DEPARTMENT_OTHER): Payer: Medicare Other

## 2015-03-03 VITALS — BP 147/72 | HR 73 | Temp 98.3°F | Resp 18

## 2015-03-03 DIAGNOSIS — C50411 Malignant neoplasm of upper-outer quadrant of right female breast: Secondary | ICD-10-CM

## 2015-03-03 DIAGNOSIS — Z452 Encounter for adjustment and management of vascular access device: Secondary | ICD-10-CM | POA: Diagnosis not present

## 2015-03-03 MED ORDER — SODIUM CHLORIDE 0.9 % IJ SOLN
10.0000 mL | Freq: Once | INTRAMUSCULAR | Status: AC
  Administered 2015-03-03: 10 mL
  Filled 2015-03-03: qty 10

## 2015-03-03 MED ORDER — HEPARIN SOD (PORK) LOCK FLUSH 100 UNIT/ML IV SOLN
500.0000 [IU] | Freq: Once | INTRAVENOUS | Status: AC
  Administered 2015-03-03: 500 [IU]
  Filled 2015-03-03: qty 5

## 2015-03-14 ENCOUNTER — Ambulatory Visit
Admission: RE | Admit: 2015-03-14 | Discharge: 2015-03-14 | Disposition: A | Discharge status: Home / Self Care | Payer: Medicare Other | Source: Ambulatory Visit | Attending: Radiation Oncology | Admitting: Radiation Oncology

## 2015-03-14 DIAGNOSIS — C50411 Malignant neoplasm of upper-outer quadrant of right female breast: Secondary | ICD-10-CM | POA: Insufficient documentation

## 2015-03-14 DIAGNOSIS — Z510 Encounter for antineoplastic radiation therapy: Secondary | ICD-10-CM | POA: Insufficient documentation

## 2015-03-14 NOTE — Progress Notes (Signed)
  Radiation Oncology         (336) [phone] ________________________________  Name: Tonya Alvarez MRN: [id number]  Date: 03/14/2015  DOB: 10/15/1945  SIMULATION AND TREATMENT PLANNING NOTE    Outpatient  DIAGNOSIS:     ICD-9-CM ICD-10-CM   1. Breast cancer of upper-outer quadrant of right female breast ([place]) 174.4 C50.411     NARRATIVE:  The patient was brought to the [facility].  Her abscess has resolved - she is released by her surgeon, physical exam is satisfactory- and she is ready for re-simulation. Identity was confirmed.  All relevant records and images related to the planned course of therapy were reviewed.  The patient freely provided informed written consent to proceed with treatment after reviewing the details related to the planned course of therapy. The consent form was witnessed and verified by the simulation staff.    Then, the patient was set-up in a stable reproducible supine position for radiation therapy with her ipsilateral arm over her head, and her upper body secured in a custom-made Vac-lok device.  CT images were obtained.  Surface markings were placed.  The CT images were loaded into the planning software.    TREATMENT PLANNING NOTE: Treatment planning then occurred.  The radiation prescription was entered and confirmed.     A total of 5 medically necessary complex treatment devices were fabricated and supervised by me: 4 fields with MLCs for custom blocks to protect heart, and lungs;  and, a Vac-lok.  MORE FIELDS MAY BE ADDED AS NEEDED IN DOSIMETRY. I have requested : 3D Simulation  I have requested a DVH of the following structures: lungs, heart, esophagus, and cord.    The patient will receive 50.4 Gy in 28 fractions to the right chest wall,  and 46.8 Gy in 26 fractions to the axilla, SCV nodes, and right humeral head w/ at least 4 photon fields.   This will  be followed by a chest wall scar boost.  Optical Surface Tracking Plan:  Since intensity  modulated radiotherapy (IMRT) and 3D conformal radiation treatment methods are predicated on accurate and precise positioning for treatment, intrafraction motion monitoring is medically necessary to ensure accurate and safe treatment delivery. The ability to quantify intrafraction motion without excessive ionizing radiation dose can only be performed with optical surface tracking. Accordingly, surface imaging offers the opportunity to obtain 3D measurements of patient position throughout IMRT and 3D treatments without excessive radiation exposure. I am ordering optical surface tracking for this patient's upcoming course of radiotherapy.  ________________________________   Reference:  Ursula Alert, J, et al. Surface imaging-based analysis of intrafraction motion for breast radiotherapy patients.Journal of [place], n. 6, nov. 2014. ISSN [id number].  Available at: <http://www.jacmp.org/index.php/jacmp/article/view/4957>.    -----------------------------------  Eppie Gibson, MD

## 2015-03-16 DIAGNOSIS — Z510 Encounter for antineoplastic radiation therapy: Secondary | ICD-10-CM | POA: Diagnosis not present

## 2015-03-16 DIAGNOSIS — C50411 Malignant neoplasm of upper-outer quadrant of right female breast: Secondary | ICD-10-CM | POA: Diagnosis not present

## 2015-03-21 ENCOUNTER — Ambulatory Visit
Admission: RE | Admit: 2015-03-21 | Discharge: 2015-03-21 | Disposition: A | Discharge status: Home / Self Care | Payer: Medicare Other | Source: Ambulatory Visit | Attending: Radiation Oncology | Admitting: Radiation Oncology

## 2015-03-21 DIAGNOSIS — Z510 Encounter for antineoplastic radiation therapy: Secondary | ICD-10-CM | POA: Diagnosis not present

## 2015-03-21 DIAGNOSIS — G609 Hereditary and idiopathic neuropathy, unspecified: Secondary | ICD-10-CM | POA: Diagnosis not present

## 2015-03-21 DIAGNOSIS — E7800 Pure hypercholesterolemia, unspecified: Secondary | ICD-10-CM | POA: Diagnosis not present

## 2015-03-21 DIAGNOSIS — Z23 Encounter for immunization: Secondary | ICD-10-CM | POA: Diagnosis not present

## 2015-03-21 DIAGNOSIS — Z79899 Other long term (current) drug therapy: Secondary | ICD-10-CM | POA: Diagnosis not present

## 2015-03-21 DIAGNOSIS — C50411 Malignant neoplasm of upper-outer quadrant of right female breast: Secondary | ICD-10-CM | POA: Diagnosis not present

## 2015-03-21 DIAGNOSIS — E1165 Type 2 diabetes mellitus with hyperglycemia: Secondary | ICD-10-CM | POA: Diagnosis not present

## 2015-03-22 ENCOUNTER — Ambulatory Visit
Admission: RE | Admit: 2015-03-22 | Discharge: 2015-03-22 | Disposition: A | Discharge status: Home / Self Care | Payer: Medicare Other | Source: Ambulatory Visit | Attending: Radiation Oncology | Admitting: Radiation Oncology

## 2015-03-22 DIAGNOSIS — Z510 Encounter for antineoplastic radiation therapy: Secondary | ICD-10-CM | POA: Diagnosis not present

## 2015-03-22 DIAGNOSIS — C50411 Malignant neoplasm of upper-outer quadrant of right female breast: Secondary | ICD-10-CM | POA: Diagnosis not present

## 2015-03-23 ENCOUNTER — Ambulatory Visit: Payer: Medicare Other

## 2015-03-23 ENCOUNTER — Ambulatory Visit: Admission: RE | Admit: 2015-03-23 | Payer: Medicare Other | Source: Ambulatory Visit | Admitting: Radiation Oncology

## 2015-03-23 ENCOUNTER — Ambulatory Visit
Admission: RE | Admit: 2015-03-23 | Discharge: 2015-03-23 | Disposition: A | Discharge status: Home / Self Care | Payer: Medicare Other | Source: Ambulatory Visit | Attending: Radiation Oncology | Admitting: Radiation Oncology

## 2015-03-23 DIAGNOSIS — C50411 Malignant neoplasm of upper-outer quadrant of right female breast: Secondary | ICD-10-CM | POA: Diagnosis not present

## 2015-03-23 DIAGNOSIS — Z510 Encounter for antineoplastic radiation therapy: Secondary | ICD-10-CM | POA: Diagnosis not present

## 2015-03-24 ENCOUNTER — Ambulatory Visit
Admission: RE | Admit: 2015-03-24 | Discharge: 2015-03-24 | Disposition: A | Discharge status: Home / Self Care | Payer: Medicare Other | Source: Ambulatory Visit | Attending: Radiation Oncology | Admitting: Radiation Oncology

## 2015-03-24 DIAGNOSIS — C50411 Malignant neoplasm of upper-outer quadrant of right female breast: Secondary | ICD-10-CM | POA: Diagnosis not present

## 2015-03-24 DIAGNOSIS — Z510 Encounter for antineoplastic radiation therapy: Secondary | ICD-10-CM | POA: Diagnosis not present

## 2015-03-25 ENCOUNTER — Encounter: Payer: Self-pay | Admitting: Radiation Oncology

## 2015-03-25 ENCOUNTER — Ambulatory Visit
Admission: RE | Admit: 2015-03-25 | Discharge: 2015-03-25 | Disposition: A | Discharge status: Home / Self Care | Payer: Medicare Other | Source: Ambulatory Visit | Attending: Radiation Oncology | Admitting: Radiation Oncology

## 2015-03-25 VITALS — BP 119/76 | HR 78 | Temp 98.5°F | Resp 18 | Ht 69.5 in | Wt 305.1 lb

## 2015-03-25 DIAGNOSIS — C50411 Malignant neoplasm of upper-outer quadrant of right female breast: Secondary | ICD-10-CM | POA: Insufficient documentation

## 2015-03-25 DIAGNOSIS — Z510 Encounter for antineoplastic radiation therapy: Secondary | ICD-10-CM | POA: Diagnosis not present

## 2015-03-25 MED ORDER — RADIAPLEXRX EX GEL
Freq: Once | CUTANEOUS | Status: AC
  Administered 2015-03-25: 13:00:00 via TOPICAL

## 2015-03-25 NOTE — Progress Notes (Signed)
   Weekly Management Note:  Outpatient    ICD-9-CM ICD-10-CM   1. Breast cancer of upper-outer quadrant of right female breast (HCC) 174.4 C50.411 hyaluronate sodium (RADIAPLEXRX) gel    Current Dose:  7.2 Gy  Projected Dose: 60.4 Gy   Narrative:  The patient presents for routine under treatment assessment.  CBCT/MVCT images/Port film x-rays were reviewed.  The chart was checked. No new issues  Physical Findings:  height is 5' 9.5" (1.765 m) and weight is 305 lb 1.6 oz (138.392 kg). Her oral temperature is 98.5 F (36.9 C). Her blood pressure is 119/76 and her pulse is 78. Her respiration is 18.   Wt Readings from Last 3 Encounters:  03/25/15 305 lb 1.6 oz (138.392 kg)  12/14/14 305 lb 3.2 oz (138.438 kg)  12/08/14 309 lb 14.4 oz (140.57 kg)   Skin over right chest wall without sign of infection or drainage  Impression:  The patient is tolerating radiotherapy.  Plan:  Continue radiotherapy as planned.   ________________________________   Eppie Gibson, M.D.

## 2015-03-25 NOTE — Progress Notes (Signed)
Tonya Alvarez has completed 4 fractions to her right chest wall.  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  She reports having fatigue.  She reports she has already received education about side effects and has the Alra deoderant.  She was given another tube of radiaplex.  The skin on her right chest wall is pink.  BP 119/76 mmHg  Pulse 78  Temp(Src) 98.5 F (36.9 C) (Oral)  Resp 18  Ht 5' 9.5" (1.765 m)  Wt 305 lb 1.6 oz (138.392 kg)  BMI 44.42 kg/m2

## 2015-03-27 ENCOUNTER — Ambulatory Visit
Admission: RE | Admit: 2015-03-27 | Discharge: 2015-03-27 | Disposition: A | Discharge status: Home / Self Care | Payer: Medicare Other | Source: Ambulatory Visit | Attending: Radiation Oncology | Admitting: Radiation Oncology

## 2015-03-27 DIAGNOSIS — C50411 Malignant neoplasm of upper-outer quadrant of right female breast: Secondary | ICD-10-CM | POA: Diagnosis not present

## 2015-03-27 DIAGNOSIS — Z510 Encounter for antineoplastic radiation therapy: Secondary | ICD-10-CM | POA: Diagnosis not present

## 2015-03-28 ENCOUNTER — Encounter: Payer: Self-pay | Admitting: Radiation Oncology

## 2015-03-28 ENCOUNTER — Ambulatory Visit
Admission: RE | Admit: 2015-03-28 | Discharge: 2015-03-28 | Disposition: A | Discharge status: Home / Self Care | Payer: Medicare Other | Source: Ambulatory Visit | Attending: Radiation Oncology | Admitting: Radiation Oncology

## 2015-03-28 VITALS — BP 121/74 | HR 74 | Temp 98.3°F | Resp 12 | Wt 304.7 lb

## 2015-03-28 DIAGNOSIS — C50411 Malignant neoplasm of upper-outer quadrant of right female breast: Secondary | ICD-10-CM | POA: Diagnosis not present

## 2015-03-28 DIAGNOSIS — Z510 Encounter for antineoplastic radiation therapy: Secondary | ICD-10-CM | POA: Diagnosis not present

## 2015-03-28 NOTE — Progress Notes (Signed)
PAIN: She is currently in no pain.  SKIN: Pt right breast- warm dry and incision line is well approximated.  Pt denies edema.  Pt continues to apply Radiaplex as directed.  .BP 121/74 mmHg  Pulse 74  Temp(Src) 98.3 F (36.8 C) (Oral)  Resp 12  Wt 304 lb 11.2 oz (138.211 kg)  SpO2 97% Wt Readings from Last 3 Encounters:  03/28/15 304 lb 11.2 oz (138.211 kg)  03/25/15 305 lb 1.6 oz (138.392 kg)  12/14/14 305 lb 3.2 oz (138.438 kg)

## 2015-03-28 NOTE — Progress Notes (Signed)
   Weekly Management Note:  Outpatient    ICD-9-CM ICD-10-CM   1. Breast cancer of upper-outer quadrant of right female breast ([place]) 174.4 C50.411     Current Dose:  10.8 Gy  Projected Dose: 60.4 Gy   Narrative:  The patient presents for routine under treatment assessment.  CBCT/MVCT images/Port film x-rays were reviewed.  The chart was checked. No new issues  Physical Findings:  weight is 304 lb 11.2 oz (138.211 kg). Her oral temperature is 98.3 F (36.8 C). Her blood pressure is 121/74 and her pulse is 74. Her respiration is 12 and oxygen saturation is 97%.   Wt Readings from Last 3 Encounters:  03/28/15 304 lb 11.2 oz (138.211 kg)  03/25/15 305 lb 1.6 oz (138.392 kg)  12/14/14 305 lb 3.2 oz (138.438 kg)   Skin over right chest wall without sign of infection or drainage; there is slight erythema  Impression:  The patient is tolerating radiotherapy.  Plan:  Continue radiotherapy as planned.   ________________________________   Eppie Gibson, M.D.

## 2015-03-29 ENCOUNTER — Ambulatory Visit
Admission: RE | Admit: 2015-03-29 | Discharge: 2015-03-29 | Disposition: A | Discharge status: Home / Self Care | Payer: Medicare Other | Source: Ambulatory Visit | Attending: Radiation Oncology | Admitting: Radiation Oncology

## 2015-03-29 DIAGNOSIS — Z510 Encounter for antineoplastic radiation therapy: Secondary | ICD-10-CM | POA: Diagnosis not present

## 2015-03-29 DIAGNOSIS — C50411 Malignant neoplasm of upper-outer quadrant of right female breast: Secondary | ICD-10-CM | POA: Diagnosis not present

## 2015-03-30 ENCOUNTER — Ambulatory Visit
Admission: RE | Admit: 2015-03-30 | Discharge: 2015-03-30 | Disposition: A | Discharge status: Home / Self Care | Payer: Medicare Other | Source: Ambulatory Visit | Attending: Radiation Oncology | Admitting: Radiation Oncology

## 2015-03-30 DIAGNOSIS — C50411 Malignant neoplasm of upper-outer quadrant of right female breast: Secondary | ICD-10-CM | POA: Diagnosis not present

## 2015-03-30 DIAGNOSIS — Z510 Encounter for antineoplastic radiation therapy: Secondary | ICD-10-CM | POA: Diagnosis not present

## 2015-04-03 ENCOUNTER — Ambulatory Visit: Payer: Medicare Other

## 2015-04-04 ENCOUNTER — Ambulatory Visit
Admission: RE | Admit: 2015-04-04 | Discharge: 2015-04-04 | Disposition: A | Discharge status: Home / Self Care | Payer: Medicare Other | Source: Ambulatory Visit | Attending: Radiation Oncology | Admitting: Radiation Oncology

## 2015-04-04 ENCOUNTER — Encounter: Payer: Self-pay | Admitting: Radiation Oncology

## 2015-04-04 VITALS — BP 127/69 | HR 82 | Temp 98.6°F | Ht 69.5 in | Wt 306.3 lb

## 2015-04-04 DIAGNOSIS — C50411 Malignant neoplasm of upper-outer quadrant of right female breast: Secondary | ICD-10-CM | POA: Diagnosis not present

## 2015-04-04 DIAGNOSIS — Z510 Encounter for antineoplastic radiation therapy: Secondary | ICD-10-CM | POA: Diagnosis not present

## 2015-04-04 NOTE — Progress Notes (Signed)
   Weekly Management Note:  Outpatient    ICD-9-CM ICD-10-CM   1. Breast cancer of upper-outer quadrant of right female breast ([place]) 174.4 C50.411     Current Dose:  16.2 Gy  Projected Dose: 60.4 Gy   Narrative:  The patient presents for routine under treatment assessment.  CBCT/MVCT images/Port film x-rays were reviewed.  The chart was checked. Tired.  Physical Findings:  height is 5' 9.5" (1.765 m) and weight is 306 lb 4.8 oz (138.937 kg). Her temperature is 98.6 F (37 C). Her blood pressure is 127/69 and her pulse is 82.   Wt Readings from Last 3 Encounters:  04/04/15 306 lb 4.8 oz (138.937 kg)  03/28/15 304 lb 11.2 oz (138.211 kg)  03/25/15 305 lb 1.6 oz (138.392 kg)   Skin over right chest wall still without any sign of infection or drainage; there is still slight erythema  Impression:  The patient is tolerating radiotherapy.  Plan:  Continue radiotherapy as planned.   ________________________________   Eppie Gibson, M.D.

## 2015-04-04 NOTE — Progress Notes (Signed)
Tonya Alvarez is here for her 9th fraction of radiation to her Right Breast. She reports feeling tired after the Thanksgiving holiday and caring for her husband. Her anterior chest is slightly red, but she denies any tenderness, and she is using the radiaplex as directed.   BP 127/69 mmHg  Pulse 82  Temp(Src) 98.6 F (37 C)  Ht 5' 9.5" (1.765 m)  Wt 306 lb 4.8 oz (138.937 kg)  BMI 44.60 kg/m2   Wt Readings from Last 3 Encounters:  04/04/15 306 lb 4.8 oz (138.937 kg)  03/28/15 304 lb 11.2 oz (138.211 kg)  03/25/15 305 lb 1.6 oz (138.392 kg)

## 2015-04-05 ENCOUNTER — Ambulatory Visit
Admission: RE | Admit: 2015-04-05 | Discharge: 2015-04-05 | Disposition: A | Discharge status: Home / Self Care | Payer: Medicare Other | Source: Ambulatory Visit | Attending: Radiation Oncology | Admitting: Radiation Oncology

## 2015-04-05 DIAGNOSIS — C50411 Malignant neoplasm of upper-outer quadrant of right female breast: Secondary | ICD-10-CM | POA: Diagnosis not present

## 2015-04-05 DIAGNOSIS — Z510 Encounter for antineoplastic radiation therapy: Secondary | ICD-10-CM | POA: Diagnosis not present

## 2015-04-06 ENCOUNTER — Ambulatory Visit
Admission: RE | Admit: 2015-04-06 | Discharge: 2015-04-06 | Disposition: A | Discharge status: Home / Self Care | Payer: Medicare Other | Source: Ambulatory Visit | Attending: Radiation Oncology | Admitting: Radiation Oncology

## 2015-04-06 DIAGNOSIS — C50411 Malignant neoplasm of upper-outer quadrant of right female breast: Secondary | ICD-10-CM | POA: Diagnosis not present

## 2015-04-06 DIAGNOSIS — Z510 Encounter for antineoplastic radiation therapy: Secondary | ICD-10-CM | POA: Diagnosis not present

## 2015-04-07 ENCOUNTER — Ambulatory Visit
Admission: RE | Admit: 2015-04-07 | Discharge: 2015-04-07 | Disposition: A | Discharge status: Home / Self Care | Payer: Medicare Other | Source: Ambulatory Visit | Attending: Radiation Oncology | Admitting: Radiation Oncology

## 2015-04-07 DIAGNOSIS — C50411 Malignant neoplasm of upper-outer quadrant of right female breast: Secondary | ICD-10-CM | POA: Diagnosis not present

## 2015-04-07 DIAGNOSIS — Z510 Encounter for antineoplastic radiation therapy: Secondary | ICD-10-CM | POA: Diagnosis not present

## 2015-04-08 ENCOUNTER — Ambulatory Visit
Admission: RE | Admit: 2015-04-08 | Discharge: 2015-04-08 | Disposition: A | Discharge status: Home / Self Care | Payer: Medicare Other | Source: Ambulatory Visit | Attending: Radiation Oncology | Admitting: Radiation Oncology

## 2015-04-08 DIAGNOSIS — Z510 Encounter for antineoplastic radiation therapy: Secondary | ICD-10-CM | POA: Diagnosis not present

## 2015-04-08 DIAGNOSIS — C50411 Malignant neoplasm of upper-outer quadrant of right female breast: Secondary | ICD-10-CM | POA: Diagnosis not present

## 2015-04-11 ENCOUNTER — Ambulatory Visit
Admission: RE | Admit: 2015-04-11 | Discharge: 2015-04-11 | Disposition: A | Discharge status: Home / Self Care | Payer: Medicare Other | Source: Ambulatory Visit | Attending: Radiation Oncology | Admitting: Radiation Oncology

## 2015-04-11 ENCOUNTER — Encounter: Payer: Self-pay | Admitting: Radiation Oncology

## 2015-04-11 VITALS — BP 147/66 | HR 70 | Temp 98.3°F | Ht 69.5 in | Wt 309.9 lb

## 2015-04-11 DIAGNOSIS — C50911 Malignant neoplasm of unspecified site of right female breast: Secondary | ICD-10-CM | POA: Diagnosis not present

## 2015-04-11 DIAGNOSIS — C50411 Malignant neoplasm of upper-outer quadrant of right female breast: Secondary | ICD-10-CM

## 2015-04-11 DIAGNOSIS — Z510 Encounter for antineoplastic radiation therapy: Secondary | ICD-10-CM | POA: Diagnosis not present

## 2015-04-11 MED ORDER — RADIAPLEXRX EX GEL
Freq: Once | CUTANEOUS | Status: AC
  Administered 2015-04-11: 09:00:00 via TOPICAL

## 2015-04-11 NOTE — Progress Notes (Signed)
Ms. Breyette presents for her 14th fraction of radiation to her Right chest wall. She reports not sleeping well at night, and feels tired come afternoon. Her anterior Right Breast is red, and beginning to have some hyperpigmentation. She reports some itching at this site also. I will provide her with some more radiaplex cream which she is using twice daily.   BP 147/66 mmHg  Pulse 70  Temp(Src) 98.3 F (36.8 C)  Ht 5' 9.5" (1.765 m)  Wt 309 lb 14.4 oz (140.57 kg)  BMI 45.12 kg/m2   Wt Readings from Last 3 Encounters:  04/11/15 309 lb 14.4 oz (140.57 kg)  04/04/15 306 lb 4.8 oz (138.937 kg)  03/28/15 304 lb 11.2 oz (138.211 kg)

## 2015-04-11 NOTE — Progress Notes (Signed)
   Weekly Management Note:  Outpatient    ICD-9-CM ICD-10-CM   1. Breast cancer of upper-outer quadrant of right female breast (HCC) 174.4 C50.411 hyaluronate sodium (RADIAPLEXRX) gel    Current Dose:  25.2 Gy  Projected Dose: 60.4 Gy   Narrative:  The patient presents for routine under treatment assessment.  CBCT/MVCT images/Port film x-rays were reviewed.  The chart was checked. Tired. Not sleeping well. Skin a bit itchy over right chest wall  Physical Findings:  height is 5' 9.5" (1.765 m) and weight is 309 lb 14.4 oz (140.57 kg). Her temperature is 98.3 F (36.8 C). Her blood pressure is 147/66 and her pulse is 70.   Wt Readings from Last 3 Encounters:  04/11/15 309 lb 14.4 oz (140.57 kg)  04/04/15 306 lb 4.8 oz (138.937 kg)  03/28/15 304 lb 11.2 oz (138.211 kg)   Skin over right chest wall still without any sign of infection or drainage; there is moderate erythema, skin intact  Impression:  The patient is tolerating radiotherapy.  Plan:  Continue radiotherapy as planned. Consider melatonin for sleep, or low doses of benadryl to be used with caution. Neosporin if skin peels in future. hydrocortisone 1% cream prn itching.  ________________________________   Eppie Gibson, M.D.

## 2015-04-12 ENCOUNTER — Telehealth: Payer: Self-pay | Admitting: Hematology and Oncology

## 2015-04-12 ENCOUNTER — Encounter: Payer: Self-pay | Admitting: Hematology and Oncology

## 2015-04-12 ENCOUNTER — Ambulatory Visit (HOSPITAL_BASED_OUTPATIENT_CLINIC_OR_DEPARTMENT_OTHER): Payer: Medicare Other | Admitting: Hematology and Oncology

## 2015-04-12 ENCOUNTER — Ambulatory Visit
Admission: RE | Admit: 2015-04-12 | Discharge: 2015-04-12 | Disposition: A | Discharge status: Home / Self Care | Payer: Medicare Other | Source: Ambulatory Visit | Attending: Radiation Oncology | Admitting: Radiation Oncology

## 2015-04-12 VITALS — BP 124/66 | HR 77 | Temp 97.4°F | Resp 18 | Ht 69.5 in | Wt 304.0 lb

## 2015-04-12 DIAGNOSIS — Z170 Estrogen receptor positive status [ER+]: Secondary | ICD-10-CM | POA: Diagnosis not present

## 2015-04-12 DIAGNOSIS — Z79811 Long term (current) use of aromatase inhibitors: Secondary | ICD-10-CM

## 2015-04-12 DIAGNOSIS — Z510 Encounter for antineoplastic radiation therapy: Secondary | ICD-10-CM | POA: Diagnosis not present

## 2015-04-12 DIAGNOSIS — C773 Secondary and unspecified malignant neoplasm of axilla and upper limb lymph nodes: Secondary | ICD-10-CM

## 2015-04-12 DIAGNOSIS — C50411 Malignant neoplasm of upper-outer quadrant of right female breast: Secondary | ICD-10-CM

## 2015-04-12 DIAGNOSIS — C7951 Secondary malignant neoplasm of bone: Secondary | ICD-10-CM | POA: Diagnosis not present

## 2015-04-12 NOTE — Progress Notes (Signed)
Patient Care Team: Jacelyn Pi, MD as PCP - General (Endocrinology)  DIAGNOSIS: Breast cancer of upper-outer quadrant of right female breast [facility])   Staging form: Breast, AJCC 7th Edition     Clinical: Stage IIIA (T3, N1, M1) - Signed by Rulon Eisenmenger, MD on 03/05/2014     Pathologic: T3, N1a, M1 - Unsigned   SUMMARY OF ONCOLOGIC HISTORY:   Breast cancer of upper-outer quadrant of right female breast ([place])   02/02/2014 Mammogram Right breast: 5.8 cm irregular high-density solid mass suggestive of malignancy; left breast next millimeter internal mammary lymph node   02/04/2014 Initial Biopsy  2 biopsies were performed the right breast and axilla: Grade 2 invasive ductal carcinoma ER/PR positive HER-2 negative Ki-67 20% to 47%: Biopsy of right humerus metastatic breast cancer estrogen positive   02/17/2014 PET scan Hypermetabolic right breast cancer and right axillary lymph nodes subpectoral lymph nodes indeterminate right middle lobe pulmonary nodule and hypermetabolic proximal right humerus lesion   03/05/2014 - 07/19/2014 Neo-Adjuvant Chemotherapy Dose dense Adriamycin and Cytoxan x4 cycles followed by Abraxane weekly x12   07/26/2014 Breast MRI Multifocal breast cancer decreased in size, retroareolar 13 mm now 11 mm, middle third 22 mm now 18 mm, 3.9 x 3.9 cm now 3.9 x 2.6 cm, multiple smaller nodules decreased in size, right x-ray lymph node 3.6 cm now 2 cm other lymph nodes are smaller   07/28/2014 -  Anti-estrogen oral therapy Anastrozole 1 mg daily   08/02/2014 PET scan Interval improvement in the right breast mass and axillary/subpectoral lymph nodes and right humerus head. Several subpleural nodules are not visible, 3.4. cm adrenal adenoma   09/22/2014 Surgery Bilateral mastectomy: Right: IDC Grade 2, 2.7cm, LVI, PNI, 6/8 LN Positive, Er 90%, PR 5% T2N2   03/22/2015 -  Radiation Therapy adjuvant radiation therapy with Dr. Isidore Moos    CHIEF COMPLIANT: ongoing radiation  therapy  INTERVAL HISTORY: Tonya Alvarez is a 69 year old with above-mentioned history of stage IV breast cancer with humerus solitary metastases. She underwent neoadjuvant chemotherapy followed by bilateral mastectomies. She had a long time for wound healing and finally she started radiation therapy 03/22/2015. She was on anastrozole since March 2016. She has held anastrozole during radiation treatments. She does feel tired from radiation. With anastrozole she had hot flashes for which she was prescribed Neurontin which has been helping her tremendously.  REVIEW OF SYSTEMS:   Constitutional: Denies fevers, chills or abnormal weight loss Eyes: Denies blurriness of vision Ears, nose, mouth, throat, and face: Denies mucositis or sore throat Respiratory: Denies cough, dyspnea or wheezes Cardiovascular: Denies palpitation, chest discomfort or lower extremity swelling Gastrointestinal:  Denies nausea, heartburn or change in bowel habits Skin: Denies abnormal skin rashes Lymphatics: Denies new lymphadenopathy or easy bruising Neurological:Denies numbness, tingling or new weaknesses Behavioral/Psych: Mood is stable, no new changes  All other systems were reviewed with the patient and are negative.  I have reviewed the past medical history, past surgical history, social history and family history with the patient and they are unchanged from previous note.  ALLERGIES:  is allergic to amoxicillin; hydrocodone; and lisinopril.  MEDICATIONS:  Current Outpatient Prescriptions  Medication Sig Dispense Refill  . acetaminophen (TYLENOL) 500 MG tablet Take 500 mg by mouth every 6 (six) hours as needed.    Marland Kitchen anastrozole (ARIMIDEX) 1 MG tablet Take 1 tablet (1 mg total) by mouth daily. (Patient not taking: Reported on 03/25/2015) 90 tablet 3  . aspirin 81 MG tablet Take 81 mg  by mouth at bedtime.     . calcium-vitamin D (OSCAL WITH D) 500-200 MG-UNIT per tablet Take 1 tablet by mouth daily.    .  fluticasone (FLONASE) 50 MCG/ACT nasal spray Place 1 spray into both nostrils daily as needed for allergies or rhinitis. 16 g 0  . gabapentin (NEURONTIN) 300 MG capsule Take 300 mg by mouth 2 (two) times daily.    . hyaluronate sodium (RADIAPLEXRX) GEL Apply 1 application topically once.    Marland Kitchen ibuprofen (ADVIL,MOTRIN) 600 MG tablet Take 600 mg by mouth every 12 (twelve) hours.    . insulin lispro protamine-lispro (HUMALOG 75/25 MIX) (75-25) 100 UNIT/ML SUSP injection Inject 60-68 Units into the skin 2 (two) times daily with a meal. Inject 60-68 units into the skin 2 times daily, and 10 ml in the middle of the day    . lidocaine-prilocaine (EMLA) cream Apply 1 application topically as needed. 30 g 6  . losartan (COZAAR) 50 MG tablet Take 50 mg by mouth every morning.    . metFORMIN (GLUCOPHAGE) 500 MG tablet Take 1,000 mg by mouth 2 (two) times daily with a meal.     . Multiple Vitamin (MULTIVITAMIN WITH MINERALS) TABS tablet Take 1 tablet by mouth at bedtime.    . simvastatin (ZOCOR) 10 MG tablet Take 20 mg by mouth every evening.     Marland Kitchen tiZANidine (ZANAFLEX) 2 MG tablet Take 2 mg by mouth at bedtime as needed for muscle spasms. Pt takes this medication as needed     No current facility-administered medications for this visit.    PHYSICAL EXAMINATION: ECOG PERFORMANCE STATUS: 1 - Symptomatic but completely ambulatory  Filed Vitals:   04/12/15 0947  BP: 124/66  Pulse: 77  Temp: 97.4 F (36.3 C)  Resp: 18   Filed Weights   04/12/15 0947  Weight: 304 lb (137.893 kg)    GENERAL:alert, no distress and comfortable SKIN: skin color, texture, turgor are normal, no rashes or significant lesions EYES: normal, Conjunctiva are pink and non-injected, sclera clear OROPHARYNX:no exudate, no erythema and lips, buccal mucosa, and tongue normal  NECK: supple, thyroid normal size, non-tender, without nodularity LYMPH:  no palpable lymphadenopathy in the cervical, axillary or inguinal LUNGS: clear to  auscultation and percussion with normal breathing effort HEART: regular rate & rhythm and no murmurs and no lower extremity edema ABDOMEN:abdomen soft, non-tender and normal bowel sounds Musculoskeletal:no cyanosis of digits and no clubbing  NEURO: alert & oriented x 3 with fluent speech, no focal motor/sensory deficits  LABORATORY DATA:  I have reviewed the data as listed   Chemistry      Component Value Date/Time   NA 137 09/23/2014 0458   NA 142 07/19/2014 1005   K 4.3 09/23/2014 0458   K 4.1 07/19/2014 1005   CL 101 09/23/2014 0458   CO2 28 09/23/2014 0458   CO2 25 07/19/2014 1005   BUN 14 09/23/2014 0458   BUN 12.9 07/19/2014 1005   CREATININE 0.85 09/23/2014 0458   CREATININE 0.7 07/19/2014 1005      Component Value Date/Time   CALCIUM 8.9 09/23/2014 0458   CALCIUM 9.3 07/19/2014 1005   ALKPHOS 78 07/19/2014 1005   ALKPHOS 74 08/01/2010 1746   AST 15 07/19/2014 1005   AST 26 08/01/2010 1746   ALT 21 07/19/2014 1005   ALT 20 08/01/2010 1746   BILITOT 0.31 07/19/2014 1005   BILITOT 0.4 08/01/2010 1746       Lab Results  Component Value Date  WBC 8.1 09/23/2014   HGB 11.1* 09/23/2014   HCT 34.4* 09/23/2014   MCV 88.0 09/23/2014   PLT 242 09/23/2014   NEUTROABS 3.7 07/19/2014   ASSESSMENT & PLAN:  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 Followed by 12 cycles of weekly Abraxane completed 07/19/2014 S/P Mastectomy 09/22/14: Bilateral mastectomies: Right: IDC Grade 2, 2.7cm, LVI, PNI, 6/8 LN Positive, Er 90%, PR 5% T2N2 (Stage IIIA) Nonhealing right breast wound: finally healed  Anastrozole toxicities: Hot flashes for which she is prescribed gabapentin 300 twice a day Current treatment: 1. Adjuvant radiation therapy started 03/22/2015  to complete 05/11/2015 2. Once radiation is complete patient will resume antiestrogen therapy with anastrozole 1 mg daily  Patient's husband had completed her to neck radiation. We will obtain a PET CT scan to evaluate metastatic disease around April 1 in follow-up after that.  Return to clinic in Approximately 4 months for follow-up   Orders Placed This Encounter  Procedures  . NM PET Image Restag (PS) Skull Base To Thigh    Standing Status: Future     Number of Occurrences:      Standing Expiration Date: 04/11/2016    Order Specific Question:  Reason for Exam (SYMPTOM  OR DIAGNOSIS REQUIRED)    Answer:  Metastatic breast cancer restaging    Order Specific Question:  Preferred imaging location?    Answer:  [facility]    Order Specific Question:  If indicated for the ordered procedure, I authorize the administration of a radiopharmaceutical per Radiology protocol    Answer:  Yes   The patient has a good understanding of the overall plan. she agrees with it. she will call with any problems that may develop before the next visit here.   Rulon Eisenmenger, MD 04/12/2015

## 2015-04-12 NOTE — Telephone Encounter (Signed)
Gave patient avs report and appointments for December thru April. Port flush appointments added per patient. [facility] will call re pet - patient aware.

## 2015-04-12 NOTE — Addendum Note (Signed)
Addended by: Prentiss Bells on: 04/12/2015 12:03 PM   Modules accepted: Medications

## 2015-04-12 NOTE — Assessment & Plan Note (Signed)
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 Followed by 12 cycles of weekly Abraxane completed 07/19/2014  S/P Mastectomy 09/22/14: Bilateral mastectomies: Right: IDC Grade 2, 2.7cm, LVI, PNI, 6/8 LN Positive, Er 90%, PR 5% T2N2 (Stage IIIA) Nonhealing right breast wound: finally healed  Current treatment: 1. Adjuvant radiation therapy started 03/22/2015 2. Once radiation is complete patient will resume antiestrogen therapy  Patient's husband had completed her to neck radiation. Return to clinic in 4 months for follow-up

## 2015-04-13 ENCOUNTER — Ambulatory Visit
Admission: RE | Admit: 2015-04-13 | Discharge: 2015-04-13 | Disposition: A | Discharge status: Home / Self Care | Payer: Medicare Other | Source: Ambulatory Visit | Attending: Radiation Oncology | Admitting: Radiation Oncology

## 2015-04-13 DIAGNOSIS — Z510 Encounter for antineoplastic radiation therapy: Secondary | ICD-10-CM | POA: Diagnosis not present

## 2015-04-13 DIAGNOSIS — C50411 Malignant neoplasm of upper-outer quadrant of right female breast: Secondary | ICD-10-CM | POA: Diagnosis not present

## 2015-04-14 ENCOUNTER — Ambulatory Visit
Admission: RE | Admit: 2015-04-14 | Discharge: 2015-04-14 | Disposition: A | Discharge status: Home / Self Care | Payer: Medicare Other | Source: Ambulatory Visit | Attending: Radiation Oncology | Admitting: Radiation Oncology

## 2015-04-14 DIAGNOSIS — Z510 Encounter for antineoplastic radiation therapy: Secondary | ICD-10-CM | POA: Diagnosis not present

## 2015-04-14 DIAGNOSIS — C50411 Malignant neoplasm of upper-outer quadrant of right female breast: Secondary | ICD-10-CM | POA: Diagnosis not present

## 2015-04-15 ENCOUNTER — Ambulatory Visit
Admission: RE | Admit: 2015-04-15 | Discharge: 2015-04-15 | Disposition: A | Discharge status: Home / Self Care | Payer: Medicare Other | Source: Ambulatory Visit | Attending: Radiation Oncology | Admitting: Radiation Oncology

## 2015-04-15 DIAGNOSIS — Z510 Encounter for antineoplastic radiation therapy: Secondary | ICD-10-CM | POA: Diagnosis not present

## 2015-04-15 DIAGNOSIS — C50411 Malignant neoplasm of upper-outer quadrant of right female breast: Secondary | ICD-10-CM | POA: Diagnosis not present

## 2015-04-18 ENCOUNTER — Encounter: Payer: Self-pay | Admitting: Radiation Oncology

## 2015-04-18 ENCOUNTER — Ambulatory Visit
Admission: RE | Admit: 2015-04-18 | Discharge: 2015-04-18 | Disposition: A | Discharge status: Home / Self Care | Payer: Medicare Other | Source: Ambulatory Visit | Attending: Radiation Oncology | Admitting: Radiation Oncology

## 2015-04-18 VITALS — BP 131/61 | HR 72 | Temp 98.2°F | Ht 69.5 in | Wt 301.7 lb

## 2015-04-18 DIAGNOSIS — Z510 Encounter for antineoplastic radiation therapy: Secondary | ICD-10-CM | POA: Diagnosis not present

## 2015-04-18 DIAGNOSIS — C50411 Malignant neoplasm of upper-outer quadrant of right female breast: Secondary | ICD-10-CM | POA: Diagnosis not present

## 2015-04-18 NOTE — Progress Notes (Signed)
Tonya Alvarez presents for her 19th fraction of radiation to her Right Chest wall, nodes, Humerus. She reports some fatigue in the late afternoon. She has been having Right knee pain which she rates a 6/10, and is planning to have a steroid shot soon, she takes an occasional ibuprofen for this pain. The skin over her anterior chest is red, and she denies tenderness. There is an area of hyperpigmentation at her axilla, and there is a small area of peeling within the hyperpigmentation that has a small open area. She is using radiaplex cream to her chest, and I suggested using neosporin cream to the open area. She also reports occasional itching to her anterior breast.   BP 131/61 mmHg  Pulse 72  Temp(Src) 98.2 F (36.8 C)  Ht 5' 9.5" (1.765 m)  Wt 301 lb 11.2 oz (136.85 kg)  BMI 43.93 kg/m2

## 2015-04-18 NOTE — Progress Notes (Signed)
   Weekly Management Note:  Outpatient    ICD-9-CM ICD-10-CM   1. Breast cancer of upper-outer quadrant of right female breast ([place]) 174.4 C50.411     Current Dose:  34.2 Gy  Projected Dose: 60.4 Gy   Narrative:  The patient presents for routine under treatment assessment.  CBCT/MVCT images/Port film x-rays were reviewed.  The chart was checked.   Skin is still a bit itchy over right chest wall - no new complaints.  Physical Findings:  height is 5' 9.5" (1.765 m) and weight is 301 lb 11.2 oz (136.85 kg). Her temperature is 98.2 F (36.8 C). Her blood pressure is 131/61 and her pulse is 72.   Wt Readings from Last 3 Encounters:  04/18/15 301 lb 11.2 oz (136.85 kg)  04/12/15 304 lb (137.893 kg)  04/11/15 309 lb 14.4 oz (140.57 kg)   Skin over right chest and lower neck without any sign of infection or drainage; there is moderate erythema that has slightly increased from last week, skin intact  Impression:  The patient is tolerating radiotherapy.  Plan:  Continue radiotherapy as planned.  Neosporin if skin peels in future. hydrocortisone 1% cream prn itching.  ________________________________   Eppie Gibson, M.D.

## 2015-04-19 ENCOUNTER — Ambulatory Visit
Admission: RE | Admit: 2015-04-19 | Discharge: 2015-04-19 | Disposition: A | Discharge status: Home / Self Care | Payer: Medicare Other | Source: Ambulatory Visit | Attending: Radiation Oncology | Admitting: Radiation Oncology

## 2015-04-19 DIAGNOSIS — C50411 Malignant neoplasm of upper-outer quadrant of right female breast: Secondary | ICD-10-CM | POA: Diagnosis not present

## 2015-04-19 DIAGNOSIS — Z510 Encounter for antineoplastic radiation therapy: Secondary | ICD-10-CM | POA: Diagnosis not present

## 2015-04-20 ENCOUNTER — Ambulatory Visit
Admission: RE | Admit: 2015-04-20 | Discharge: 2015-04-20 | Disposition: A | Discharge status: Home / Self Care | Payer: Medicare Other | Source: Ambulatory Visit | Attending: Radiation Oncology | Admitting: Radiation Oncology

## 2015-04-20 DIAGNOSIS — Z510 Encounter for antineoplastic radiation therapy: Secondary | ICD-10-CM | POA: Diagnosis not present

## 2015-04-20 DIAGNOSIS — C50411 Malignant neoplasm of upper-outer quadrant of right female breast: Secondary | ICD-10-CM | POA: Diagnosis not present

## 2015-04-21 ENCOUNTER — Ambulatory Visit
Admission: RE | Admit: 2015-04-21 | Discharge: 2015-04-21 | Disposition: A | Discharge status: Home / Self Care | Payer: Medicare Other | Source: Ambulatory Visit | Attending: Radiation Oncology | Admitting: Radiation Oncology

## 2015-04-21 DIAGNOSIS — C50411 Malignant neoplasm of upper-outer quadrant of right female breast: Secondary | ICD-10-CM | POA: Diagnosis not present

## 2015-04-21 DIAGNOSIS — Z510 Encounter for antineoplastic radiation therapy: Secondary | ICD-10-CM | POA: Diagnosis not present

## 2015-04-22 ENCOUNTER — Ambulatory Visit
Admission: RE | Admit: 2015-04-22 | Discharge: 2015-04-22 | Disposition: A | Discharge status: Home / Self Care | Payer: Medicare Other | Source: Ambulatory Visit | Attending: Radiation Oncology | Admitting: Radiation Oncology

## 2015-04-22 ENCOUNTER — Ambulatory Visit (HOSPITAL_BASED_OUTPATIENT_CLINIC_OR_DEPARTMENT_OTHER): Payer: Medicare Other

## 2015-04-22 DIAGNOSIS — Z452 Encounter for adjustment and management of vascular access device: Secondary | ICD-10-CM | POA: Diagnosis not present

## 2015-04-22 DIAGNOSIS — Z95828 Presence of other vascular implants and grafts: Secondary | ICD-10-CM

## 2015-04-22 DIAGNOSIS — C50411 Malignant neoplasm of upper-outer quadrant of right female breast: Secondary | ICD-10-CM

## 2015-04-22 DIAGNOSIS — Z510 Encounter for antineoplastic radiation therapy: Secondary | ICD-10-CM | POA: Diagnosis not present

## 2015-04-22 MED ORDER — SODIUM CHLORIDE 0.9 % IJ SOLN
10.0000 mL | INTRAMUSCULAR | Status: DC | PRN
  Administered 2015-04-22: 10 mL via INTRAVENOUS
  Filled 2015-04-22: qty 10

## 2015-04-22 MED ORDER — HEPARIN SOD (PORK) LOCK FLUSH 100 UNIT/ML IV SOLN
500.0000 [IU] | Freq: Once | INTRAVENOUS | Status: AC
  Administered 2015-04-22: 500 [IU] via INTRAVENOUS
  Filled 2015-04-22: qty 5

## 2015-04-25 ENCOUNTER — Encounter: Payer: Self-pay | Admitting: Radiation Oncology

## 2015-04-25 ENCOUNTER — Ambulatory Visit
Admission: RE | Admit: 2015-04-25 | Discharge: 2015-04-25 | Disposition: A | Discharge status: Home / Self Care | Payer: Medicare Other | Source: Ambulatory Visit | Attending: Radiation Oncology | Admitting: Radiation Oncology

## 2015-04-25 ENCOUNTER — Ambulatory Visit: Admission: RE | Admit: 2015-04-25 | Payer: Medicare Other | Source: Ambulatory Visit | Admitting: Radiation Oncology

## 2015-04-25 VITALS — BP 137/65 | HR 74 | Temp 97.6°F | Ht 69.0 in | Wt 303.1 lb

## 2015-04-25 DIAGNOSIS — Z510 Encounter for antineoplastic radiation therapy: Secondary | ICD-10-CM | POA: Diagnosis not present

## 2015-04-25 DIAGNOSIS — C50411 Malignant neoplasm of upper-outer quadrant of right female breast: Secondary | ICD-10-CM | POA: Diagnosis not present

## 2015-04-25 DIAGNOSIS — M1711 Unilateral primary osteoarthritis, right knee: Secondary | ICD-10-CM | POA: Diagnosis not present

## 2015-04-25 DIAGNOSIS — M25552 Pain in left hip: Secondary | ICD-10-CM | POA: Diagnosis not present

## 2015-04-25 DIAGNOSIS — C50911 Malignant neoplasm of unspecified site of right female breast: Secondary | ICD-10-CM | POA: Diagnosis not present

## 2015-04-25 DIAGNOSIS — Z9012 Acquired absence of left breast and nipple: Secondary | ICD-10-CM | POA: Diagnosis not present

## 2015-04-25 DIAGNOSIS — Z96652 Presence of left artificial knee joint: Secondary | ICD-10-CM | POA: Diagnosis not present

## 2015-04-25 MED ORDER — SONAFINE EX EMUL
1.0000 "application " | Freq: Once | CUTANEOUS | Status: AC
  Administered 2015-04-25: 1 via TOPICAL
  Filled 2015-04-25: qty 45

## 2015-04-25 NOTE — Addendum Note (Signed)
Encounter addended by: Neysa Hotter, [facility] on: 04/25/2015  9:51 AM<BR>     Documentation filed: Rx Order Verification

## 2015-04-25 NOTE — Progress Notes (Signed)
Ms. Pera presents for her 24th fraction of radiation to her Right Chest wall. She admits to fatigue in the afternoon, and will nap occasionally. Her Right chest wall is red, tender and hyperpigmented. She has two areas of peeling. One at her axilla, which she is using neosporin to help heal. To her anterior breast she has a moist area at her surgery site and she is using neosporin to this area also. She complains of itching below her axilla wrapping from her back to her lateral chest. I will provide her with sonafine cream today to help relieve the itching.   BP 137/65 mmHg  Pulse 74  Temp(Src) 97.6 F (36.4 C)  Ht 5\' 9"  (1.753 m)  Wt 303 lb 1.6 oz (137.485 kg)  BMI 44.74 kg/m2

## 2015-04-25 NOTE — Progress Notes (Signed)
   Weekly Management Note:  Outpatient    ICD-9-CM ICD-10-CM   1. Breast cancer of upper-outer quadrant of right female breast (HCC) 174.4 C50.411 SONAFINE emulsion 1 application    Current Dose:  43.2 Gy  Projected Dose: 60.4 Gy   Narrative:  The patient presents for routine under treatment assessment.  CBCT/MVCT images/Port film x-rays were reviewed.  The chart was checked.   She admits to fatigue in the afternoon, and will nap occasionally. Her Right chest wall is red, tender and hyperpigmented. She has two areas of peeling. One at her axilla, which she is using neosporin to help heal. To her anterior breast she has a moist area at her surgery site and she is using neosporin to this area also. She complains of itching below her axilla wrapping from her back to her lateral chest.   Physical Findings:  height is 5\' 9"  (1.753 m) and weight is 303 lb 1.6 oz (137.485 kg). Her temperature is 97.6 F (36.4 C). Her blood pressure is 137/65 and her pulse is 74.   Wt Readings from Last 3 Encounters:  04/25/15 303 lb 1.6 oz (137.485 kg)  04/18/15 301 lb 11.2 oz (136.85 kg)  04/12/15 304 lb (137.893 kg)   Skin over right chest and lower neck with moderate erythema as well as 1.5cm fissures of moist peeling in skin folds of upper outer right chest and right chest wall scar  Impression:  The patient is tolerating radiotherapy.  Plan:  Continue radiotherapy as planned.  Neosporin over chest wall scar and any peeling. hydrocortisone 1% cream prn itching. Sonafine for dry areas instead of radiaplex.  ________________________________   Eppie Gibson, M.D.

## 2015-04-26 ENCOUNTER — Ambulatory Visit: Payer: Medicare Other | Admitting: Radiation Oncology

## 2015-04-26 ENCOUNTER — Ambulatory Visit
Admission: RE | Admit: 2015-04-26 | Discharge: 2015-04-26 | Disposition: A | Discharge status: Home / Self Care | Payer: Medicare Other | Source: Ambulatory Visit | Attending: Radiation Oncology | Admitting: Radiation Oncology

## 2015-04-26 DIAGNOSIS — C50411 Malignant neoplasm of upper-outer quadrant of right female breast: Secondary | ICD-10-CM | POA: Diagnosis not present

## 2015-04-26 DIAGNOSIS — C50911 Malignant neoplasm of unspecified site of right female breast: Secondary | ICD-10-CM | POA: Diagnosis not present

## 2015-04-26 DIAGNOSIS — Z9012 Acquired absence of left breast and nipple: Secondary | ICD-10-CM | POA: Diagnosis not present

## 2015-04-26 DIAGNOSIS — Z510 Encounter for antineoplastic radiation therapy: Secondary | ICD-10-CM | POA: Diagnosis not present

## 2015-04-27 ENCOUNTER — Ambulatory Visit
Admission: RE | Admit: 2015-04-27 | Discharge: 2015-04-27 | Disposition: A | Discharge status: Home / Self Care | Payer: Medicare Other | Source: Ambulatory Visit | Attending: Radiation Oncology | Admitting: Radiation Oncology

## 2015-04-27 DIAGNOSIS — C50411 Malignant neoplasm of upper-outer quadrant of right female breast: Secondary | ICD-10-CM | POA: Diagnosis not present

## 2015-04-27 DIAGNOSIS — Z510 Encounter for antineoplastic radiation therapy: Secondary | ICD-10-CM | POA: Diagnosis not present

## 2015-04-27 DIAGNOSIS — C50911 Malignant neoplasm of unspecified site of right female breast: Secondary | ICD-10-CM | POA: Diagnosis not present

## 2015-04-27 DIAGNOSIS — Z9012 Acquired absence of left breast and nipple: Secondary | ICD-10-CM | POA: Diagnosis not present

## 2015-04-28 ENCOUNTER — Ambulatory Visit
Admission: RE | Admit: 2015-04-28 | Discharge: 2015-04-28 | Disposition: A | Discharge status: Home / Self Care | Payer: Medicare Other | Source: Ambulatory Visit | Attending: Radiation Oncology | Admitting: Radiation Oncology

## 2015-04-28 DIAGNOSIS — Z510 Encounter for antineoplastic radiation therapy: Secondary | ICD-10-CM | POA: Diagnosis not present

## 2015-04-28 DIAGNOSIS — C50411 Malignant neoplasm of upper-outer quadrant of right female breast: Secondary | ICD-10-CM | POA: Diagnosis not present

## 2015-04-29 ENCOUNTER — Ambulatory Visit
Admission: RE | Admit: 2015-04-29 | Discharge: 2015-04-29 | Disposition: A | Discharge status: Home / Self Care | Payer: Medicare Other | Source: Ambulatory Visit | Attending: Radiation Oncology | Admitting: Radiation Oncology

## 2015-04-29 ENCOUNTER — Encounter: Payer: Self-pay | Admitting: Radiation Oncology

## 2015-04-29 VITALS — BP 137/71 | HR 67 | Temp 97.5°F | Ht 69.0 in | Wt 301.9 lb

## 2015-04-29 DIAGNOSIS — C50411 Malignant neoplasm of upper-outer quadrant of right female breast: Secondary | ICD-10-CM | POA: Diagnosis not present

## 2015-04-29 DIAGNOSIS — Z510 Encounter for antineoplastic radiation therapy: Secondary | ICD-10-CM | POA: Diagnosis not present

## 2015-04-29 MED ORDER — SONAFINE EX EMUL
1.0000 "application " | Freq: Once | CUTANEOUS | Status: AC
  Administered 2015-04-29: 1 via TOPICAL
  Filled 2015-04-29: qty 45

## 2015-04-29 NOTE — Progress Notes (Signed)
Department of Radiation Oncology  Phone:  [phone] Fax:        [phone]  Weekly Treatment Note    Name: Tonya Alvarez Date: 04/29/2015 MRN: [id number] DOB: 08/05/1945   Diagnosis:     ICD-9-CM ICD-10-CM   1. Breast cancer of upper-outer quadrant of right female breast (HCC) 174.4 C50.411 SONAFINE emulsion 1 application     Current dose: 50.4 Gy  Current fraction: 28   MEDICATIONS: Current Outpatient Prescriptions  Medication Sig Dispense Refill  . acetaminophen (TYLENOL) 500 MG tablet Take 500 mg by mouth every 6 (six) hours as needed.    Marland Kitchen aspirin 81 MG tablet Take 81 mg by mouth at bedtime.     . calcium-vitamin D (OSCAL WITH D) 500-200 MG-UNIT per tablet Take 1 tablet by mouth daily.    . fluticasone (FLONASE) 50 MCG/ACT nasal spray Place 1 spray into both nostrils daily as needed for allergies or rhinitis. 16 g 0  . gabapentin (NEURONTIN) 300 MG capsule Take 300 mg by mouth 2 (two) times daily.    . hyaluronate sodium (RADIAPLEXRX) GEL Apply 1 application topically once.    Marland Kitchen ibuprofen (ADVIL,MOTRIN) 600 MG tablet Take 600 mg by mouth every 12 (twelve) hours.    . insulin lispro protamine-lispro (HUMALOG 75/25 MIX) (75-25) 100 UNIT/ML SUSP injection Inject 60-68 Units into the skin 2 (two) times daily with a meal. Inject 60-68 units into the skin 2 times daily, and 10 ml in the middle of the day    . lidocaine-prilocaine (EMLA) cream Apply 1 application topically as needed. 30 g 6  . losartan (COZAAR) 50 MG tablet Take 50 mg by mouth every morning.    . metFORMIN (GLUCOPHAGE) 500 MG tablet Take 1,000 mg by mouth 2 (two) times daily with a meal.     . Multiple Vitamin (MULTIVITAMIN WITH MINERALS) TABS tablet Take 1 tablet by mouth at bedtime.    . simvastatin (ZOCOR) 10 MG tablet Take 20 mg by mouth every evening.     Marland Kitchen tiZANidine (ZANAFLEX) 2 MG tablet Take 2 mg by mouth at bedtime as needed for muscle spasms. Pt takes this medication as needed    . anastrozole  (ARIMIDEX) 1 MG tablet Take 1 tablet (1 mg total) by mouth daily. (Patient not taking: Reported on 03/25/2015) 90 tablet 3   No current facility-administered medications for this encounter.     ALLERGIES: Amoxicillin; Hydrocodone; and Lisinopril   LABORATORY DATA:  Lab Results  Component Value Date   WBC 8.1 09/23/2014   HGB 11.1* 09/23/2014   HCT 34.4* 09/23/2014   MCV 88.0 09/23/2014   PLT 242 09/23/2014   Lab Results  Component Value Date   NA 137 09/23/2014   K 4.3 09/23/2014   CL 101 09/23/2014   CO2 28 09/23/2014   Lab Results  Component Value Date   ALT 21 07/19/2014   AST 15 07/19/2014   ALKPHOS 78 07/19/2014   BILITOT 0.31 07/19/2014     NARRATIVE: Tonya Alvarez was seen today for weekly treatment management. The chart was checked and the patient's films were reviewed.  Tonya Alvarez is here for her 28th fraction of radiation to her right chest wall. She denies any fatigue more than usual at this time. She rests in the late afternoon most days. Her right chest is red, hyperpigmented. She has an area of peeling in her axilla and she is applying neosporin to this area. She has another to her incision area, which  she is also using neosporin. She has an area of increased redness over her right clavicle area which itches. She also has some mild redness to her right scapula area which also itches. She is using sonafine cream to these areas, and the nurse will supply her with another tube today.   PHYSICAL EXAMINATION: height is 5\' 9"  (1.753 m) and weight is 301 lb 14.4 oz (136.941 kg). Her temperature is 97.5 F (36.4 C). Her blood pressure is 137/71 and her pulse is 67.  Diffuse hyperpigmentation and eyrethema. Small area of moist desquamation in the lateral aspect of the incision.  ASSESSMENT: The patient is doing satisfactorily with treatment.  PLAN: We will continue with the patient's radiation treatment as planned. The patient will continue her current skin care and  will continue to apply neosporin to these areas.

## 2015-04-29 NOTE — Progress Notes (Signed)
Ms. Roelle is here for her 28th fraction of radiation to her Right Chest wall. She denies any fatigue more than usual at this time. She rests in the late afternoon most days. Her Right Chest is red, hyperpigmented. She has an area of peeling to her axilla and she is using neosporin to this area. She has another to her incision area, which she is also using neosporin. She has an area of increased redness over her Right clavicle area which itches. She also has some mild redness to her Right Scapula area which also itches. She is using sonafine cream to these areas, and I will supply her with another tube today.   BP 137/71 mmHg  Pulse 67  Temp(Src) 97.5 F (36.4 C)  Ht 5\' 9"  (1.753 m)  Wt 301 lb 14.4 oz (136.941 kg)  BMI 44.56 kg/m2

## 2015-05-03 ENCOUNTER — Ambulatory Visit
Admission: RE | Admit: 2015-05-03 | Discharge: 2015-05-03 | Disposition: A | Discharge status: Home / Self Care | Payer: Medicare Other | Source: Ambulatory Visit | Attending: Radiation Oncology | Admitting: Radiation Oncology

## 2015-05-03 ENCOUNTER — Ambulatory Visit: Payer: Medicare Other

## 2015-05-03 DIAGNOSIS — C50411 Malignant neoplasm of upper-outer quadrant of right female breast: Secondary | ICD-10-CM | POA: Diagnosis not present

## 2015-05-03 DIAGNOSIS — Z510 Encounter for antineoplastic radiation therapy: Secondary | ICD-10-CM | POA: Diagnosis not present

## 2015-05-04 ENCOUNTER — Ambulatory Visit
Admission: RE | Admit: 2015-05-04 | Discharge: 2015-05-04 | Disposition: A | Discharge status: Home / Self Care | Payer: Medicare Other | Source: Ambulatory Visit | Attending: Radiation Oncology | Admitting: Radiation Oncology

## 2015-05-04 ENCOUNTER — Encounter: Payer: Self-pay | Admitting: Radiation Oncology

## 2015-05-04 ENCOUNTER — Ambulatory Visit: Payer: Medicare Other

## 2015-05-04 VITALS — BP 99/76 | HR 83 | Temp 98.3°F | Ht 69.0 in | Wt 301.0 lb

## 2015-05-04 DIAGNOSIS — Z510 Encounter for antineoplastic radiation therapy: Secondary | ICD-10-CM | POA: Diagnosis not present

## 2015-05-04 DIAGNOSIS — C50411 Malignant neoplasm of upper-outer quadrant of right female breast: Secondary | ICD-10-CM

## 2015-05-04 NOTE — Progress Notes (Signed)
Ms. Bough presents for her 30th fraction of radiation to her Right Chest wall. She denies fatigue. Her Right Chest is red, hyperpigmented. She has several areas of peeling skin. Her Right clavicle has a new area of peeling with a open area. The area of peeling at her axilla is larger and extends toward her back, and Chest, and this area also has a large open area of desquamation. Her central breast has several areas of moist desquamation. She is using neosporin and sonafine to all these areas. The entire Chest wall is tender to touch. She also has some hyperpigmentation which she reports is itchy at times.  BP 99/76 mmHg  Pulse 83  Temp(Src) 98.3 F (36.8 C)  Ht 5\' 9"  (1.753 m)  Wt 301 lb (136.533 kg)  BMI 44.43 kg/m2

## 2015-05-04 NOTE — Progress Notes (Signed)
   Weekly Management Note:  Outpatient    ICD-9-CM ICD-10-CM   1. Breast cancer of upper-outer quadrant of right female breast (HCC) 174.4 C50.411     Current Dose: 54.4 Gy  Projected Dose: 60.4 Gy   Narrative:  The patient presents for routine under treatment assessment.  CBCT/MVCT images/Port film x-rays were reviewed.  The chart was checked.   Moist desquamation has developed  Physical Findings:  height is 5\' 9"  (1.753 m) and weight is 301 lb (136.533 kg). Her temperature is 98.3 F (36.8 C). Her blood pressure is 99/76 and her pulse is 83.   Wt Readings from Last 3 Encounters:  05/04/15 301 lb (136.533 kg)  04/29/15 301 lb 14.4 oz (136.941 kg)  04/25/15 303 lb 1.6 oz (137.485 kg)   moist desquamation in right axilla and skin folds of right chest wall. No sign of infection Dry erythematous skin over upper right back.  Impression:  The patient is tolerating radiotherapy.  Plan:  Continue radiotherapy as planned.  Neosporin over  Moist desquamation. Sonafine for dry areas instead of radiaplex.   ________________________________   Eppie Gibson, M.D.

## 2015-05-05 ENCOUNTER — Ambulatory Visit
Admission: RE | Admit: 2015-05-05 | Discharge: 2015-05-05 | Disposition: A | Discharge status: Home / Self Care | Payer: Medicare Other | Source: Ambulatory Visit | Attending: Radiation Oncology | Admitting: Radiation Oncology

## 2015-05-05 ENCOUNTER — Other Ambulatory Visit: Payer: Self-pay | Admitting: Hematology and Oncology

## 2015-05-05 DIAGNOSIS — Z510 Encounter for antineoplastic radiation therapy: Secondary | ICD-10-CM | POA: Diagnosis not present

## 2015-05-05 DIAGNOSIS — C50411 Malignant neoplasm of upper-outer quadrant of right female breast: Secondary | ICD-10-CM | POA: Diagnosis not present

## 2015-05-06 ENCOUNTER — Ambulatory Visit
Admission: RE | Admit: 2015-05-06 | Discharge: 2015-05-06 | Disposition: A | Discharge status: Home / Self Care | Payer: Medicare Other | Source: Ambulatory Visit | Attending: Radiation Oncology | Admitting: Radiation Oncology

## 2015-05-06 DIAGNOSIS — Z510 Encounter for antineoplastic radiation therapy: Secondary | ICD-10-CM | POA: Diagnosis not present

## 2015-05-06 DIAGNOSIS — C50411 Malignant neoplasm of upper-outer quadrant of right female breast: Secondary | ICD-10-CM | POA: Diagnosis not present

## 2015-05-10 ENCOUNTER — Encounter: Payer: Self-pay | Admitting: Radiation Oncology

## 2015-05-10 ENCOUNTER — Ambulatory Visit: Payer: Medicare Other

## 2015-05-10 ENCOUNTER — Ambulatory Visit
Admission: RE | Admit: 2015-05-10 | Discharge: 2015-05-10 | Disposition: A | Discharge status: Home / Self Care | Payer: Medicare Other | Source: Ambulatory Visit | Attending: Radiation Oncology | Admitting: Radiation Oncology

## 2015-05-10 VITALS — BP 129/75 | HR 70 | Temp 98.2°F | Ht 69.0 in | Wt 304.2 lb

## 2015-05-10 DIAGNOSIS — Z510 Encounter for antineoplastic radiation therapy: Secondary | ICD-10-CM | POA: Insufficient documentation

## 2015-05-10 DIAGNOSIS — C50411 Malignant neoplasm of upper-outer quadrant of right female breast: Secondary | ICD-10-CM | POA: Insufficient documentation

## 2015-05-10 MED ORDER — SONAFINE EX EMUL
1.0000 "application " | Freq: Once | CUTANEOUS | Status: AC
  Administered 2015-05-10: 1 via TOPICAL
  Filled 2015-05-10: qty 45

## 2015-05-10 NOTE — Progress Notes (Signed)
  Radiation Oncology         [phone] ________________________________  Name: Tonya Alvarez MRN: [id number]  Date: 05/10/2015  DOB: 06-11-1945  Weekly Radiation Therapy Management  Current Dose: 60.4 Gy     Planned Dose:  60.4 Gy  Narrative . . . . . . . . The patient presents for routine under treatment assessment.                                   The patient is without complaint. Happy to complete her radiation therapy                                 Set-up films were reviewed.                                 The chart was checked. Physical Findings. . .  height is 5\' 9"  (1.753 m) and weight is 304 lb 3.2 oz (137.984 kg). Her temperature is 98.2 F (36.8 C). Her blood pressure is 129/75 and her pulse is 70. . The lungs are clear. The heart has a regular rhythm and rate. The right chest wall area shows erythema and dry desquamation. Some areas of moist desquamation the patient is using Neosporin Impression . . . . . . . The patient is tolerating radiation. Plan . . . . . . . . . . . . Follow-up appointment given for the patient  ________________________________   Blair Promise, PhD, MD

## 2015-05-10 NOTE — Progress Notes (Signed)
Ms. Guarnera is her for her last fraction of radiation to her Right Chest wall. She denies any fatigue at this time. Her Right Chest wall is tender and red. Her axilla has dry peeling areas, with no open areas noted. She also has several areas of moist peeling at her surgery site. She is using neosporin at her axilla and surgery site. She has no other concerns at this time.   BP 129/75 mmHg  Pulse 70  Temp(Src) 98.2 F (36.8 C)  Ht 5\' 9"  (1.753 m)  Wt 304 lb 3.2 oz (137.984 kg)  BMI 44.90 kg/m2

## 2015-05-11 ENCOUNTER — Ambulatory Visit: Payer: Medicare Other

## 2015-05-13 DIAGNOSIS — C50911 Malignant neoplasm of unspecified site of right female breast: Secondary | ICD-10-CM | POA: Diagnosis not present

## 2015-05-22 NOTE — Progress Notes (Addendum)
  Radiation Oncology         [phone] ________________________________  Name: ELLIE GOODFELLOW MRN: [id number]  Date: 05/10/2015  DOB: 11-06-1945  End of Treatment Note  DIAGNOSIS:    ICD-9-CM ICD-10-CM   1. 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   Site/dose:  Site/dose:    1) Right Chest Wall / 50.4 Gy in 28 fractions 2) Right humeral head, axilla, supraclavicular fossa/ 46.8 Gy in 26 fractions 3) Right Chest Wall Scar boost / 10 Gy in 5 fractions  Beams/energy:   ] 1) 3D Opposed tangents / 15, 10 and 6 MV photons 2) 3D conformal / 15 and 10 MV photons 3) Electrons / 6 MeV photons  Narrative: The patient tolerated radiation treatment relatively well.     Plan: The patient has completed radiation treatment. The patient will return to radiation oncology clinic for routine followup in one month. I advised them to call or return sooner if they have any questions or concerns related to their recovery or treatment.  -----------------------------------  Eppie Gibson, MD

## 2015-05-24 ENCOUNTER — Other Ambulatory Visit: Payer: Self-pay | Admitting: Hematology and Oncology

## 2015-05-24 DIAGNOSIS — C50411 Malignant neoplasm of upper-outer quadrant of right female breast: Secondary | ICD-10-CM

## 2015-06-06 NOTE — Progress Notes (Signed)
[employer] representative Note 04-25-15 Diagnosis: Breast Cancer   The patient's CT images from her initial simulation were reviewed to plan her boost treatment to her right chest wall scar.  Measurements were made regarding the size and depth of the surgical bed. The boost  will be delivered with 6 MeV electrons; 10 Gy in 5 fractions has been prescribed to the 90% isodose line.   A special port plan plan was reviewed and approved.  A custom electron cut-out will be used for her boost field.    -----------------------------------  Eppie Gibson, MD

## 2015-06-10 ENCOUNTER — Encounter: Payer: Self-pay | Admitting: Radiation Oncology

## 2015-06-10 ENCOUNTER — Ambulatory Visit
Admission: RE | Admit: 2015-06-10 | Discharge: 2015-06-10 | Disposition: A | Discharge status: Home / Self Care | Payer: Medicare Other | Source: Ambulatory Visit | Attending: Radiation Oncology | Admitting: Radiation Oncology

## 2015-06-10 VITALS — BP 143/63 | HR 66 | Temp 98.5°F | Ht 69.0 in | Wt 311.5 lb

## 2015-06-10 DIAGNOSIS — C50411 Malignant neoplasm of upper-outer quadrant of right female breast: Secondary | ICD-10-CM

## 2015-06-10 NOTE — Progress Notes (Signed)
Tonya Alvarez is here for follow up of radiation completed 05/10/15 to her Right Breast. She denies any pain related to the radiation, though she does complain of pain to her Right Knee. She denies any fatigue. The skin over her Right Breast has improved, but she reports some mild hyperpigmentation. She had some fluid removed 3 weeks ago by Dr. Ninfa Linden in her Left Breast, and will return to follow up with him 06/17/15. She has a PET scan scheduled in April.   BP 143/63 mmHg  Pulse 66  Temp(Src) 98.5 F (36.9 C)  Ht 5\' 9"  (1.753 m)  Wt 311 lb 8 oz (141.295 kg)  BMI 45.98 kg/m2   Wt Readings from Last 3 Encounters:  06/10/15 311 lb 8 oz (141.295 kg)  05/10/15 304 lb 3.2 oz (137.984 kg)  05/04/15 301 lb (136.533 kg)

## 2015-06-10 NOTE — Progress Notes (Signed)
Radiation Oncology         (336) [phone] ________________________________  Name: Tonya Alvarez MRN: [id number]  Date: 06/10/2015  DOB: 1945/10/18  Follow-Up Visit Note  Outpatient  CC: Jacelyn Pi, MD  Nicholas Lose, MD  Diagnosis:    ICD-9-CM ICD-10-CM   1. 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  Radiation treatment dates:   03/22/2015-05/10/2015. Site/dose: 1) Right Chest Wall / 50.4 Gy in 28 fractions 2) Right humeral head, axilla, supraclavicular fossa/ 46.8 Gy in 26 fractions 3) Right Chest Wall Scar boost / 10 Gy in 5 fractions  Narrative:  The patient returns today for routine follow-up. She denies any pain related to the radiation, though she does complain of chronic pain to her right knee. She denies shoulder pain. She denies any fatigue. The skin over her right chestwall has improved, but she reports some mild hyperpigmentation. She had some fluid removed 3 weeks ago by Dr. Ninfa Linden in her left chestwall                              ALLERGIES:  is allergic to amoxicillin; hydrocodone; and lisinopril.  Meds: Current Outpatient Prescriptions  Medication Sig Dispense Refill  . acetaminophen (TYLENOL) 500 MG tablet Take 500 mg by mouth every 6 (six) hours as needed.    Marland Kitchen anastrozole (ARIMIDEX) 1 MG tablet Take 1 tablet by mouth  daily 90 tablet 3  . aspirin 81 MG tablet Take 81 mg by mouth at bedtime.     . calcium-vitamin D (OSCAL WITH D) 500-200 MG-UNIT per tablet Take 1 tablet by mouth daily.    . fluticasone (FLONASE) 50 MCG/ACT nasal spray Place 1 spray into both nostrils daily as needed for allergies or rhinitis. 16 g 0  . gabapentin (NEURONTIN) 300 MG capsule Take 300 mg by mouth 2 (two) times daily.    Marland Kitchen ibuprofen (ADVIL,MOTRIN) 600 MG tablet Take 600 mg by mouth every 12 (twelve) hours.    . insulin lispro protamine-lispro (HUMALOG 75/25 MIX)  (75-25) 100 UNIT/ML SUSP injection Inject 60-68 Units into the skin 2 (two) times daily with a meal. Inject 60-68 units into the skin 2 times daily, and 10 ml in the middle of the day    . lidocaine-prilocaine (EMLA) cream Apply 1 application topically as needed. 30 g 6  . losartan (COZAAR) 50 MG tablet Take 50 mg by mouth every morning.    . metFORMIN (GLUCOPHAGE) 500 MG tablet Take 1,000 mg by mouth 2 (two) times daily with a meal.     . Multiple Vitamin (MULTIVITAMIN WITH MINERALS) TABS tablet Take 1 tablet by mouth at bedtime.    . simvastatin (ZOCOR) 10 MG tablet Take 20 mg by mouth every evening.     . hyaluronate sodium (RADIAPLEXRX) GEL Apply 1 application topically once. Reported on 06/10/2015    . tiZANidine (ZANAFLEX) 2 MG tablet Take 2 mg by mouth at bedtime as needed for muscle spasms. Reported on 06/10/2015     No current facility-administered medications for this encounter.    Physical Findings: The patient is in no acute distress. Patient is alert and oriented.  height is 5\' 9"  (1.753 m) and weight is 311 lb 8 oz (141.295 kg). Her temperature is 98.5 F (36.9 C). Her blood pressure is 143/63 and her pulse is 66. Marland Kitchen General:  Alert and oriented, in no acute distress Neck: Neck is supple, no palpable cervical or supraclavicular lymphadenopathy. No palpable masses in the upper or lower neck.  Slightly hyperpigmented skin over right axilla and right chest, overall it is healed well. No palpable masses over chest bilaterally to suggest recurrence.   Lab Findings: Lab Results  Component Value Date   WBC 8.1 09/23/2014   HGB 11.1* 09/23/2014   HCT 34.4* 09/23/2014   MCV 88.0 09/23/2014   PLT 242 09/23/2014    Radiographic Findings: No results found.  Impression/Plan:  Healed well from RT  She has a follow up appointment with Dr. Ninfa Linden 06/17/2015. She has a PET scan scheduled in April.  I encouraged her to continue  followup with medical oncology. I will see her back on an  as-needed basis. I have encouraged her to call if she has any issues or concerns in the future. I wished her the very best.  _____________________________________   Eppie Gibson, MD    This document serves as a record of services personally performed by Eppie Gibson, MD. It was created on her behalf by Lendon Collar, a trained medical scribe. The creation of this record is based on the scribe's personal observations and the provider's statements to them. This document has been checked and approved by the attending provider.

## 2015-06-17 ENCOUNTER — Ambulatory Visit (HOSPITAL_BASED_OUTPATIENT_CLINIC_OR_DEPARTMENT_OTHER): Payer: Medicare Other

## 2015-06-17 DIAGNOSIS — C50411 Malignant neoplasm of upper-outer quadrant of right female breast: Secondary | ICD-10-CM

## 2015-06-17 DIAGNOSIS — L7622 Postprocedural hemorrhage and hematoma of skin and subcutaneous tissue following other procedure: Secondary | ICD-10-CM | POA: Diagnosis not present

## 2015-06-17 DIAGNOSIS — Z452 Encounter for adjustment and management of vascular access device: Secondary | ICD-10-CM | POA: Diagnosis not present

## 2015-06-17 DIAGNOSIS — C50919 Malignant neoplasm of unspecified site of unspecified female breast: Secondary | ICD-10-CM

## 2015-06-17 MED ORDER — HEPARIN SOD (PORK) LOCK FLUSH 100 UNIT/ML IV SOLN
500.0000 [IU] | Freq: Once | INTRAVENOUS | Status: AC
  Administered 2015-06-17: 500 [IU] via INTRAVENOUS
  Filled 2015-06-17: qty 5

## 2015-06-17 MED ORDER — SODIUM CHLORIDE 0.9% FLUSH
10.0000 mL | INTRAVENOUS | Status: DC | PRN
  Administered 2015-06-17: 10 mL via INTRAVENOUS
  Filled 2015-06-17: qty 10

## 2015-06-17 NOTE — Patient Instructions (Signed)

## 2015-07-05 DIAGNOSIS — H40013 Open angle with borderline findings, low risk, bilateral: Secondary | ICD-10-CM | POA: Diagnosis not present

## 2015-07-05 DIAGNOSIS — E119 Type 2 diabetes mellitus without complications: Secondary | ICD-10-CM | POA: Diagnosis not present

## 2015-07-05 DIAGNOSIS — H43813 Vitreous degeneration, bilateral: Secondary | ICD-10-CM | POA: Diagnosis not present

## 2015-07-28 DIAGNOSIS — M1711 Unilateral primary osteoarthritis, right knee: Secondary | ICD-10-CM | POA: Diagnosis not present

## 2015-08-08 ENCOUNTER — Ambulatory Visit (HOSPITAL_COMMUNITY): Payer: Medicare Other

## 2015-08-08 ENCOUNTER — Encounter (HOSPITAL_COMMUNITY)
Admission: RE | Admit: 2015-08-08 | Discharge: 2015-08-08 | Disposition: A | Discharge status: Home / Self Care | Payer: Medicare Other | Source: Ambulatory Visit | Attending: Hematology and Oncology | Admitting: Hematology and Oncology

## 2015-08-08 DIAGNOSIS — C7951 Secondary malignant neoplasm of bone: Secondary | ICD-10-CM | POA: Diagnosis not present

## 2015-08-08 DIAGNOSIS — C50919 Malignant neoplasm of unspecified site of unspecified female breast: Secondary | ICD-10-CM | POA: Diagnosis not present

## 2015-08-08 DIAGNOSIS — C50411 Malignant neoplasm of upper-outer quadrant of right female breast: Secondary | ICD-10-CM | POA: Diagnosis not present

## 2015-08-08 LAB — GLUCOSE, CAPILLARY: GLUCOSE-CAPILLARY: 122 mg/dL — AB (ref 65–99)

## 2015-08-08 MED ORDER — FLUDEOXYGLUCOSE F - 18 (FDG) INJECTION: 17.3000 | Freq: Once | INTRAVENOUS | Status: DC | PRN

## 2015-08-12 ENCOUNTER — Encounter: Payer: Self-pay | Admitting: Hematology and Oncology

## 2015-08-12 ENCOUNTER — Ambulatory Visit (HOSPITAL_BASED_OUTPATIENT_CLINIC_OR_DEPARTMENT_OTHER): Payer: Medicare Other

## 2015-08-12 ENCOUNTER — Telehealth: Payer: Self-pay | Admitting: Hematology and Oncology

## 2015-08-12 ENCOUNTER — Telehealth: Payer: Self-pay | Admitting: Pharmacist

## 2015-08-12 ENCOUNTER — Ambulatory Visit (HOSPITAL_BASED_OUTPATIENT_CLINIC_OR_DEPARTMENT_OTHER): Payer: Medicare Other | Admitting: Hematology and Oncology

## 2015-08-12 VITALS — BP 152/77 | HR 73 | Temp 97.6°F | Resp 18 | Ht 69.0 in | Wt 300.6 lb

## 2015-08-12 DIAGNOSIS — C7951 Secondary malignant neoplasm of bone: Secondary | ICD-10-CM

## 2015-08-12 DIAGNOSIS — Z95828 Presence of other vascular implants and grafts: Secondary | ICD-10-CM

## 2015-08-12 DIAGNOSIS — Z79811 Long term (current) use of aromatase inhibitors: Secondary | ICD-10-CM

## 2015-08-12 DIAGNOSIS — C50411 Malignant neoplasm of upper-outer quadrant of right female breast: Secondary | ICD-10-CM | POA: Diagnosis not present

## 2015-08-12 DIAGNOSIS — Z5111 Encounter for antineoplastic chemotherapy: Secondary | ICD-10-CM

## 2015-08-12 DIAGNOSIS — Z170 Estrogen receptor positive status [ER+]: Secondary | ICD-10-CM | POA: Diagnosis not present

## 2015-08-12 DIAGNOSIS — Z452 Encounter for adjustment and management of vascular access device: Secondary | ICD-10-CM

## 2015-08-12 DIAGNOSIS — C773 Secondary and unspecified malignant neoplasm of axilla and upper limb lymph nodes: Secondary | ICD-10-CM

## 2015-08-12 MED ORDER — SODIUM CHLORIDE 0.9% FLUSH
10.0000 mL | INTRAVENOUS | Status: DC | PRN
  Administered 2015-08-12: 10 mL via INTRAVENOUS
  Filled 2015-08-12: qty 10

## 2015-08-12 MED ORDER — PALBOCICLIB 125 MG PO CAPS: 125.0000 mg | ORAL_CAPSULE | Freq: Every day | ORAL | Status: DC

## 2015-08-12 MED ORDER — FULVESTRANT 250 MG/5ML IM SOLN
500.0000 mg | INTRAMUSCULAR | Status: DC
  Administered 2015-08-12: 500 mg via INTRAMUSCULAR
  Filled 2015-08-12: qty 10

## 2015-08-12 MED ORDER — HEPARIN SOD (PORK) LOCK FLUSH 100 UNIT/ML IV SOLN
500.0000 [IU] | Freq: Once | INTRAVENOUS | Status: AC
  Administered 2015-08-12: 500 [IU] via INTRAVENOUS
  Filled 2015-08-12: qty 5

## 2015-08-12 MED FILL — *IBRANCE 125 MG CAPSULE: 125 | 21 days supply | Qty: 21 | Fill #0

## 2015-08-12 NOTE — Progress Notes (Signed)
Patient Care Team: Jacelyn Pi, MD as PCP - General (Endocrinology)  DIAGNOSIS: Breast cancer of upper-outer quadrant of right female breast [facility])   Staging form: Breast, AJCC 7th Edition     Clinical: Stage IIIA (T3, N1, M1) - Signed by Rulon Eisenmenger, MD on 03/05/2014     Pathologic: T3, N1a, M1 - Unsigned  SUMMARY OF ONCOLOGIC HISTORY:   Breast cancer of upper-outer quadrant of right female breast ([place])   02/02/2014 Mammogram Right breast: 5.8 cm irregular high-density solid mass suggestive of malignancy; left breast next millimeter internal mammary lymph node   02/04/2014 Initial Biopsy  2 biopsies were performed the right breast and axilla: Grade 2 invasive ductal carcinoma ER/PR positive HER-2 negative Ki-67 20% to 47%: Biopsy of right humerus metastatic breast cancer estrogen positive   02/17/2014 PET scan Hypermetabolic right breast cancer and right axillary lymph nodes subpectoral lymph nodes indeterminate right middle lobe pulmonary nodule and hypermetabolic proximal right humerus lesion   03/05/2014 - 07/19/2014 Neo-Adjuvant Chemotherapy Dose dense Adriamycin and Cytoxan x4 cycles followed by Abraxane weekly x12   07/26/2014 Breast MRI Multifocal breast cancer decreased in size, retroareolar 13 mm now 11 mm, middle third 22 mm now 18 mm, 3.9 x 3.9 cm now 3.9 x 2.6 cm, multiple smaller nodules decreased in size, right x-ray lymph node 3.6 cm now 2 cm other lymph nodes are smaller   07/28/2014 - 08/12/2015 Anti-estrogen oral therapy Anastrozole 1 mg daily   08/02/2014 PET scan Interval improvement in the right breast mass and axillary/subpectoral lymph nodes and right humerus head. Several subpleural nodules are not visible, 3.4. cm adrenal adenoma   09/22/2014 Surgery Bilateral mastectomy: Right: IDC Grade 2, 2.7cm, LVI, PNI, 6/8 LN Positive, Er 90%, PR 5% T2N2   03/22/2015 -  Radiation Therapy adjuvant radiation therapy with Dr. Isidore Moos   08/10/2015 PET scan Progression of disease with  several new bone metastases, T5, T7, left sacrum, left iliac bone, gastrohepatic lymph node   08/12/2015 -  Anti-estrogen oral therapy Faslodex with Ibrance    CHIEF COMPLIANT: Follow-up to review the PET CT scan  INTERVAL HISTORY: Tonya Alvarez is a 70 year old with above-mentioned history of metastatic breast cancer who recently underwent a PET CT scan is here to discuss the results. She has been on oral anastrozole and appears to have been tolerating it fairly well. Her husband requires full assistance from her request wheelchair ambulation. She also babysits her granddaughter who is 22-year-old. The PET CT scan showed progression of disease.  REVIEW OF SYSTEMS:   Constitutional: Denies fevers, chills or abnormal weight loss Eyes: Denies blurriness of vision Ears, nose, mouth, throat, and face: Denies mucositis or sore throat Respiratory: Denies cough, dyspnea or wheezes Cardiovascular: Denies palpitation, chest discomfort Gastrointestinal:  Denies nausea, heartburn or change in bowel habits Skin: Denies abnormal skin rashes Lymphatics: Denies new lymphadenopathy or easy bruising Neurological:Denies numbness, tingling or new weaknesses Behavioral/Psych: Mood is stable, no new changes  Extremities: No lower extremity edema All other systems were reviewed with the patient and are negative.  I have reviewed the past medical history, past surgical history, social history and family history with the patient and they are unchanged from previous note.  ALLERGIES:  is allergic to amoxicillin; hydrocodone; and lisinopril.  MEDICATIONS:  Current Outpatient Prescriptions  Medication Sig Dispense Refill  . acetaminophen (TYLENOL) 500 MG tablet Take 500 mg by mouth every 6 (six) hours as needed.    Marland Kitchen anastrozole (ARIMIDEX) 1 MG tablet Take  1 tablet by mouth  daily 90 tablet 3  . aspirin 81 MG tablet Take 81 mg by mouth at bedtime.     . calcium-vitamin D (OSCAL WITH D) 500-200 MG-UNIT per tablet  Take 1 tablet by mouth daily.    . fluticasone (FLONASE) 50 MCG/ACT nasal spray Place 1 spray into both nostrils daily as needed for allergies or rhinitis. 16 g 0  . gabapentin (NEURONTIN) 300 MG capsule Take 300 mg by mouth 2 (two) times daily.    . hyaluronate sodium (RADIAPLEXRX) GEL Apply 1 application topically once. Reported on 06/10/2015    . ibuprofen (ADVIL,MOTRIN) 600 MG tablet Take 600 mg by mouth every 12 (twelve) hours.    . insulin lispro protamine-lispro (HUMALOG 75/25 MIX) (75-25) 100 UNIT/ML SUSP injection Inject 60-68 Units into the skin 2 (two) times daily with a meal. Inject 60-68 units into the skin 2 times daily, and 10 ml in the middle of the day    . lidocaine-prilocaine (EMLA) cream Apply 1 application topically as needed. 30 g 6  . losartan (COZAAR) 50 MG tablet Take 50 mg by mouth every morning.    . metFORMIN (GLUCOPHAGE) 500 MG tablet Take 1,000 mg by mouth 2 (two) times daily with a meal.     . Multiple Vitamin (MULTIVITAMIN WITH MINERALS) TABS tablet Take 1 tablet by mouth at bedtime.    . palbociclib (IBRANCE) 125 MG capsule Take 1 capsule (125 mg total) by mouth daily with breakfast. whole with food. Take for 21 days, then take 7 days off. 21 capsule 0  . simvastatin (ZOCOR) 10 MG tablet Take 20 mg by mouth every evening.     Marland Kitchen tiZANidine (ZANAFLEX) 2 MG tablet Take 2 mg by mouth at bedtime as needed for muscle spasms. Reported on 06/10/2015     No current facility-administered medications for this visit.   Facility-Administered Medications Ordered in Other Visits  Medication Dose Route Frequency Provider Last Rate Last Dose  . fludeoxyglucose F - 18 (FDG) injection 17.3 milli Curie  17.3 milli Curie Intravenous Once PRN Nicholas Lose, MD      . fulvestrant (FASLODEX) injection 500 mg  500 mg Intramuscular Q14 Days Nicholas Lose, MD   500 mg at 08/12/15 1136    PHYSICAL EXAMINATION: ECOG PERFORMANCE STATUS: 1 - Symptomatic but completely ambulatory  Filed Vitals:     08/12/15 0931  BP: 152/77  Pulse: 73  Temp: 97.6 F (36.4 C)  Resp: 18   Filed Weights   08/12/15 0931  Weight: 300 lb 9.6 oz (136.351 kg)    GENERAL:alert, no distress and comfortable SKIN: skin color, texture, turgor are normal, no rashes or significant lesions EYES: normal, Conjunctiva are pink and non-injected, sclera clear OROPHARYNX:no exudate, no erythema and lips, buccal mucosa, and tongue normal  NECK: supple, thyroid normal size, non-tender, without nodularity LYMPH:  no palpable lymphadenopathy in the cervical, axillary or inguinal LUNGS: clear to auscultation and percussion with normal breathing effort HEART: regular rate & rhythm and no murmurs and no lower extremity edema ABDOMEN:abdomen soft, non-tender and normal bowel sounds MUSCULOSKELETAL:no cyanosis of digits and no clubbing  NEURO: alert & oriented x 3 with fluent speech, no focal motor/sensory deficits EXTREMITIES: No lower extremity edema  LABORATORY DATA:  I have reviewed the data as listed   Chemistry      Component Value Date/Time   NA 137 09/23/2014 0458   NA 142 07/19/2014 1005   K 4.3 09/23/2014 0458   K 4.1  07/19/2014 1005   CL 101 09/23/2014 0458   CO2 28 09/23/2014 0458   CO2 25 07/19/2014 1005   BUN 14 09/23/2014 0458   BUN 12.9 07/19/2014 1005   CREATININE 0.85 09/23/2014 0458   CREATININE 0.7 07/19/2014 1005      Component Value Date/Time   CALCIUM 8.9 09/23/2014 0458   CALCIUM 9.3 07/19/2014 1005   ALKPHOS 78 07/19/2014 1005   ALKPHOS 74 08/01/2010 1746   AST 15 07/19/2014 1005   AST 26 08/01/2010 1746   ALT 21 07/19/2014 1005   ALT 20 08/01/2010 1746   BILITOT 0.31 07/19/2014 1005   BILITOT 0.4 08/01/2010 1746       Lab Results  Component Value Date   WBC 8.1 09/23/2014   HGB 11.1* 09/23/2014   HCT 34.4* 09/23/2014   MCV 88.0 09/23/2014   PLT 242 09/23/2014   NEUTROABS 3.7 07/19/2014   ASSESSMENT & PLAN:  Breast cancer of upper-outer quadrant of right female  breast 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 Followed by 12 cycles of weekly Abraxane completed 07/19/2014 S/P Mastectomy 09/22/14: Bilateral mastectomies: Right: IDC Grade 2, 2.7cm, LVI, PNI, 6/8 LN Positive, Er 90%, PR 5% T2N2 (Stage IIIA) Nonhealing right breast wound: finally healed Adjuvant radiation therapy started 03/22/2015 to complete 05/11/2015  Anastrozole toxicities: Hot flashes for which she is prescribed gabapentin 300 twice a day Current treatment: antiestrogen therapy with anastrozole 1 mg daily Jan 2017 PET/CT scan 08/08/2015: Multifocal areas of increased hypermetabolic is in new/progressive but without definite underlying lesion on CT scan suspicious for metastases, T5, T7, left sacrum, left iliac bone; 13 mm gastrohepatic node SUV 5.2 Suspicious for nodal metastases.  Treatment plan: I recommended changing treatment to Ibrance with Faslodex.  Ibrance: I discussed the risks and benefits of Ibrance including myelosuppression especially neutropenia and with that risk of infection, there is risk of pulmonary embolism and mild peripheral neuropathy as well. Fatigue, nausea, diarrhea, decreased appetite as well as alopecia and thrombocytopenia are also potential side effects of Ibrance   I also recommended bisphosphonate therapy because of bone involvement. However patient has a tooth that needs to be extracted. We will await the tooth extraction before initiating bisphosphonate therapy. She could be receiving XGeva once every 3 months.  No orders of the defined types were placed in this encounter.   The patient has a good understanding of the overall plan. she agrees with it. she will call with any problems that may develop before the next visit here.   Rulon Eisenmenger, MD 08/12/2015

## 2015-08-12 NOTE — Patient Instructions (Signed)

## 2015-08-12 NOTE — Telephone Encounter (Signed)
appt made and avs printed °

## 2015-08-12 NOTE — Telephone Encounter (Signed)
08/12/15: New Rx for Ibrance sent to [facility]. Prior Auth requested from [facility] through covermymeds. Prior Auth Pending

## 2015-08-12 NOTE — Patient Instructions (Signed)

## 2015-08-12 NOTE — Assessment & Plan Note (Addendum)
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 Followed by 12 cycles of weekly Abraxane completed 07/19/2014 S/P Mastectomy 09/22/14: Bilateral mastectomies: Right: IDC Grade 2, 2.7cm, LVI, PNI, 6/8 LN Positive, Er 90%, PR 5% T2N2 (Stage IIIA) Nonhealing right breast wound: finally healed Adjuvant radiation therapy started 03/22/2015 to complete 05/11/2015  Anastrozole toxicities: Hot flashes for which she is prescribed gabapentin 300 twice a day Current treatment: antiestrogen therapy with anastrozole 1 mg daily Jan 2017 PET/CT scan 08/08/2015: Multifocal areas of increased hypermetabolic is in new/progressive but without definite underlying lesion on CT scan suspicious for metastases, T5, T7, left sacrum, left iliac bone; 13 mm gastrohepatic node SUV 5.2 Suspicious for nodal metastases.  Treatment plan: I recommended changing treatment to Ibrance with Faslodex.  Ibrance: I discussed the risks and benefits of Ibrance including myelosuppression especially neutropenia and with that risk of infection, there is risk of pulmonary embolism and mild peripheral neuropathy as well. Fatigue, nausea, diarrhea, decreased appetite as well as alopecia and thrombocytopenia are also potential side effects of Ibrance

## 2015-08-15 ENCOUNTER — Encounter: Payer: Self-pay | Admitting: Pharmacist

## 2015-08-15 DIAGNOSIS — M1711 Unilateral primary osteoarthritis, right knee: Secondary | ICD-10-CM | POA: Diagnosis not present

## 2015-08-15 NOTE — Progress Notes (Signed)
08/15/15: Attempted to reach patient for follow up on oral medication: [name]. No answer. Left VM for patient to call back with any questions or issues. Prior [name] is still pending after requiring an appeal (appeal sent in on 4/10 - there should be no issue have this reversed). Patient should have 1 month free voucher from [employer].   Thank you,  Montel Clock, PharmD, [facility] [phone]

## 2015-08-17 NOTE — Telephone Encounter (Signed)
4/12/17[name] approved. Pt started previously last on free 1 month voucher through [employer].

## 2015-08-18 ENCOUNTER — Encounter: Payer: Self-pay | Admitting: Pharmacist

## 2015-08-18 DIAGNOSIS — C50911 Malignant neoplasm of unspecified site of right female breast: Secondary | ICD-10-CM | POA: Diagnosis not present

## 2015-08-18 NOTE — Progress Notes (Signed)
Oral Chemotherapy Pharmacist Encounter   I spoke with patient for overview of new oral chemotherapy medication: Ibrance. Pt is doing well. The prescriptions have been sent to the [place] for benefit analysis and approval. Pt started [place] on 08/16/15 with a free 4-month trial. Leslee Home now approved with high copay but patient signed up for $5,000 grant with [employer].   Spoke with patient this afternoon for overview of Ibrance.   Counseled patient on administration, dosing, side effects, safe handling, and monitoring. Side effects include but not limited to: Fatigue, low blood counts, hair loss, nausea, and diarrhea.  Ms. Eakle voiced understanding and appreciation.   All questions answered.  Will follow up in 1-2 weeks for adherence and toxicity management.   Thank you,  Montel Clock, PharmD, [facility]

## 2015-08-18 NOTE — Progress Notes (Signed)
08/18/15: patient approved for breast cancer fund through [employer] for $5,000. Pharmacy card faxed to [facility] pharmacy. Attempted to contact patient but could only leave a VM again.   Thank you,  Montel Clock, PharmD, [facility]

## 2015-08-22 DIAGNOSIS — M1711 Unilateral primary osteoarthritis, right knee: Secondary | ICD-10-CM | POA: Diagnosis not present

## 2015-08-25 ENCOUNTER — Other Ambulatory Visit: Payer: Self-pay

## 2015-08-25 ENCOUNTER — Encounter: Payer: Self-pay | Admitting: Pharmacist

## 2015-08-25 DIAGNOSIS — C50411 Malignant neoplasm of upper-outer quadrant of right female breast: Secondary | ICD-10-CM

## 2015-08-25 DIAGNOSIS — C7951 Secondary malignant neoplasm of bone: Secondary | ICD-10-CM

## 2015-08-25 NOTE — Progress Notes (Signed)
Oral Chemotherapy Follow-Up Form  Original Start date of oral chemotherapy: _4/11/17___   Called patient today to follow up regarding patient's oral chemotherapy medication: _Ibrance 125 mg_  Pt is doing well today. She has been on Ibrance for a little over 1 week now. She reports no side effects besides some possible minor fatigue. No missed doses. No issues with nausea. Pt says she uses a weekly pill box to help her remember her medications.   Pt reports __0__ doses missed in the last week.    Pt reports the following side effects: _minor fatigue_____   Will follow up and call patient again in _2 weeks___   Thank you,  Montel Clock, PharmD, [facility]

## 2015-08-26 ENCOUNTER — Ambulatory Visit (HOSPITAL_BASED_OUTPATIENT_CLINIC_OR_DEPARTMENT_OTHER): Payer: Medicare Other

## 2015-08-26 ENCOUNTER — Other Ambulatory Visit (HOSPITAL_BASED_OUTPATIENT_CLINIC_OR_DEPARTMENT_OTHER): Payer: Medicare Other

## 2015-08-26 DIAGNOSIS — Z95828 Presence of other vascular implants and grafts: Secondary | ICD-10-CM

## 2015-08-26 DIAGNOSIS — C50411 Malignant neoplasm of upper-outer quadrant of right female breast: Secondary | ICD-10-CM

## 2015-08-26 DIAGNOSIS — C7951 Secondary malignant neoplasm of bone: Secondary | ICD-10-CM

## 2015-08-26 DIAGNOSIS — Z452 Encounter for adjustment and management of vascular access device: Secondary | ICD-10-CM

## 2015-08-26 LAB — CBC WITH DIFFERENTIAL/PLATELET
BASO%: 0.5 % (ref 0.0–2.0)
Basophils Absolute: 0 10*3/uL (ref 0.0–0.1)
EOS ABS: 0.1 10*3/uL (ref 0.0–0.5)
EOS%: 1.5 % (ref 0.0–7.0)
HCT: 36.6 % (ref 34.8–46.6)
HGB: 11.8 g/dL (ref 11.6–15.9)
LYMPH%: 26.5 % (ref 14.0–49.7)
MCH: 27.4 pg (ref 25.1–34.0)
MCHC: 32.2 g/dL (ref 31.5–36.0)
MCV: 84.9 fL (ref 79.5–101.0)
MONO#: 0.1 10*3/uL (ref 0.1–0.9)
MONO%: 3.6 % (ref 0.0–14.0)
NEUT#: 2.7 10*3/uL (ref 1.5–6.5)
NEUT%: 67.9 % (ref 38.4–76.8)
PLATELETS: 224 10*3/uL (ref 145–400)
RBC: 4.31 10*6/uL (ref 3.70–5.45)
RDW: 17 % — ABNORMAL HIGH (ref 11.2–14.5)
WBC: 3.9 10*3/uL (ref 3.9–10.3)
lymph#: 1 10*3/uL (ref 0.9–3.3)

## 2015-08-26 LAB — COMPREHENSIVE METABOLIC PANEL
ALT: 9 U/L (ref 0–55)
ANION GAP: 9 meq/L (ref 3–11)
AST: 12 U/L (ref 5–34)
Albumin: 3.3 g/dL — ABNORMAL LOW (ref 3.5–5.0)
Alkaline Phosphatase: 77 U/L (ref 40–150)
BUN: 20.1 mg/dL (ref 7.0–26.0)
CHLORIDE: 106 meq/L (ref 98–109)
CO2: 25 meq/L (ref 22–29)
Calcium: 9.5 mg/dL (ref 8.4–10.4)
Creatinine: 0.8 mg/dL (ref 0.6–1.1)
EGFR: 74 mL/min/{1.73_m2} — AB (ref >90)
Glucose: 129 mg/dl (ref 70–140)
Potassium: 4.4 mEq/L (ref 3.5–5.1)
Sodium: 140 mEq/L (ref 136–145)
TOTAL PROTEIN: 6.4 g/dL (ref 6.4–8.3)

## 2015-08-26 MED ORDER — HEPARIN SOD (PORK) LOCK FLUSH 100 UNIT/ML IV SOLN
500.0000 [IU] | Freq: Once | INTRAVENOUS | Status: AC
  Administered 2015-08-26: 500 [IU] via INTRAVENOUS
  Filled 2015-08-26: qty 5

## 2015-08-26 MED ORDER — SODIUM CHLORIDE 0.9% FLUSH
10.0000 mL | INTRAVENOUS | Status: DC | PRN
  Administered 2015-08-26: 10 mL via INTRAVENOUS
  Filled 2015-08-26: qty 10

## 2015-08-26 NOTE — Patient Instructions (Signed)

## 2015-08-29 ENCOUNTER — Telehealth: Payer: Self-pay | Admitting: Hematology and Oncology

## 2015-08-29 ENCOUNTER — Encounter: Payer: Self-pay | Admitting: Hematology and Oncology

## 2015-08-29 ENCOUNTER — Ambulatory Visit (HOSPITAL_BASED_OUTPATIENT_CLINIC_OR_DEPARTMENT_OTHER): Payer: Medicare Other

## 2015-08-29 ENCOUNTER — Other Ambulatory Visit: Payer: Medicare Other

## 2015-08-29 ENCOUNTER — Ambulatory Visit (HOSPITAL_BASED_OUTPATIENT_CLINIC_OR_DEPARTMENT_OTHER): Payer: Medicare Other | Admitting: Hematology and Oncology

## 2015-08-29 VITALS — BP 136/70 | HR 78 | Temp 97.8°F | Resp 18 | Ht 69.0 in | Wt 302.8 lb

## 2015-08-29 DIAGNOSIS — C7951 Secondary malignant neoplasm of bone: Secondary | ICD-10-CM | POA: Diagnosis not present

## 2015-08-29 DIAGNOSIS — I10 Essential (primary) hypertension: Secondary | ICD-10-CM

## 2015-08-29 DIAGNOSIS — C50411 Malignant neoplasm of upper-outer quadrant of right female breast: Secondary | ICD-10-CM

## 2015-08-29 DIAGNOSIS — Z79811 Long term (current) use of aromatase inhibitors: Secondary | ICD-10-CM

## 2015-08-29 DIAGNOSIS — Z5111 Encounter for antineoplastic chemotherapy: Secondary | ICD-10-CM | POA: Diagnosis not present

## 2015-08-29 DIAGNOSIS — C773 Secondary and unspecified malignant neoplasm of axilla and upper limb lymph nodes: Secondary | ICD-10-CM

## 2015-08-29 DIAGNOSIS — Z170 Estrogen receptor positive status [ER+]: Secondary | ICD-10-CM | POA: Diagnosis not present

## 2015-08-29 DIAGNOSIS — M1711 Unilateral primary osteoarthritis, right knee: Secondary | ICD-10-CM | POA: Diagnosis not present

## 2015-08-29 MED ORDER — FULVESTRANT 250 MG/5ML IM SOLN
500.0000 mg | INTRAMUSCULAR | Status: DC
  Administered 2015-08-29: 500 mg via INTRAMUSCULAR
  Filled 2015-08-29: qty 10

## 2015-08-29 NOTE — Telephone Encounter (Signed)
lvm to inform patient of 2 week appt date/time per 4/24 pof

## 2015-08-29 NOTE — Assessment & Plan Note (Signed)
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 Followed by 12 cycles of weekly Abraxane completed 07/19/2014 S/P Mastectomy 09/22/14: Bilateral mastectomies: Right: IDC Grade 2, 2.7cm, LVI, PNI, 6/8 LN Positive, Er 90%, PR 5% T2N2 (Stage IIIA) Nonhealing right breast wound: finally healed Adjuvant radiation therapy started 03/22/2015 to complete 05/11/2015  Treatment Summary: antiestrogen therapy with anastrozole 1 mg daily Jan 2017- April 2017 (stopped when she got diagnosed with metastatic disease) Anastrozole toxicities: Hot flashes for which she is prescribed gabapentin 300 twice a day  PET/CT scan 08/08/2015: Multifocal areas of increased hypermetabolic is in new/progressive but without definite underlying lesion on CT scan suspicious for metastases, T5, T7, left sacrum, left iliac bone; 13 mm gastrohepatic node SUV 5.2 Suspicious for nodal metastases.  Met diseaseTreatment plan:Ibrance with Faslodex.  Ibrance toxicities:   I also recommended bisphosphonate therapy because of bone involvement. However patient has a tooth that needs to be extracted. We will await the tooth extraction before initiating bisphosphonate therapy.  She could be receiving XGeva once every 3 months.

## 2015-08-29 NOTE — Progress Notes (Signed)
Patient Care Team: Jacelyn Pi, MD as PCP - General (Endocrinology)  DIAGNOSIS: Breast cancer of upper-outer quadrant of right female breast [facility])   Staging form: Breast, AJCC 7th Edition     Clinical: Stage IIIA (T3, N1, M1) - Signed by Rulon Eisenmenger, MD on 03/05/2014     Pathologic: T3, N1a, M1 - Unsigned   SUMMARY OF ONCOLOGIC HISTORY:   Breast cancer of upper-outer quadrant of right female breast ([place])   02/02/2014 Mammogram Right breast: 5.8 cm irregular high-density solid mass suggestive of malignancy; left breast next millimeter internal mammary lymph node   02/04/2014 Initial Biopsy  2 biopsies were performed the right breast and axilla: Grade 2 invasive ductal carcinoma ER/PR positive HER-2 negative Ki-67 20% to 47%: Biopsy of right humerus metastatic breast cancer estrogen positive   02/17/2014 PET scan Hypermetabolic right breast cancer and right axillary lymph nodes subpectoral lymph nodes indeterminate right middle lobe pulmonary nodule and hypermetabolic proximal right humerus lesion   03/05/2014 - 07/19/2014 Neo-Adjuvant Chemotherapy Dose dense Adriamycin and Cytoxan x4 cycles followed by Abraxane weekly x12   07/26/2014 Breast MRI Multifocal breast cancer decreased in size, retroareolar 13 mm now 11 mm, middle third 22 mm now 18 mm, 3.9 x 3.9 cm now 3.9 x 2.6 cm, multiple smaller nodules decreased in size, right x-ray lymph node 3.6 cm now 2 cm other lymph nodes are smaller   07/28/2014 - 08/12/2015 Anti-estrogen oral therapy Anastrozole 1 mg daily   08/02/2014 PET scan Interval improvement in the right breast mass and axillary/subpectoral lymph nodes and right humerus head. Several subpleural nodules are not visible, 3.4. cm adrenal adenoma   09/22/2014 Surgery Bilateral mastectomy: Right: IDC Grade 2, 2.7cm, LVI, PNI, 6/8 LN Positive, Er 90%, PR 5% T2N2   03/22/2015 -  Radiation Therapy adjuvant radiation therapy with Dr. Isidore Moos   08/10/2015 PET scan Progression of disease with  several new bone metastases, T5, T7, left sacrum, left iliac bone, gastrohepatic lymph node   08/12/2015 -  Anti-estrogen oral therapy Faslodex with Leslee Home    CHIEF COMPLIANT: follow-up Ibrance with Asmanex  INTERVAL HISTORY: Tonya Alvarez is a 70 year old with above-mentioned history metastatic breast cancer currently Ibrance with Fosamax. She appears to tolerate her Leslee Home fairly well. She does have mild fatigue as well as mild nausea. She is not neutropenic today. She does believe that her niece was diagnosed with breast cancer as well and is starting to get chemotherapy.  REVIEW OF SYSTEMS:   Constitutional: Denies fevers, chills or abnormal weight loss Eyes: Denies blurriness of vision Ears, nose, mouth, throat, and face: Denies mucositis or sore throat Respiratory: Denies cough, dyspnea or wheezes Cardiovascular: Denies palpitation, chest discomfort Gastrointestinal:  Denies nausea, heartburn or change in bowel habits Skin: Denies abnormal skin rashes Lymphatics: Denies new lymphadenopathy or easy bruising Neurological:Denies numbness, tingling or new weaknesses Behavioral/Psych: Mood is stable, no new changes  Extremities: No lower extremity edema Breast:  denies any pain or lumps or nodules in either breasts All other systems were reviewed with the patient and are negative.  I have reviewed the past medical history, past surgical history, social history and family history with the patient and they are unchanged from previous note.  ALLERGIES:  is allergic to amoxicillin; hydrocodone; and lisinopril.  MEDICATIONS:  Current Outpatient Prescriptions  Medication Sig Dispense Refill  . acetaminophen (TYLENOL) 500 MG tablet Take 500 mg by mouth every 6 (six) hours as needed.    Marland Kitchen anastrozole (ARIMIDEX) 1 MG tablet  Take 1 tablet by mouth  daily 90 tablet 3  . aspirin 81 MG tablet Take 81 mg by mouth at bedtime.     . calcium-vitamin D (OSCAL WITH D) 500-200 MG-UNIT per tablet Take  1 tablet by mouth daily.    . fluticasone (FLONASE) 50 MCG/ACT nasal spray Place 1 spray into both nostrils daily as needed for allergies or rhinitis. 16 g 0  . gabapentin (NEURONTIN) 300 MG capsule Take 300 mg by mouth 2 (two) times daily.    . hyaluronate sodium (RADIAPLEXRX) GEL Apply 1 application topically once. Reported on 06/10/2015    . ibuprofen (ADVIL,MOTRIN) 600 MG tablet Take 600 mg by mouth every 12 (twelve) hours.    . insulin lispro protamine-lispro (HUMALOG 75/25 MIX) (75-25) 100 UNIT/ML SUSP injection Inject 60-68 Units into the skin 2 (two) times daily with a meal. Inject 60-68 units into the skin 2 times daily, and 10 ml in the middle of the day    . lidocaine-prilocaine (EMLA) cream Apply 1 application topically as needed. 30 g 6  . losartan (COZAAR) 50 MG tablet Take 50 mg by mouth every morning.    . metFORMIN (GLUCOPHAGE) 500 MG tablet Take 1,000 mg by mouth 2 (two) times daily with a meal.     . Multiple Vitamin (MULTIVITAMIN WITH MINERALS) TABS tablet Take 1 tablet by mouth at bedtime.    . palbociclib (IBRANCE) 125 MG capsule Take 1 capsule (125 mg total) by mouth daily with breakfast. whole with food. Take for 21 days, then take 7 days off. 21 capsule 0  . simvastatin (ZOCOR) 10 MG tablet Take 20 mg by mouth every evening.     Marland Kitchen tiZANidine (ZANAFLEX) 2 MG tablet Take 2 mg by mouth at bedtime as needed for muscle spasms. Reported on 06/10/2015     No current facility-administered medications for this visit.    PHYSICAL EXAMINATION: ECOG PERFORMANCE STATUS: 1 - Symptomatic but completely ambulatory  Filed Vitals:   08/29/15 1428  BP: 136/70  Pulse: 78  Temp: 97.8 F (36.6 C)  Resp: 18   Filed Weights   08/29/15 1428  Weight: 302 lb 12.8 oz (137.349 kg)    GENERAL:alert, no distress and comfortable SKIN: skin color, texture, turgor are normal, no rashes or significant lesions EYES: normal, Conjunctiva are pink and non-injected, sclera clear OROPHARYNX:no  exudate, no erythema and lips, buccal mucosa, and tongue normal  NECK: supple, thyroid normal size, non-tender, without nodularity LYMPH:  no palpable lymphadenopathy in the cervical, axillary or inguinal LUNGS: clear to auscultation and percussion with normal breathing effort HEART: regular rate & rhythm and no murmurs and no lower extremity edema ABDOMEN:abdomen soft, non-tender and normal bowel sounds MUSCULOSKELETAL:no cyanosis of digits and no clubbing  NEURO: alert & oriented x 3 with fluent speech, no focal motor/sensory deficits EXTREMITIES: No lower extremity edema  LABORATORY DATA:  I have reviewed the data as listed   Chemistry      Component Value Date/Time   NA 140 08/26/2015 1428   NA 137 09/23/2014 0458   K 4.4 08/26/2015 1428   K 4.3 09/23/2014 0458   CL 101 09/23/2014 0458   CO2 25 08/26/2015 1428   CO2 28 09/23/2014 0458   BUN 20.1 08/26/2015 1428   BUN 14 09/23/2014 0458   CREATININE 0.8 08/26/2015 1428   CREATININE 0.85 09/23/2014 0458      Component Value Date/Time   CALCIUM 9.5 08/26/2015 1428   CALCIUM 8.9 09/23/2014 0458  ALKPHOS 77 08/26/2015 1428   ALKPHOS 74 08/01/2010 1746   AST 12 08/26/2015 1428   AST 26 08/01/2010 1746   ALT 9 08/26/2015 1428   ALT 20 08/01/2010 1746   BILITOT <0.30 08/26/2015 1428   BILITOT 0.4 08/01/2010 1746       Lab Results  Component Value Date   WBC 3.9 08/26/2015   HGB 11.8 08/26/2015   HCT 36.6 08/26/2015   MCV 84.9 08/26/2015   PLT 224 08/26/2015   NEUTROABS 2.7 08/26/2015   ASSESSMENT & PLAN:  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 Followed by 12 cycles of weekly Abraxane completed 07/19/2014 S/P Mastectomy 09/22/14: Bilateral mastectomies: Right: IDC Grade 2, 2.7cm, LVI, PNI,  6/8 LN Positive, Er 90%, PR 5% T2N2 (Stage IIIA) Nonhealing right breast wound: finally healed Adjuvant radiation therapy started 03/22/2015 to complete 05/11/2015  Treatment Summary: antiestrogen therapy with anastrozole 1 mg daily Jan 2017- April 2017 (stopped when she got diagnosed with metastatic disease) Anastrozole toxicities: Hot flashes for which she is prescribed gabapentin 300 twice a day  PET/CT scan 08/08/2015: Multifocal areas of increased hypermetabolic is in new/progressive but without definite underlying lesion on CT scan suspicious for metastases, T5, T7, left sacrum, left iliac bone; 13 mm gastrohepatic node SUV 5.2 Suspicious for nodal metastases. ----------------------------------------------------------------------------------------------------------------------------------------------------------------- Met diseaseTreatment plan:Ibrance with Faslodex. Started 08/12/2015  Ibrance toxicities:  1. Mild fatigue 2. Mild nausea Patient got a grant to pay for [employer]. Blood work today was reviewed and it does not show any evidence of neutropenia.  Bone metastases:I also recommended bisphosphonate therapy because of bone involvement. However patient has a tooth that needs to be extracted. We will await the tooth extraction before initiating bisphosphonate therapy.  She could be receiving XGeva once every 3 months.  Return to clinic in 2 weeks for blood work and follow-up  No orders of the defined types were placed in this encounter.   The patient has a good understanding of the overall plan. she agrees with it. she will call with any problems that may develop before the next visit here.   Rulon Eisenmenger, MD 08/29/2015

## 2015-09-07 ENCOUNTER — Telehealth: Payer: Self-pay | Admitting: Pharmacist

## 2015-09-07 NOTE — Telephone Encounter (Addendum)
Patient called and left VM on oral chemotherapy clinic phone. She stated that she would need a refill on her Ibrance by 09/13/15. I called patient back. Reviewed patient appointment schedule. She is to have labs and see Dr. Lindi Adie on 09/12/15. He will review her labs at that visit and write for her next cycle of Ibrance. She can then have her prescription filled at [facility] outpatient pharmacy as she did with her first cycle of medication. She thanked me for the phone call.  Oral Chemotherapy Follow-Up Form Original Start date of oral chemotherapy: 08/16/15 Patient's oral chemotherapy medication: Ibrance 125 mg Pt is doing well. She completed her first cycle of Ibrance. She reports no side effects besides some possible minor fatigue. No missed doses. No issues with nausea.  Pt reports __0__ doses missed in the last week.  Pt reports the following side effects: minor fatigue Will follow up and call patient again in 1 month.  Thank you, Raul Del, PharmD, BCPS, [facility]  Phone:  [phone]

## 2015-09-09 ENCOUNTER — Other Ambulatory Visit: Payer: Self-pay

## 2015-09-09 DIAGNOSIS — Z95828 Presence of other vascular implants and grafts: Secondary | ICD-10-CM

## 2015-09-12 ENCOUNTER — Ambulatory Visit (HOSPITAL_BASED_OUTPATIENT_CLINIC_OR_DEPARTMENT_OTHER): Payer: Medicare Other

## 2015-09-12 ENCOUNTER — Encounter: Payer: Self-pay | Admitting: Hematology and Oncology

## 2015-09-12 ENCOUNTER — Other Ambulatory Visit (HOSPITAL_BASED_OUTPATIENT_CLINIC_OR_DEPARTMENT_OTHER): Payer: Medicare Other

## 2015-09-12 ENCOUNTER — Ambulatory Visit: Payer: Medicare Other

## 2015-09-12 ENCOUNTER — Ambulatory Visit (HOSPITAL_BASED_OUTPATIENT_CLINIC_OR_DEPARTMENT_OTHER): Payer: Medicare Other | Admitting: Hematology and Oncology

## 2015-09-12 ENCOUNTER — Telehealth: Payer: Self-pay | Admitting: Hematology and Oncology

## 2015-09-12 VITALS — BP 143/57 | HR 81 | Temp 98.2°F | Resp 19 | Wt 304.1 lb

## 2015-09-12 DIAGNOSIS — Z5111 Encounter for antineoplastic chemotherapy: Secondary | ICD-10-CM

## 2015-09-12 DIAGNOSIS — Z170 Estrogen receptor positive status [ER+]: Secondary | ICD-10-CM | POA: Diagnosis not present

## 2015-09-12 DIAGNOSIS — Z95828 Presence of other vascular implants and grafts: Secondary | ICD-10-CM

## 2015-09-12 DIAGNOSIS — C50411 Malignant neoplasm of upper-outer quadrant of right female breast: Secondary | ICD-10-CM

## 2015-09-12 DIAGNOSIS — C7951 Secondary malignant neoplasm of bone: Secondary | ICD-10-CM

## 2015-09-12 DIAGNOSIS — C773 Secondary and unspecified malignant neoplasm of axilla and upper limb lymph nodes: Secondary | ICD-10-CM

## 2015-09-12 DIAGNOSIS — Z452 Encounter for adjustment and management of vascular access device: Secondary | ICD-10-CM | POA: Diagnosis not present

## 2015-09-12 LAB — CBC WITH DIFFERENTIAL/PLATELET
BASO%: 3.8 % — AB (ref 0.0–2.0)
Basophils Absolute: 0.1 10*3/uL (ref 0.0–0.1)
EOS%: 0.8 % (ref 0.0–7.0)
Eosinophils Absolute: 0 10*3/uL (ref 0.0–0.5)
HEMATOCRIT: 37.3 % (ref 34.8–46.6)
HGB: 12.1 g/dL (ref 11.6–15.9)
LYMPH#: 1.1 10*3/uL (ref 0.9–3.3)
LYMPH%: 35.7 % (ref 14.0–49.7)
MCH: 27.7 pg (ref 25.1–34.0)
MCHC: 32.4 g/dL (ref 31.5–36.0)
MCV: 85.7 fL (ref 79.5–101.0)
MONO#: 0.4 10*3/uL (ref 0.1–0.9)
MONO%: 13.3 % (ref 0.0–14.0)
NEUT%: 46.4 % (ref 38.4–76.8)
NEUTROS ABS: 1.4 10*3/uL — AB (ref 1.5–6.5)
PLATELETS: 232 10*3/uL (ref 145–400)
RBC: 4.36 10*6/uL (ref 3.70–5.45)
RDW: 21.3 % — ABNORMAL HIGH (ref 11.2–14.5)
WBC: 3.1 10*3/uL — AB (ref 3.9–10.3)

## 2015-09-12 LAB — COMPREHENSIVE METABOLIC PANEL
ALT: 15 U/L (ref 0–55)
ANION GAP: 8 meq/L (ref 3–11)
AST: 11 U/L (ref 5–34)
Albumin: 3.6 g/dL (ref 3.5–5.0)
Alkaline Phosphatase: 75 U/L (ref 40–150)
BUN: 17.4 mg/dL (ref 7.0–26.0)
CALCIUM: 9.6 mg/dL (ref 8.4–10.4)
CO2: 26 meq/L (ref 22–29)
CREATININE: 0.7 mg/dL (ref 0.6–1.1)
Chloride: 105 mEq/L (ref 98–109)
EGFR: 85 mL/min/{1.73_m2} — ABNORMAL LOW (ref >90)
Glucose: 91 mg/dl (ref 70–140)
Potassium: 4.1 mEq/L (ref 3.5–5.1)
Sodium: 139 mEq/L (ref 136–145)
TOTAL PROTEIN: 6.8 g/dL (ref 6.4–8.3)

## 2015-09-12 MED ORDER — FULVESTRANT 250 MG/5ML IM SOLN
500.0000 mg | INTRAMUSCULAR | Status: DC
  Administered 2015-09-12: 500 mg via INTRAMUSCULAR
  Filled 2015-09-12: qty 10

## 2015-09-12 MED ORDER — SODIUM CHLORIDE 0.9 % IJ SOLN
10.0000 mL | INTRAMUSCULAR | Status: DC | PRN
  Administered 2015-09-12: 10 mL via INTRAVENOUS
  Filled 2015-09-12: qty 10

## 2015-09-12 MED ORDER — PALBOCICLIB 125 MG PO CAPS: 125.0000 mg | ORAL_CAPSULE | Freq: Every day | ORAL | Status: DC

## 2015-09-12 MED ORDER — HEPARIN SOD (PORK) LOCK FLUSH 100 UNIT/ML IV SOLN
500.0000 [IU] | Freq: Once | INTRAVENOUS | Status: AC | PRN
  Administered 2015-09-12: 500 [IU] via INTRAVENOUS
  Filled 2015-09-12: qty 5

## 2015-09-12 MED FILL — *IBRANCE 125 MG CAPSULE: 125 | 28 days supply | Qty: 21 | Fill #0

## 2015-09-12 NOTE — Progress Notes (Signed)
Patient Care Team: Jacelyn Pi, MD as PCP - General (Endocrinology)  DIAGNOSIS: Breast cancer of upper-outer quadrant of right female breast [facility])   Staging form: Breast, AJCC 7th Edition     Clinical: Stage IIIA (T3, N1, M1) - Signed by Rulon Eisenmenger, MD on 03/05/2014     Pathologic: T3, N1a, M1 - Unsigned   SUMMARY OF ONCOLOGIC HISTORY:   Breast cancer of upper-outer quadrant of right female breast ([place])   02/02/2014 Mammogram Right breast: 5.8 cm irregular high-density solid mass suggestive of malignancy; left breast next millimeter internal mammary lymph node   02/04/2014 Initial Biopsy  2 biopsies were performed the right breast and axilla: Grade 2 invasive ductal carcinoma ER/PR positive HER-2 negative Ki-67 20% to 47%: Biopsy of right humerus metastatic breast cancer estrogen positive   02/17/2014 PET scan Hypermetabolic right breast cancer and right axillary lymph nodes subpectoral lymph nodes indeterminate right middle lobe pulmonary nodule and hypermetabolic proximal right humerus lesion   03/05/2014 - 07/19/2014 Neo-Adjuvant Chemotherapy Dose dense Adriamycin and Cytoxan x4 cycles followed by Abraxane weekly x12   07/26/2014 Breast MRI Multifocal breast cancer decreased in size, retroareolar 13 mm now 11 mm, middle third 22 mm now 18 mm, 3.9 x 3.9 cm now 3.9 x 2.6 cm, multiple smaller nodules decreased in size, right x-ray lymph node 3.6 cm now 2 cm other lymph nodes are smaller   07/28/2014 - 08/12/2015 Anti-estrogen oral therapy Anastrozole 1 mg daily   08/02/2014 PET scan Interval improvement in the right breast mass and axillary/subpectoral lymph nodes and right humerus head. Several subpleural nodules are not visible, 3.4. cm adrenal adenoma   09/22/2014 Surgery Bilateral mastectomy: Right: IDC Grade 2, 2.7cm, LVI, PNI, 6/8 LN Positive, Er 90%, PR 5% T2N2   03/22/2015 -  Radiation Therapy adjuvant radiation therapy with Dr. Isidore Moos   08/10/2015 PET scan Progression of disease with  several new bone metastases, T5, T7, left sacrum, left iliac bone, gastrohepatic lymph node   08/12/2015 -  Anti-estrogen oral therapy Faslodex with Leslee Home    CHIEF COMPLIANT: follow-up of Ibrance with Faslodex  INTERVAL HISTORY: Tonya Alvarez is a 70 year old with above-mentioned history of metastatic breast cancer currently on Faslodex with Ibrance. Today is her cycle 2 of treatment. She tolerated cycle 1 fairly well. She has noticed that her blood sugars have decreased significantly on the treatment. She is using much less insulin.  REVIEW OF SYSTEMS:   Constitutional: Denies fevers, chills or abnormal weight loss Eyes: Denies blurriness of vision Ears, nose, mouth, throat, and face: Denies mucositis or sore throat Respiratory: Denies cough, dyspnea or wheezes Cardiovascular: Denies palpitation, chest discomfort Gastrointestinal:  Denies nausea, heartburn or change in bowel habits Skin: Denies abnormal skin rashes Lymphatics: Denies new lymphadenopathy or easy bruising Neurological:Denies numbness, tingling or new weaknesses Behavioral/Psych: Mood is stable, no new changes  Extremities: No lower extremity edema Breast:  denies any pain or lumps or nodules in either breasts All other systems were reviewed with the patient and are negative.  I have reviewed the past medical history, past surgical history, social history and family history with the patient and they are unchanged from previous note.  ALLERGIES:  is allergic to amoxicillin; hydrocodone; and lisinopril.  MEDICATIONS:  Current Outpatient Prescriptions  Medication Sig Dispense Refill  . acetaminophen (TYLENOL) 500 MG tablet Take 500 mg by mouth every 6 (six) hours as needed.    Marland Kitchen anastrozole (ARIMIDEX) 1 MG tablet Take 1 tablet by mouth  daily  90 tablet 3  . aspirin 81 MG tablet Take 81 mg by mouth at bedtime.     . calcium-vitamin D (OSCAL WITH D) 500-200 MG-UNIT per tablet Take 1 tablet by mouth daily.    . fluticasone  (FLONASE) 50 MCG/ACT nasal spray Place 1 spray into both nostrils daily as needed for allergies or rhinitis. 16 g 0  . gabapentin (NEURONTIN) 300 MG capsule Take 300 mg by mouth 2 (two) times daily.    . hyaluronate sodium (RADIAPLEXRX) GEL Apply 1 application topically once. Reported on 06/10/2015    . ibuprofen (ADVIL,MOTRIN) 600 MG tablet Take 600 mg by mouth every 12 (twelve) hours.    . insulin lispro protamine-lispro (HUMALOG 75/25 MIX) (75-25) 100 UNIT/ML SUSP injection Inject 60-68 Units into the skin 2 (two) times daily with a meal. Inject 60-68 units into the skin 2 times daily, and 10 ml in the middle of the day    . lidocaine-prilocaine (EMLA) cream Apply 1 application topically as needed. 30 g 6  . losartan (COZAAR) 50 MG tablet Take 50 mg by mouth every morning.    . metFORMIN (GLUCOPHAGE) 500 MG tablet Take 1,000 mg by mouth 2 (two) times daily with a meal.     . Multiple Vitamin (MULTIVITAMIN WITH MINERALS) TABS tablet Take 1 tablet by mouth at bedtime.    . palbociclib (IBRANCE) 125 MG capsule Take 1 capsule (125 mg total) by mouth daily with breakfast. whole with food. Take for 21 days, then take 7 days off. 21 capsule 0  . simvastatin (ZOCOR) 10 MG tablet Take 20 mg by mouth every evening.     Marland Kitchen tiZANidine (ZANAFLEX) 2 MG tablet Take 2 mg by mouth at bedtime as needed for muscle spasms. Reported on 06/10/2015     No current facility-administered medications for this visit.   Facility-Administered Medications Ordered in Other Visits  Medication Dose Route Frequency Provider Last Rate Last Dose  . fulvestrant (FASLODEX) injection 500 mg  500 mg Intramuscular Q14 Days Nicholas Lose, MD   500 mg at 09/12/15 1452  . sodium chloride 0.9 % injection 10 mL  10 mL Intravenous PRN Nicholas Lose, MD   10 mL at 09/12/15 1451    PHYSICAL EXAMINATION: ECOG PERFORMANCE STATUS: 1 - Symptomatic but completely ambulatory  Filed Vitals:   09/12/15 1514  BP: 143/57  Pulse: 81  Temp: 98.2 F  (36.8 C)  Resp: 19   Filed Weights   09/12/15 1514  Weight: 304 lb 1.6 oz (137.939 kg)    GENERAL:alert, no distress and comfortable SKIN: skin color, texture, turgor are normal, no rashes or significant lesions EYES: normal, Conjunctiva are pink and non-injected, sclera clear OROPHARYNX:no exudate, no erythema and lips, buccal mucosa, and tongue normal  NECK: supple, thyroid normal size, non-tender, without nodularity LYMPH:  no palpable lymphadenopathy in the cervical, axillary or inguinal LUNGS: clear to auscultation and percussion with normal breathing effort HEART: regular rate & rhythm and no murmurs and no lower extremity edema ABDOMEN:abdomen soft, non-tender and normal bowel sounds MUSCULOSKELETAL:no cyanosis of digits and no clubbing  NEURO: alert & oriented x 3 with fluent speech, no focal motor/sensory deficits EXTREMITIES: No lower extremity edema  LABORATORY DATA:  I have reviewed the data as listed   Chemistry      Component Value Date/Time   NA 140 08/26/2015 1428   NA 137 09/23/2014 0458   K 4.4 08/26/2015 1428   K 4.3 09/23/2014 0458   CL 101 09/23/2014 0458  CO2 25 08/26/2015 1428   CO2 28 09/23/2014 0458   BUN 20.1 08/26/2015 1428   BUN 14 09/23/2014 0458   CREATININE 0.8 08/26/2015 1428   CREATININE 0.85 09/23/2014 0458      Component Value Date/Time   CALCIUM 9.5 08/26/2015 1428   CALCIUM 8.9 09/23/2014 0458   ALKPHOS 77 08/26/2015 1428   ALKPHOS 74 08/01/2010 1746   AST 12 08/26/2015 1428   AST 26 08/01/2010 1746   ALT 9 08/26/2015 1428   ALT 20 08/01/2010 1746   BILITOT <0.30 08/26/2015 1428   BILITOT 0.4 08/01/2010 1746     Lab Results  Component Value Date   WBC 3.1* 09/12/2015   HGB 12.1 09/12/2015   HCT 37.3 09/12/2015   MCV 85.7 09/12/2015   PLT 232 09/12/2015   NEUTROABS 1.4* 09/12/2015   ASSESSMENT & PLAN:  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 Followed by 12 cycles of weekly Abraxane completed 07/19/2014 S/P Mastectomy 09/22/14: Bilateral mastectomies: Right: IDC Grade 2, 2.7cm, LVI, PNI, 6/8 LN Positive, Er 90%, PR 5% T2N2 (Stage IIIA) Nonhealing right breast wound: finally healed Adjuvant radiation therapy started 03/22/2015 to complete 05/11/2015  Treatment Summary: antiestrogen therapy with anastrozole 1 mg daily Jan 2017- April 2017 (stopped when she got diagnosed with metastatic disease) Anastrozole toxicities: Hot flashes for which she is prescribed gabapentin 300 twice a day  PET/CT scan 08/08/2015: Multifocal areas of increased hypermetabolic is in new/progressive but without definite underlying lesion on CT scan suspicious for metastases, T5, T7, left sacrum, left iliac bone; 13 mm gastrohepatic node SUV 5.2 Suspicious for nodal metastases. ----------------------------------------------------------------------------------------------------------------------------------------------------------------- Met diseaseTreatment plan:Ibrance with Faslodex. Started 08/12/2015 ; today cycle 2 Ibrance toxicities:  1. Mild fatigue 2. Mild nausea Patient got a grant to pay for [employer]. Blood work today was reviewed [place] is 1500. we will keep the Ibrance dose the same for cycle 2  Bone metastases:I also recommended bisphosphonate therapy because of bone involvement. However patient has a tooth that needs to be extracted. We will await the tooth extraction before initiating bisphosphonate therapy.  She could be receiving XGeva once every 3 months.  Return to clinic in 1 month for blood work and follow-up  No orders of the defined types were placed in this encounter.   The patient has a good understanding of the overall plan. she agrees with it. she will call with any problems that may  develop before the next visit here.   Rulon Eisenmenger, MD 09/12/2015

## 2015-09-12 NOTE — Assessment & Plan Note (Signed)
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 Followed by 12 cycles of weekly Abraxane completed 07/19/2014 S/P Mastectomy 09/22/14: Bilateral mastectomies: Right: IDC Grade 2, 2.7cm, LVI, PNI, 6/8 LN Positive, Er 90%, PR 5% T2N2 (Stage IIIA) Nonhealing right breast wound: finally healed Adjuvant radiation therapy started 03/22/2015 to complete 05/11/2015  Treatment Summary: antiestrogen therapy with anastrozole 1 mg daily Jan 2017- April 2017 (stopped when she got diagnosed with metastatic disease) Anastrozole toxicities: Hot flashes for which she is prescribed gabapentin 300 twice a day  PET/CT scan 08/08/2015: Multifocal areas of increased hypermetabolic is in new/progressive but without definite underlying lesion on CT scan suspicious for metastases, T5, T7, left sacrum, left iliac bone; 13 mm gastrohepatic node SUV 5.2 Suspicious for nodal metastases. ----------------------------------------------------------------------------------------------------------------------------------------------------------------- Met diseaseTreatment plan:Ibrance with Faslodex. Started 08/12/2015 ; today cycle 2 Ibrance toxicities:  1. Mild fatigue 2. Mild nausea Patient got a grant to pay for [employer]. Blood work today was reviewed and it does not show any evidence of neutropenia.  Bone metastases:I also recommended bisphosphonate therapy because of bone involvement. However patient has a tooth that needs to be extracted. We will await the tooth extraction before initiating bisphosphonate therapy.  She could be receiving XGeva once every 3 months.  Return to clinic in 2 weeks for blood work and follow-up

## 2015-09-12 NOTE — Patient Instructions (Signed)

## 2015-09-12 NOTE — Telephone Encounter (Signed)
appt made and avs printed °

## 2015-09-30 NOTE — Progress Notes (Unsigned)
Received a call from Ms Devory Dornbusch inquiring about her [place] for her Leslee Home.  She informed me she had received $5000 and was using this at [facility] to fill her Ibrance.  She wondered 1) how much foundation money was still available 2) would it be cheaper using [facility] to home deliver her Ibrance.  I contacted [facility] and it was determined her first copay was $2408.24 and her subsequent refills will be 5% an estimated cost of $560.00.  I called Ms Lahrman back and gave her this information and advised her to contact [facility] to determine their copay as well and compare the two benefits.  Should she need further foundation copay assistance I asked her to keep in touch with [name] and around Sept 2017 reevaluate her [place] expenses.  She informed me she feels good, her ANC was down slightly so she does not know if Dr Lindi Adie will maintain the current dose or decrease it, as well as her blood sugars are much better and her insulin requirement has gone down, which makes her happy.  We will keep in touch with Ms Hannasch and I advised her to give [name] a call anytime she needs questions answered or to check on her status.  Thank You  Henreitta Leber, PharmD [facility]

## 2015-10-10 ENCOUNTER — Telehealth: Payer: Self-pay | Admitting: Hematology and Oncology

## 2015-10-10 ENCOUNTER — Encounter: Payer: Self-pay | Admitting: Hematology and Oncology

## 2015-10-10 ENCOUNTER — Ambulatory Visit (HOSPITAL_BASED_OUTPATIENT_CLINIC_OR_DEPARTMENT_OTHER): Payer: Medicare Other

## 2015-10-10 ENCOUNTER — Ambulatory Visit (HOSPITAL_BASED_OUTPATIENT_CLINIC_OR_DEPARTMENT_OTHER): Payer: Medicare Other | Admitting: Hematology and Oncology

## 2015-10-10 ENCOUNTER — Other Ambulatory Visit (HOSPITAL_BASED_OUTPATIENT_CLINIC_OR_DEPARTMENT_OTHER): Payer: Medicare Other

## 2015-10-10 VITALS — BP 127/62 | HR 87 | Temp 98.6°F | Resp 19 | Ht 69.0 in | Wt 302.8 lb

## 2015-10-10 DIAGNOSIS — Z5111 Encounter for antineoplastic chemotherapy: Secondary | ICD-10-CM

## 2015-10-10 DIAGNOSIS — C773 Secondary and unspecified malignant neoplasm of axilla and upper limb lymph nodes: Secondary | ICD-10-CM

## 2015-10-10 DIAGNOSIS — C50411 Malignant neoplasm of upper-outer quadrant of right female breast: Secondary | ICD-10-CM

## 2015-10-10 DIAGNOSIS — C7951 Secondary malignant neoplasm of bone: Secondary | ICD-10-CM

## 2015-10-10 DIAGNOSIS — Z95828 Presence of other vascular implants and grafts: Secondary | ICD-10-CM

## 2015-10-10 DIAGNOSIS — Z452 Encounter for adjustment and management of vascular access device: Secondary | ICD-10-CM | POA: Diagnosis not present

## 2015-10-10 DIAGNOSIS — Z170 Estrogen receptor positive status [ER+]: Secondary | ICD-10-CM

## 2015-10-10 LAB — CBC WITH DIFFERENTIAL/PLATELET
BASO%: 2.6 % — AB (ref 0.0–2.0)
Basophils Absolute: 0.1 10*3/uL (ref 0.0–0.1)
EOS ABS: 0 10*3/uL (ref 0.0–0.5)
EOS%: 1.5 % (ref 0.0–7.0)
HCT: 36.2 % (ref 34.8–46.6)
HEMOGLOBIN: 12 g/dL (ref 11.6–15.9)
LYMPH%: 32.5 % (ref 14.0–49.7)
MCH: 29.3 pg (ref 25.1–34.0)
MCHC: 33.1 g/dL (ref 31.5–36.0)
MCV: 88.5 fL (ref 79.5–101.0)
MONO#: 0.3 10*3/uL (ref 0.1–0.9)
MONO%: 11.7 % (ref 0.0–14.0)
NEUT%: 51.7 % (ref 38.4–76.8)
NEUTROS ABS: 1.5 10*3/uL (ref 1.5–6.5)
Platelets: 170 10*3/uL (ref 145–400)
RBC: 4.09 10*6/uL (ref 3.70–5.45)
RDW: 24.1 % — AB (ref 11.2–14.5)
WBC: 2.9 10*3/uL — AB (ref 3.9–10.3)
lymph#: 1 10*3/uL (ref 0.9–3.3)

## 2015-10-10 LAB — COMPREHENSIVE METABOLIC PANEL
ALBUMIN: 3.7 g/dL (ref 3.5–5.0)
ALK PHOS: 75 U/L (ref 40–150)
ALT: 11 U/L (ref 0–55)
AST: 11 U/L (ref 5–34)
Anion Gap: 9 mEq/L (ref 3–11)
BILIRUBIN TOTAL: 0.34 mg/dL (ref 0.20–1.20)
BUN: 15.3 mg/dL (ref 7.0–26.0)
CO2: 26 mEq/L (ref 22–29)
CREATININE: 0.8 mg/dL (ref 0.6–1.1)
Calcium: 9.5 mg/dL (ref 8.4–10.4)
Chloride: 105 mEq/L (ref 98–109)
EGFR: 79 mL/min/{1.73_m2} — AB (ref >90)
GLUCOSE: 146 mg/dL — AB (ref 70–140)
Potassium: 4.2 mEq/L (ref 3.5–5.1)
SODIUM: 140 meq/L (ref 136–145)
TOTAL PROTEIN: 6.9 g/dL (ref 6.4–8.3)

## 2015-10-10 MED ORDER — HEPARIN SOD (PORK) LOCK FLUSH 100 UNIT/ML IV SOLN
500.0000 [IU] | Freq: Once | INTRAVENOUS | Status: AC
  Administered 2015-10-10: 500 [IU] via INTRAVENOUS
  Filled 2015-10-10: qty 5

## 2015-10-10 MED ORDER — ALTEPLASE 2 MG IJ SOLR
2.0000 mg | Freq: Once | INTRAMUSCULAR | Status: DC | PRN
  Filled 2015-10-10: qty 2

## 2015-10-10 MED ORDER — SODIUM CHLORIDE 0.9% FLUSH
10.0000 mL | INTRAVENOUS | Status: DC | PRN
  Administered 2015-10-10: 10 mL via INTRAVENOUS
  Filled 2015-10-10: qty 10

## 2015-10-10 MED ORDER — FULVESTRANT 250 MG/5ML IM SOLN
500.0000 mg | INTRAMUSCULAR | Status: DC
  Administered 2015-10-10: 500 mg via INTRAMUSCULAR
  Filled 2015-10-10: qty 10

## 2015-10-10 NOTE — Telephone Encounter (Signed)
appt made and avs printed. CT to be scheduled by central radiology °

## 2015-10-10 NOTE — Assessment & Plan Note (Signed)
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 Followed by 12 cycles of weekly Abraxane completed 07/19/2014 S/P Mastectomy 09/22/14: Bilateral mastectomies: Right: IDC Grade 2, 2.7cm, LVI, PNI, 6/8 LN Positive, Er 90%, PR 5% T2N2 (Stage IIIA) Nonhealing right breast wound: finally healed Adjuvant radiation therapy started 03/22/2015 to complete 05/11/2015  Treatment Summary: antiestrogen therapy with anastrozole 1 mg daily Jan 2017- April 2017 (stopped when she got diagnosed with metastatic disease) Anastrozole toxicities: Hot flashes for which she is prescribed gabapentin 300 twice a day  PET/CT scan 08/08/2015: Multifocal areas of increased hypermetabolic is in new/progressive but without definite underlying lesion on CT scan suspicious for metastases, T5, T7, left sacrum, left iliac bone; 13 mm gastrohepatic node SUV 5.2 Suspicious for nodal metastases. ----------------------------------------------------------------------------------------------------------------------------------------------------------------- Met diseaseTreatment plan:Ibrance with Faslodex. Started 08/12/2015 ; today cycle 2 Ibrance toxicities:  1. Mild fatigue 2. Mild nausea Patient got a grant to pay for [employer]. Blood work today was reviewed [place] is 1500. we will keep the Ibrance dose the same for cycle 2  Bone metastases XGeva once every 3 months.  Return to clinic in 1 month for blood work and follow-up

## 2015-10-10 NOTE — Progress Notes (Signed)
Patient saw Dr Lindi Adie for follow-up.  Will continue Ibrance at current dose of 125 mg po daily.  Patients labs appropriate for treatment with no side effects noted. WBC: 2.9 ANC 1.5 Hgb 12 HCt 3632 Plt 170 Patient to go on vacation in July with next treatment cycle. No need for further intervention at this time.  Henreitta Leber, PharmD [facility]

## 2015-10-10 NOTE — Progress Notes (Signed)
Patient Care Team: Jacelyn Pi, MD as PCP - General (Endocrinology)  DIAGNOSIS: Breast cancer of upper-outer quadrant of right female breast [facility])   Staging form: Breast, AJCC 7th Edition     Clinical: Stage IIIA (T3, N1, M1) - Signed by Rulon Eisenmenger, MD on 03/05/2014     Pathologic: T3, N1a, M1 - Unsigned   SUMMARY OF ONCOLOGIC HISTORY:   Breast cancer of upper-outer quadrant of right female breast ([place])   02/02/2014 Mammogram Right breast: 5.8 cm irregular high-density solid mass suggestive of malignancy; left breast next millimeter internal mammary lymph node   02/04/2014 Initial Biopsy  2 biopsies were performed the right breast and axilla: Grade 2 invasive ductal carcinoma ER/PR positive HER-2 negative Ki-67 20% to 47%: Biopsy of right humerus metastatic breast cancer estrogen positive   02/17/2014 PET scan Hypermetabolic right breast cancer and right axillary lymph nodes subpectoral lymph nodes indeterminate right middle lobe pulmonary nodule and hypermetabolic proximal right humerus lesion   03/05/2014 - 07/19/2014 Neo-Adjuvant Chemotherapy Dose dense Adriamycin and Cytoxan x4 cycles followed by Abraxane weekly x12   07/26/2014 Breast MRI Multifocal breast cancer decreased in size, retroareolar 13 mm now 11 mm, middle third 22 mm now 18 mm, 3.9 x 3.9 cm now 3.9 x 2.6 cm, multiple smaller nodules decreased in size, right x-ray lymph node 3.6 cm now 2 cm other lymph nodes are smaller   07/28/2014 - 08/12/2015 Anti-estrogen oral therapy Anastrozole 1 mg daily   08/02/2014 PET scan Interval improvement in the right breast mass and axillary/subpectoral lymph nodes and right humerus head. Several subpleural nodules are not visible, 3.4. cm adrenal adenoma   09/22/2014 Surgery Bilateral mastectomy: Right: IDC Grade 2, 2.7cm, LVI, PNI, 6/8 LN Positive, Er 90%, PR 5% T2N2   03/22/2015 -  Radiation Therapy adjuvant radiation therapy with Dr. Isidore Moos   08/10/2015 PET scan Progression of disease with  several new bone metastases, T5, T7, left sacrum, left iliac bone, gastrohepatic lymph node   08/12/2015 -  Anti-estrogen oral therapy Faslodex with Leslee Home    CHIEF COMPLIANT: Follow-up on Faslodex with Ibrance  INTERVAL HISTORY: Tonya Alvarez is a 70 year old with above-mentioned history metastatic breast cancer currently on Faslodex with Ibrance. She appears to be tolerating Ibrance fairly well. She does have mild fatigue and occasional hot flashes. Denies any nausea or vomiting. Denies myalgias.  REVIEW OF SYSTEMS:   Constitutional: Denies fevers, chills or abnormal weight loss Eyes: Denies blurriness of vision Ears, nose, mouth, throat, and face: Denies mucositis or sore throat Respiratory: Denies cough, dyspnea or wheezes Cardiovascular: Denies palpitation, chest discomfort Gastrointestinal:  Denies nausea, heartburn or change in bowel habits Skin: Denies abnormal skin rashes Lymphatics: Denies new lymphadenopathy or easy bruising Neurological:Denies numbness, tingling or new weaknesses Behavioral/Psych: Mood is stable, no new changes  Extremities: No lower extremity edema Breast:  denies any pain or lumps or nodules in either breasts All other systems were reviewed with the patient and are negative.  I have reviewed the past medical history, past surgical history, social history and family history with the patient and they are unchanged from previous note.  ALLERGIES:  is allergic to amoxicillin; hydrocodone; and lisinopril.  MEDICATIONS:  Current Outpatient Prescriptions  Medication Sig Dispense Refill  . acetaminophen (TYLENOL) 500 MG tablet Take 500 mg by mouth every 6 (six) hours as needed.    Marland Kitchen anastrozole (ARIMIDEX) 1 MG tablet Take 1 tablet by mouth  daily 90 tablet 3  . aspirin 81 MG tablet  Take 81 mg by mouth at bedtime.     . calcium-vitamin D (OSCAL WITH D) 500-200 MG-UNIT per tablet Take 1 tablet by mouth daily.    . fluticasone (FLONASE) 50 MCG/ACT nasal spray  Place 1 spray into both nostrils daily as needed for allergies or rhinitis. 16 g 0  . gabapentin (NEURONTIN) 300 MG capsule Take 300 mg by mouth 2 (two) times daily.    . hyaluronate sodium (RADIAPLEXRX) GEL Apply 1 application topically once. Reported on 06/10/2015    . ibuprofen (ADVIL,MOTRIN) 600 MG tablet Take 600 mg by mouth every 12 (twelve) hours.    . insulin lispro protamine-lispro (HUMALOG 75/25 MIX) (75-25) 100 UNIT/ML SUSP injection Inject 60-68 Units into the skin 2 (two) times daily with a meal. Inject 60-68 units into the skin 2 times daily, and 10 ml in the middle of the day    . lidocaine-prilocaine (EMLA) cream Apply 1 application topically as needed. 30 g 6  . losartan (COZAAR) 50 MG tablet Take 50 mg by mouth every morning.    . metFORMIN (GLUCOPHAGE) 500 MG tablet Take 1,000 mg by mouth 2 (two) times daily with a meal.     . Multiple Vitamin (MULTIVITAMIN WITH MINERALS) TABS tablet Take 1 tablet by mouth at bedtime.    . palbociclib (IBRANCE) 125 MG capsule Take 1 capsule (125 mg total) by mouth daily with breakfast. whole with food. Take for 21 days, then take 7 days off. 21 capsule 0  . simvastatin (ZOCOR) 10 MG tablet Take 20 mg by mouth every evening.     Marland Kitchen tiZANidine (ZANAFLEX) 2 MG tablet Take 2 mg by mouth at bedtime as needed for muscle spasms. Reported on 06/10/2015     No current facility-administered medications for this visit.    PHYSICAL EXAMINATION: ECOG PERFORMANCE STATUS: 1 - Symptomatic but completely ambulatory  Filed Vitals:   10/10/15 1445  BP: 127/62  Pulse: 87  Temp: 98.6 F (37 C)  Resp: 19   Filed Weights   10/10/15 1445  Weight: 302 lb 12.8 oz (137.349 kg)    GENERAL:alert, no distress and comfortable SKIN: skin color, texture, turgor are normal, no rashes or significant lesions EYES: normal, Conjunctiva are pink and non-injected, sclera clear OROPHARYNX:no exudate, no erythema and lips, buccal mucosa, and tongue normal  NECK: supple,  thyroid normal size, non-tender, without nodularity LYMPH:  no palpable lymphadenopathy in the cervical, axillary or inguinal LUNGS: clear to auscultation and percussion with normal breathing effort HEART: regular rate & rhythm and no murmurs and no lower extremity edema ABDOMEN:abdomen soft, non-tender and normal bowel sounds MUSCULOSKELETAL:no cyanosis of digits and no clubbing  NEURO: alert & oriented x 3 with fluent speech, no focal motor/sensory deficits EXTREMITIES: No lower extremity edema  LABORATORY DATA:  I have reviewed the data as listed   Chemistry      Component Value Date/Time   NA 139 09/12/2015 1435   NA 137 09/23/2014 0458   K 4.1 09/12/2015 1435   K 4.3 09/23/2014 0458   CL 101 09/23/2014 0458   CO2 26 09/12/2015 1435   CO2 28 09/23/2014 0458   BUN 17.4 09/12/2015 1435   BUN 14 09/23/2014 0458   CREATININE 0.7 09/12/2015 1435   CREATININE 0.85 09/23/2014 0458      Component Value Date/Time   CALCIUM 9.6 09/12/2015 1435   CALCIUM 8.9 09/23/2014 0458   ALKPHOS 75 09/12/2015 1435   ALKPHOS 74 08/01/2010 1746   AST 11 09/12/2015 1435  AST 26 08/01/2010 1746   ALT 15 09/12/2015 1435   ALT 20 08/01/2010 1746   BILITOT <0.30 09/12/2015 1435   BILITOT 0.4 08/01/2010 1746       Lab Results  Component Value Date   WBC 2.9* 10/10/2015   HGB 12.0 10/10/2015   HCT 36.2 10/10/2015   MCV 88.5 10/10/2015   PLT 170 10/10/2015   NEUTROABS 1.5 10/10/2015     ASSESSMENT & PLAN:  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 Followed by 12 cycles of weekly Abraxane completed 07/19/2014 S/P Mastectomy 09/22/14: Bilateral mastectomies: Right: IDC Grade 2, 2.7cm, LVI, PNI, 6/8 LN Positive, Er 90%, PR 5% T2N2 (Stage IIIA) Nonhealing right breast  wound: finally healed Adjuvant radiation therapy started 03/22/2015 to complete 05/11/2015  Treatment Summary: antiestrogen therapy with anastrozole 1 mg daily Jan 2017- April 2017 (stopped when she got diagnosed with metastatic disease) Anastrozole toxicities: Hot flashes for which she is prescribed gabapentin 300 twice a day  PET/CT scan 08/08/2015: Multifocal areas of increased hypermetabolic is in new/progressive but without definite underlying lesion on CT scan suspicious for metastases, T5, T7, left sacrum, left iliac bone; 13 mm gastrohepatic node SUV 5.2 Suspicious for nodal metastases. ----------------------------------------------------------------------------------------------------------------------------------------------------------------- Met diseaseTreatment plan:Ibrance with Faslodex. Started 08/12/2015 ; today cycle 2 Ibrance toxicities:  1. Mild fatigue 2. Mild nausea Patient got a grant to pay for [employer]. Blood work today was reviewed [place] is 1500. we will keep the Ibrance dose the same for cycle 2  Bone metastases XGeva once every 3 months once she completes her dental work which is scheduled for next week. I will obtain a CT of her chest abdomen pelvis with contrast and follow-up in one month. Patient plans to go to a beach vacation from July 4 along with her family to [place].  Return to clinic in 1 month for blood work and follow-up   Orders Placed This Encounter  Procedures  . CT Abdomen Pelvis W Contrast    Standing Status: Future     Number of Occurrences:      Standing Expiration Date: 10/09/2016    Order Specific Question:  If indicated for the ordered procedure, I authorize the administration of contrast media per Radiology protocol    Answer:  Yes    Order Specific Question:  Reason for Exam (SYMPTOM  OR DIAGNOSIS REQUIRED)    Answer:  met Breast cancer restaging    Order Specific Question:  Preferred imaging location?    Answer:  [facility]  . CT Chest W Contrast    Standing Status: Future     Number of Occurrences:      Standing Expiration Date: 10/09/2016    Order Specific Question:  If indicated for the ordered procedure, I authorize the administration of contrast media per Radiology protocol    Answer:  Yes    Order Specific Question:  Reason for Exam (SYMPTOM  OR DIAGNOSIS REQUIRED)    Answer:  Restaging Met Breast cancer    Order Specific Question:  Preferred imaging location?    Answer:  [facility]   The patient has a good understanding of the overall plan. she agrees with it. she will call with any problems that may develop before the next visit here.   Rulon Eisenmenger, MD  10/10/2015     

## 2015-10-10 NOTE — Patient Instructions (Signed)

## 2015-10-11 ENCOUNTER — Other Ambulatory Visit: Payer: Self-pay | Admitting: Hematology and Oncology

## 2015-10-11 ENCOUNTER — Telehealth: Payer: Self-pay | Admitting: Pharmacist

## 2015-10-11 MED FILL — *IBRANCE 125 MG CAPSULE: 125 | 28 days supply | Qty: 21 | Fill #0

## 2015-10-11 NOTE — Telephone Encounter (Signed)
Pt called [facility] requesting Ibrance.  She would like to p/u today at [facility]. Pt needs refill.  Routed request to Dr. Lindi Adie and left vm for desk RN. I s/w pt over phone.  She is tol med fine just increased fatigue this cycle.  No N/V.  No bleeding/bruising. Kennith Center, Pharm.D., CPP 10/11/2015@10 :28 AM  [facility]

## 2015-10-11 NOTE — Telephone Encounter (Signed)
Received message from oral chemo pharmacy stating pt requesting refill for oral chemo.  Reviewed labs and last office note and refill deemed appropriate.  Prescription called in to [facility] per pt request.  Spoke with pt to let her know this prescription has been sent.  No further questions at time of call.

## 2015-10-17 DIAGNOSIS — E7800 Pure hypercholesterolemia, unspecified: Secondary | ICD-10-CM | POA: Diagnosis not present

## 2015-10-17 DIAGNOSIS — I10 Essential (primary) hypertension: Secondary | ICD-10-CM | POA: Diagnosis not present

## 2015-10-17 DIAGNOSIS — E1165 Type 2 diabetes mellitus with hyperglycemia: Secondary | ICD-10-CM | POA: Diagnosis not present

## 2015-10-20 DIAGNOSIS — C50911 Malignant neoplasm of unspecified site of right female breast: Secondary | ICD-10-CM | POA: Diagnosis not present

## 2015-10-22 ENCOUNTER — Emergency Department (HOSPITAL_COMMUNITY): Payer: Medicare Other

## 2015-10-22 ENCOUNTER — Other Ambulatory Visit: Payer: Self-pay

## 2015-10-22 ENCOUNTER — Inpatient Hospital Stay (HOSPITAL_COMMUNITY)
Admission: EM | Admit: 2015-10-22 | Discharge: 2015-10-26 | DRG: 175 | Disposition: A | Discharge status: Home / Self Care | Payer: Medicare Other | Attending: Internal Medicine | Admitting: Internal Medicine

## 2015-10-22 ENCOUNTER — Encounter (HOSPITAL_COMMUNITY): Payer: Self-pay | Admitting: Emergency Medicine

## 2015-10-22 DIAGNOSIS — Z794 Long term (current) use of insulin: Secondary | ICD-10-CM

## 2015-10-22 DIAGNOSIS — R0602 Shortness of breath: Secondary | ICD-10-CM | POA: Diagnosis not present

## 2015-10-22 DIAGNOSIS — Z853 Personal history of malignant neoplasm of breast: Secondary | ICD-10-CM

## 2015-10-22 DIAGNOSIS — C50411 Malignant neoplasm of upper-outer quadrant of right female breast: Secondary | ICD-10-CM | POA: Diagnosis not present

## 2015-10-22 DIAGNOSIS — J9601 Acute respiratory failure with hypoxia: Secondary | ICD-10-CM | POA: Diagnosis not present

## 2015-10-22 DIAGNOSIS — R0902 Hypoxemia: Secondary | ICD-10-CM | POA: Diagnosis not present

## 2015-10-22 DIAGNOSIS — G8929 Other chronic pain: Secondary | ICD-10-CM | POA: Diagnosis not present

## 2015-10-22 DIAGNOSIS — E1142 Type 2 diabetes mellitus with diabetic polyneuropathy: Secondary | ICD-10-CM | POA: Diagnosis present

## 2015-10-22 DIAGNOSIS — Z79899 Other long term (current) drug therapy: Secondary | ICD-10-CM

## 2015-10-22 DIAGNOSIS — Z880 Allergy status to penicillin: Secondary | ICD-10-CM | POA: Diagnosis not present

## 2015-10-22 DIAGNOSIS — Z95828 Presence of other vascular implants and grafts: Secondary | ICD-10-CM

## 2015-10-22 DIAGNOSIS — Z96652 Presence of left artificial knee joint: Secondary | ICD-10-CM | POA: Diagnosis not present

## 2015-10-22 DIAGNOSIS — I2699 Other pulmonary embolism without acute cor pulmonale: Secondary | ICD-10-CM | POA: Diagnosis not present

## 2015-10-22 DIAGNOSIS — E785 Hyperlipidemia, unspecified: Secondary | ICD-10-CM | POA: Diagnosis present

## 2015-10-22 DIAGNOSIS — C7951 Secondary malignant neoplasm of bone: Secondary | ICD-10-CM | POA: Diagnosis present

## 2015-10-22 DIAGNOSIS — E119 Type 2 diabetes mellitus without complications: Secondary | ICD-10-CM | POA: Diagnosis not present

## 2015-10-22 DIAGNOSIS — R079 Chest pain, unspecified: Secondary | ICD-10-CM | POA: Diagnosis not present

## 2015-10-22 DIAGNOSIS — I82401 Acute embolism and thrombosis of unspecified deep veins of right lower extremity: Secondary | ICD-10-CM | POA: Diagnosis not present

## 2015-10-22 DIAGNOSIS — E109 Type 1 diabetes mellitus without complications: Secondary | ICD-10-CM | POA: Diagnosis not present

## 2015-10-22 DIAGNOSIS — Q211 Atrial septal defect: Secondary | ICD-10-CM

## 2015-10-22 DIAGNOSIS — Z0000 Encounter for general adult medical examination without abnormal findings: Secondary | ICD-10-CM | POA: Diagnosis not present

## 2015-10-22 DIAGNOSIS — Z9013 Acquired absence of bilateral breasts and nipples: Secondary | ICD-10-CM

## 2015-10-22 DIAGNOSIS — I10 Essential (primary) hypertension: Secondary | ICD-10-CM | POA: Diagnosis present

## 2015-10-22 DIAGNOSIS — Z885 Allergy status to narcotic agent status: Secondary | ICD-10-CM

## 2015-10-22 DIAGNOSIS — Z888 Allergy status to other drugs, medicaments and biological substances status: Secondary | ICD-10-CM | POA: Diagnosis not present

## 2015-10-22 DIAGNOSIS — I2602 Saddle embolus of pulmonary artery with acute cor pulmonale: Secondary | ICD-10-CM | POA: Diagnosis not present

## 2015-10-22 DIAGNOSIS — R000 Tachycardia, unspecified: Secondary | ICD-10-CM | POA: Diagnosis not present

## 2015-10-22 DIAGNOSIS — Z87891 Personal history of nicotine dependence: Secondary | ICD-10-CM

## 2015-10-22 DIAGNOSIS — D649 Anemia, unspecified: Secondary | ICD-10-CM | POA: Diagnosis present

## 2015-10-22 DIAGNOSIS — Z7982 Long term (current) use of aspirin: Secondary | ICD-10-CM

## 2015-10-22 DIAGNOSIS — R7989 Other specified abnormal findings of blood chemistry: Secondary | ICD-10-CM | POA: Diagnosis not present

## 2015-10-22 DIAGNOSIS — D3501 Benign neoplasm of right adrenal gland: Secondary | ICD-10-CM | POA: Diagnosis not present

## 2015-10-22 LAB — BASIC METABOLIC PANEL
Anion gap: 8 (ref 5–15)
BUN: 18 mg/dL (ref 6–20)
CHLORIDE: 105 mmol/L (ref 101–111)
CO2: 24 mmol/L (ref 22–32)
CREATININE: 0.96 mg/dL (ref 0.44–1.00)
Calcium: 9.1 mg/dL (ref 8.9–10.3)
GFR calc Af Amer: >60 mL/min (ref >60)
GFR calc non Af Amer: 60 mL/min — ABNORMAL LOW (ref >60)
GLUCOSE: 170 mg/dL — AB (ref 65–99)
Potassium: 4.6 mmol/L (ref 3.5–5.1)
SODIUM: 137 mmol/L (ref 135–145)

## 2015-10-22 LAB — CBC
HCT: 34.4 % — ABNORMAL LOW (ref 36.0–46.0)
Hemoglobin: 11.2 g/dL — ABNORMAL LOW (ref 12.0–15.0)
MCH: 28.9 pg (ref 26.0–34.0)
MCHC: 32.6 g/dL (ref 30.0–36.0)
MCV: 88.9 fL (ref 78.0–100.0)
PLATELETS: 218 10*3/uL (ref 150–400)
RBC: 3.87 MIL/uL (ref 3.87–5.11)
RDW: 21.2 % — AB (ref 11.5–15.5)
WBC: 3.5 10*3/uL — ABNORMAL LOW (ref 4.0–10.5)

## 2015-10-22 LAB — I-STAT TROPONIN, ED
Troponin i, poc: 0.02 ng/mL (ref 0.00–0.08)
Troponin i, poc: 0.15 ng/mL (ref 0.00–0.08)

## 2015-10-22 LAB — PROTIME-INR
INR: 1.19 (ref 0.00–1.49)
Prothrombin Time: 15.2 seconds (ref 11.6–15.2)

## 2015-10-22 LAB — TROPONIN I: TROPONIN I: 0.16 ng/mL — AB (ref <0.031)

## 2015-10-22 LAB — APTT: APTT: 114 s — AB (ref 24–37)

## 2015-10-22 LAB — GLUCOSE, CAPILLARY: Glucose-Capillary: 124 mg/dL — ABNORMAL HIGH (ref 65–99)

## 2015-10-22 LAB — BRAIN NATRIURETIC PEPTIDE: B Natriuretic Peptide: 119.6 pg/mL — ABNORMAL HIGH (ref 0.0–100.0)

## 2015-10-22 MED ORDER — GABAPENTIN 300 MG PO CAPS
300.0000 mg | ORAL_CAPSULE | Freq: Three times a day (TID) | ORAL | Status: DC
  Administered 2015-10-22 – 2015-10-26 (×12): 300 mg via ORAL
  Filled 2015-10-22 (×12): qty 1

## 2015-10-22 MED ORDER — INSULIN ASPART 100 UNIT/ML ~~LOC~~ SOLN
0.0000 [IU] | Freq: Three times a day (TID) | SUBCUTANEOUS | Status: DC
  Administered 2015-10-23: 3 [IU] via SUBCUTANEOUS
  Administered 2015-10-23: 2 [IU] via SUBCUTANEOUS
  Administered 2015-10-24: 1 [IU] via SUBCUTANEOUS
  Administered 2015-10-24: 2 [IU] via SUBCUTANEOUS
  Administered 2015-10-24 – 2015-10-25 (×2): 1 [IU] via SUBCUTANEOUS
  Administered 2015-10-25: 3 [IU] via SUBCUTANEOUS
  Administered 2015-10-25: 2 [IU] via SUBCUTANEOUS
  Administered 2015-10-26 (×2): 1 [IU] via SUBCUTANEOUS

## 2015-10-22 MED ORDER — SODIUM CHLORIDE 0.9 % IV BOLUS (SEPSIS)
1000.0000 mL | Freq: Once | INTRAVENOUS | Status: AC
  Administered 2015-10-22: 1000 mL via INTRAVENOUS

## 2015-10-22 MED ORDER — ASPIRIN EC 81 MG PO TBEC
81.0000 mg | DELAYED_RELEASE_TABLET | Freq: Every day | ORAL | Status: DC
  Administered 2015-10-23 – 2015-10-25 (×3): 81 mg via ORAL
  Filled 2015-10-22 (×3): qty 1

## 2015-10-22 MED ORDER — SIMVASTATIN 20 MG PO TABS
20.0000 mg | ORAL_TABLET | Freq: Every day | ORAL | Status: DC
  Administered 2015-10-23 – 2015-10-25 (×3): 20 mg via ORAL
  Filled 2015-10-22 (×4): qty 1

## 2015-10-22 MED ORDER — ACETAMINOPHEN 650 MG RE SUPP: 650.0000 mg | Freq: Four times a day (QID) | RECTAL | Status: DC | PRN

## 2015-10-22 MED ORDER — PALBOCICLIB 125 MG PO CAPS
125.0000 mg | ORAL_CAPSULE | Freq: Every day | ORAL | Status: DC
  Administered 2015-10-23 – 2015-10-26 (×4): 125 mg via ORAL
  Filled 2015-10-22 (×5): qty 1

## 2015-10-22 MED ORDER — DM-GUAIFENESIN ER 30-600 MG PO TB12: 1.0000 | ORAL_TABLET | Freq: Two times a day (BID) | ORAL | Status: DC | PRN

## 2015-10-22 MED ORDER — HEPARIN BOLUS VIA INFUSION
5500.0000 [IU] | Freq: Once | INTRAVENOUS | Status: AC
  Administered 2015-10-22: 5500 [IU] via INTRAVENOUS
  Filled 2015-10-22: qty 5500

## 2015-10-22 MED ORDER — MORPHINE SULFATE (PF) 2 MG/ML IV SOLN: 2.0000 mg | INTRAVENOUS | Status: DC | PRN

## 2015-10-22 MED ORDER — ASPIRIN 325 MG PO TABS: 325.0000 mg | ORAL_TABLET | Freq: Once | ORAL | Status: DC

## 2015-10-22 MED ORDER — FLUTICASONE PROPIONATE 50 MCG/ACT NA SUSP
1.0000 | Freq: Every evening | NASAL | Status: DC | PRN
  Administered 2015-10-24 – 2015-10-25 (×2): 1 via NASAL
  Filled 2015-10-22: qty 16

## 2015-10-22 MED ORDER — LEVALBUTEROL HCL 0.63 MG/3ML IN NEBU
0.6300 mg | INHALATION_SOLUTION | Freq: Four times a day (QID) | RESPIRATORY_TRACT | Status: DC
  Administered 2015-10-22 – 2015-10-23 (×2): 0.63 mg via RESPIRATORY_TRACT
  Filled 2015-10-22 (×4): qty 3

## 2015-10-22 MED ORDER — SODIUM CHLORIDE 0.9% FLUSH
3.0000 mL | Freq: Two times a day (BID) | INTRAVENOUS | Status: DC
  Administered 2015-10-23 – 2015-10-26 (×6): 3 mL via INTRAVENOUS

## 2015-10-22 MED ORDER — CALCIUM CARBONATE-VITAMIN D 500-200 MG-UNIT PO TABS
1.0000 | ORAL_TABLET | Freq: Every day | ORAL | Status: DC
  Administered 2015-10-23 – 2015-10-26 (×4): 1 via ORAL
  Filled 2015-10-22 (×4): qty 1

## 2015-10-22 MED ORDER — ACETAMINOPHEN 325 MG PO TABS: 650.0000 mg | ORAL_TABLET | Freq: Four times a day (QID) | ORAL | Status: DC | PRN

## 2015-10-22 MED ORDER — IBUPROFEN 200 MG PO TABS
600.0000 mg | ORAL_TABLET | Freq: Two times a day (BID) | ORAL | Status: DC
  Administered 2015-10-22 – 2015-10-26 (×8): 600 mg via ORAL
  Filled 2015-10-22 (×8): qty 3

## 2015-10-22 MED ORDER — INSULIN ASPART PROT & ASPART (70-30 MIX) 100 UNIT/ML ~~LOC~~ SUSP
40.0000 [IU] | Freq: Two times a day (BID) | SUBCUTANEOUS | Status: DC
Start: 2015-10-23 — End: 2015-10-26
  Administered 2015-10-23 – 2015-10-25 (×5): 40 [IU] via SUBCUTANEOUS
  Filled 2015-10-22: qty 10

## 2015-10-22 MED ORDER — OXYCODONE HCL 5 MG PO TABS: 10.0000 mg | ORAL_TABLET | ORAL | Status: DC | PRN

## 2015-10-22 MED ORDER — IOPAMIDOL (ISOVUE-370) INJECTION 76%
INTRAVENOUS | Status: AC
  Filled 2015-10-22: qty 100

## 2015-10-22 MED ORDER — ADULT MULTIVITAMIN W/MINERALS CH
1.0000 | ORAL_TABLET | Freq: Every day | ORAL | Status: DC
  Administered 2015-10-22 – 2015-10-25 (×4): 1 via ORAL
  Filled 2015-10-22 (×4): qty 1

## 2015-10-22 MED ORDER — SODIUM CHLORIDE 0.9 % IV SOLN
INTRAVENOUS | Status: DC
  Administered 2015-10-22 – 2015-10-23 (×3): via INTRAVENOUS

## 2015-10-22 MED ORDER — LIDOCAINE-PRILOCAINE 2.5-2.5 % EX CREA: 1.0000 "application " | TOPICAL_CREAM | CUTANEOUS | Status: DC | PRN

## 2015-10-22 MED ORDER — ONDANSETRON HCL 4 MG/2ML IJ SOLN: 4.0000 mg | Freq: Three times a day (TID) | INTRAMUSCULAR | Status: DC | PRN

## 2015-10-22 MED ORDER — HYDRALAZINE HCL 20 MG/ML IJ SOLN: 5.0000 mg | INTRAMUSCULAR | Status: DC | PRN

## 2015-10-22 MED ORDER — HEPARIN (PORCINE) IN NACL 100-0.45 UNIT/ML-% IJ SOLN
1600.0000 [IU]/h | INTRAMUSCULAR | Status: DC
  Administered 2015-10-22: 1700 [IU]/h via INTRAVENOUS
  Administered 2015-10-23: 1600 [IU]/h via INTRAVENOUS
  Administered 2015-10-23: 1700 [IU]/h via INTRAVENOUS
  Administered 2015-10-24: 1600 [IU]/h via INTRAVENOUS
  Filled 2015-10-22 (×4): qty 250

## 2015-10-22 NOTE — H&P (Addendum)
History and Physical    Tonya Alvarez [id number] DOB: [birth year] DOA: 10/22/2015  Referring MD/NP/PA:   PCP: Jacelyn Pi, MD   Patient coming from:  The patient is coming from home.  At baseline, pt is independent for most of ADL.       Chief Complaint: Chest pain and shortness of breath  HPI: Tonya Alvarez is a 70 y.o. female with medical history significant of metastasized breast cancer (s/p of bilateral mastectomy, s/p of radiation therapy, currently on chemotherapy), hypertension, hyperlipidemia, diabetes mellitus, mitral regurgitation, chronic back pain, who presents with chest pain and SOB.  Patient reports that she started having chest pain and shortness of breath at about 10 AM. The chest pain is located in the front chest, constant, 7 out of 10 in severity, pressure-like pain, radiating to bilateral arms and jaw. It is aggravated by deep breath. Pt can speak in full sentence. Patient does not have tenderness over the calf areas. She has mild cough and sinus issue recently. No fever or chills. Patient does not have nausea, vomiting, abdominal pain, diarrhea, symptoms UTI or unilateral weakness. Initial bp was soft at 92/50, which improved to 112/58 with 1L NS.   ED Course: pt was found to have WBC 3.5, temperature normal, oxygen desaturated to 90%, tachycardia, tachypnea, electrolytes and renal function, negative chest x-ray for acute abnormalities, troponin 0.15. Pt is admitted to stepdown bed for further evaluation and treatment. PCCM will be consulted.  # CT angiogram showed massive bilateral pulmonary emboli with evidence for right heart strain, subpleural consolidation within the right upper lobe and an adjacent focal area of ground-glass attenuation which may be secondary to radiation change, infarct from pulmonary emboli or infectious/inflammatory process. Metastatic disease not favored, thought not excluded per radiologist, and T5 lytic lesion compatible with osseous  metastasis.    Review of Systems:   General: no fevers, chills, no changes in body weight, has poor appetite, has fatigue HEENT: no blurry vision, hearing changes or sore throat Pulm: has dyspnea, coughing, no wheezing CV: has chest pain, no palpitations Abd: no nausea, vomiting, abdominal pain, diarrhea, constipation GU: no dysuria, burning on urination, increased urinary frequency, hematuria  Ext: no leg edema. Neuro: no unilateral weakness, numbness, or tingling, no vision change or hearing loss Skin: no rash MSK: No muscle spasm, no deformity, no limitation of range of movement in spin Heme: No easy bruising.  Travel history: No recent long distant travel.  Allergy:  Allergies  Allergen Reactions  . Amoxicillin Nausea And Vomiting  . Hydrocodone Itching  . Lisinopril Cough    Past Medical History  Diagnosis Date  . Heart murmur     mitral valve  . Complication of anesthesia     O2 SAT  DROP   . Hypertension   . Neuromuscular disorder ([place])   . Type II diabetes mellitus ([place])   . Mitral regurgitation   . Hyperlipidemia   . Diabetic peripheral neuropathy ([place])   . Sinus headache     "seasonal"  . Arthritis     "knees, ankles" (09/22/2014  . DDD (degenerative disc disease), cervical   . DDD (degenerative disc disease), thoracolumbar   . DDD (degenerative disc disease), lumbosacral   . Chronic lower back pain   . Cancer of right breast ([place])   . Breast cancer ([place]) 02/04/14    right breast  . Diabetes mellitus without complication [facility])     Past Surgical History  Procedure Laterality Date  . Portacath placement Right  02/23/2014    power port, tip cavo-atrial junction  . Mastectomy complete / simple Left 09/22/2014  . Mastectomy modified radical Right 09/22/2014  . Tonsillectomy  1955  . Total knee arthroplasty Left ~ 2009  . Joint replacement    . Breast biopsy Right 02/2014  . Tubal ligation  1980  . Mastectomy modified radical Right 09/22/2014     Procedure:  RIGHT MASTECTOMY MODIFIED RADICAL;  Surgeon: Coralie Keens, MD;  Location: [facility];  Service: General;  Laterality: Right;  . Simple mastectomy with axillary sentinel node biopsy Left 09/22/2014    Procedure: LEFT SIMPLE MASTECTOMY;  Surgeon: Coralie Keens, MD;  Location: [facility];  Service: General;  Laterality: Left;    Social History:  reports that she quit smoking about 2 years ago. Her smoking use included Cigarettes. She has a 22.5 pack-year smoking history. She has never used smokeless tobacco. She reports that she does not drink alcohol or use illicit drugs.  Family History:  Family History  Problem Relation Age of Onset  . Cancer Father     lung cancer  . Cancer Maternal Aunt     Kidney  . Hypertension Maternal Uncle      Prior to Admission medications   Medication Sig Start Date End Date Taking? Authorizing Provider  aspirin EC 81 MG tablet Take 81 mg by mouth at bedtime.   Yes Historical Provider, MD  calcium-vitamin D (OSCAL WITH D) 500-200 MG-UNIT per tablet Take 1 tablet by mouth daily.   Yes Historical Provider, MD  clindamycin (CLEOCIN) 300 MG capsule Take 900 mg by mouth See admin instructions. Take 3 capsules (900 mg) by mouth one hour prior to dental appointment - last appointment October 11, 2015   Yes Historical Provider, MD  fluticasone (FLONASE) 50 MCG/ACT nasal spray Place 1 spray into both nostrils daily as needed for allergies or rhinitis. Patient taking differently: Place 1 spray into both nostrils at bedtime as needed for allergies or rhinitis.  07/26/14  Yes Nicholas Lose, MD  gabapentin (NEURONTIN) 300 MG capsule Take 300 mg by mouth 3 (three) times daily.    Yes Historical Provider, MD  IBRANCE 125 MG capsule TAKE 1 CAPSULE BY MOUTH ONCE DAILY WITH BREAKFAST. TAKE WHOLE WITH FOOD FOR 21 DAYS THEN 7 DAYS OFF 10/11/15  Yes Nicholas Lose, MD  ibuprofen (ADVIL,MOTRIN) 600 MG tablet Take 600 mg by mouth every 12 (twelve) hours.   Yes Historical Provider, MD    insulin lispro protamine-lispro (HUMALOG 75/25 MIX) (75-25) 100 UNIT/ML SUSP injection Inject 10-60 Units into the skin See admin instructions. Inject 60 units subcutaneously with breakfast and supper, inject 10 mls after lunch if CBG >150   Yes Historical Provider, MD  lidocaine-prilocaine (EMLA) cream Apply 1 application topically as needed. Patient taking differently: Apply 1 application topically as needed (prior to flushing of port (every 4-6 weeks)).  04/09/14  Yes Laurie Panda, NP  losartan (COZAAR) 50 MG tablet Take 50 mg by mouth daily.    Yes Historical Provider, MD  metFORMIN (GLUCOPHAGE-XR) 500 MG 24 hr tablet Take 1,000 mg by mouth 2 (two) times daily. 09/05/15  Yes Historical Provider, MD  Multiple Vitamin (MULTIVITAMIN WITH MINERALS) TABS tablet Take 1 tablet by mouth at bedtime.   Yes Historical Provider, MD  simvastatin (ZOCOR) 20 MG tablet Take 20 mg by mouth at bedtime. 09/28/15  Yes Historical Provider, MD  anastrozole (ARIMIDEX) 1 MG tablet Take 1 tablet by mouth  daily Patient not taking: Reported on  10/22/2015 05/24/15   Nicholas Lose, MD    Physical Exam: Filed Vitals:   10/22/15 1645 10/22/15 1716 10/22/15 1812 10/22/15 1815  BP: 98/55 109/68 121/71 112/58  Pulse: 107 67 72 70  Temp:      TempSrc:      Resp: 16 18 19    SpO2: 94% 100% 100% 100%   General: Not in acute distress HEENT:       Eyes: PERRL, EOMI, no scleral icterus.       ENT: No discharge from the ears and nose, no pharynx injection, no tonsillar enlargement.        Neck: No JVD, no bruit, no mass felt. Heme: No neck lymph node enlargement. Cardiac: S1/S2, RRR, No murmurs, No gallops or rubs. Pulm:  No rales, wheezing, rhonchi or rubs. Abd: Soft, nondistended, nontender, no rebound pain, no organomegaly, BS present. GU: No hematuria Ext: No pitting leg edema bilaterally. 2+DP/PT pulse bilaterally. Musculoskeletal: No joint deformities, No joint redness or warmth, no limitation of ROM in  spin. Skin: No rashes.  Neuro: Alert, oriented X3, cranial nerves II-XII grossly intact, moves all extremities normally. Psych: Patient is not psychotic, no suicidal or hemocidal ideation.  Labs on Admission: I have personally reviewed following labs and imaging studies  CBC:  Recent Labs Lab 10/22/15 1307  WBC 3.5*  HGB 11.2*  HCT 34.4*  MCV 88.9  PLT 99991111   Basic Metabolic Panel:  Recent Labs Lab 10/22/15 1307  NA 137  K 4.6  CL 105  CO2 24  GLUCOSE 170*  BUN 18  CREATININE 0.96  CALCIUM 9.1   GFR: Estimated Creatinine Clearance: 84.9 mL/min (by C-G formula based on Cr of 0.96). Liver Function Tests: No results for input(s): AST, ALT, ALKPHOS, BILITOT, PROT, ALBUMIN in the last 168 hours. No results for input(s): LIPASE, AMYLASE in the last 168 hours. No results for input(s): AMMONIA in the last 168 hours. Coagulation Profile: No results for input(s): INR, PROTIME in the last 168 hours. Cardiac Enzymes: No results for input(s): CKTOTAL, CKMB, CKMBINDEX, TROPONINI in the last 168 hours. BNP (last 3 results) No results for input(s): PROBNP in the last 8760 hours. HbA1C: No results for input(s): HGBA1C in the last 72 hours. CBG: No results for input(s): GLUCAP in the last 168 hours. Lipid Profile: No results for input(s): CHOL, HDL, LDLCALC, TRIG, CHOLHDL, LDLDIRECT in the last 72 hours. Thyroid Function Tests: No results for input(s): TSH, T4TOTAL, FREET4, T3FREE, THYROIDAB in the last 72 hours. Anemia Panel: No results for input(s): VITAMINB12, FOLATE, FERRITIN, TIBC, IRON, RETICCTPCT in the last 72 hours. Urine analysis:    Component Value Date/Time   COLORURINE YELLOW 08/01/2010 2050   APPEARANCEUR CLOUDY* 08/01/2010 2050   LABSPEC 1.018 08/01/2010 2050   PHURINE 5.0 08/01/2010 2050   GLUCOSEU NEGATIVE 08/01/2010 2050   HGBUR NEGATIVE 08/01/2010 2050   BILIRUBINUR NEGATIVE 08/01/2010 2050   KETONESUR NEGATIVE 08/01/2010 2050   PROTEINUR 100*  08/01/2010 2050   UROBILINOGEN 0.2 08/01/2010 2050   NITRITE NEGATIVE 08/01/2010 2050   LEUKOCYTESUR NEGATIVE 08/01/2010 2050   Sepsis Labs: @LABRCNTIP (procalcitonin:4,lacticidven:4) )No results found for this or any previous visit (from the past 240 hour(s)).   Radiological Exams on Admission: Dg Chest 2 View  10/22/2015  CLINICAL DATA:  Central chest pain and shortness of breath. EXAM: CHEST  2 VIEW COMPARISON:  PET-CT 08/08/2015 FINDINGS: Right-sided injectable port terminates within the expected location of superior vena cava. Cardiomediastinal silhouette is normal. Mediastinal contours appear intact. There is  no evidence of focal airspace consolidation, pleural effusion or pneumothorax. Osseous structures are without acute abnormality. Soft tissues are grossly normal. IMPRESSION: No active cardiopulmonary disease. Electronically Signed   By: Fidela Salisbury M.D.   On: 10/22/2015 13:49   Ct Angio Chest Pe W/cm &/or Wo Cm  10/22/2015  CLINICAL DATA:  Patient with shortness of breath and chest pain. Low oxygen saturation. EXAM: CT ANGIOGRAPHY CHEST WITH CONTRAST TECHNIQUE: Multidetector CT imaging of the chest was performed using the standard protocol during bolus administration of intravenous contrast. Multiplanar CT image reconstructions and MIPs were obtained to evaluate the vascular anatomy. CONTRAST:  100 cc Isovue 370 COMPARISON:  PET-CT 08/08/2015. FINDINGS: Mediastinum/Nodes: The thyroid is heterogeneous in attenuation. Right anterior chest wall Port-A-Cath is present with tip terminating in the right atrium. No axillary, mediastinal or hilar lymphadenopathy. Heart is enlarged. Coronary arterial vascular calcifications. Main pulmonary artery is enlarged measuring 3.7 cm. Extensive bilateral pulmonary emboli are demonstrated from the level of the main right and left pulmonary arteries and extending throughout the right and left lungs bilaterally. RV/LV ratio: 1.8. Esophagus is  unremarkable. Lungs/Pleura: Central airways are patent. Interval development of irregular subpleural consolidation within the right upper lobe. Additionally there is a 2 cm patchy area of ground-glass attenuation within the right upper lobe (image 26; series 5). No pleural effusion or pneumothorax. No pleural effusion or pneumothorax. Upper abdomen: Hepatic steatosis. Reflux of contrast into the hepatic veins. Unchanged 3.1 cm right adrenal adenoma. Small hiatal hernia. Musculoskeletal: Interval development of a 1.5 cm lytic lesion within the T5 vertebral body. Patchy lucency and sclerosis is demonstrated throughout the visualized thoracic spine. Re- demonstrated postsurgical changes involving the chest wall anteriorly. Re- demonstrated seroma within the anterior left chest wall. Review of the MIP images confirms the above findings. IMPRESSION: Massive bilateral pulmonary emboli beginning within the main right and main left pulmonary arteries extending throughout the lungs bilaterally. Evidence for right heart strain with RV/LV ratio of 1.8 and reflux of contrast into the hepatic veins. The presence of right heart strain has been associated with an increased risk of morbidity and mortality. Please activate Code PE by paging [phone]. Subpleural consolidation within the right upper lobe and an adjacent focal area of ground-glass attenuation which may be secondary to radiation change, infarct from pulmonary emboli or infectious/inflammatory process. Metastatic disease not favored however not excluded. Recommend attention on followup. T5 lytic lesion compatible with osseous metastasis. Critical Value/emergent results were called by telephone at the time of interpretation on 10/22/2015 at 6:14 pm to Dr. Darl Householder, who verbally acknowledged these results. Electronically Signed   By: Lovey Newcomer M.D.   On: 10/22/2015 18:21     EKG: Independently reviewed. Sinus rhythm, QTC 455, nonspecific T-wave  change.  Assessment/Plan Principal Problem:   Pulmonary embolism (HCC) Active Problems:   Breast cancer of upper-outer quadrant of right female breast ([place])   Hypertension   Bone metastases ([place])   Port catheter in place   Diabetes mellitus without complication ([place])   PE (pulmonary embolism)   Elevated troponin   Pulmonary embolism ([place]): pt has massive bilateral pulmonary emboli with evidence for right heart strain. Initial blood pressure soft, which is responding to the IV fluids. Patient has moderate chest pain, can speak in full sentences. Currently hemodynamically stable. PCCM was consulted.  -admit to stepdown for close monitoring overnight -heparin drip initiated -2D echocardiogram ordered -LE dopplers ordered to evaluate for DVT -trop x 3 -pain control: When necessary oxycodone and morphine -  f/u PCCM recommendations -IV fluid: 1 L normal saline, then 100 mL per hour  Elevated troponin: 0.15, this is likely due to heart straining secondary to massive PE. -On IV heparin  -trend trop x 3  Breast cancer of upper-outer quadrant of right female breast [facility]): s/p of bilateral mastectomy, s/p of radiation therapy. Has bone metastases. Currently on chemotherapy, Ibrance 21-day on and 7-day off; and monthly Faslodex (last dose was on 10/10/15). Pt is followed by Dr. Lindi Adie, last seen was on 10/10/15. -continue current chemotherapy -f/u with Dr. Lindi Adie  HTN: -hold Cozaar due to softer blood pressure -IV hydralazine when necessary  DM-II: Last A1c 7.3 on 02/09/08 , fairly controled. Patient is taking Humalog mix the insulin 75/25, 60 mg twice a day at home -Switched to mix the insulin 70/30, 40 mg twice a day -SSI  HLD: Last LDL was not on record. -Continue home medications: Zocor    DVT ppx: on IV Heparin        Code Status: Full code Family Communication: None at bed side.   Disposition Plan:  Anticipate discharge back to previous home environment Consults called:   PCCM Admission status: SDU/inpation       Date of Service 10/22/2015    Ivor Costa [facility] Pager [phone]  If 7PM-7AM, please contact night-coverage [email or web address] Password [email or web address] 10/22/2015, 8:09 PM

## 2015-10-22 NOTE — ED Notes (Signed)
Pt ambulated to restroom; sats 88% with labored breathing after returning to room.

## 2015-10-22 NOTE — Progress Notes (Signed)
ANTICOAGULATION CONSULT NOTE - Initial Consult  Pharmacy Consult for Heparin Indication: pulmonary embolus  Allergies  Allergen Reactions  . Amoxicillin Nausea And Vomiting  . Hydrocodone Itching  . Lisinopril Cough    Patient Measurements:    Ht: 69 inches Wt: 137.3 kg IBW: 66.2 kg Heparin Dosing Weight: 99 kg  Vital Signs: Temp: 98.6 F (37 C) (06/17 1614) Temp Source: Oral (06/17 1614) BP: 121/71 mmHg (06/17 1812) Pulse Rate: 72 (06/17 1812)  Labs:  Recent Labs  10/22/15 1307  HGB 11.2*  HCT 34.4*  PLT 218  CREATININE 0.96    Estimated Creatinine Clearance: 84.9 mL/min (by C-G formula based on Cr of 0.96).   Medical History: Past Medical History  Diagnosis Date  . Heart murmur     mitral valve  . Complication of anesthesia     O2 SAT  DROP   . Hypertension   . Neuromuscular disorder ([place])   . Type II diabetes mellitus ([place])   . Mitral regurgitation   . Hyperlipidemia   . Diabetic peripheral neuropathy ([place])   . Sinus headache     "seasonal"  . Arthritis     "knees, ankles" (09/22/2014  . DDD (degenerative disc disease), cervical   . DDD (degenerative disc disease), thoracolumbar   . DDD (degenerative disc disease), lumbosacral   . Chronic lower back pain   . Cancer of right breast ([place])   . Breast cancer ([place]) 02/04/14    right breast    Assessment: 55 YOF with obesity and hx breast CA who presented to the [facility] on 6/17 with SOB and CP and persistent hypoxia. CTA showed a massive B/L PE with R-heart strain. Pharmacy consulted to start heparin for anticoagulation.   Hgb 11.2, plts wnl. Hep Wt: 100 kg - will dose aggressively given R-heart strain.   Goal of Therapy:  Heparin level 0.3-0.7 units/ml Monitor platelets by anticoagulation protocol: Yes   Plan:  1. Heparin bolus of 5500 units x 1 2. Start heparin at a drip rate of 1700 units/hr (17 ml/hr) 3. Will continue to monitor for any signs/symptoms of bleeding and will follow up with  heparin level in 6 hours   Alycia Rossetti, PharmD, BCPS Clinical Pharmacist Pager: [phone] 10/22/2015 6:36 PM

## 2015-10-22 NOTE — Consult Note (Signed)
PULMONARY / CRITICAL CARE MEDICINE   Name: Tonya Alvarez MRN: [id number] DOB: [birth year]    ADMISSION DATE:  10/22/2015 CONSULTATION DATE:  10/22/15  REFERRING MD:  [facility]  CHIEF COMPLAINT:  Shortness of breath  HISTORY OF PRESENT ILLNESS:   [name] is a 84F with PMH significant for HTN, diabetes mellitus type II, HLD, arthritis, metastatic breast cancer on active faslodex with ibrance, and mitral regurge who presents with acute onset shortness of breath and chest pain that began this morning around 10am. Her sat was 90% on room air when EMS arrived. She did not fall or experience syncope. On arrival to the ED she was noted to be saturating well on 2L [place]. Blood pressures were a little soft, with systolics in the [id number] (she usually runs 120s/60s), but they are taking the blood pressures on her forearm. CTA PE was obtained, which showed bilateral pulmonary emboli extending from the main pulmonary arteries into both lungs. RV/LV ratio was 1.8. Troponin was 0.02 to 0.15. BNP not yet obtained.   She reports that she had some shortness of breath on Thursday that sort of got better, but then today she had this chest pain and worse shortness of breath. The chest pain has since resolved. No syncope, light headedness or true hypotension. She has not had any recent surgeries, though she did have a dental extraction a few weeks ago. She has not had any GI bleeding, no history of brain mets or bleeds. No prior DVT or PE. No fever / chills / night sweats. No hemoptysis. No pain with inspiration.  PAST MEDICAL HISTORY :  She  has a past medical history of Heart murmur; Complication of anesthesia; Hypertension; Neuromuscular disorder ([place]); Type II diabetes mellitus ([place]); Mitral regurgitation; Hyperlipidemia; Diabetic peripheral neuropathy ([place]); Sinus headache; Arthritis; DDD (degenerative disc disease), cervical; DDD (degenerative disc disease), thoracolumbar; DDD (degenerative disc disease), lumbosacral;  Chronic lower back pain; Cancer of right breast [facility]); and Breast cancer ([place]) (02/04/14).  PAST SURGICAL HISTORY: She  has past surgical history that includes Portacath placement (Right, 02/23/2014); Mastectomy complete / simple (Left, 09/22/2014); Mastectomy modified radical (Right, 09/22/2014); Tonsillectomy (1955); Total knee arthroplasty (Left, ~ 2009); Joint replacement; Breast biopsy (Right, 02/2014); Tubal ligation (1980); Mastectomy modified radical (Right, 09/22/2014); and Simple mastectomy with axillary sentinel node biopsy (Left, 09/22/2014).  Allergies  Allergen Reactions  . Amoxicillin Nausea And Vomiting  . Hydrocodone Itching  . Lisinopril Cough    No current facility-administered medications on file prior to encounter.   Current Outpatient Prescriptions on File Prior to Encounter  Medication Sig  . calcium-vitamin D (OSCAL WITH D) 500-200 MG-UNIT per tablet Take 1 tablet by mouth daily.  . fluticasone (FLONASE) 50 MCG/ACT nasal spray Place 1 spray into both nostrils daily as needed for allergies or rhinitis. (Patient taking differently: Place 1 spray into both nostrils at bedtime as needed for allergies or rhinitis. )  . gabapentin (NEURONTIN) 300 MG capsule Take 300 mg by mouth 3 (three) times daily.   Leslee Home 125 MG capsule TAKE 1 CAPSULE BY MOUTH ONCE DAILY WITH BREAKFAST. TAKE WHOLE WITH FOOD FOR 21 DAYS THEN 7 DAYS OFF  . ibuprofen (ADVIL,MOTRIN) 600 MG tablet Take 600 mg by mouth every 12 (twelve) hours.  . insulin lispro protamine-lispro (HUMALOG 75/25 MIX) (75-25) 100 UNIT/ML SUSP injection Inject 10-60 Units into the skin See admin instructions. Inject 60 units subcutaneously with breakfast and supper, inject 10 mls after lunch if CBG >150  . lidocaine-prilocaine (EMLA) cream  Apply 1 application topically as needed. (Patient taking differently: Apply 1 application topically as needed (prior to flushing of port (every 4-6 weeks)). )  . losartan (COZAAR) 50 MG tablet Take  50 mg by mouth daily.   . Multiple Vitamin (MULTIVITAMIN WITH MINERALS) TABS tablet Take 1 tablet by mouth at bedtime.  Marland Kitchen anastrozole (ARIMIDEX) 1 MG tablet Take 1 tablet by mouth  daily (Patient not taking: Reported on 10/22/2015)    FAMILY HISTORY:  Her indicated that her mother is deceased. She indicated that her father is deceased.   SOCIAL HISTORY: She  reports that she quit smoking about 2 years ago. Her smoking use included Cigarettes. She has a 22.5 pack-year smoking history. She has never used smokeless tobacco. She reports that she does not drink alcohol or use illicit drugs.  REVIEW OF SYSTEMS:  See HPI for pertinent positives/negatives; remainder as below.  General: no weight change, no fever, no chills Cardiovascular: no palpitations, orthopnea, or PND Respiratory: no cough, sputum production Gastrointestinal: no nausea, vomiting, diarrhea, or constipation Genitourinary: no pain with urination, no foul odor, no frequency, no urgency Musculoskeletal: no joint pain or swelling, no muscle aches Skin: no rashes, sores, or ulcers Endocrine: hx diabetes on insulin, no thyroid problems Neurologic: no numbness, tingling, dizziness, or visual changes Hematologic: no bleeding, bruising, or clotting problems Psychiatric: no depression or anxiety, no suicidal ideation or intent   SUBJECTIVE:  See HPI  VITAL SIGNS: BP 112/58 mmHg  Pulse 70  Temp(Src) 98.6 F (37 C) (Oral)  Resp 19  SpO2 100%  HEMODYNAMICS:    VENTILATOR SETTINGS:    INTAKE / OUTPUT:    PHYSICAL EXAMINATION: Physical Exam: Temp:  [98.6 F (37 C)] 98.6 F (37 C) (06/17 1614) Pulse Rate:  [65-107] 70 (06/17 1815) Resp:  [16-22] 19 (06/17 1812) BP: (92-121)/(50-89) 112/58 mmHg (06/17 1815) SpO2:  [94 %-100 %] 100 % (06/17 1815)  General Well nourished, well developed, no apparent distress  HEENT No gross abnormalities. Oropharynx clear. Mallampati IV, adequate dentition.   Pulmonary Clear to  auscultation bilaterally with no wheezes, rales or ronchi. Good effort, symmetrical expansion. Equal resonance and fremitus.   Cardiovascular Normal rate, regular rhythm. S1, s2. No m/r/g. Distal pulses palpable.  Abdomen Obese, soft, non-tender, non-distended, positive bowel sounds, no palpable organomegaly or masses. Normoresonant to percussion.  Musculoskeletal Grossly normal.   Lymphatics No cervical, supraclavicular or axillary adenopathy.   Neurologic Grossly intact. No focal deficits.   Skin/Integuement No rash, no cyanosis, no clubbing. R port-a-cath without erythema or swelling. Trace lower extremity edema bilaterally.     LABS:  BMET  Recent Labs Lab 10/22/15 1307  NA 137  K 4.6  CL 105  CO2 24  BUN 18  CREATININE 0.96  GLUCOSE 170*    Electrolytes  Recent Labs Lab 10/22/15 1307  CALCIUM 9.1    CBC  Recent Labs Lab 10/22/15 1307  WBC 3.5*  HGB 11.2*  HCT 34.4*  PLT 218    Coag's No results for input(s): APTT, INR in the last 168 hours.  Sepsis Markers No results for input(s): LATICACIDVEN, PROCALCITON, O2SATVEN in the last 168 hours.  ABG No results for input(s): PHART, PCO2ART, PO2ART in the last 168 hours.  Liver Enzymes No results for input(s): AST, ALT, ALKPHOS, BILITOT, ALBUMIN in the last 168 hours.  Cardiac Enzymes No results for input(s): TROPONINI, PROBNP in the last 168 hours.  Glucose No results for input(s): GLUCAP in the last 168 hours.  Imaging  Dg Chest 2 View  10/22/2015  CLINICAL DATA:  Central chest pain and shortness of breath. EXAM: CHEST  2 VIEW COMPARISON:  PET-CT 08/08/2015 FINDINGS: Right-sided injectable port terminates within the expected location of superior vena cava. Cardiomediastinal silhouette is normal. Mediastinal contours appear intact. There is no evidence of focal airspace consolidation, pleural effusion or pneumothorax. Osseous structures are without acute abnormality. Soft tissues are grossly normal.  IMPRESSION: No active cardiopulmonary disease. Electronically Signed   By: Fidela Salisbury M.D.   On: 10/22/2015 13:49   Ct Angio Chest Pe W/cm &/or Wo Cm  10/22/2015  CLINICAL DATA:  Patient with shortness of breath and chest pain. Low oxygen saturation. EXAM: CT ANGIOGRAPHY CHEST WITH CONTRAST TECHNIQUE: Multidetector CT imaging of the chest was performed using the standard protocol during bolus administration of intravenous contrast. Multiplanar CT image reconstructions and MIPs were obtained to evaluate the vascular anatomy. CONTRAST:  100 cc Isovue 370 COMPARISON:  PET-CT 08/08/2015. FINDINGS: Mediastinum/Nodes: The thyroid is heterogeneous in attenuation. Right anterior chest wall Port-A-Cath is present with tip terminating in the right atrium. No axillary, mediastinal or hilar lymphadenopathy. Heart is enlarged. Coronary arterial vascular calcifications. Main pulmonary artery is enlarged measuring 3.7 cm. Extensive bilateral pulmonary emboli are demonstrated from the level of the main right and left pulmonary arteries and extending throughout the right and left lungs bilaterally. RV/LV ratio: 1.8. Esophagus is unremarkable. Lungs/Pleura: Central airways are patent. Interval development of irregular subpleural consolidation within the right upper lobe. Additionally there is a 2 cm patchy area of ground-glass attenuation within the right upper lobe (image 26; series 5). No pleural effusion or pneumothorax. No pleural effusion or pneumothorax. Upper abdomen: Hepatic steatosis. Reflux of contrast into the hepatic veins. Unchanged 3.1 cm right adrenal adenoma. Small hiatal hernia. Musculoskeletal: Interval development of a 1.5 cm lytic lesion within the T5 vertebral body. Patchy lucency and sclerosis is demonstrated throughout the visualized thoracic spine. Re- demonstrated postsurgical changes involving the chest wall anteriorly. Re- demonstrated seroma within the anterior left chest wall. Review of the  MIP images confirms the above findings. IMPRESSION: Massive bilateral pulmonary emboli beginning within the main right and main left pulmonary arteries extending throughout the lungs bilaterally. Evidence for right heart strain with RV/LV ratio of 1.8 and reflux of contrast into the hepatic veins. The presence of right heart strain has been associated with an increased risk of morbidity and mortality. Please activate Code PE by paging [phone]. Subpleural consolidation within the right upper lobe and an adjacent focal area of ground-glass attenuation which may be secondary to radiation change, infarct from pulmonary emboli or infectious/inflammatory process. Metastatic disease not favored however not excluded. Recommend attention on followup. T5 lytic lesion compatible with osseous metastasis. Critical Value/emergent results were called by telephone at the time of interpretation on 10/22/2015 at 6:14 pm to Dr. Darl Householder, who verbally acknowledged these results. Electronically Signed   By: Lovey Newcomer M.D.   On: 10/22/2015 18:21     STUDIES:    CULTURES: None  ANTIBIOTICS: none  SIGNIFICANT EVENTS:   LINES/TUBES: PIV  DISCUSSION: Ms. Waninger is a 49F with metastatic breast cancer and associated pulmonary emboli. She has no prior history of clotting disorder. By CT criteria, there is concern for right heart strain, although clinically she does not appear to have suffered a submassive PE. I had a long discussion with her at bedside about the risks and benefits of conservative treatment (Lovenox) vs catheter directed thrombolysis followed by long-term anticoagulation. She understands  the potential risks and benefits and has opted for conservative management. Given her metastatic disease, I think this is a judicious choice. I would recommend long-term anticoagulation with lovenox in the setting of active malignancy. Lower extremity dopplers are certainly reasonable and, if significant clot burden is  identified, more aggressive management might be warranted. She will need a TTE and BNP as well.   RECOMMENDATIONS:  Therapeutic anticoagulation ([facility])  TTE, BNP, LE dopplers to complete evaluation  Supplemental oxygen as needed to maintain saturations >90%  Walk for desat prior to discharge  Yisroel Ramming [facility] [place] Pager: [phone]  10/22/2015, 7:33 PM

## 2015-10-22 NOTE — ED Provider Notes (Signed)
Medical screening examination/treatment/procedure(s) were conducted as a shared visit with non-physician practitioner(s) and myself.  I personally evaluated the patient during the encounter.   EKG Interpretation None       Results for orders placed or performed during the hospital encounter of [id number]  Basic metabolic panel  Result Value Ref Range   Sodium 137 135 - 145 mmol/L   Potassium 4.6 3.5 - 5.1 mmol/L   Chloride 105 101 - 111 mmol/L   CO2 24 22 - 32 mmol/L   Glucose, Bld 170 (H) 65 - 99 mg/dL   BUN 18 6 - 20 mg/dL   Creatinine, Ser 0.96 0.44 - 1.00 mg/dL   Calcium 9.1 8.9 - 10.3 mg/dL   GFR calc non Af Amer 60 (L) >60 mL/min   GFR calc Af Amer >60 >60 mL/min   Anion gap 8 5 - 15  CBC  Result Value Ref Range   WBC 3.5 (L) 4.0 - 10.5 K/uL   RBC 3.87 3.87 - 5.11 MIL/uL   Hemoglobin 11.2 (L) 12.0 - 15.0 g/dL   HCT 34.4 (L) 36.0 - 46.0 %   MCV 88.9 78.0 - 100.0 fL   MCH 28.9 26.0 - 34.0 pg   MCHC 32.6 30.0 - 36.0 g/dL   RDW 21.2 (H) 11.5 - 15.5 %   Platelets 218 150 - 400 K/uL  I-stat troponin, ED  Result Value Ref Range   Troponin i, poc 0.02 0.00 - 0.08 ng/mL   Comment 3           Dg Chest 2 View  10/22/2015  CLINICAL DATA:  Central chest pain and shortness of breath. EXAM: CHEST  2 VIEW COMPARISON:  PET-CT 08/08/2015 FINDINGS: Right-sided injectable port terminates within the expected location of superior vena cava. Cardiomediastinal silhouette is normal. Mediastinal contours appear intact. There is no evidence of focal airspace consolidation, pleural effusion or pneumothorax. Osseous structures are without acute abnormality. Soft tissues are grossly normal. IMPRESSION: No active cardiopulmonary disease. Electronically Signed   By: Fidela Salisbury M.D.   On: 10/22/2015 13:49    Patient presents with a complaint of shortness of breath and not chest pain that started at 10 this morning. Chest pain resolved after a couple hours. Has not reoccurred. Patient's oxygen  saturation when picked up was around 90. Here with the patient being off oxygen she'll be set below 90%. No good explanation for the reason of why the hypoxia. So CT angios chest has been ordered for further evaluation and to rule out pulmonary embolus. Regular chest x-ray shows no pneumonia pneumothorax or pulmonary edema. There is no leukocytosis no significant electrolyte abnormalities.  The patient is at significant risk due to her history of breast cancer and current treatment for either DVT or pulmonary embolus. Either way patient will require admission because the hypoxia is unexplained and she does not on home oxygen.  Patient currently alert lungs are clear bilaterally abdomen soft and nontender. Heart regular.  Fredia Sorrow, MD 10/22/15 1729

## 2015-10-22 NOTE — ED Provider Notes (Signed)
CSN: [id number]     Arrival date & time 10/22/15  1246 History   First MD Initiated Contact with Patient 10/22/15 1254     Chief Complaint  Patient presents with  . Shortness of Breath    HPI Comments: 70 year old female presents with acute onset of shortness of breath and chest pain. Past medical history significant for breast cancer currently on chemo, DM, hypertension, hyperlipidemia. No significant cardiac history or history of lung disease. She states that she has been feeling "off" for the past 2 days with associated fatigue and body chills. This morning she states she was doing some general housework and she started feeling short of breath. She sat down however her shortness of breath continued and she started to have substernal chest pain. This chest pain intensified and started radiating to her jaw and left shoulder. She states it felt like a pressure. She's never had this pain before. She then called EMS who advised her to take 4 baby aspirin which brought her pain down to a 5. When EMS arrived on the scene they found that her O2 sats were in the low 90s and she was placed on a nonrebreather. She was given nitroglycerin which improved her pain to a 2. She states she is currently without pain. Denies fever, diaphoresis, headache, weakness, abdominal pain, nausea, vomiting. She is a former smoker. Denies family history of cardiac disease.  Patient is a 70 y.o. female presenting with shortness of breath.  Shortness of Breath Associated symptoms: chest pain and cough   Associated symptoms: no abdominal pain, no diaphoresis, no fever, no headaches and no vomiting     Past Medical History  Diagnosis Date  . Heart murmur     mitral valve  . Complication of anesthesia     O2 SAT  DROP   . Hypertension   . Neuromuscular disorder ([place])   . Type II diabetes mellitus ([place])   . Mitral regurgitation   . Hyperlipidemia   . Diabetic peripheral neuropathy ([place])   . Sinus headache     "seasonal"   . Arthritis     "knees, ankles" (09/22/2014  . DDD (degenerative disc disease), cervical   . DDD (degenerative disc disease), thoracolumbar   . DDD (degenerative disc disease), lumbosacral   . Chronic lower back pain   . Cancer of right breast ([place])   . Breast cancer ([place]) 02/04/14    right breast   Past Surgical History  Procedure Laterality Date  . Portacath placement Right 02/23/2014    power port, tip cavo-atrial junction  . Mastectomy complete / simple Left 09/22/2014  . Mastectomy modified radical Right 09/22/2014  . Tonsillectomy  1955  . Total knee arthroplasty Left ~ 2009  . Joint replacement    . Breast biopsy Right 02/2014  . Tubal ligation  1980  . Mastectomy modified radical Right 09/22/2014    Procedure:  RIGHT MASTECTOMY MODIFIED RADICAL;  Surgeon: Coralie Keens, MD;  Location: [facility];  Service: General;  Laterality: Right;  . Simple mastectomy with axillary sentinel node biopsy Left 09/22/2014    Procedure: LEFT SIMPLE MASTECTOMY;  Surgeon: Coralie Keens, MD;  Location: [facility];  Service: General;  Laterality: Left;   Family History  Problem Relation Age of Onset  . Cancer Father     lung cancer  . Cancer Maternal Aunt     Kidney  . Hypertension Maternal Uncle    Social History  Substance Use Topics  . Smoking status: Former Smoker --  0.75 packs/day for 30 years    Types: Cigarettes    Quit date: 05/07/2013  . Smokeless tobacco: Never Used  . Alcohol Use: No     Comment: 09/22/2014 "I'll drink a few times/yr"   OB History    No data available     Review of Systems  Constitutional: Positive for chills. Negative for fever and diaphoresis.  Respiratory: Positive for cough and shortness of breath.   Cardiovascular: Positive for chest pain.  Gastrointestinal: Negative for nausea, vomiting and abdominal pain.  Neurological: Negative for headaches.  All other systems reviewed and are negative.   Allergies  Amoxicillin; Hydrocodone; and  Lisinopril  Home Medications   Prior to Admission medications   Medication Sig Start Date End Date Taking? Authorizing Provider  acetaminophen (TYLENOL) 500 MG tablet Take 500 mg by mouth every 6 (six) hours as needed.    Historical Provider, MD  anastrozole (ARIMIDEX) 1 MG tablet Take 1 tablet by mouth  daily 05/24/15   Nicholas Lose, MD  aspirin 81 MG tablet Take 81 mg by mouth at bedtime.     Historical Provider, MD  calcium-vitamin D (OSCAL WITH D) 500-200 MG-UNIT per tablet Take 1 tablet by mouth daily.    Historical Provider, MD  fluticasone (FLONASE) 50 MCG/ACT nasal spray Place 1 spray into both nostrils daily as needed for allergies or rhinitis. 07/26/14   Nicholas Lose, MD  gabapentin (NEURONTIN) 300 MG capsule Take 300 mg by mouth 2 (two) times daily.    Historical Provider, MD  hyaluronate sodium (RADIAPLEXRX) GEL Apply 1 application topically once. Reported on 06/10/2015    Historical Provider, MD  IBRANCE 125 MG capsule TAKE 1 CAPSULE BY MOUTH ONCE DAILY WITH BREAKFAST. TAKE WHOLE WITH FOOD FOR 21 DAYS THEN 7 DAYS OFF 10/11/15   Nicholas Lose, MD  ibuprofen (ADVIL,MOTRIN) 600 MG tablet Take 600 mg by mouth every 12 (twelve) hours.    Historical Provider, MD  insulin lispro protamine-lispro (HUMALOG 75/25 MIX) (75-25) 100 UNIT/ML SUSP injection Inject 60-68 Units into the skin 2 (two) times daily with a meal. Inject 60-68 units into the skin 2 times daily, and 10 ml in the middle of the day    Historical Provider, MD  lidocaine-prilocaine (EMLA) cream Apply 1 application topically as needed. 04/09/14   Laurie Panda, NP  losartan (COZAAR) 50 MG tablet Take 50 mg by mouth every morning.    Historical Provider, MD  metFORMIN (GLUCOPHAGE) 500 MG tablet Take 1,000 mg by mouth 2 (two) times daily with a meal.     Historical Provider, MD  Multiple Vitamin (MULTIVITAMIN WITH MINERALS) TABS tablet Take 1 tablet by mouth at bedtime.    Historical Provider, MD  simvastatin (ZOCOR) 10 MG tablet Take  20 mg by mouth every evening.     Historical Provider, MD  tiZANidine (ZANAFLEX) 2 MG tablet Take 2 mg by mouth at bedtime as needed for muscle spasms. Reported on 06/10/2015    Historical Provider, MD   BP 121/64 mmHg  Pulse 125  Temp(Src) 98.1 F (36.7 C) (Oral)  Resp 24  Ht 5\' 9"  (1.753 m)  Wt 136.5 kg  BMI 44.42 kg/m2  SpO2 91%   Physical Exam  Constitutional: She is oriented to person, place, and time. She appears well-developed and well-nourished. No distress.  HENT:  Head: Normocephalic and atraumatic.  Eyes: Conjunctivae are normal. Pupils are equal, round, and reactive to light. Right eye exhibits no discharge. Left eye exhibits no discharge. No scleral  icterus.  Neck: Normal range of motion.  Cardiovascular: Normal rate and regular rhythm.  Exam reveals no gallop and no friction rub.   No murmur heard. Pulmonary/Chest: Effort normal. No respiratory distress. She has no wheezes. She has no rales. She exhibits no tenderness.  Abdominal: Soft. She exhibits no distension. There is no tenderness.  Musculoskeletal:  Bilateral 1+ pitting pedal edema. No calf tenderness, redness, or swelling.  Neurological: She is alert and oriented to person, place, and time.  Skin: Skin is warm and dry. She is not diaphoretic.  Psychiatric: She has a normal mood and affect. Her behavior is normal.    ED Course  Procedures (including critical care time) Labs Review Labs Reviewed  BASIC METABOLIC PANEL - Abnormal; Notable for the following:    Glucose, Bld 170 (*)    GFR calc non Af Amer 60 (*)    All other components within normal limits  CBC - Abnormal; Notable for the following:    WBC 3.5 (*)    Hemoglobin 11.2 (*)    HCT 34.4 (*)    RDW 21.2 (*)    All other components within normal limits  HEPARIN LEVEL (UNFRACTIONATED) - Abnormal; Notable for the following:    Heparin Unfractionated 0.71 (*)    All other components within normal limits  APTT - Abnormal; Notable for the following:     aPTT 114 (*)    All other components within normal limits  TROPONIN I - Abnormal; Notable for the following:    Troponin I 0.16 (*)    All other components within normal limits  TROPONIN I - Abnormal; Notable for the following:    Troponin I 0.15 (*)    All other components within normal limits  TROPONIN I - Abnormal; Notable for the following:    Troponin I 0.10 (*)    All other components within normal limits  BASIC METABOLIC PANEL - Abnormal; Notable for the following:    Calcium 8.6 (*)    All other components within normal limits  CBC - Abnormal; Notable for the following:    RBC 3.56 (*)    Hemoglobin 10.1 (*)    HCT 31.5 (*)    RDW 21.1 (*)    All other components within normal limits  BRAIN NATRIURETIC PEPTIDE - Abnormal; Notable for the following:    B Natriuretic Peptide 119.6 (*)    All other components within normal limits  GLUCOSE, CAPILLARY - Abnormal; Notable for the following:    Glucose-Capillary 124 (*)    All other components within normal limits  GLUCOSE, CAPILLARY - Abnormal; Notable for the following:    Glucose-Capillary 143 (*)    All other components within normal limits  I-STAT TROPOININ, ED - Abnormal; Notable for the following:    Troponin i, poc 0.15 (*)    All other components within normal limits  MRSA PCR SCREENING  PROTIME-INR  HEPARIN LEVEL (UNFRACTIONATED)  Randolm Idol, ED    Imaging Review Dg Chest 2 View  10/22/2015  CLINICAL DATA:  Central chest pain and shortness of breath. EXAM: CHEST  2 VIEW COMPARISON:  PET-CT 08/08/2015 FINDINGS: Right-sided injectable port terminates within the expected location of superior vena cava. Cardiomediastinal silhouette is normal. Mediastinal contours appear intact. There is no evidence of focal airspace consolidation, pleural effusion or pneumothorax. Osseous structures are without acute abnormality. Soft tissues are grossly normal. IMPRESSION: No active cardiopulmonary disease. Electronically  Signed   By: Fidela Salisbury M.D.   On: 10/22/2015  13:49   Ct Angio Chest Pe W/cm &/or Wo Cm  10/22/2015  CLINICAL DATA:  Patient with shortness of breath and chest pain. Low oxygen saturation. EXAM: CT ANGIOGRAPHY CHEST WITH CONTRAST TECHNIQUE: Multidetector CT imaging of the chest was performed using the standard protocol during bolus administration of intravenous contrast. Multiplanar CT image reconstructions and MIPs were obtained to evaluate the vascular anatomy. CONTRAST:  100 cc Isovue 370 COMPARISON:  PET-CT 08/08/2015. FINDINGS: Mediastinum/Nodes: The thyroid is heterogeneous in attenuation. Right anterior chest wall Port-A-Cath is present with tip terminating in the right atrium. No axillary, mediastinal or hilar lymphadenopathy. Heart is enlarged. Coronary arterial vascular calcifications. Main pulmonary artery is enlarged measuring 3.7 cm. Extensive bilateral pulmonary emboli are demonstrated from the level of the main right and left pulmonary arteries and extending throughout the right and left lungs bilaterally. RV/LV ratio: 1.8. Esophagus is unremarkable. Lungs/Pleura: Central airways are patent. Interval development of irregular subpleural consolidation within the right upper lobe. Additionally there is a 2 cm patchy area of ground-glass attenuation within the right upper lobe (image 26; series 5). No pleural effusion or pneumothorax. No pleural effusion or pneumothorax. Upper abdomen: Hepatic steatosis. Reflux of contrast into the hepatic veins. Unchanged 3.1 cm right adrenal adenoma. Small hiatal hernia. Musculoskeletal: Interval development of a 1.5 cm lytic lesion within the T5 vertebral body. Patchy lucency and sclerosis is demonstrated throughout the visualized thoracic spine. Re- demonstrated postsurgical changes involving the chest wall anteriorly. Re- demonstrated seroma within the anterior left chest wall. Review of the MIP images confirms the above findings. IMPRESSION: Massive  bilateral pulmonary emboli beginning within the main right and main left pulmonary arteries extending throughout the lungs bilaterally. Evidence for right heart strain with RV/LV ratio of 1.8 and reflux of contrast into the hepatic veins. The presence of right heart strain has been associated with an increased risk of morbidity and mortality. Please activate Code PE by paging [phone]. Subpleural consolidation within the right upper lobe and an adjacent focal area of ground-glass attenuation which may be secondary to radiation change, infarct from pulmonary emboli or infectious/inflammatory process. Metastatic disease not favored however not excluded. Recommend attention on followup. T5 lytic lesion compatible with osseous metastasis. Critical Value/emergent results were called by telephone at the time of interpretation on 10/22/2015 at 6:14 pm to Dr. Darl Householder, who verbally acknowledged these results. Electronically Signed   By: Lovey Newcomer M.D.   On: 10/22/2015 18:21   I have personally reviewed and evaluated these images and lab results as part of my medical decision-making.   EKG Interpretation None     Meds given in ED:  Medications  iopamidol (ISOVUE-370) 76 % injection (not administered)  heparin ADULT infusion 100 units/mL (25000 units/249mL sodium chloride 0.45%) (1,700 Units/hr Intravenous New Bag/Given 10/22/15 1843)  sodium chloride 0.9 % bolus 1,000 mL (1,000 mLs Intravenous New Bag/Given 10/22/15 1715)  heparin bolus via infusion 5,500 Units (5,500 Units Intravenous Given 10/22/15 1844)    New Prescriptions   No medications on file    CRITICAL CARE Performed by: Recardo Evangelist   Total critical care time: 45 minutes  Critical care time was exclusive of separately billable procedures and treating other patients.  Critical care was necessary to treat or prevent imminent or life-threatening deterioration.  Critical care was time spent personally by me on the following  activities: development of treatment plan with patient and/or surrogate as well as nursing, discussions with consultants, evaluation of patient's response to treatment, examination of patient,  obtaining history from patient or surrogate, ordering and performing treatments and interventions, ordering and review of laboratory studies, ordering and review of radiographic studies, pulse oximetry and re-evaluation of patient's condition.  MDM   Final diagnoses:  Pulmonary embolism, other ([place])  Diabetes mellitus without complication ([place])  PE (physical exam), annual   70 year old female with acute onset of shortness of breath and chest pain. She is tachypneic, hypotensive, and hypoxic in to the low 90's without O2. Sats have improved to 95-97% on 2L O2 on [place]. CT of chest shows bilateral PEs. Heparin drip started. She has bilateral pitting edema without calf tenderness. EKG is NSR. CXR is negative. Initial troponin is 0.02 and repeat is .15 - most likely due to strain from PE. BMP is remarkable for hyperglycemia - patient is a known diabetic. CBC is remarkable for leukopenia of 3.5. Patient is currently on chemotherapy. She also has mild anemia.  Shared visit with Dr. Rogene Houston and Dr. Darl Householder. Consulted Dr. Blaine Hamper for admission and Dr. Darl Householder spoke with [facility] to ask for their input as well. Will have patient admitted to stepdown for further management.     Recardo Evangelist, PA-C 10/23/15 1037

## 2015-10-22 NOTE — ED Notes (Signed)
Patient ambulated in hallway on room air.  Sustained O2 saturation of 89% on room air with increased work of breathing.  Saturation returned to 97% after sitting back in room with O2 via nasal cannula.

## 2015-10-22 NOTE — ED Provider Notes (Signed)
ED ECG REPORT   Date: 10/22/2015  Rate: 84  Rhythm: normal sinus rhythm  QRS Axis: normal  Intervals: normal  ST/T Wave abnormalities: normal  Conduction Disutrbances:none  Narrative Interpretation:   Old EKG Reviewed: none available  I have personally reviewed the EKG tracing and agree with the computerized printout as noted.  Will not crossover from [place], MD 10/22/15 1257

## 2015-10-22 NOTE — ED Notes (Addendum)
Per [facility] patient complains of shortness of breath and chest pain that started 10 am.  On arrival of first responders, they state patient O2 saturation was 90%, at which point she was placed on a NRB.  EMS states that when they arrived to home patient stated chest pain and SOB had resolved.  During triage, patient O2 92% on room air.  Patient alert and oriented.  Patient took 324mg  aspirin prior to EMS arrival.

## 2015-10-23 ENCOUNTER — Inpatient Hospital Stay (HOSPITAL_COMMUNITY): Payer: Medicare Other

## 2015-10-23 ENCOUNTER — Encounter (HOSPITAL_COMMUNITY): Payer: Medicare Other

## 2015-10-23 DIAGNOSIS — R000 Tachycardia, unspecified: Secondary | ICD-10-CM

## 2015-10-23 DIAGNOSIS — I2602 Saddle embolus of pulmonary artery with acute cor pulmonale: Secondary | ICD-10-CM

## 2015-10-23 DIAGNOSIS — R7989 Other specified abnormal findings of blood chemistry: Secondary | ICD-10-CM

## 2015-10-23 DIAGNOSIS — R0902 Hypoxemia: Secondary | ICD-10-CM

## 2015-10-23 DIAGNOSIS — I2699 Other pulmonary embolism without acute cor pulmonale: Secondary | ICD-10-CM

## 2015-10-23 LAB — HEPARIN LEVEL (UNFRACTIONATED)
HEPARIN UNFRACTIONATED: 0.89 [IU]/mL — AB (ref 0.30–0.70)
Heparin Unfractionated: 0.71 IU/mL — ABNORMAL HIGH (ref 0.30–0.70)
Heparin Unfractionated: 0.6 IU/mL (ref 0.30–0.70)

## 2015-10-23 LAB — CBC
HEMATOCRIT: 31.5 % — AB (ref 36.0–46.0)
Hemoglobin: 10.1 g/dL — ABNORMAL LOW (ref 12.0–15.0)
MCH: 28.4 pg (ref 26.0–34.0)
MCHC: 32.1 g/dL (ref 30.0–36.0)
MCV: 88.5 fL (ref 78.0–100.0)
PLATELETS: 199 10*3/uL (ref 150–400)
RBC: 3.56 MIL/uL — ABNORMAL LOW (ref 3.87–5.11)
RDW: 21.1 % — AB (ref 11.5–15.5)
WBC: 4.3 10*3/uL (ref 4.0–10.5)

## 2015-10-23 LAB — ECHOCARDIOGRAM COMPLETE
AVLVOTPG: 13 mmHg
CHL CUP DOP CALC LVOT VTI: 39.6 cm
CHL CUP MV DEC (S): 271
CHL CUP TV REG PEAK VELOCITY: 335 cm/s
E decel time: 271 msec
EERAT: 9.99
FS: 44 % (ref 28–44)
Height: 69 in
IV/PV OW: 0.88
LA diam end sys: 35 mm
LA diam index: 1.32 cm/m2
LA vol A4C: 42.1 ml
LA vol: 56 mL
LASIZE: 35 mm
LAVOLIN: 21.2 mL/m2
LDCA: 2.84 cm2
LV E/e' medial: 9.99
LV E/e'average: 9.99
LV PW d: 13.8 mm — AB (ref 0.6–1.1)
LV dias vol index: 33 mL/m2
LV e' LATERAL: 8.7 cm/s
LV sys vol: 32 mL (ref 14–42)
LVDIAVOL: 88 mL (ref 46–106)
LVOT peak vel: 182 cm/s
LVOTD: 19 mm
LVOTSV: 112 mL
LVSYSVOLIN: 12 mL/m2
Lateral S' vel: 9.57 cm/s
MV pk A vel: 112 m/s
MV pk E vel: 86.9 m/s
MVPG: 3 mmHg
RV TAPSE: 21.9 mm
RV sys press: 60 mmHg
Simpson's disk: 64
Stroke v: 56 ml
TDI e' lateral: 8.7
TDI e' medial: 6.64
TRMAXVEL: 335 cm/s
Weight: 4814.85 oz

## 2015-10-23 LAB — BASIC METABOLIC PANEL
Anion gap: 6 (ref 5–15)
BUN: 12 mg/dL (ref 6–20)
CO2: 25 mmol/L (ref 22–32)
CREATININE: 0.77 mg/dL (ref 0.44–1.00)
Calcium: 8.6 mg/dL — ABNORMAL LOW (ref 8.9–10.3)
Chloride: 107 mmol/L (ref 101–111)
GFR calc Af Amer: >60 mL/min (ref >60)
GFR calc non Af Amer: >60 mL/min (ref >60)
Glucose, Bld: 97 mg/dL (ref 65–99)
POTASSIUM: 3.9 mmol/L (ref 3.5–5.1)
SODIUM: 138 mmol/L (ref 135–145)

## 2015-10-23 LAB — GLUCOSE, CAPILLARY
GLUCOSE-CAPILLARY: 180 mg/dL — AB (ref 65–99)
Glucose-Capillary: 143 mg/dL — ABNORMAL HIGH (ref 65–99)
Glucose-Capillary: 203 mg/dL — ABNORMAL HIGH (ref 65–99)
Glucose-Capillary: 175 mg/dL — ABNORMAL HIGH (ref 65–99)

## 2015-10-23 LAB — TROPONIN I
TROPONIN I: 0.1 ng/mL — AB (ref <0.031)
Troponin I: 0.15 ng/mL — ABNORMAL HIGH (ref <0.031)

## 2015-10-23 LAB — MRSA PCR SCREENING: MRSA by PCR: NEGATIVE

## 2015-10-23 MED ORDER — LEVALBUTEROL HCL 0.63 MG/3ML IN NEBU: 0.6300 mg | INHALATION_SOLUTION | Freq: Four times a day (QID) | RESPIRATORY_TRACT | Status: DC | PRN

## 2015-10-23 NOTE — Evaluation (Signed)
Occupational Therapy Evaluation Patient Details Name: Tonya Alvarez MRN: [id number] DOB: 16-Oct-1945 Today's Date: 10/23/2015    History of Present Illness 70 year old female with history of metastatic breast cancer followed by Dr. Lindi Adie, presents with dyspnea and chest pain, CTA chest significant for bilateral PE   Clinical Impression   Pt reports she was independent with ADLs PTA. Currently pt overall min guard for safety with ADLs and functional mobility. Pt reports increased SOB with functional mobility, SpO2 down to 88% on RA directly following functional mobility; returned to mid 90s with supplemental O2. Educated pt on energy conservation strategies and provided handout. Pt planning to d/c home with supervision from family. Pt would benefit from continued skilled OT to address established goals.    Follow Up Recommendations  No OT follow up;Supervision - Intermittent    Equipment Recommendations  None recommended by OT    Recommendations for Other Services       Precautions / Restrictions Precautions Precaution Comments: watch O2 saturations Restrictions Weight Bearing Restrictions: No      Mobility Bed Mobility               General bed mobility comments: sitting EOB upon arrival  Transfers Overall transfer level: Needs assistance   Transfers: Sit to/from Stand;Stand Pivot Transfers Sit to Stand: Min guard Stand pivot transfers: Supervision       General transfer comment: min guard for transfer to full standing position. Supervision for transfer to and from [facility] for safety and line management    Balance Overall balance assessment: Needs assistance   Sitting balance-Leahy Scale: Fair       Standing balance-Leahy Scale: Fair Standing balance comment: reliance on UE support due to peripheral neuropathy                            ADL Overall ADL's : Needs assistance/impaired Eating/Feeding: Set up;Sitting   Grooming: Min  guard;Standing   Upper Body Bathing: Set up;Supervision/ safety;Sitting   Lower Body Bathing: Min guard;Sit to/from stand   Upper Body Dressing : Set up;Supervision/safety;Sitting   Lower Body Dressing: Min guard;Sit to/from stand Lower Body Dressing Details (indicate cue type and reason): Pt able to doff/don underwear and don slippers with min guard assist for safety. Increased SOB with leaning. Toilet Transfer: Supervision/safety;Stand-pivot;BSC   Toileting- Water quality scientist and Hygiene: Min guard;Sit to/from stand       Functional mobility during ADLs: Min guard;+2 for safety/equipment General ADL Comments: Pt with increased SOB during functional mobililty. SpO2 down to 88 on RA directly following functional mobility. Returned to min 90s on 2L O2 via nasal canula. Educated pt on energy conservation strategies and provided handout.      Vision Vision Assessment?: No apparent visual deficits   Perception     Praxis      Pertinent Vitals/Pain Pain Assessment: No/denies pain     Hand Dominance Right   Extremity/Trunk Assessment Upper Extremity Assessment Upper Extremity Assessment: Generalized weakness (limited ROM due to body habitus)   Lower Extremity Assessment Lower Extremity Assessment: Generalized weakness;RLE deficits/detail;LLE deficits/detail (limited ROM due to body habitus) RLE Sensation: history of peripheral neuropathy LLE Sensation: history of peripheral neuropathy   Cervical / Trunk Assessment Cervical / Trunk Assessment:  (increased body habitus)   Communication Communication Communication: No difficulties   Cognition Arousal/Alertness: Awake/alert Behavior During Therapy: WFL for tasks assessed/performed Overall Cognitive Status: Within Functional Limits for tasks assessed  General Comments       Exercises       Shoulder Instructions      Home Living Family/patient expects to be discharged to:: Private  residence Living Arrangements: Spouse/significant other Available Help at Discharge: Family Type of Home: House Home Access: Stairs to enter Technical brewer of Steps: 1   [place]: One level     Bathroom Shower/Tub: Occupational psychologist: Standard Bathroom Accessibility: Yes   Home Equipment: Environmental consultant - 2 wheels;Bedside commode;Grab bars - tub/shower          Prior Functioning/Environment Level of Independence: Independent             OT Diagnosis: Generalized weakness   OT Problem List: Decreased strength;Decreased activity tolerance;Impaired balance (sitting and/or standing);Cardiopulmonary status limiting activity;Obesity   OT Treatment/Interventions: Self-care/ADL training;Therapeutic exercise;Energy conservation;Therapeutic activities;Patient/family education;Balance training    OT Goals(Current goals can be found in the care plan section) Acute Rehab OT Goals Patient Stated Goal: to go home (Simultaneous filing. User may not have seen previous data.) OT Goal Formulation: With patient Time For Goal Achievement: 11/06/15 Potential to Achieve Goals: Good ADL Goals Pt Will Perform Grooming: with modified independence;standing Pt Will Perform Upper Body Bathing: with modified independence;standing Pt Will Perform Lower Body Bathing: with modified independence;sit to/from stand Pt Will Transfer to Toilet: with modified independence;ambulating;regular height toilet Pt Will Perform Toileting - Clothing Manipulation and hygiene: with modified independence;sit to/from stand Pt/caregiver will Perform Home Exercise Program: Increased strength;Both right and left upper extremity;With theraband;Independently;With written HEP provided Additional ADL Goal #1: Pt will independently verbally recall 3 energy conservation techniques.  OT Frequency: Min 2X/week   Barriers to D/C:            Co-evaluation              End of Session Equipment Utilized  During Treatment: Oxygen  Activity Tolerance: Patient tolerated treatment well Patient left: in chair;with call bell/phone within reach   Time: [id number] OT Time Calculation (min): 21 min Charges:  OT General Charges $OT Visit: 1 Procedure OT Evaluation $OT Eval Moderate Complexity: 1 Procedure G-Codes:     Binnie Kand M.S., OTR/L Pager: [phone]  10/23/2015, 4:38 PM

## 2015-10-23 NOTE — Progress Notes (Signed)
ANTICOAGULATION CONSULT NOTE - Follow Up  Pharmacy Consult for Heparin Indication: pulmonary embolus  Allergies  Allergen Reactions  . Amoxicillin Nausea And Vomiting  . Hydrocodone Itching  . Lisinopril Cough       Assessment: 36 YOF with obesity and hx breast CA who presented to the [facility] on 6/17 with SOB and CP and persistent hypoxia. CTA showed a massive B/L PE with R-heart strain. Pharmacy consulted to start heparin for anticoagulation.   Hgb 10.1 > down slightly, plts wnl (199K).  No noted bleeding complication.  She has been therapeutic at 1700 units/hr but is trending upward.  HL now at 0.89 which is above our goal range.     Goal of Therapy:  Heparin level 0.3-0.7 units/ml Monitor platelets by anticoagulation protocol: Yes  Plan:  Reduce heparin at a drip rate of 1600 units/hr (16 ml/hr) Will continue to monitor for any signs/symptoms of bleeding and will follow up with heparin level in 6 hours  Will check an 8 hour level to ensure that we are within target goal range  Change to LMWH at 135 mg sq every 12 hours once invasive procedures completed.  Rober Minion, PharmD., MS Clinical Pharmacist Pager:  [phone] Thank you for allowing pharmacy to be part of this patients care team.

## 2015-10-23 NOTE — Progress Notes (Signed)
ANTICOAGULATION CONSULT NOTE - Follow Up Consult  Pharmacy Consult for heparin Indication: pulmonary embolus  Labs:  Recent Labs  10/22/15 1307 10/22/15 2039 10/23/15 0212  HGB 11.2*  --  10.1*  HCT 34.4*  --  31.5*  PLT 218  --  199  APTT  --  114*  --   LABPROT  --  15.2  --   INR  --  1.19  --   HEPARINUNFRC  --   --  0.71*  CREATININE 0.96  --  0.77  TROPONINI  --  0.16*  --      Assessment.Plan:  70yo female slightly supratherapeutic on heparin with initial dosing for massive PE. Will continue gtt at current rate as expect level to decrease after large bolus and confirm stable with additional level.   Wynona Neat, PharmD, BCPS  10/23/2015,2:56 AM

## 2015-10-23 NOTE — Progress Notes (Signed)
ANTICOAGULATION CONSULT NOTE - Follow Up Consult  Pharmacy Consult for Heparin Indication: pulmonary embolus  Allergies  Allergen Reactions  . Amoxicillin Nausea And Vomiting  . Hydrocodone Itching  . Lisinopril Cough    Patient Measurements: Height: 5\' 9"  (175.3 cm) Weight: (!) 300 lb 14.9 oz (136.5 kg) IBW/kg (Calculated) : 66.2 Heparin Dosing Weight: 99 kg  Vital Signs: Temp: 97.7 F (36.5 C) (06/18 2021) Temp Source: Oral (06/18 2021) BP: 107/56 mmHg (06/18 2021) Pulse Rate: 65 (06/18 2021)  Labs:  Recent Labs  10/22/15 1307 10/22/15 2039 10/23/15 0212 10/23/15 0758 10/23/15 1222 10/23/15 2113  HGB 11.2*  --  10.1*  --   --   --   HCT 34.4*  --  31.5*  --   --   --   PLT 218  --  199  --   --   --   APTT  --  114*  --   --   --   --   LABPROT  --  15.2  --   --   --   --   INR  --  1.19  --   --   --   --   HEPARINUNFRC  --   --  0.71*  --  0.89* 0.60  CREATININE 0.96  --  0.77  --   --   --   TROPONINI  --  0.16* 0.15* 0.10*  --   --     Estimated Creatinine Clearance: 101.6 mL/min (by C-G formula based on Cr of 0.77).   Medications:  Heparin @ 1600 units/hr (16 ml/hr)  Assessment: 89 YOF with obesity and hx breast CA who presented to the [facility] on 6/17 with SOB and CP and persistent hypoxia. CTA showed a massive B/L PE with R-heart strain. Pharmacy consulted to start heparin for anticoagulation.   Heparin level this evening is therapeutic after a rate decrease earlier today (HL 0.6 << 0.89, goal of 0.3-0.7). Hgb/Hct slight drop - no overt s/sx of bleeding noted.   MD considering transitioning to lovenox on 6/19 if the patient remains stable.   Goal of Therapy:  Heparin level 0.3-0.7 units/ml Monitor platelets by anticoagulation protocol: Yes   Plan:  1. Continue heparin at 1600 units/hr (16 ml/hr) 2. Will continue to monitor for any signs/symptoms of bleeding and will follow up with heparin level in the a.m.   Alycia Rossetti, PharmD,  BCPS Clinical Pharmacist Pager: [phone] 10/23/2015 9:48 PM

## 2015-10-23 NOTE — Progress Notes (Signed)
PULMONARY / CRITICAL CARE MEDICINE   Name: Tonya Alvarez MRN: [id number] DOB: [birth year]    ADMISSION DATE:  10/22/2015 CONSULTATION DATE:  10/22/15  REFERRING MD:  [facility]  CHIEF COMPLAINT:  Shortness of breath  HISTORY OF PRESENT ILLNESS:   Tonya Alvarez is a 48F with PMH significant for HTN, diabetes mellitus type II, HLD, arthritis, metastatic breast cancer on active faslodex with ibrance, and mitral regurge who presents with acute onset shortness of breath and chest pain that began this morning around 10am. Her sat was 90% on room air when EMS arrived. She did not fall or experience syncope. On arrival to the ED she was noted to be saturating well on 2L [employer]. Blood pressures were a little soft, with systolics in the [id number] (she usually runs 120s/60s), but they are taking the blood pressures on her forearm. CTA PE was obtained, which showed bilateral pulmonary emboli extending from the main pulmonary arteries into both lungs. RV/LV ratio was 1.8. Troponin was 0.02 to 0.15. BNP not yet obtained.   She reports that she had some shortness of breath on Thursday that sort of got better, but then today she had this chest pain and worse shortness of breath. The chest pain has since resolved. No syncope, light headedness or true hypotension. She has not had any recent surgeries, though she did have a dental extraction a few weeks ago. She has not had any GI bleeding, no history of brain mets or bleeds. No prior DVT or PE. No fever / chills / night sweats. No hemoptysis. No pain with inspiration.  SUBJECTIVE:  No events overnight.  VITAL SIGNS: BP 121/64 mmHg  Pulse 125  Temp(Src) 98.1 F (36.7 C) (Oral)  Resp 24  Ht 5\' 9"  (1.753 m)  Wt 136.5 kg (300 lb 14.9 oz)  BMI 44.42 kg/m2  SpO2 91%  HEMODYNAMICS:    VENTILATOR SETTINGS:    INTAKE / OUTPUT: I/O last 3 completed shifts: In: 986 [I.V.:986] Out: -   PHYSICAL EXAMINATION: Physical Exam: Temp:  [97.3 F (36.3 C)-98.6 F (37 C)] 98.1 F  (36.7 C) (06/18 0755) Pulse Rate:  [33-125] 125 (06/18 0755) Resp:  [16-26] 24 (06/18 0755) BP: (88-148)/(32-89) 121/64 mmHg (06/18 0755) SpO2:  [91 %-100 %] 91 % (06/18 0755) Weight:  [136.5 kg (300 lb 14.9 oz)] 136.5 kg (300 lb 14.9 oz) (06/17 2221)  General Well nourished, well developed, no apparent distress, breathing comfortably on RA.  HEENT No gross abnormalities. Oropharynx clear. Mallampati IV, adequate dentition.   Pulmonary Clear to auscultation bilaterally with no wheezes, rales or ronchi. Good effort, symmetrical expansion. Equal resonance and fremitus.   Cardiovascular Normal rate, regular rhythm. S1, s2. No m/r/g. Distal pulses palpable.  Abdomen Obese, soft, non-tender, non-distended, positive bowel sounds, no palpable organomegaly or masses. Normoresonant to percussion.  Musculoskeletal Grossly normal.   Lymphatics No cervical, supraclavicular or axillary adenopathy.   Neurologic Grossly intact. No focal deficits.   Skin/Integuement No rash, no cyanosis, no clubbing. R port-a-cath without erythema or swelling. Trace lower extremity edema bilaterally.     LABS:  BMET  Recent Labs Lab 10/22/15 1307 10/23/15 0212  NA 137 138  K 4.6 3.9  CL 105 107  CO2 24 25  BUN 18 12  CREATININE 0.96 0.77  GLUCOSE 170* 97    Electrolytes  Recent Labs Lab 10/22/15 1307 10/23/15 0212  CALCIUM 9.1 8.6*    CBC  Recent Labs Lab 10/22/15 1307 10/23/15 0212  WBC 3.5* 4.3  HGB  11.2* 10.1*  HCT 34.4* 31.5*  PLT 218 199    Coag's  Recent Labs Lab 10/22/15 2039  APTT 114*  INR 1.19    Sepsis Markers No results for input(s): LATICACIDVEN, PROCALCITON, O2SATVEN in the last 168 hours.  ABG No results for input(s): PHART, PCO2ART, PO2ART in the last 168 hours.  Liver Enzymes No results for input(s): AST, ALT, ALKPHOS, BILITOT, ALBUMIN in the last 168 hours.  Cardiac Enzymes  Recent Labs Lab 10/22/15 2039 10/23/15 0212 10/23/15 0758  TROPONINI 0.16*  0.15* 0.10*    Glucose  Recent Labs Lab 10/22/15 2229 10/23/15 0814  GLUCAP 124* 143*    Imaging Dg Chest 2 View  10/22/2015  CLINICAL DATA:  Central chest pain and shortness of breath. EXAM: CHEST  2 VIEW COMPARISON:  PET-CT 08/08/2015 FINDINGS: Right-sided injectable port terminates within the expected location of superior vena cava. Cardiomediastinal silhouette is normal. Mediastinal contours appear intact. There is no evidence of focal airspace consolidation, pleural effusion or pneumothorax. Osseous structures are without acute abnormality. Soft tissues are grossly normal. IMPRESSION: No active cardiopulmonary disease. Electronically Signed   By: Fidela Salisbury M.D.   On: 10/22/2015 13:49   Ct Angio Chest Pe W/cm &/or Wo Cm  10/22/2015  CLINICAL DATA:  Patient with shortness of breath and chest pain. Low oxygen saturation. EXAM: CT ANGIOGRAPHY CHEST WITH CONTRAST TECHNIQUE: Multidetector CT imaging of the chest was performed using the standard protocol during bolus administration of intravenous contrast. Multiplanar CT image reconstructions and MIPs were obtained to evaluate the vascular anatomy. CONTRAST:  100 cc Isovue 370 COMPARISON:  PET-CT 08/08/2015. FINDINGS: Mediastinum/Nodes: The thyroid is heterogeneous in attenuation. Right anterior chest wall Port-A-Cath is present with tip terminating in the right atrium. No axillary, mediastinal or hilar lymphadenopathy. Heart is enlarged. Coronary arterial vascular calcifications. Main pulmonary artery is enlarged measuring 3.7 cm. Extensive bilateral pulmonary emboli are demonstrated from the level of the main right and left pulmonary arteries and extending throughout the right and left lungs bilaterally. RV/LV ratio: 1.8. Esophagus is unremarkable. Lungs/Pleura: Central airways are patent. Interval development of irregular subpleural consolidation within the right upper lobe. Additionally there is a 2 cm patchy area of ground-glass  attenuation within the right upper lobe (image 26; series 5). No pleural effusion or pneumothorax. No pleural effusion or pneumothorax. Upper abdomen: Hepatic steatosis. Reflux of contrast into the hepatic veins. Unchanged 3.1 cm right adrenal adenoma. Small hiatal hernia. Musculoskeletal: Interval development of a 1.5 cm lytic lesion within the T5 vertebral body. Patchy lucency and sclerosis is demonstrated throughout the visualized thoracic spine. Re- demonstrated postsurgical changes involving the chest wall anteriorly. Re- demonstrated seroma within the anterior left chest wall. Review of the MIP images confirms the above findings. IMPRESSION: Massive bilateral pulmonary emboli beginning within the main right and main left pulmonary arteries extending throughout the lungs bilaterally. Evidence for right heart strain with RV/LV ratio of 1.8 and reflux of contrast into the hepatic veins. The presence of right heart strain has been associated with an increased risk of morbidity and mortality. Please activate Code PE by paging [phone]. Subpleural consolidation within the right upper lobe and an adjacent focal area of ground-glass attenuation which may be secondary to radiation change, infarct from pulmonary emboli or infectious/inflammatory process. Metastatic disease not favored however not excluded. Recommend attention on followup. T5 lytic lesion compatible with osseous metastasis. Critical Value/emergent results were called by telephone at the time of interpretation on 10/22/2015 at 6:14 pm to Dr. Darl Householder,  who verbally acknowledged these results. Electronically Signed   By: Lovey Newcomer M.D.   On: 10/22/2015 18:21     STUDIES:    CULTURES: None  ANTIBIOTICS: none  SIGNIFICANT EVENTS:   LINES/TUBES: PIV  I reviewed chest CT myself, PE noted.  DISCUSSION: Tonya Alvarez is a 54F with metastatic breast cancer and associated pulmonary emboli. She has no prior history of clotting disorder. By CT  criteria, there is concern for right heart strain, although clinically she does not appear to have suffered a submassive PE. I had a long discussion with her at bedside about the risks and benefits of conservative treatment (Lovenox) vs catheter directed thrombolysis followed by long-term anticoagulation. She understands the potential risks and benefits and has opted for conservative management. Given her metastatic disease, I think this is a judicious choice. I would recommend long-term anticoagulation with lovenox in the setting of active malignancy. Lower extremity dopplers are certainly reasonable and, if significant clot burden is identified, more aggressive management might be warranted. She will need a TTE and BNP as well.   RECOMMENDATIONS:  Therapeutic anticoagulation (LMWH)  Transition to coumadin or NOAC at patient's wishes.   TTE, BNP, LE dopplers to complete evaluation.  Supplemental oxygen as needed to maintain saturations >90%, now on RA.  Walk for desat prior to discharge  Discussed with PCCM-NP.  PCCM will sign off, please call back if needed.  Rush Farmer, M.D. [facility] Pulmonary/Critical Care Medicine. Pager: [phone]. After hours pager: [phone].  10/23/2015, 12:34 PM

## 2015-10-23 NOTE — Progress Notes (Signed)
PROGRESS NOTE                                                                                                                                                                                                             Patient Demographics:    Tonya Alvarez, is a 70 y.o. female, DOB - 20-Nov-1945, [id number]  Admit date - 10/22/2015   Admitting Physician Ivor Costa, MD  Outpatient Primary MD for the patient is Jacelyn Pi, MD  LOS - 1  Chief Complaint  Patient presents with  . Shortness of Breath       Brief Narrative  70 year old female with history of metastatic breast cancer followed by Dr. Lindi Adie, presents with dyspnea and chest pain, CTA chest significant for bilateral PE, admitted for further workup at anticoagulation.   Subjective:    Tonya Alvarez today has, No headache,No abdominal pain Which is feeling much better today, dyspnea significantly subsided, no further chest pain   Assessment  & Plan :    Principal Problem:   Pulmonary embolism (HCC) Active Problems:   Breast cancer of upper-outer quadrant of right female breast ([place])   Hypertension   Bone metastases ([place])   Port catheter in place   Diabetes mellitus without complication ([place])   PE (pulmonary embolism)   Elevated troponin  Pulmonary embolism - CTA chest significant for massive bilaterally emboli with evidence of right heart strain. 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 - Continue with heparin GTT today, will ambulate patient, if remains stable, with no hypoxia, will transition to subcutaneous Lovenox tomorrow. - Venous doppler   Abnormal echo finding of highly mobile atrial septal  aneurysm with some calcification and PFO. - Will await venous Dopplers, if is negative, then we'll request cardiology to perform TEE or possibility of anteroseptal aneurysm being the source of PE, migrated from left to right through PFO.  Elevated troponin - The setting of PE  Metastatic breast cancer - s/p of bilateral mastectomy, s/p of radiation therapy. Has bone metastases. Currently on chemotherapy, Ibrance 21-day on  and 7-day off; and monthly Faslodex (last dose was on 10/10/15). Pt is followed by Dr. Lindi Adie, last seen was on 10/10/15. - continue current chemotherapy - f/u with Dr. Lindi Adie  Hypertension - To neutral hold Cozaar, on when necessary hydralazine  Diabetes mellitus - Continue with insulin sliding-scale, continue with insulin 70/30 , 40 units twice a day  Hyperlipidemia - Cont with  Zocor   Code Status : Full  Family Communication  : none at bedside  Disposition Plan  : home  Consults  : PCCM  Procedures  : none  DVT Prophylaxis  :  Heparin GTT  Lab Results  Component Value Date   PLT 199 10/23/2015    Antibiotics  :    Anti-infectives    None        Objective:   Filed Vitals:   10/23/15 0341 10/23/15 0400 10/23/15 0755 10/23/15 1200  BP:  104/54 121/64 130/62  Pulse:  33 125 66  Temp: 97.4 F (36.3 C)  98.1 F (36.7 C) 97.7 F (36.5 C)  TempSrc: Oral  Oral Oral  Resp:  19 24 20   Height:      Weight:      SpO2:  96% 91% 96%    Wt Readings from Last 3 Encounters:  10/22/15 136.5 kg (300 lb 14.9 oz)  10/10/15 137.349 kg (302 lb 12.8 oz)  09/12/15 137.939 kg (304 lb 1.6 oz)     Intake/Output Summary (Last 24 hours) at 10/23/15 1430 Last data filed at 10/23/15 0600  Gross per 24 hour  Intake 985.99 ml  Output      0 ml  Net 985.99 ml     Physical Exam  Awake Alert, Oriented X 3, No new F.N deficits, Normal affect [place].AT,PERRAL Supple Neck,No JVD, No cervical lymphadenopathy appriciated.  Symmetrical Chest wall movement, Good  air movement bilaterally, CTAB RRR,No Gallops,Rubs or new Murmurs, No Parasternal Heave +ve B.Sounds, Abd Soft, No tenderness, No organomegaly appriciated, No rebound - guarding or rigidity. No Cyanosis, Clubbing or edema, No new Rash or bruise      Data Review:    CBC  Recent Labs Lab 10/22/15 1307 10/23/15 0212  WBC 3.5* 4.3  HGB 11.2* 10.1*  HCT 34.4* 31.5*  PLT 218 199  MCV 88.9 88.5  MCH 28.9 28.4  MCHC 32.6 32.1  RDW 21.2* 21.1*    Chemistries   Recent Labs Lab 10/22/15 1307 10/23/15 0212  NA 137 138  K 4.6 3.9  CL 105 107  CO2 24 25  GLUCOSE 170* 97  BUN 18 12  CREATININE 0.96 0.77  CALCIUM 9.1 8.6*   ------------------------------------------------------------------------------------------------------------------ No results for input(s): CHOL, HDL, LDLCALC, TRIG, CHOLHDL, LDLDIRECT in the last 72 hours.  Lab Results  Component Value Date   HGBA1C * 02/09/2008    7.3 (NOTE)   The ADA recommends the following therapeutic goal for glycemic   control related to Hgb A1C measurement:   Goal of Therapy:   < 7.0% Hgb A1C   Reference: American Diabetes Association: Clinical Practice   Recommendations 2008, Diabetes Care,  2008, 31:(Suppl 1).   ------------------------------------------------------------------------------------------------------------------ No results for input(s): TSH, T4TOTAL, T3FREE, THYROIDAB in the last 72 hours.  Invalid input(s): FREET3 ------------------------------------------------------------------------------------------------------------------ No results for input(s): VITAMINB12, FOLATE, FERRITIN, TIBC, IRON, RETICCTPCT in the last 72 hours.  Coagulation profile  Recent Labs Lab 10/22/15 2039  INR 1.19    No results for input(s): DDIMER in the last 72 hours.  Cardiac Enzymes  Recent Labs Lab  10/22/15 2039 10/23/15 0212 10/23/15 0758  TROPONINI 0.16* 0.15* 0.10*    ------------------------------------------------------------------------------------------------------------------    Component Value Date/Time   BNP 119.6* 10/22/2015 2217    Inpatient Medications  Scheduled Meds: . aspirin EC  81 mg Oral QHS  . calcium-vitamin D  1 tablet Oral Daily  . gabapentin  300 mg Oral TID  . ibuprofen  600 mg Oral Q12H  . insulin aspart  0-9 Units Subcutaneous TID WC  . insulin aspart protamine- aspart  40 Units Subcutaneous BID WC  . multivitamin with minerals  1 tablet Oral QHS  . palbociclib  125 mg Oral Q breakfast  . simvastatin  20 mg Oral QHS  . sodium chloride flush  3 mL Intravenous Q12H   Continuous Infusions: . sodium chloride 75 mL/hr at 10/23/15 0724  . heparin 1,600 Units/hr (10/23/15 1334)   PRN Meds:.acetaminophen **OR** acetaminophen, dextromethorphan-guaiFENesin, fluticasone, hydrALAZINE, levalbuterol, lidocaine-prilocaine, morphine injection, ondansetron, oxyCODONE  Micro Results Recent Results (from the past 240 hour(s))  MRSA PCR Screening     Status: None   Collection Time: 10/22/15 10:14 PM  Result Value Ref Range Status   MRSA by PCR NEGATIVE NEGATIVE Final    Comment:        The GeneXpert MRSA Assay (FDA approved for NASAL specimens only), is one component of a comprehensive MRSA colonization surveillance program. It is not intended to diagnose MRSA infection nor to guide or monitor treatment for MRSA infections.     Radiology Reports Dg Chest 2 View  10/22/2015  CLINICAL DATA:  Central chest pain and shortness of breath. EXAM: CHEST  2 VIEW COMPARISON:  PET-CT 08/08/2015 FINDINGS: Right-sided injectable port terminates within the expected location of superior vena cava. Cardiomediastinal silhouette is normal. Mediastinal contours appear intact. There is no evidence of focal airspace consolidation, pleural effusion or pneumothorax. Osseous structures are without acute abnormality. Soft tissues are grossly normal.  IMPRESSION: No active cardiopulmonary disease. Electronically Signed   By: Fidela Salisbury M.D.   On: 10/22/2015 13:49   Ct Angio Chest Pe W/cm &/or Wo Cm  10/22/2015  CLINICAL DATA:  Patient with shortness of breath and chest pain. Low oxygen saturation. EXAM: CT ANGIOGRAPHY CHEST WITH CONTRAST TECHNIQUE: Multidetector CT imaging of the chest was performed using the standard protocol during bolus administration of intravenous contrast. Multiplanar CT image reconstructions and MIPs were obtained to evaluate the vascular anatomy. CONTRAST:  100 cc Isovue 370 COMPARISON:  PET-CT 08/08/2015. FINDINGS: Mediastinum/Nodes: The thyroid is heterogeneous in attenuation. Right anterior chest wall Port-A-Cath is present with tip terminating in the right atrium. No axillary, mediastinal or hilar lymphadenopathy. Heart is enlarged. Coronary arterial vascular calcifications. Main pulmonary artery is enlarged measuring 3.7 cm. Extensive bilateral pulmonary emboli are demonstrated from the level of the main right and left pulmonary arteries and extending throughout the right and left lungs bilaterally. RV/LV ratio: 1.8. Esophagus is unremarkable. Lungs/Pleura: Central airways are patent. Interval development of irregular subpleural consolidation within the right upper lobe. Additionally there is a 2 cm patchy area of ground-glass attenuation within the right upper lobe (image 26; series 5). No pleural effusion or pneumothorax. No pleural effusion or pneumothorax. Upper abdomen: Hepatic steatosis. Reflux of contrast into the hepatic veins. Unchanged 3.1 cm right adrenal adenoma. Small hiatal hernia. Musculoskeletal: Interval development of a 1.5 cm lytic lesion within the T5 vertebral body. Patchy lucency and sclerosis is demonstrated throughout the visualized thoracic spine. Re- demonstrated postsurgical changes involving the chest wall anteriorly. Re- demonstrated seroma within  the anterior left chest wall. Review of the  MIP images confirms the above findings. IMPRESSION: Massive bilateral pulmonary emboli beginning within the main right and main left pulmonary arteries extending throughout the lungs bilaterally. Evidence for right heart strain with RV/LV ratio of 1.8 and reflux of contrast into the hepatic veins. The presence of right heart strain has been associated with an increased risk of morbidity and mortality. Please activate Code PE by paging [phone]. Subpleural consolidation within the right upper lobe and an adjacent focal area of ground-glass attenuation which may be secondary to radiation change, infarct from pulmonary emboli or infectious/inflammatory process. Metastatic disease not favored however not excluded. Recommend attention on followup. T5 lytic lesion compatible with osseous metastasis. Critical Value/emergent results were called by telephone at the time of interpretation on 10/22/2015 at 6:14 pm to Dr. Darl Householder, who verbally acknowledged these results. Electronically Signed   By: Lovey Newcomer M.D.   On: 10/22/2015 18:21    Time Spent in minutes  25 minutes   Mario Voong M.D on 10/23/2015 at 2:30 PM  Between 7am to 7pm - Pager - [phone]  After 7pm go to [email or web address] - password [email or web address]  [facility] -  Office  [phone]

## 2015-10-23 NOTE — Evaluation (Addendum)
Physical Therapy Evaluation Patient Details Name: Tonya Alvarez MRN: [id number] DOB: 11/23/1945 Today's Date: 10/23/2015   History of Present Illness  70 year old female with history of metastatic breast cancer followed by Dr. Lindi Adie, presents with dyspnea and chest pain, CTA chest significant for bilateral PE  Clinical Impression  Patient demonstrates deficits in functional mobility as indicated below. Will benefit from continued skilled PT to address deficits and maximize function. Will see as indicated and progress as tolerated.    OF NOTE: Patient saturations on 3 liters at rest 96%, on room air at rest 91%; ambulated on room air with saturations dropping to 88%. ~2 minutes to rebound to >90% on room air, increased to 95% on 3 liters less than 1 minute upon reapplication of [place].  Follow Up Recommendations No PT follow up    Equipment Recommendations  None recommended by PT    Recommendations for Other Services       Precautions / Restrictions Precautions Precaution Comments: watch O2 saturations Restrictions Weight Bearing Restrictions: No      Mobility  Bed Mobility               General bed mobility comments: sitting EOB upon arrival  Transfers Overall transfer level: Needs assistance   Transfers: Sit to/from Stand;Stand Pivot Transfers Sit to Stand: Min guard Stand pivot transfers: Supervision       General transfer comment: min guard for transfer to full standing position. Supervision for transfer to and from [facility] for safety and line management  Ambulation/Gait Ambulation/Gait assistance: Min guard Ambulation Distance (Feet): 60 Feet Assistive device:  (holding IV pole) Gait Pattern/deviations: Step-through pattern;Decreased stride length;Shuffle;Drifts right/left;Wide base of support Gait velocity: decreased Gait velocity interpretation: Below normal speed for age/gender General Gait Details: Some instability noted with ambulation, reliance on IV pole  for stability as well as ocassional reach for furniture  Stairs            Wheelchair Mobility    Modified Rankin (Stroke Patients Only)       Balance Overall balance assessment: Needs assistance   Sitting balance-Leahy Scale: Fair       Standing balance-Leahy Scale: Fair Standing balance comment: reliance on UE support due to peripheral neuropathy                             Pertinent Vitals/Pain Pain Assessment: No/denies pain    Home Living Family/patient expects to be discharged to:: Private residence Living Arrangements: Spouse/significant other Available Help at Discharge: Family Type of Home: House Home Access: Stairs to enter   Technical brewer of Steps: 1 Home Layout: One level Home Equipment: Environmental consultant - 2 wheels;Bedside commode;Grab bars - tub/shower      Prior Function Level of Independence: Independent               Hand Dominance   Dominant Hand: Right    Extremity/Trunk Assessment   Upper Extremity Assessment: Generalized weakness (limited ROM due to body habitus)           Lower Extremity Assessment: Generalized weakness;RLE deficits/detail;LLE deficits/detail (limited ROM due to body habitus)      Cervical / Trunk Assessment:  (increased body habitus)  Communication   Communication: No difficulties  Cognition Arousal/Alertness: Awake/alert Behavior During Therapy: WFL for tasks assessed/performed Overall Cognitive Status: Within Functional Limits for tasks assessed  General Comments      Exercises        Assessment/Plan    PT Assessment Patient needs continued PT services  PT Diagnosis Difficulty walking   PT Problem List Decreased strength;Decreased activity tolerance;Decreased balance;Decreased mobility;Cardiopulmonary status limiting activity  PT Treatment Interventions DME instruction;Gait training;Functional mobility training;Therapeutic activities;Therapeutic  exercise;Balance training;Patient/family education   PT Goals (Current goals can be found in the Care Plan section) Acute Rehab PT Goals Patient Stated Goal: to go home (Simultaneous filing. User may not have seen previous data.) PT Goal Formulation: With patient Time For Goal Achievement: 11/06/15 Potential to Achieve Goals: Good    Frequency Min 3X/week   Barriers to discharge        Co-evaluation               End of Session Equipment Utilized During Treatment: Oxygen Activity Tolerance: Patient limited by fatigue Patient left: in chair;with call bell/phone within reach Nurse Communication: Mobility status         Time: [id number] PT Time Calculation (min) (ACUTE ONLY): 21 min   Charges:   PT Evaluation $PT Eval Moderate Complexity: 1 Procedure     PT G CodesDuncan Dull 11-18-2015, 4:38 PM Alben Deeds, [place] DPT  [phone]

## 2015-10-23 NOTE — Progress Notes (Signed)
  Echocardiogram 2D Echocardiogram has been performed.  Bobbye Charleston 10/23/2015, 11:36 AM

## 2015-10-24 ENCOUNTER — Inpatient Hospital Stay (HOSPITAL_COMMUNITY): Payer: Medicare Other

## 2015-10-24 DIAGNOSIS — I2699 Other pulmonary embolism without acute cor pulmonale: Principal | ICD-10-CM

## 2015-10-24 DIAGNOSIS — Z0000 Encounter for general adult medical examination without abnormal findings: Secondary | ICD-10-CM

## 2015-10-24 LAB — CBC
HEMATOCRIT: 31.7 % — AB (ref 36.0–46.0)
Hemoglobin: 10.2 g/dL — ABNORMAL LOW (ref 12.0–15.0)
MCH: 29 pg (ref 26.0–34.0)
MCHC: 32.2 g/dL (ref 30.0–36.0)
MCV: 90.1 fL (ref 78.0–100.0)
Platelets: 203 10*3/uL (ref 150–400)
RBC: 3.52 MIL/uL — AB (ref 3.87–5.11)
RDW: 21.4 % — ABNORMAL HIGH (ref 11.5–15.5)
WBC: 3.9 10*3/uL — AB (ref 4.0–10.5)

## 2015-10-24 LAB — BASIC METABOLIC PANEL
ANION GAP: 6 (ref 5–15)
BUN: 10 mg/dL (ref 6–20)
CHLORIDE: 107 mmol/L (ref 101–111)
CO2: 24 mmol/L (ref 22–32)
Calcium: 8.7 mg/dL — ABNORMAL LOW (ref 8.9–10.3)
Creatinine, Ser: 0.7 mg/dL (ref 0.44–1.00)
GFR calc Af Amer: >60 mL/min (ref >60)
GFR calc non Af Amer: >60 mL/min (ref >60)
GLUCOSE: 122 mg/dL — AB (ref 65–99)
POTASSIUM: 4 mmol/L (ref 3.5–5.1)
Sodium: 137 mmol/L (ref 135–145)

## 2015-10-24 LAB — GLUCOSE, CAPILLARY
GLUCOSE-CAPILLARY: 142 mg/dL — AB (ref 65–99)
GLUCOSE-CAPILLARY: 140 mg/dL — AB (ref 65–99)
GLUCOSE-CAPILLARY: 180 mg/dL — AB (ref 65–99)
Glucose-Capillary: 163 mg/dL — ABNORMAL HIGH (ref 65–99)

## 2015-10-24 LAB — HEPARIN LEVEL (UNFRACTIONATED): Heparin Unfractionated: 0.6 IU/mL (ref 0.30–0.70)

## 2015-10-24 MED ORDER — ENOXAPARIN SODIUM 150 MG/ML ~~LOC~~ SOLN
140.0000 mg | Freq: Two times a day (BID) | SUBCUTANEOUS | Status: DC
  Administered 2015-10-24 – 2015-10-26 (×5): 140 mg via SUBCUTANEOUS
  Filled 2015-10-24 (×5): qty 0.93

## 2015-10-24 NOTE — Progress Notes (Signed)
PROGRESS NOTE                                                                                                                                                                                                             Patient Demographics:    Tonya Alvarez, is a 70 y.o. female, DOB - February 22, 1946, [id number]  Admit date - 10/22/2015   Admitting Physician Ivor Costa, MD  Outpatient Primary MD for the patient is Jacelyn Pi, MD  LOS - 2  Chief Complaint  Patient presents with  . Shortness of Breath       Brief Narrative  70 y/o female with history of tobacco use, diabetes, metastatic breast cancer followed by Dr. Lindi Adie, presents with dyspnea and chest pain, CTA chest significant for bilateral PE, admitted for further workup at anticoagulation.   Subjective:   Reports feeling better, no acute chest pains.    Assessment  & Plan :    Principal Problem:   Pulmonary embolism (HCC) Active Problems:   Breast cancer of upper-outer quadrant of right female breast ([place])   Hypertension   Bone metastases ([place])   Port catheter in place   Diabetes mellitus without complication ([place])   PE (pulmonary embolism)   Elevated troponin  1. Acute Pulmonary embolism. CTA chest significant for massive bilaterally emboli with evidence of right heart strain. 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 anteroseptal aneurysm the source of her PE. Obtain cardiology eval for TEE. Obtain Venous doppler  -Pt was on IV heparin, will transition to subcutaneous Lovenox per pharmacy. Needs lifelong anticoagulation.    2. 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 LVEF 50-55%. Pulmonary artery peak pressure 60 mmHg -consulted cardiology for TEE  3. Elevated troponin The setting of PE.   4. Metastatic breast cancer s/p of  bilateral mastectomy, s/p of radiation therapy. Has bone metastases. Currently on chemotherapy, Ibrance 21-day on and 7-day off; and monthly Faslodex (last dose was on 10/10/15). Pt is followed by Dr. Lindi Adie, last seen was on 10/10/15. continue current chemotherapy, and f/u with Dr. Lindi Adie  5. Hypertension hold Cozaar, on when necessary hydralazine  6. Diabetes mellitus Continue with insulin sliding-scale, continue with insulin 70/30 , 40 units  twice a day  7. Acute hypoxic respiratory failure due to acute PE. Stable on nasal oxygen 4 LMP, will obtain desat study prior to discharge    Code Status : Full  Family Communication  : none at bedside  Disposition Plan  : home  Consults  : PCCM  Procedures  : none  DVT Prophylaxis  :  lovenox   Lab Results  Component Value Date   PLT 203 10/24/2015    Antibiotics  :    Anti-infectives    None        Objective:   Filed Vitals:   10/23/15 2021 10/24/15 0000 10/24/15 0049 10/24/15 0403  BP: 107/56  90/50 127/71  Pulse: 65 65 66 66  Temp: 97.7 F (36.5 C) 97.6 F (36.4 C)  97.4 F (36.3 C)  TempSrc: Oral Oral  Oral  Resp: 20 18 17 26   Height:      Weight:      SpO2: 99% 93% 92% 97%    Wt Readings from Last 3 Encounters:  10/22/15 136.5 kg (300 lb 14.9 oz)  10/10/15 137.349 kg (302 lb 12.8 oz)  09/12/15 137.939 kg (304 lb 1.6 oz)     Intake/Output Summary (Last 24 hours) at 10/24/15 0840 Last data filed at 10/24/15 0600  Gross per 24 hour  Intake   2350 ml  Output      0 ml  Net   2350 ml     Physical Exam  Awake Alert, Oriented X 3, No new F.N deficits, Normal affect [place].AT,PERRAL Supple Neck,No JVD, No cervical lymphadenopathy appriciated.  Symmetrical Chest wall movement, Good air movement bilaterally, CTAB RRR,No Gallops,Rubs or new Murmurs, No Parasternal Heave +ve B.Sounds, Abd Soft, No tenderness, No organomegaly appriciated, No rebound - guarding or rigidity. No Cyanosis, Clubbing or edema, No new Rash  or bruise      Data Review:    CBC  Recent Labs Lab 10/22/15 1307 10/23/15 0212 10/24/15 0431  WBC 3.5* 4.3 3.9*  HGB 11.2* 10.1* 10.2*  HCT 34.4* 31.5* 31.7*  PLT 218 199 203  MCV 88.9 88.5 90.1  MCH 28.9 28.4 29.0  MCHC 32.6 32.1 32.2  RDW 21.2* 21.1* 21.4*    Chemistries   Recent Labs Lab 10/22/15 1307 10/23/15 0212 10/24/15 0431  NA 137 138 137  K 4.6 3.9 4.0  CL 105 107 107  CO2 24 25 24   GLUCOSE 170* 97 122*  BUN 18 12 10   CREATININE 0.96 0.77 0.70  CALCIUM 9.1 8.6* 8.7*   ------------------------------------------------------------------------------------------------------------------ No results for input(s): CHOL, HDL, LDLCALC, TRIG, CHOLHDL, LDLDIRECT in the last 72 hours.  Lab Results  Component Value Date   HGBA1C * 02/09/2008    7.3 (NOTE)   The ADA recommends the following therapeutic goal for glycemic   control related to Hgb A1C measurement:   Goal of Therapy:   < 7.0% Hgb A1C   Reference: American Diabetes Association: Clinical Practice   Recommendations 2008, Diabetes Care,  2008, 31:(Suppl 1).   ------------------------------------------------------------------------------------------------------------------ No results for input(s): TSH, T4TOTAL, T3FREE, THYROIDAB in the last 72 hours.  Invalid input(s): FREET3 ------------------------------------------------------------------------------------------------------------------ No results for input(s): VITAMINB12, FOLATE, FERRITIN, TIBC, IRON, RETICCTPCT in the last 72 hours.  Coagulation profile  Recent Labs Lab 10/22/15 2039  INR 1.19    No results for input(s): DDIMER in the last 72 hours.  Cardiac Enzymes  Recent Labs Lab 10/22/15 2039 10/23/15 0212 10/23/15 0758  TROPONINI 0.16* 0.15* 0.10*   ------------------------------------------------------------------------------------------------------------------  Component Value Date/Time   BNP 119.6* 10/22/2015 2217     Inpatient Medications  Scheduled Meds: . aspirin EC  81 mg Oral QHS  . calcium-vitamin D  1 tablet Oral Daily  . gabapentin  300 mg Oral TID  . ibuprofen  600 mg Oral Q12H  . insulin aspart  0-9 Units Subcutaneous TID WC  . insulin aspart protamine- aspart  40 Units Subcutaneous BID WC  . multivitamin with minerals  1 tablet Oral QHS  . palbociclib  125 mg Oral Q breakfast  . simvastatin  20 mg Oral QHS  . sodium chloride flush  3 mL Intravenous Q12H   Continuous Infusions: . sodium chloride 75 mL/hr at 10/23/15 2109  . heparin 1,600 Units/hr (10/24/15 0519)   PRN Meds:.acetaminophen **OR** acetaminophen, dextromethorphan-guaiFENesin, fluticasone, hydrALAZINE, levalbuterol, lidocaine-prilocaine, morphine injection, ondansetron, oxyCODONE  Micro Results Recent Results (from the past 240 hour(s))  MRSA PCR Screening     Status: None   Collection Time: 10/22/15 10:14 PM  Result Value Ref Range Status   MRSA by PCR NEGATIVE NEGATIVE Final    Comment:        The GeneXpert MRSA Assay (FDA approved for NASAL specimens only), is one component of a comprehensive MRSA colonization surveillance program. It is not intended to diagnose MRSA infection nor to guide or monitor treatment for MRSA infections.     Radiology Reports Dg Chest 2 View  10/22/2015  CLINICAL DATA:  Central chest pain and shortness of breath. EXAM: CHEST  2 VIEW COMPARISON:  PET-CT 08/08/2015 FINDINGS: Right-sided injectable port terminates within the expected location of superior vena cava. Cardiomediastinal silhouette is normal. Mediastinal contours appear intact. There is no evidence of focal airspace consolidation, pleural effusion or pneumothorax. Osseous structures are without acute abnormality. Soft tissues are grossly normal. IMPRESSION: No active cardiopulmonary disease. Electronically Signed   By: Fidela Salisbury M.D.   On: 10/22/2015 13:49   Ct Angio Chest Pe W/cm &/or Wo Cm  10/22/2015   CLINICAL DATA:  Patient with shortness of breath and chest pain. Low oxygen saturation. EXAM: CT ANGIOGRAPHY CHEST WITH CONTRAST TECHNIQUE: Multidetector CT imaging of the chest was performed using the standard protocol during bolus administration of intravenous contrast. Multiplanar CT image reconstructions and MIPs were obtained to evaluate the vascular anatomy. CONTRAST:  100 cc Isovue 370 COMPARISON:  PET-CT 08/08/2015. FINDINGS: Mediastinum/Nodes: The thyroid is heterogeneous in attenuation. Right anterior chest wall Port-A-Cath is present with tip terminating in the right atrium. No axillary, mediastinal or hilar lymphadenopathy. Heart is enlarged. Coronary arterial vascular calcifications. Main pulmonary artery is enlarged measuring 3.7 cm. Extensive bilateral pulmonary emboli are demonstrated from the level of the main right and left pulmonary arteries and extending throughout the right and left lungs bilaterally. RV/LV ratio: 1.8. Esophagus is unremarkable. Lungs/Pleura: Central airways are patent. Interval development of irregular subpleural consolidation within the right upper lobe. Additionally there is a 2 cm patchy area of ground-glass attenuation within the right upper lobe (image 26; series 5). No pleural effusion or pneumothorax. No pleural effusion or pneumothorax. Upper abdomen: Hepatic steatosis. Reflux of contrast into the hepatic veins. Unchanged 3.1 cm right adrenal adenoma. Small hiatal hernia. Musculoskeletal: Interval development of a 1.5 cm lytic lesion within the T5 vertebral body. Patchy lucency and sclerosis is demonstrated throughout the visualized thoracic spine. Re- demonstrated postsurgical changes involving the chest wall anteriorly. Re- demonstrated seroma within the anterior left chest wall. Review of the MIP images confirms the above findings. IMPRESSION: Massive bilateral  pulmonary emboli beginning within the main right and main left pulmonary arteries extending throughout the  lungs bilaterally. Evidence for right heart strain with RV/LV ratio of 1.8 and reflux of contrast into the hepatic veins. The presence of right heart strain has been associated with an increased risk of morbidity and mortality. Please activate Code PE by paging [phone]. Subpleural consolidation within the right upper lobe and an adjacent focal area of ground-glass attenuation which may be secondary to radiation change, infarct from pulmonary emboli or infectious/inflammatory process. Metastatic disease not favored however not excluded. Recommend attention on followup. T5 lytic lesion compatible with osseous metastasis. Critical Value/emergent results were called by telephone at the time of interpretation on 10/22/2015 at 6:14 pm to Dr. Darl Householder, who verbally acknowledged these results. Electronically Signed   By: Lovey Newcomer M.D.   On: 10/22/2015 18:21    Time Spent in minutes  25 minutes   Kinnie Feil M.D on 10/24/2015 at 8:40 AM  Between 7am to 7pm - Pager - [phone]  After 7pm go to [email or web address] - password [email or web address]  [facility] -  Office  [phone]

## 2015-10-24 NOTE — Progress Notes (Addendum)
VASCULAR LAB PRELIMINARY  PRELIMINARY  PRELIMINARY  PRELIMINARY  Bilateral lower extremity venous duplex completed.     Right:  DVT noted in the femoral vein/pop vein junction. Evidence of superficial thrombosis in the small saphenous vein.   No evidence of DVT or superficial thrombus in the left leg- No Baker's cyst.  Exam difficult to patient habitus.  Bilaterally -peroneal veins -non visualize due to edema and depth of vessels.  Results given to patien'ts nurse -Everlene Other, RN  Janifer Adie, RVT, RDMS 10/24/2015, 10:13 AM

## 2015-10-24 NOTE — Progress Notes (Addendum)
ANTICOAGULATION CONSULT NOTE - Follow Up Consult  Pharmacy Consult for Heparin > Lovenox Indication: pulmonary embolus  Allergies  Allergen Reactions  . Amoxicillin Nausea And Vomiting  . Hydrocodone Itching  . Lisinopril Cough    Patient Measurements: Height: 5\' 9"  (175.3 cm) Weight: (!) 300 lb 14.9 oz (136.5 kg) IBW/kg (Calculated) : 66.2 Heparin Dosing Weight: 99 kg  Vital Signs: Temp: 98 F (36.7 C) (06/19 0725) Temp Source: Oral (06/19 0725) BP: 127/71 mmHg (06/19 0403) Pulse Rate: 66 (06/19 0403)  Labs:  Recent Labs  10/22/15 1307 10/22/15 2039  10/23/15 0212 10/23/15 0758 10/23/15 1222 10/23/15 2113 10/24/15 0431  HGB 11.2*  --   --  10.1*  --   --   --  10.2*  HCT 34.4*  --   --  31.5*  --   --   --  31.7*  PLT 218  --   --  199  --   --   --  203  APTT  --  114*  --   --   --   --   --   --   LABPROT  --  15.2  --   --   --   --   --   --   INR  --  1.19  --   --   --   --   --   --   HEPARINUNFRC  --   --   < > 0.71*  --  0.89* 0.60 0.60  CREATININE 0.96  --   --  0.77  --   --   --  0.70  TROPONINI  --  0.16*  --  0.15* 0.10*  --   --   --   < > = values in this interval not displayed.  Estimated Creatinine Clearance: 101.6 mL/min (by C-G formula based on Cr of 0.7).   Medications:  Heparin @ 1600 units/hr (16 ml/hr)  Assessment: 65 YOF with obesity and hx breast CA who presented to the [facility] on 6/17 with SOB and CP and persistent hypoxia. CTA showed a massive B/L PE with R-heart strain. Pharmacy consulted to start heparin for anticoagulation. Now asked to transition to lovenox. Hg low stable, plt wnl, no bleeding documented.  Goal of Therapy:  Anti-Xa level 0.6-1 units/ml 4hrs after LMWH dose given Monitor platelets by anticoagulation protocol: Yes   Plan:  D/c Heparin drip. Start Lovenox 140mg  [place] q12h 1 hour after drip d/c'd (communicated w/ RN) Monitor CBC q72h, s/sx bleeding F/u long-term AC plan  Elicia Lamp, PharmD, [facility] Clinical  Pharmacist Pager [phone] 10/24/2015 9:19 AM

## 2015-10-24 NOTE — Progress Notes (Signed)
Text page to DR [facility] with preliminary Doppler Findings

## 2015-10-25 LAB — GLUCOSE, CAPILLARY
GLUCOSE-CAPILLARY: 157 mg/dL — AB (ref 65–99)
GLUCOSE-CAPILLARY: 247 mg/dL — AB (ref 65–99)
Glucose-Capillary: 125 mg/dL — ABNORMAL HIGH (ref 65–99)
Glucose-Capillary: 151 mg/dL — ABNORMAL HIGH (ref 65–99)

## 2015-10-25 MED ORDER — IOPAMIDOL (ISOVUE-370) INJECTION 76%: 100.0000 mL | Freq: Once | INTRAVENOUS | Status: DC | PRN

## 2015-10-25 MED ORDER — IOPAMIDOL (ISOVUE-370) INJECTION 76%
100.0000 mL | Freq: Once | INTRAVENOUS | Status: AC | PRN
  Administered 2015-10-22: 100 mL via INTRAVENOUS

## 2015-10-25 NOTE — Care Management Note (Signed)
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  Per CMA:  [facility] @ [facility] # [id number]   LOVENOX 140 MG SQ Q12H  NOT ON FORMULARY  PRIOR APPROVAL -YES # [phone]   ENOXAPARIN  COVER- YES  CO=PAY- 5 % OF COST 60 PER 30 DAYS/2 SYRINGES PER DAY  TIER- 4  PRIOR APPROVAL- NO  PHARMACY: [facility]                          Expected Discharge Plan:  Home/Self Care  Discharge planning Services  CM Consult, Medication Assistance  Status of Service:  In process, will continue to follow  Girard Cooter, RN 10/25/2015, 3:17 PM

## 2015-10-25 NOTE — Care Management Important Message (Signed)
Important Message  Patient Details  Name: Tonya Alvarez MRN: [id number] Date of Birth: [birth year]   Medicare Important Message Given:  Yes    Nathen May 10/25/2015, 10:46 AM

## 2015-10-25 NOTE — Progress Notes (Signed)
PROGRESS NOTE                                                                                                                                                                                                             Patient Demographics:    Tonya Alvarez, is a 70 y.o. female, DOB - Mar 08, 1946, [id number]  Admit date - 10/22/2015   Admitting Physician Ivor Costa, MD  Outpatient Primary MD for the patient is Jacelyn Pi, MD  LOS - 3  Chief Complaint  Patient presents with  . Shortness of Breath       Brief Narrative  70 y/o female with history of tobacco use, diabetes, metastatic breast cancer followed by Dr. Lindi Adie, presents with dyspnea and chest pain, CTA chest significant for bilateral PE, admitted for further workup at anticoagulation. Echocardiogram showed right heart strain and PFO with highly mobile atrial septal aneurysm. Patient is being scheduled for TEE   Subjective:   Reports feeling better, no acute chest pains.    Assessment  & Plan :    Principal Problem:   Pulmonary embolism (HCC) Active Problems:   Breast cancer of upper-outer quadrant of right female breast ([place])   Hypertension   Bone metastases ([place])   Port catheter in place   Diabetes mellitus without complication ([place])   PE (pulmonary embolism)   Elevated troponin  1. Acute Pulmonary embolism. +Right leg DVT. CTA chest significant for massive bilaterally emboli with evidence of right heart strain. -2D echo With no evidence of right heart strain , but significant for highly mobile atrial septal aneurysm with some calcification and PFO. Consulted cardiology evaluation for TEE.  -Pt was on IV heparin, transitioned to subcutaneous Lovenox per pharmacy. Likely needs lifelong anticoagulation in the setting of active cancer. D/w Dr. Lindi Adie, who thought that patient may also be a candidate for xarelto. Will defer the further discussion for hem/onc as outpatient (has  appointment in July)  2. 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 LVEF 50-55%. Pulmonary artery peak pressure 60 mmHg -consulted cardiology for TEE, being scheduled for tomorrow. Will also need cardiology evaluation regarding PFO (depending on TEE) management in the setting of +DVT  3. Elevated troponin The setting of PE.   4. Metastatic breast cancer s/p of bilateral mastectomy, s/p of radiation therapy. Has  bone metastases. Currently on chemotherapy, Ibrance 21-day on and 7-day off; and monthly Faslodex (last dose was on 10/10/15). Pt is followed by Dr. Lindi Adie, last seen was on 10/10/15. continue current chemotherapy, and f/u with Dr. Lindi Adie  5. Hypertension hold Cozaar, on when necessary hydralazine  6. Diabetes mellitus Continue with insulin sliding-scale, continue with insulin 70/30 , 40 units twice a day  7. Acute hypoxic respiratory failure due to acute PE. Stable on nasal oxygen 4 LMP, will obtain desat study prior to discharge    Code Status : Full  Family Communication  : none at bedside  Disposition Plan  : home pend, TEE cardiology eval  Consults  : PCCM  Procedures  : none  DVT Prophylaxis  :  lovenox   Lab Results  Component Value Date   PLT 203 10/24/2015    Antibiotics  :    Anti-infectives    None        Objective:   Filed Vitals:   10/25/15 0315 10/25/15 0320 10/25/15 0411 10/25/15 0758  BP:   142/82 126/69  Pulse: 70 56 66 57  Temp:   97.3 F (36.3 C) 98.5 F (36.9 C)  TempSrc:   Oral Oral  Resp: 19 20 21 20   Height:      Weight:      SpO2: 85% 93% 97% 95%    Wt Readings from Last 3 Encounters:  10/22/15 136.5 kg (300 lb 14.9 oz)  10/10/15 137.349 kg (302 lb 12.8 oz)  09/12/15 137.939 kg (304 lb 1.6 oz)     Intake/Output Summary (Last 24 hours) at 10/25/15 0850 Last data filed at 10/24/15 1830  Gross per 24 hour  Intake    493 ml  Output    300 ml  Net    193 ml     Physical  Exam  Awake Alert, Oriented X 3, No new F.N deficits, Normal affect [place].AT,PERRAL Supple Neck,No JVD, No cervical lymphadenopathy appriciated.  Symmetrical Chest wall movement, Good air movement bilaterally, CTAB RRR,No Gallops,Rubs or new Murmurs, No Parasternal Heave +ve B.Sounds, Abd Soft, No tenderness, No organomegaly appriciated, No rebound - guarding or rigidity. No Cyanosis, Clubbing or edema, No new Rash or bruise      Data Review:    CBC  Recent Labs Lab 10/22/15 1307 10/23/15 0212 10/24/15 0431  WBC 3.5* 4.3 3.9*  HGB 11.2* 10.1* 10.2*  HCT 34.4* 31.5* 31.7*  PLT 218 199 203  MCV 88.9 88.5 90.1  MCH 28.9 28.4 29.0  MCHC 32.6 32.1 32.2  RDW 21.2* 21.1* 21.4*    Chemistries   Recent Labs Lab 10/22/15 1307 10/23/15 0212 10/24/15 0431  NA 137 138 137  K 4.6 3.9 4.0  CL 105 107 107  CO2 24 25 24   GLUCOSE 170* 97 122*  BUN 18 12 10   CREATININE 0.96 0.77 0.70  CALCIUM 9.1 8.6* 8.7*   ------------------------------------------------------------------------------------------------------------------ No results for input(s): CHOL, HDL, LDLCALC, TRIG, CHOLHDL, LDLDIRECT in the last 72 hours.  Lab Results  Component Value Date   HGBA1C * 02/09/2008    7.3 (NOTE)   The ADA recommends the following therapeutic goal for glycemic   control related to Hgb A1C measurement:   Goal of Therapy:   < 7.0% Hgb A1C   Reference: American Diabetes Association: Clinical Practice   Recommendations 2008, Diabetes Care,  2008, 31:(Suppl 1).   ------------------------------------------------------------------------------------------------------------------ No results for input(s): TSH, T4TOTAL, T3FREE, THYROIDAB in the last 72 hours.  Invalid input(s): FREET3 ------------------------------------------------------------------------------------------------------------------  No results for input(s): VITAMINB12, FOLATE, FERRITIN, TIBC, IRON, RETICCTPCT in the last 72  hours.  Coagulation profile  Recent Labs Lab 10/22/15 2039  INR 1.19    No results for input(s): DDIMER in the last 72 hours.  Cardiac Enzymes  Recent Labs Lab 10/22/15 2039 10/23/15 0212 10/23/15 0758  TROPONINI 0.16* 0.15* 0.10*   ------------------------------------------------------------------------------------------------------------------    Component Value Date/Time   BNP 119.6* 10/22/2015 2217    Inpatient Medications  Scheduled Meds: . aspirin EC  81 mg Oral QHS  . calcium-vitamin D  1 tablet Oral Daily  . enoxaparin (LOVENOX) injection  140 mg Subcutaneous Q12H  . gabapentin  300 mg Oral TID  . ibuprofen  600 mg Oral Q12H  . insulin aspart  0-9 Units Subcutaneous TID WC  . insulin aspart protamine- aspart  40 Units Subcutaneous BID WC  . multivitamin with minerals  1 tablet Oral QHS  . palbociclib  125 mg Oral Q breakfast  . simvastatin  20 mg Oral QHS  . sodium chloride flush  3 mL Intravenous Q12H   Continuous Infusions:   PRN Meds:.acetaminophen **OR** acetaminophen, dextromethorphan-guaiFENesin, fluticasone, hydrALAZINE, levalbuterol, lidocaine-prilocaine, morphine injection, ondansetron, oxyCODONE  Micro Results Recent Results (from the past 240 hour(s))  MRSA PCR Screening     Status: None   Collection Time: 10/22/15 10:14 PM  Result Value Ref Range Status   MRSA by PCR NEGATIVE NEGATIVE Final    Comment:        The GeneXpert MRSA Assay (FDA approved for NASAL specimens only), is one component of a comprehensive MRSA colonization surveillance program. It is not intended to diagnose MRSA infection nor to guide or monitor treatment for MRSA infections.     Radiology Reports Dg Chest 2 View  10/22/2015  CLINICAL DATA:  Central chest pain and shortness of breath. EXAM: CHEST  2 VIEW COMPARISON:  PET-CT 08/08/2015 FINDINGS: Right-sided injectable port terminates within the expected location of superior vena cava. Cardiomediastinal  silhouette is normal. Mediastinal contours appear intact. There is no evidence of focal airspace consolidation, pleural effusion or pneumothorax. Osseous structures are without acute abnormality. Soft tissues are grossly normal. IMPRESSION: No active cardiopulmonary disease. Electronically Signed   By: Fidela Salisbury M.D.   On: 10/22/2015 13:49   Ct Angio Chest Pe W/cm &/or Wo Cm  10/22/2015  CLINICAL DATA:  Patient with shortness of breath and chest pain. Low oxygen saturation. EXAM: CT ANGIOGRAPHY CHEST WITH CONTRAST TECHNIQUE: Multidetector CT imaging of the chest was performed using the standard protocol during bolus administration of intravenous contrast. Multiplanar CT image reconstructions and MIPs were obtained to evaluate the vascular anatomy. CONTRAST:  100 cc Isovue 370 COMPARISON:  PET-CT 08/08/2015. FINDINGS: Mediastinum/Nodes: The thyroid is heterogeneous in attenuation. Right anterior chest wall Port-A-Cath is present with tip terminating in the right atrium. No axillary, mediastinal or hilar lymphadenopathy. Heart is enlarged. Coronary arterial vascular calcifications. Main pulmonary artery is enlarged measuring 3.7 cm. Extensive bilateral pulmonary emboli are demonstrated from the level of the main right and left pulmonary arteries and extending throughout the right and left lungs bilaterally. RV/LV ratio: 1.8. Esophagus is unremarkable. Lungs/Pleura: Central airways are patent. Interval development of irregular subpleural consolidation within the right upper lobe. Additionally there is a 2 cm patchy area of ground-glass attenuation within the right upper lobe (image 26; series 5). No pleural effusion or pneumothorax. No pleural effusion or pneumothorax. Upper abdomen: Hepatic steatosis. Reflux of contrast into the hepatic veins. Unchanged 3.1 cm right adrenal  adenoma. Small hiatal hernia. Musculoskeletal: Interval development of a 1.5 cm lytic lesion within the T5 vertebral body. Patchy  lucency and sclerosis is demonstrated throughout the visualized thoracic spine. Re- demonstrated postsurgical changes involving the chest wall anteriorly. Re- demonstrated seroma within the anterior left chest wall. Review of the MIP images confirms the above findings. IMPRESSION: Massive bilateral pulmonary emboli beginning within the main right and main left pulmonary arteries extending throughout the lungs bilaterally. Evidence for right heart strain with RV/LV ratio of 1.8 and reflux of contrast into the hepatic veins. The presence of right heart strain has been associated with an increased risk of morbidity and mortality. Please activate Code PE by paging [phone]. Subpleural consolidation within the right upper lobe and an adjacent focal area of ground-glass attenuation which may be secondary to radiation change, infarct from pulmonary emboli or infectious/inflammatory process. Metastatic disease not favored however not excluded. Recommend attention on followup. T5 lytic lesion compatible with osseous metastasis. Critical Value/emergent results were called by telephone at the time of interpretation on 10/22/2015 at 6:14 pm to Dr. Darl Householder, who verbally acknowledged these results. Electronically Signed   By: Lovey Newcomer M.D.   On: 10/22/2015 18:21    Time Spent in minutes  25 minutes   Rowe Clack N M.D on 10/25/2015 at 8:50 AM  Between 7am to 7pm - Pager - [phone]  After 7pm go to [email or web address] - password [facility]  [facility] -  Office  [phone]

## 2015-10-25 NOTE — Progress Notes (Signed)
Physical Therapy Treatment Patient Details Name: Tonya Alvarez MRN: [id number] DOB: 08-18-1945 Today's Date: 10/25/2015    History of Present Illness 70 year old female with history of metastatic breast cancer followed by Dr. Lindi Adie, presents with dyspnea and chest pain, CTA chest significant for bilateral PE    PT Comments    Ms. Milke tolerated increase in ambulatory distance today, ambulating 300 ft w/ min guard assist for safety.  Pt has habit of ambulating close to wall railing but does not place weight through to steady.  SpO2 down as low as 88% x1 but pt able to recover to mid-high 90s w/ short rest break and pursed lip breathing.    Follow Up Recommendations  No PT follow up     Equipment Recommendations  None recommended by PT    Recommendations for Other Services       Precautions / Restrictions Precautions Precaution Comments: watch O2 saturations Restrictions Weight Bearing Restrictions: No    Mobility  Bed Mobility               General bed mobility comments: Pt sitting in recliner chair upon PT arrival  Transfers Overall transfer level: Needs assistance Equipment used: None Transfers: Sit to/from Stand Sit to Stand: Supervision         General transfer comment: Supervision for safety.  Ambulation/Gait Ambulation/Gait assistance: Min guard Ambulation Distance (Feet): 300 Feet Assistive device: None Gait Pattern/deviations: Step-through pattern;Decreased stride length   Gait velocity interpretation: Below normal speed for age/gender General Gait Details: Some instability noted with ambulation, pt reaching out for railing on occassion.  SpO2 down as low as 88% x1 but pt able to recover to mid-high 90s w/ short rest break and pursed lip breathing.  Pt on RA throughout session.   Stairs            Wheelchair Mobility    Modified Rankin (Stroke Patients Only)       Balance Overall balance assessment: Needs  assistance Sitting-balance support: No upper extremity supported;Feet supported Sitting balance-Leahy Scale: Good     Standing balance support: No upper extremity supported;During functional activity Standing balance-Leahy Scale: Fair                      Cognition Arousal/Alertness: Awake/alert Behavior During Therapy: WFL for tasks assessed/performed Overall Cognitive Status: Within Functional Limits for tasks assessed                      Exercises Other Exercises Other Exercises: Practiced pursed lip breathing w/ all activity, encouraged pt to continue w/ this technique throughout the day. Other Exercises: Encouraged pt to ambulate 3x/day w/ nursing staff.    General Comments General comments (skin integrity, edema, etc.): Focused session on importance of taking rest breaks when she becomes SOB.  Pt transitioning to being able to Ind decide when to take breaks.        Pertinent Vitals/Pain Pain Assessment: No/denies pain    Home Living                      Prior Function            PT Goals (current goals can now be found in the care plan section) Acute Rehab PT Goals Patient Stated Goal: to go home PT Goal Formulation: With patient Time For Goal Achievement: 11/06/15 Potential to Achieve Goals: Good Progress towards PT goals: Progressing toward goals    Frequency  Min  3X/week    PT Plan Current plan remains appropriate    Co-evaluation             End of Session Equipment Utilized During Treatment: Oxygen;Gait belt Activity Tolerance: Patient limited by fatigue Patient left: in chair;with call bell/phone within reach     Time: 1125-1143 PT Time Calculation (min) (ACUTE ONLY): 18 min  Charges:  $Gait Training: 8-22 mins                    G Codes:      Collie Siad PT, DPT  Pager: [phone] Phone: [phone] 10/25/2015, 1:28 PM

## 2015-10-26 DIAGNOSIS — Q211 Atrial septal defect: Secondary | ICD-10-CM

## 2015-10-26 DIAGNOSIS — C7951 Secondary malignant neoplasm of bone: Secondary | ICD-10-CM

## 2015-10-26 DIAGNOSIS — C50411 Malignant neoplasm of upper-outer quadrant of right female breast: Secondary | ICD-10-CM

## 2015-10-26 LAB — GLUCOSE, CAPILLARY
Glucose-Capillary: 125 mg/dL — ABNORMAL HIGH (ref 65–99)
Glucose-Capillary: 137 mg/dL — ABNORMAL HIGH (ref 65–99)

## 2015-10-26 MED ORDER — RIVAROXABAN 15 MG PO TABS: 15.0000 mg | ORAL_TABLET | Freq: Two times a day (BID) | ORAL | Status: DC

## 2015-10-26 MED ORDER — RIVAROXABAN 20 MG PO TABS: 20.0000 mg | ORAL_TABLET | Freq: Every day | ORAL | Status: DC

## 2015-10-26 MED ORDER — METFORMIN HCL ER 500 MG PO TB24: 1000.0000 mg | ORAL_TABLET | Freq: Two times a day (BID) | ORAL | Status: AC

## 2015-10-26 MED ORDER — HEPARIN SOD (PORK) LOCK FLUSH 100 UNIT/ML IV SOLN
500.0000 [IU] | INTRAVENOUS | Status: AC | PRN
  Administered 2015-10-26: 500 [IU]

## 2015-10-26 NOTE — Progress Notes (Addendum)
Dr. Burt Knack will be writing formal consult. At this time he does not feel she requires outpt cardiac f/u. Tory Mckissack PA-C

## 2015-10-26 NOTE — Progress Notes (Signed)
Physical Therapy Treatment Patient Details Name: Tonya Alvarez MRN: [id number] DOB: 06-04-1945 Today's Date: 10/26/2015    History of Present Illness 70 year old female with history of metastatic breast cancer followed by Dr. Lindi Adie, presents with dyspnea and chest pain, CTA chest significant for bilateral PE    PT Comments    Pt completed balance exercises in standing and ambulated 350 ft w/ min guard assist for safety.  Her SpO2 remained at or above 91% while ambulating.  Pt Ind determined appropriate time to take standing rest break based on her SOB.    Follow Up Recommendations  No PT follow up     Equipment Recommendations  None recommended by PT    Recommendations for Other Services       Precautions / Restrictions Precautions Precaution Comments: watch O2 saturations Restrictions Weight Bearing Restrictions: No    Mobility  Bed Mobility               General bed mobility comments: Pt sitting in recliner chair upon PT arrival  Transfers Overall transfer level: Needs assistance Equipment used: None Transfers: Sit to/from Stand Sit to Stand: Supervision         General transfer comment: Supervision for safety.  Ambulation/Gait Ambulation/Gait assistance: Min guard Ambulation Distance (Feet): 350 Feet Assistive device: None Gait Pattern/deviations: Decreased stride length;Step-through pattern   Gait velocity interpretation: Below normal speed for age/gender General Gait Details: Some instability noted with ambulation, pt reaching out for railing on occassion.  SpO2 remains at or above 91% on RA.  Pt Ind determines appropriate time to take standing rest break based on her SOB.  4 standing rest breaks.   Stairs            Wheelchair Mobility    Modified Rankin (Stroke Patients Only)       Balance Overall balance assessment: Needs assistance Sitting-balance support: Feet supported;No upper extremity supported Sitting balance-Leahy Scale:  Good     Standing balance support: No upper extremity supported;During functional activity Standing balance-Leahy Scale: Fair       Tandem Stance - Right Leg: 20 (x2) Tandem Stance - Left Leg: 20 (x2) [place] - Eyes Opened: 20 (x2) [place] - Eyes Closed: 20 (x2) High level balance activites: Head turns;Turns High Level Balance Comments: Min instability w/ head turns but pt able to steady without physical assist    Cognition Arousal/Alertness: Awake/alert Behavior During Therapy: WFL for tasks assessed/performed Overall Cognitive Status: Within Functional Limits for tasks assessed                      Exercises General Exercises - Lower Extremity Mini-Sqauts: Strengthening;10 reps;Standing;Other (comment) (w/ UEs supported on counter) Other Exercises Other Exercises: Practiced pursed lip breathing w/ all activity, encouraged pt to continue w/ this technique throughout the day. Other Exercises: Encouraged pt to ambulate 3x/day w/ nursing staff.    General Comments        Pertinent Vitals/Pain Pain Assessment: Faces Faces Pain Scale: Hurts little more Pain Location: Bil knees w/ balance exercises  Pain Descriptors / Indicators: Aching Pain Intervention(s): Limited activity within patient's tolerance;Monitored during session    Home Living                      Prior Function            PT Goals (current goals can now be found in the care plan section) Acute Rehab PT Goals Patient Stated Goal: to go home  PT Goal Formulation: With patient Time For Goal Achievement: 11/06/15 Potential to Achieve Goals: Good Progress towards PT goals: Progressing toward goals    Frequency  Min 3X/week    PT Plan Current plan remains appropriate    Co-evaluation             End of Session Equipment Utilized During Treatment: Oxygen;Gait belt Activity Tolerance: Patient limited by fatigue Patient left: in chair;with call bell/phone within reach      Time: 1436-1459 PT Time Calculation (min) (ACUTE ONLY): 23 min  Charges:  $Gait Training: 23-37 mins                    G Codes:       Collie Siad PT, DPT  Pager: [phone] Phone: [phone] 10/26/2015, 3:24 PM

## 2015-10-26 NOTE — Discharge Summary (Signed)
Physician Discharge Summary  Tonya Alvarez [id number] DOB: [birth year] DOA: 10/22/2015  PCP: Jacelyn Pi, MD  Admit date: 10/22/2015 Discharge date: 10/26/2015  Admitted From: home Disposition:  HOme  Recommendations for Outpatient Follow-up:  1. Follow up with PCP in 1-2 weeks 2. Please obtain BMP/CBC in one week 3. Please follow up Dr Lindi Adie next week.      Discharge Condition:stable.  CODE STATUS:full  Diet recommendation: Heart Healthy / Carb Modified / Brief/Interim Summary: 70 y/o female with history of tobacco use, diabetes, metastatic breast cancer followed by Dr. Lindi Adie, presents with dyspnea and chest pain, CTA chest significant for bilateral PE, admitted for further workup at anticoagulation. Echocardiogram showed right heart strain and PFO with highly mobile atrial septal aneurysm. She was initially scheduled for a TEE, but cardiology suggested to continue with long tern anticoagulation, no indication for PFO closure at this time.   Discharge Diagnoses:  Principal Problem:   Pulmonary embolism (HCC) Active Problems:   Breast cancer of upper-outer quadrant of right female breast ([place])   Hypertension   Bone metastases ([place])   Port catheter in place   Diabetes mellitus without complication ([place])   PE (pulmonary embolism)   Elevated troponin  Metastatic breast cancer: Recommend outpatient follow up with Dr Lindi Adie .    Bilateral PE and right leg DVT: Initially started on Lovenox injections, changed to xarelto on discharge after discussing with oncology Dr Lindi Adie.  Recommend outpatient follow up with Dr Lindi Adie to be monitored on long tern anticoagulation.    Elevated troponin: probably from PE.    HYPERTENSION: controlled.   Diabetes mellitus: Resume home meds.   Acute hypoxic respiratory  Failure: secondary to PE.  Pt on RA with good oxygen sats at rest and on ambulation.    Discharge Instructions  Discharge Instructions    Diet - low sodium heart  healthy    Complete by:  As directed      Discharge instructions    Complete by:  As directed   Follow up with Dr Lindi Adie in one week.  Please take Xarelto 15 mg bid for 21 days followed by Xarelto 20 mg daily after that.            Medication List    STOP taking these medications        anastrozole 1 MG tablet  Commonly known as:  ARIMIDEX      TAKE these medications        aspirin EC 81 MG tablet  Take 81 mg by mouth at bedtime.     calcium-vitamin D 500-200 MG-UNIT tablet  Commonly known as:  OSCAL WITH D  Take 1 tablet by mouth daily.     clindamycin 300 MG capsule  Commonly known as:  CLEOCIN  Take 900 mg by mouth See admin instructions. Take 3 capsules (900 mg) by mouth one hour prior to dental appointment - last appointment October 11, 2015     fluticasone 50 MCG/ACT nasal spray  Commonly known as:  FLONASE  Place 1 spray into both nostrils daily as needed for allergies or rhinitis.     gabapentin 300 MG capsule  Commonly known as:  NEURONTIN  Take 300 mg by mouth 3 (three) times daily.     IBRANCE 125 MG capsule  Generic drug:  palbociclib  TAKE 1 CAPSULE BY MOUTH ONCE DAILY WITH BREAKFAST. TAKE WHOLE WITH FOOD FOR 21 DAYS THEN 7 DAYS OFF     ibuprofen 600 MG tablet  Commonly known  as:  ADVIL,MOTRIN  Take 600 mg by mouth every 12 (twelve) hours.     insulin lispro protamine-lispro (75-25) 100 UNIT/ML Susp injection  Commonly known as:  HUMALOG 75/25 MIX  Inject 10-60 Units into the skin See admin instructions. Inject 60 units subcutaneously with breakfast and supper, inject 10 mls after lunch if CBG >150     lidocaine-prilocaine cream  Commonly known as:  EMLA  Apply 1 application topically as needed.     losartan 50 MG tablet  Commonly known as:  COZAAR  Take 50 mg by mouth daily.     metFORMIN 500 MG 24 hr tablet  Commonly known as:  GLUCOPHAGE-XR  Take 2 tablets (1,000 mg total) by mouth 2 (two) times daily.  Start taking on:  10/31/2015      multivitamin with minerals Tabs tablet  Take 1 tablet by mouth at bedtime.     Rivaroxaban 15 MG Tabs tablet  Commonly known as:  XARELTO  Take 1 tablet (15 mg total) by mouth 2 (two) times daily with a meal.     rivaroxaban 20 MG Tabs tablet  Commonly known as:  XARELTO  Take 1 tablet (20 mg total) by mouth daily with supper.  Start taking on:  11/17/2015     simvastatin 20 MG tablet  Commonly known as:  ZOCOR  Take 20 mg by mouth at bedtime.           Follow-up Information    Follow up with Jacelyn Pi, MD. Schedule an appointment as soon as possible for a visit in 1 week.   Specialty:  Endocrinology   Contact information:   [place] [address] [phone]      Allergies  Allergen Reactions  . Amoxicillin Nausea And Vomiting  . Hydrocodone Itching  . Lisinopril Cough    Consultations:   cardiology  Procedures/Studies: Dg Chest 2 View  10/22/2015  CLINICAL DATA:  Central chest pain and shortness of breath. EXAM: CHEST  2 VIEW COMPARISON:  PET-CT 08/08/2015 FINDINGS: Right-sided injectable port terminates within the expected location of superior vena cava. Cardiomediastinal silhouette is normal. Mediastinal contours appear intact. There is no evidence of focal airspace consolidation, pleural effusion or pneumothorax. Osseous structures are without acute abnormality. Soft tissues are grossly normal. IMPRESSION: No active cardiopulmonary disease. Electronically Signed   By: Fidela Salisbury M.D.   On: 10/22/2015 13:49   Ct Angio Chest Pe W/cm &/or Wo Cm  10/22/2015  CLINICAL DATA:  Patient with shortness of breath and chest pain. Low oxygen saturation. EXAM: CT ANGIOGRAPHY CHEST WITH CONTRAST TECHNIQUE: Multidetector CT imaging of the chest was performed using the standard protocol during bolus administration of intravenous contrast. Multiplanar CT image reconstructions and MIPs were obtained to evaluate the vascular anatomy.  CONTRAST:  100 cc Isovue 370 COMPARISON:  PET-CT 08/08/2015. FINDINGS: Mediastinum/Nodes: The thyroid is heterogeneous in attenuation. Right anterior chest wall Port-A-Cath is present with tip terminating in the right atrium. No axillary, mediastinal or hilar lymphadenopathy. Heart is enlarged. Coronary arterial vascular calcifications. Main pulmonary artery is enlarged measuring 3.7 cm. Extensive bilateral pulmonary emboli are demonstrated from the level of the main right and left pulmonary arteries and extending throughout the right and left lungs bilaterally. RV/LV ratio: 1.8. Esophagus is unremarkable. Lungs/Pleura: Central airways are patent. Interval development of irregular subpleural consolidation within the right upper lobe. Additionally there is a 2 cm patchy area of ground-glass attenuation within the right upper lobe (image 26; series 5). No  pleural effusion or pneumothorax. No pleural effusion or pneumothorax. Upper abdomen: Hepatic steatosis. Reflux of contrast into the hepatic veins. Unchanged 3.1 cm right adrenal adenoma. Small hiatal hernia. Musculoskeletal: Interval development of a 1.5 cm lytic lesion within the T5 vertebral body. Patchy lucency and sclerosis is demonstrated throughout the visualized thoracic spine. Re- demonstrated postsurgical changes involving the chest wall anteriorly. Re- demonstrated seroma within the anterior left chest wall. Review of the MIP images confirms the above findings. IMPRESSION: Massive bilateral pulmonary emboli beginning within the main right and main left pulmonary arteries extending throughout the lungs bilaterally. Evidence for right heart strain with RV/LV ratio of 1.8 and reflux of contrast into the hepatic veins. The presence of right heart strain has been associated with an increased risk of morbidity and mortality. Please activate Code PE by paging [phone]. Subpleural consolidation within the right upper lobe and an adjacent focal area of  ground-glass attenuation which may be secondary to radiation change, infarct from pulmonary emboli or infectious/inflammatory process. Metastatic disease not favored however not excluded. Recommend attention on followup. T5 lytic lesion compatible with osseous metastasis. Critical Value/emergent results were called by telephone at the time of interpretation on 10/22/2015 at 6:14 pm to Dr. Darl Householder, who verbally acknowledged these results. Electronically Signed   By: Lovey Newcomer M.D.   On: 10/22/2015 18:21       Subjective: No new complaints.   Discharge Exam: Filed Vitals:   10/25/15 2300 10/26/15 0754  BP:  134/63  Pulse:  57  Temp: 98.3 F (36.8 C) 97.3 F (36.3 C)  Resp:  14   Filed Vitals:   10/25/15 1716 10/25/15 1934 10/25/15 2300 10/26/15 0754  BP: 120/57 95/53 101/52 134/63  Pulse: 55 60 97 57  Temp: 97.6 F (36.4 C) 97.9 F (36.6 C) 98.3 F (36.8 C) 97.3 F (36.3 C)  TempSrc: Oral Oral Oral Oral  Resp: 12 13 20 14   Height:      Weight:      SpO2: 96% 95% 94% 95%    General: Pt is alert, awake, not in acute distress Cardiovascular: RRR, S1/S2 +, no rubs, no gallops Respiratory: CTA bilaterally, no wheezing, no rhonchi Abdominal: Soft, NT, ND, bowel sounds + Extremities: no edema, no cyanosis    The results of significant diagnostics from this hospitalization (including imaging, microbiology, ancillary and laboratory) are listed below for reference.     Microbiology: Recent Results (from the past 240 hour(s))  MRSA PCR Screening     Status: None   Collection Time: 10/22/15 10:14 PM  Result Value Ref Range Status   MRSA by PCR NEGATIVE NEGATIVE Final    Comment:        The GeneXpert MRSA Assay (FDA approved for NASAL specimens only), is one component of a comprehensive MRSA colonization surveillance program. It is not intended to diagnose MRSA infection nor to guide or monitor treatment for MRSA infections.      Labs: BNP (last 3 results)  Recent  Labs  10/22/15 2217  BNP 123456*   Basic Metabolic Panel:  Recent Labs Lab 10/22/15 1307 10/23/15 0212 10/24/15 0431  NA 137 138 137  K 4.6 3.9 4.0  CL 105 107 107  CO2 24 25 24   GLUCOSE 170* 97 122*  BUN 18 12 10   CREATININE 0.96 0.77 0.70  CALCIUM 9.1 8.6* 8.7*   Liver Function Tests: No results for input(s): AST, ALT, ALKPHOS, BILITOT, PROT, ALBUMIN in the last 168 hours. No results for input(s): LIPASE,  AMYLASE in the last 168 hours. No results for input(s): AMMONIA in the last 168 hours. CBC:  Recent Labs Lab 10/22/15 1307 10/23/15 0212 10/24/15 0431  WBC 3.5* 4.3 3.9*  HGB 11.2* 10.1* 10.2*  HCT 34.4* 31.5* 31.7*  MCV 88.9 88.5 90.1  PLT 218 199 203   Cardiac Enzymes:  Recent Labs Lab 10/22/15 2039 10/23/15 0212 10/23/15 0758  TROPONINI 0.16* 0.15* 0.10*   BNP: Invalid input(s): POCBNP CBG:  Recent Labs Lab 10/25/15 1234 10/25/15 1622 10/25/15 2144 10/26/15 0754 10/26/15 1253  GLUCAP 157* 247* 151* 125* 137*   D-Dimer No results for input(s): DDIMER in the last 72 hours. Hgb A1c No results for input(s): HGBA1C in the last 72 hours. Lipid Profile No results for input(s): CHOL, HDL, LDLCALC, TRIG, CHOLHDL, LDLDIRECT in the last 72 hours. Thyroid function studies No results for input(s): TSH, T4TOTAL, T3FREE, THYROIDAB in the last 72 hours.  Invalid input(s): FREET3 Anemia work up No results for input(s): VITAMINB12, FOLATE, FERRITIN, TIBC, IRON, RETICCTPCT in the last 72 hours. Urinalysis    Component Value Date/Time   COLORURINE YELLOW 08/01/2010 2050   APPEARANCEUR CLOUDY* 08/01/2010 2050   LABSPEC 1.018 08/01/2010 2050   PHURINE 5.0 08/01/2010 2050   GLUCOSEU NEGATIVE 08/01/2010 2050   HGBUR NEGATIVE 08/01/2010 2050   BILIRUBINUR NEGATIVE 08/01/2010 2050   KETONESUR NEGATIVE 08/01/2010 2050   PROTEINUR 100* 08/01/2010 2050   UROBILINOGEN 0.2 08/01/2010 2050   NITRITE NEGATIVE 08/01/2010 2050   LEUKOCYTESUR NEGATIVE 08/01/2010  2050   Sepsis Labs Invalid input(s): PROCALCITONIN,  WBC,  LACTICIDVEN Microbiology Recent Results (from the past 240 hour(s))  MRSA PCR Screening     Status: None   Collection Time: 10/22/15 10:14 PM  Result Value Ref Range Status   MRSA by PCR NEGATIVE NEGATIVE Final    Comment:        The GeneXpert MRSA Assay (FDA approved for NASAL specimens only), is one component of a comprehensive MRSA colonization surveillance program. It is not intended to diagnose MRSA infection nor to guide or monitor treatment for MRSA infections.      Time coordinating discharge: Over 30 minutes  SIGNED:   Hosie Poisson, MD  [facility] 10/26/2015, 3:21 PM Pager [phone]  If 7PM-7AM, please contact night-coverage [email or web address] Password [email or web address]

## 2015-10-26 NOTE — Care Management Note (Signed)
Case Management Note  Patient Details  Name: Tonya Alvarez MRN: [id number] Date of Birth: 10-29-1945  Subjective/Objective:    Per attending, after discussion with Dr Lindi Adie, anticoagulant has been changed to Xarelto.  Provided card for 30-day free trial offer and attending will include script for 90-day supply for [employer].                      Expected Discharge Plan:  Home/Self Care  Discharge planning Services  CM Consult, Medication Assistance  Status of Service:  Completed, signed off  Girard Cooter, [place] 10/26/2015, 3:06 PM

## 2015-10-26 NOTE — Progress Notes (Signed)
ANTICOAGULATION CONSULT NOTE - Follow Up Consult  Pharmacy Consult for Lovenox>Xarelto Indication: pulmonary embolus  Allergies  Allergen Reactions  . Amoxicillin Nausea And Vomiting  . Hydrocodone Itching  . Lisinopril Cough    Patient Measurements: Height: 5\' 9"  (175.3 cm) Weight: (!) 300 lb 14.9 oz (136.5 kg) IBW/kg (Calculated) : 66.2 Heparin Dosing Weight: 99 kg  Vital Signs: Temp: 97.3 F (36.3 C) (06/21 0754) Temp Source: Oral (06/21 0754) BP: 134/63 mmHg (06/21 0754) Pulse Rate: 57 (06/21 0754)  Labs:  Recent Labs  10/23/15 2113 10/24/15 0431  HGB  --  10.2*  HCT  --  31.7*  PLT  --  203  HEPARINUNFRC 0.60 0.60  CREATININE  --  0.70    Estimated Creatinine Clearance: 101.6 mL/min (by C-G formula based on Cr of 0.7).   Assessment: 13 YOF with obesity and hx breast CA who presented to the [facility] on 6/17 with SOB and CP and persistent hypoxia. CTA showed a massive B/L PE with R-heart strain. Pharmacy consulted to start heparin for anticoagulation. Now asked to transition to lovenox. Hg low stable, plt wnl, no bleeding documented. Will start Xarelto when next dose of lovenox due tonight.  Goal of Therapy:  DVT/PE tx/prevention Monitor platelets by anticoagulation protocol: Yes   Plan:  Lovenox >> Xarelto 15mg  bid x 21 days to start tonight at 2130; then 20mg  daily Monitor CBC, s/sx bleeding Recommend d/c scheduled NSAID on [place], PharmD, [facility] Clinical Pharmacist Pager [phone] 10/26/2015 2:51 PM

## 2015-10-26 NOTE — Discharge Instructions (Signed)
Information on my medicine - XARELTO (rivaroxaban)  This medication education was reviewed with me or my healthcare representative as part of my discharge preparation.  The pharmacist that spoke with me during my hospital stay was:  Romona Curls, [place]? Xarelto was prescribed to treat blood clots that may have been found in the veins of your legs (deep vein thrombosis) or in your lungs (pulmonary embolism) and to reduce the risk of them occurring again.  What do you need to know about Xarelto? The starting dose is one 15 mg tablet taken TWICE daily with food for the FIRST 21 DAYS then on (enter date)  11/17/2015  the dose is changed to one 20 mg tablet taken ONCE A DAY with your evening meal.  DO NOT stop taking Xarelto without talking to the health care provider who prescribed the medication.  Refill your prescription for 20 mg tablets before you run out.  After discharge, you should have regular check-up appointments with your healthcare provider that is prescribing your Xarelto.  In the future your dose may need to be changed if your kidney function changes by a significant amount.  What do you do if you miss a dose? If you are taking Xarelto TWICE DAILY and you miss a dose, take it as soon as you remember. You may take two 15 mg tablets (total 30 mg) at the same time then resume your regularly scheduled 15 mg twice daily the next day.  If you are taking Xarelto ONCE DAILY and you miss a dose, take it as soon as you remember on the same day then continue your regularly scheduled once daily regimen the next day. Do not take two doses of Xarelto at the same time.   Important Safety Information Xarelto is a blood thinner medicine that can cause bleeding. You should call your healthcare provider right away if you experience any of the following: ? Bleeding from an injury or your nose that does not stop. ? Unusual colored urine (red or dark brown) or  unusual colored stools (red or black). ? Unusual bruising for unknown reasons. ? A serious fall or if you hit your head (even if there is no bleeding).  Some medicines may interact with Xarelto and might increase your risk of bleeding while on Xarelto. To help avoid this, consult your healthcare provider or pharmacist prior to using any new prescription or non-prescription medications, including herbals, vitamins, non-steroidal anti-inflammatory drugs (NSAIDs) and supplements.  This website has more information on Xarelto: [email or web address]

## 2015-10-26 NOTE — Consult Note (Signed)
CARDIOLOGY CONSULT NOTE  Patient ID: Tonya Alvarez, MRN: [id number], DOB/AGE: [birth year] 70 y.o. Admit date: 10/22/2015 Date of Consult: 10/26/2015  Primary Physician: Jacelyn Pi, MD Primary Cardiologist: none Referring Physician: Dr Daleen Bo  Chief Complaint: shortness of breath/chest pain Reason for Consultation: PFO  HPI: 70 year old woman with metastatic breast cancer who presented with acute chest pain and shortness of breath. She was found to have bilateral pulmonary emboli and DVT in the right leg. She is treated with heparin and then transitioned to Lovenox. The patient is feeling much better at this time. 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 Cardiology consult is requested to evaluate her PFO and potential need for any treatment.  The patient has no history of cardiac disease. She has been diagnosed with breast cancer and underwent bilateral mastectomy, chemotherapy, and radiation therapy. 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 She was hospitalized June 17 after presenting with substernal chest discomfort. She was noted to have hypoxemia and required oxygen. Imaging studies demonstrated massive bilateral pulmonary emboli with evidence of right heart strain.  Medical History:  Past Medical History  Diagnosis Date  . Heart murmur     mitral valve  . Complication of anesthesia     O2 SAT  DROP   . Hypertension   . Neuromuscular disorder ([place])   . Type II diabetes mellitus ([place])   . Mitral regurgitation   . Hyperlipidemia   . Diabetic peripheral neuropathy ([place])   . Sinus headache     "seasonal"  . Arthritis     "knees, ankles" (09/22/2014  . DDD (degenerative disc disease), cervical   . DDD (degenerative disc disease), thoracolumbar   . DDD (degenerative disc disease), lumbosacral   . Chronic lower back pain   . Cancer of right breast ([place])   . Breast cancer ([place]) 02/04/14    right breast  . Diabetes mellitus without  complication [facility])       Surgical History:  Past Surgical History  Procedure Laterality Date  . Portacath placement Right 02/23/2014    power port, tip cavo-atrial junction  . Mastectomy complete / simple Left 09/22/2014  . Mastectomy modified radical Right 09/22/2014  . Tonsillectomy  1955  . Total knee arthroplasty Left ~ 2009  . Joint replacement    . Breast biopsy Right 02/2014  . Tubal ligation  1980  . Mastectomy modified radical Right 09/22/2014    Procedure:  RIGHT MASTECTOMY MODIFIED RADICAL;  Surgeon: Coralie Keens, MD;  Location: [place];  Service: General;  Laterality: Right;  . Simple mastectomy with axillary sentinel node biopsy Left 09/22/2014    Procedure: LEFT SIMPLE MASTECTOMY;  Surgeon: Coralie Keens, MD;  Location: [place];  Service: General;  Laterality: Left;     Home Meds: Prior to Admission medications   Medication Sig Start Date End Date Taking? Authorizing Provider  aspirin EC 81 MG tablet Take 81 mg by mouth at bedtime.   Yes Historical Provider, MD  calcium-vitamin D (OSCAL WITH D) 500-200 MG-UNIT per tablet Take 1 tablet by mouth daily.   Yes Historical Provider, MD  clindamycin (CLEOCIN) 300 MG capsule Take 900 mg by mouth See admin instructions. Take 3 capsules (900 mg) by mouth one hour prior to dental appointment - last appointment October 11, 2015   Yes Historical Provider, MD  fluticasone (FLONASE) 50 MCG/ACT nasal spray Place 1 spray into both nostrils daily as needed for allergies or rhinitis. Patient taking differently: Place 1 spray into  both nostrils at bedtime as needed for allergies or rhinitis.  07/26/14  Yes Nicholas Lose, MD  gabapentin (NEURONTIN) 300 MG capsule Take 300 mg by mouth 3 (three) times daily.    Yes Historical Provider, MD  IBRANCE 125 MG capsule TAKE 1 CAPSULE BY MOUTH ONCE DAILY WITH BREAKFAST. TAKE WHOLE WITH FOOD FOR 21 DAYS THEN 7 DAYS OFF 10/11/15  Yes Nicholas Lose, MD  ibuprofen (ADVIL,MOTRIN) 600 MG tablet Take 600 mg by mouth  every 12 (twelve) hours.   Yes Historical Provider, MD  insulin lispro protamine-lispro (HUMALOG 75/25 MIX) (75-25) 100 UNIT/ML SUSP injection Inject 10-60 Units into the skin See admin instructions. Inject 60 units subcutaneously with breakfast and supper, inject 10 mls after lunch if CBG >150   Yes Historical Provider, MD  lidocaine-prilocaine (EMLA) cream Apply 1 application topically as needed. Patient taking differently: Apply 1 application topically as needed (prior to flushing of port (every 4-6 weeks)).  04/09/14  Yes Laurie Panda, NP  losartan (COZAAR) 50 MG tablet Take 50 mg by mouth daily.    Yes Historical Provider, MD  metFORMIN (GLUCOPHAGE-XR) 500 MG 24 hr tablet Take 1,000 mg by mouth 2 (two) times daily. 09/05/15  Yes Historical Provider, MD  Multiple Vitamin (MULTIVITAMIN WITH MINERALS) TABS tablet Take 1 tablet by mouth at bedtime.   Yes Historical Provider, MD  simvastatin (ZOCOR) 20 MG tablet Take 20 mg by mouth at bedtime. 09/28/15  Yes Historical Provider, MD  anastrozole (ARIMIDEX) 1 MG tablet Take 1 tablet by mouth  daily Patient not taking: Reported on 10/22/2015 05/24/15   Nicholas Lose, MD    Inpatient Medications:  . aspirin EC  81 mg Oral QHS  . calcium-vitamin D  1 tablet Oral Daily  . enoxaparin (LOVENOX) injection  140 mg Subcutaneous Q12H  . gabapentin  300 mg Oral TID  . ibuprofen  600 mg Oral Q12H  . insulin aspart  0-9 Units Subcutaneous TID WC  . insulin aspart protamine- aspart  40 Units Subcutaneous BID WC  . multivitamin with minerals  1 tablet Oral QHS  . palbociclib  125 mg Oral Q breakfast  . simvastatin  20 mg Oral QHS  . sodium chloride flush  3 mL Intravenous Q12H      Allergies:  Allergies  Allergen Reactions  . Amoxicillin Nausea And Vomiting  . Hydrocodone Itching  . Lisinopril Cough    Social History   Social History  . Marital Status: Married    Spouse Name: N/A  . Number of Children: N/A  . Years of Education: N/A    Occupational History  . Not on file.   Social History Main Topics  . Smoking status: Former Smoker -- 0.75 packs/day for 30 years    Types: Cigarettes    Quit date: 05/07/2013  . Smokeless tobacco: Never Used  . Alcohol Use: No     Comment: 09/22/2014 "I'll drink a few times/yr"  . Drug Use: No  . Sexual Activity: Yes   Other Topics Concern  . Not on file   Social History Narrative     Family History  Problem Relation Age of Onset  . Cancer Father     lung cancer  . Cancer Maternal Aunt     Kidney  . Hypertension Maternal Uncle      Review of Systems: General: negative for chills, fever, night sweats or weight changes.  ENT: negative for rhinorrhea or epistaxis Cardiovascular: see HPIDermatological: negative for rash Respiratory: negative for cough  or wheezing, positive for shortness of breath GI: negative for nausea, vomiting, diarrhea, bright red blood per rectum, melena, or hematemesis GU: no hematuria, urgency, or frequency Neurologic: negative for visual changes, syncope, headache, or dizziness Heme: no easy bruising or bleeding Endo: negative for excessive thirst, thyroid disorder, or flushing Musculoskeletal: negative for joint pain or swelling, negative for myalgias  All other systems reviewed and are otherwise negative except as noted above.  Physical Exam: Blood pressure 134/63, pulse 57, temperature 97.3 F (36.3 C), temperature source Oral, resp. rate 14, height 5\' 9"  (1.753 m), weight 300 lb 14.9 oz (136.5 kg), SpO2 95 %. Pt is alert and oriented, pleasant obese woman in no distress. HEENT: normal Neck: JVP normal. Carotid upstrokes normal without bruits. No thyromegaly. Lungs: equal expansion, clear bilaterally CV: Apex is discrete and nondisplaced, RRR grade 2/6 systolic ejection murmur at the right upper sternal border Abd: soft, NT, +BS, no bruit, no hepatosplenomegaly Back: no CVA tenderness Ext: 1+ right pretibial edema, left side with trace  pretibial edema        DP/PT pulses intact and = Skin: warm and dry without rash Neuro: CNII-XII intact             Strength intact = bilaterally    Labs: No results for input(s): CKTOTAL, CKMB, TROPONINI in the last 72 hours. Lab Results  Component Value Date   WBC 3.9* 10/24/2015   HGB 10.2* 10/24/2015   HCT 31.7* 10/24/2015   MCV 90.1 10/24/2015   PLT 203 10/24/2015    Recent Labs Lab 10/24/15 0431  NA 137  K 4.0  CL 107  CO2 24  BUN 10  CREATININE 0.70  CALCIUM 8.7*  GLUCOSE 122*   No results found for: CHOL, HDL, LDLCALC, TRIG No results found for: DDIMER  Radiology/Studies:  Dg Chest 2 View  10/22/2015  CLINICAL DATA:  Central chest pain and shortness of breath. EXAM: CHEST  2 VIEW COMPARISON:  PET-CT 08/08/2015 FINDINGS: Right-sided injectable port terminates within the expected location of superior vena cava. Cardiomediastinal silhouette is normal. Mediastinal contours appear intact. There is no evidence of focal airspace consolidation, pleural effusion or pneumothorax. Osseous structures are without acute abnormality. Soft tissues are grossly normal. IMPRESSION: No active cardiopulmonary disease. Electronically Signed   By: Fidela Salisbury M.D.   On: 10/22/2015 13:49   Ct Angio Chest Pe W/cm &/or Wo Cm  10/22/2015  CLINICAL DATA:  Patient with shortness of breath and chest pain. Low oxygen saturation. EXAM: CT ANGIOGRAPHY CHEST WITH CONTRAST TECHNIQUE: Multidetector CT imaging of the chest was performed using the standard protocol during bolus administration of intravenous contrast. Multiplanar CT image reconstructions and MIPs were obtained to evaluate the vascular anatomy. CONTRAST:  100 cc Isovue 370 COMPARISON:  PET-CT 08/08/2015. FINDINGS: Mediastinum/Nodes: The thyroid is heterogeneous in attenuation. Right anterior chest wall Port-A-Cath is present with tip terminating in the right atrium. No axillary, mediastinal or hilar lymphadenopathy. Heart is enlarged.  Coronary arterial vascular calcifications. Main pulmonary artery is enlarged measuring 3.7 cm. Extensive bilateral pulmonary emboli are demonstrated from the level of the main right and left pulmonary arteries and extending throughout the right and left lungs bilaterally. RV/LV ratio: 1.8. Esophagus is unremarkable. Lungs/Pleura: Central airways are patent. Interval development of irregular subpleural consolidation within the right upper lobe. Additionally there is a 2 cm patchy area of ground-glass attenuation within the right upper lobe (image 26; series 5). No pleural effusion or pneumothorax. No pleural effusion or pneumothorax. Upper abdomen:  Hepatic steatosis. Reflux of contrast into the hepatic veins. Unchanged 3.1 cm right adrenal adenoma. Small hiatal hernia. Musculoskeletal: Interval development of a 1.5 cm lytic lesion within the T5 vertebral body. Patchy lucency and sclerosis is demonstrated throughout the visualized thoracic spine. Re- demonstrated postsurgical changes involving the chest wall anteriorly. Re- demonstrated seroma within the anterior left chest wall. Review of the MIP images confirms the above findings. IMPRESSION: Massive bilateral pulmonary emboli beginning within the main right and main left pulmonary arteries extending throughout the lungs bilaterally. Evidence for right heart strain with RV/LV ratio of 1.8 and reflux of contrast into the hepatic veins. The presence of right heart strain has been associated with an increased risk of morbidity and mortality. Please activate Code PE by paging [phone]. Subpleural consolidation within the right upper lobe and an adjacent focal area of ground-glass attenuation which may be secondary to radiation change, infarct from pulmonary emboli or infectious/inflammatory process. Metastatic disease not favored however not excluded. Recommend attention on followup. T5 lytic lesion compatible with osseous metastasis. Critical Value/emergent results  were called by telephone at the time of interpretation on 10/22/2015 at 6:14 pm to Dr. Darl Householder, who verbally acknowledged these results. Electronically Signed   By: Lovey Newcomer M.D.   On: 10/22/2015 18:21    Cardiac Studies: 2-D echocardiogram 10/23/2015: Study Conclusions  - Left ventricle: Abnormal septal motion The cavity size was  normal. Wall thickness was increased in a pattern of mild LVH.  Systolic function was normal. The estimated ejection fraction was  in the range of 50% to 55%. Wall motion was normal; there were no  regional wall motion abnormalities. Left ventricular diastolic  function parameters were normal. - Atrial septum: Highly mobile atrial septal aneurysm with some  calcfication and PFO  Consider f/u TEE - Pulmonary arteries: PA peak pressure: 60 mm Hg (S).   ASSESSMENT AND PLAN:  70 year old woman with metastatic breast cancer who presented with DVT and massive pumonary embolism. She was incidentally noted to have a PFO on 2-D echocardiogram. The patient has no history of stroke or TIA. She has no history of cardiac disease.  I reviewed the finding of PFO with atrial septal aneurysm with this patient. I have personally reviewed her echo study which demonstrates atrial septal aneurysm and bowing of the interatrial septum from right to left. It is likely that acute PE and elevated right heart pressures caused her PFO to 'prop open' and transiently shunt right-to-left. She understands that PFO is a very common finding in the general population. She understands that there is consideration of transcatheter closure in patients with who have this finding who present with cryptogenic stroke. Rarely we consider PFO closure after a paradoxical embolism when the patient is diagnosed with DVT and/or PE who also has an arterial thrombotic event. This is generally done in younger patients in order to avoid lifelong anticoagulation. In this situation, the patient has never had an  arterial embolic event. She has an indication for long-term anticoagulation because of DVT/PE with a background of breast cancer with metastases. I think the most appropriate treatment for her is long-term anticoagulation. I do not think there is any indication for PFO closure at this time. I would be happy to see her if any other problems arise down the road. Please call if any questions, thanks.  SignedSherren Mocha MD, [facility] 10/26/2015, 1:45 PM

## 2015-10-27 ENCOUNTER — Other Ambulatory Visit: Payer: Self-pay

## 2015-10-27 ENCOUNTER — Telehealth: Payer: Self-pay | Admitting: Hematology and Oncology

## 2015-10-27 NOTE — Telephone Encounter (Signed)
[facility] to [facility] f/u-sent to HIM to [facility] and call pt with appt w/[facility] next week

## 2015-11-01 ENCOUNTER — Encounter: Payer: Self-pay | Admitting: Hematology

## 2015-11-01 NOTE — Progress Notes (Signed)
prior auth form from [employer]-faxed [id number]

## 2015-11-07 ENCOUNTER — Encounter (HOSPITAL_COMMUNITY): Payer: Self-pay

## 2015-11-07 ENCOUNTER — Ambulatory Visit (HOSPITAL_COMMUNITY)
Admission: RE | Admit: 2015-11-07 | Discharge: 2015-11-07 | Disposition: A | Discharge status: Home / Self Care | Payer: Medicare Other | Source: Ambulatory Visit | Attending: Hematology and Oncology | Admitting: Hematology and Oncology

## 2015-11-07 DIAGNOSIS — C50411 Malignant neoplasm of upper-outer quadrant of right female breast: Secondary | ICD-10-CM | POA: Diagnosis not present

## 2015-11-07 DIAGNOSIS — D3501 Benign neoplasm of right adrenal gland: Secondary | ICD-10-CM | POA: Diagnosis not present

## 2015-11-07 DIAGNOSIS — I700 Atherosclerosis of aorta: Secondary | ICD-10-CM | POA: Diagnosis not present

## 2015-11-07 DIAGNOSIS — C50911 Malignant neoplasm of unspecified site of right female breast: Secondary | ICD-10-CM | POA: Diagnosis not present

## 2015-11-07 DIAGNOSIS — C7951 Secondary malignant neoplasm of bone: Secondary | ICD-10-CM | POA: Diagnosis not present

## 2015-11-07 DIAGNOSIS — R918 Other nonspecific abnormal finding of lung field: Secondary | ICD-10-CM | POA: Insufficient documentation

## 2015-11-07 DIAGNOSIS — I2699 Other pulmonary embolism without acute cor pulmonale: Secondary | ICD-10-CM | POA: Diagnosis not present

## 2015-11-07 DIAGNOSIS — C419 Malignant neoplasm of bone and articular cartilage, unspecified: Secondary | ICD-10-CM | POA: Diagnosis not present

## 2015-11-07 DIAGNOSIS — E041 Nontoxic single thyroid nodule: Secondary | ICD-10-CM | POA: Diagnosis not present

## 2015-11-07 MED ORDER — IOPAMIDOL (ISOVUE-300) INJECTION 61%
100.0000 mL | Freq: Once | INTRAVENOUS | Status: AC | PRN
  Administered 2015-11-07: 100 mL via INTRAVENOUS

## 2015-11-10 ENCOUNTER — Encounter: Payer: Self-pay | Admitting: Hematology and Oncology

## 2015-11-10 ENCOUNTER — Ambulatory Visit (HOSPITAL_BASED_OUTPATIENT_CLINIC_OR_DEPARTMENT_OTHER): Payer: Medicare Other

## 2015-11-10 ENCOUNTER — Other Ambulatory Visit (HOSPITAL_BASED_OUTPATIENT_CLINIC_OR_DEPARTMENT_OTHER): Payer: Medicare Other

## 2015-11-10 ENCOUNTER — Ambulatory Visit (HOSPITAL_BASED_OUTPATIENT_CLINIC_OR_DEPARTMENT_OTHER): Payer: Medicare Other | Admitting: Hematology and Oncology

## 2015-11-10 ENCOUNTER — Telehealth: Payer: Self-pay | Admitting: Hematology and Oncology

## 2015-11-10 VITALS — BP 134/78 | HR 72 | Temp 97.9°F | Resp 19 | Ht 69.0 in | Wt 301.9 lb

## 2015-11-10 DIAGNOSIS — C50411 Malignant neoplasm of upper-outer quadrant of right female breast: Secondary | ICD-10-CM

## 2015-11-10 DIAGNOSIS — C7951 Secondary malignant neoplasm of bone: Secondary | ICD-10-CM

## 2015-11-10 DIAGNOSIS — Z452 Encounter for adjustment and management of vascular access device: Secondary | ICD-10-CM

## 2015-11-10 DIAGNOSIS — Z95828 Presence of other vascular implants and grafts: Secondary | ICD-10-CM

## 2015-11-10 DIAGNOSIS — Z5111 Encounter for antineoplastic chemotherapy: Secondary | ICD-10-CM | POA: Diagnosis not present

## 2015-11-10 DIAGNOSIS — Z170 Estrogen receptor positive status [ER+]: Secondary | ICD-10-CM | POA: Diagnosis not present

## 2015-11-10 DIAGNOSIS — I2699 Other pulmonary embolism without acute cor pulmonale: Secondary | ICD-10-CM

## 2015-11-10 DIAGNOSIS — C773 Secondary and unspecified malignant neoplasm of axilla and upper limb lymph nodes: Secondary | ICD-10-CM

## 2015-11-10 DIAGNOSIS — Z7901 Long term (current) use of anticoagulants: Secondary | ICD-10-CM

## 2015-11-10 LAB — CBC WITH DIFFERENTIAL/PLATELET
BASO%: 2.7 % — AB (ref 0.0–2.0)
BASOS ABS: 0.1 10*3/uL (ref 0.0–0.1)
EOS%: 1.8 % (ref 0.0–7.0)
Eosinophils Absolute: 0.1 10*3/uL (ref 0.0–0.5)
HEMATOCRIT: 34.6 % — AB (ref 34.8–46.6)
HEMOGLOBIN: 11.4 g/dL — AB (ref 11.6–15.9)
LYMPH#: 1 10*3/uL (ref 0.9–3.3)
LYMPH%: 25.7 % (ref 14.0–49.7)
MCH: 30.4 pg (ref 25.1–34.0)
MCHC: 32.9 g/dL (ref 31.5–36.0)
MCV: 92.3 fL (ref 79.5–101.0)
MONO#: 0.5 10*3/uL (ref 0.1–0.9)
MONO%: 13.2 % (ref 0.0–14.0)
NEUT#: 2.2 10*3/uL (ref 1.5–6.5)
NEUT%: 56.6 % (ref 38.4–76.8)
Platelets: 243 10*3/uL (ref 145–400)
RBC: 3.75 10*6/uL (ref 3.70–5.45)
RDW: 23.7 % — AB (ref 11.2–14.5)
WBC: 3.9 10*3/uL (ref 3.9–10.3)

## 2015-11-10 LAB — COMPREHENSIVE METABOLIC PANEL
ALBUMIN: 3.6 g/dL (ref 3.5–5.0)
ALK PHOS: 76 U/L (ref 40–150)
ALT: 15 U/L (ref 0–55)
AST: 14 U/L (ref 5–34)
Anion Gap: 10 mEq/L (ref 3–11)
BILIRUBIN TOTAL: 0.37 mg/dL (ref 0.20–1.20)
BUN: 18 mg/dL (ref 7.0–26.0)
CO2: 25 mEq/L (ref 22–29)
CREATININE: 0.8 mg/dL (ref 0.6–1.1)
Calcium: 9.5 mg/dL (ref 8.4–10.4)
Chloride: 106 mEq/L (ref 98–109)
EGFR: 81 mL/min/{1.73_m2} — ABNORMAL LOW (ref >90)
GLUCOSE: 100 mg/dL (ref 70–140)
Potassium: 4.1 mEq/L (ref 3.5–5.1)
SODIUM: 141 meq/L (ref 136–145)
TOTAL PROTEIN: 6.7 g/dL (ref 6.4–8.3)

## 2015-11-10 MED ORDER — SODIUM CHLORIDE 0.9 % IJ SOLN
10.0000 mL | INTRAMUSCULAR | Status: DC | PRN
  Administered 2015-11-10: 10 mL via INTRAVENOUS
  Filled 2015-11-10: qty 10

## 2015-11-10 MED ORDER — FULVESTRANT 250 MG/5ML IM SOLN
500.0000 mg | INTRAMUSCULAR | Status: DC
  Administered 2015-11-10: 500 mg via INTRAMUSCULAR
  Filled 2015-11-10: qty 10

## 2015-11-10 MED ORDER — PALBOCICLIB 125 MG PO CAPS: 125.0000 mg | ORAL_CAPSULE | Freq: Every day | ORAL | Status: DC

## 2015-11-10 MED ORDER — HEPARIN SOD (PORK) LOCK FLUSH 100 UNIT/ML IV SOLN
500.0000 [IU] | Freq: Once | INTRAVENOUS | Status: AC | PRN
  Administered 2015-11-10: 500 [IU] via INTRAVENOUS
  Filled 2015-11-10: qty 5

## 2015-11-10 MED FILL — *IBRANCE 125 MG CAPSULE: 125 | 21 days supply | Qty: 21 | Fill #0

## 2015-11-10 NOTE — Assessment & Plan Note (Signed)
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 Followed by 12 cycles of weekly Abraxane completed 07/19/2014 S/P Mastectomy 09/22/14: Bilateral mastectomies: Right: IDC Grade 2, 2.7cm, LVI, PNI, 6/8 LN Positive, Er 90%, PR 5% T2N2 (Stage IIIA) Nonhealing right breast wound: finally healed Adjuvant radiation therapy started 03/22/2015 to complete 05/11/2015  Treatment Summary: antiestrogen therapy with anastrozole 1 mg daily Jan 2017- April 2017 (stopped when she got diagnosed with metastatic disease) Anastrozole toxicities: Hot flashes for which she is prescribed gabapentin 300 twice a day  PET/CT scan 08/08/2015: Multifocal areas of increased hypermetabolic is in new/progressive but without definite underlying lesion on CT scan suspicious for metastases, T5, T7, left sacrum, left iliac bone; 13 mm gastrohepatic node SUV 5.2 Suspicious for nodal metastases. ----------------------------------------------------------------------------------------------------------------------------------------------------------------- Met diseaseTreatment plan:Ibrance with Faslodex. Started 08/12/2015 ; today cycle 3 Ibrance toxicities:  1. Mild fatigue 2. Mild nausea Patient got a grant to pay for [employer]. Blood work today was reviewed [place] is 1500.   Bone metastases XGeva once every 3 months   CT of her chest abdomen pelvis with contrast 11/07/2015: Partial improvement of pulmonary embolism, mild progression of patchy groundglass opacity right upper lobe probably inflammatory due to pulmonary embolism, stable bone metastases, no evidence of extraosseous metastases.  Patient plans to go to a beach vacation from July 4 along with her family to [place].  Return to clinic in 1 month for blood work and follow-up.

## 2015-11-10 NOTE — Patient Instructions (Signed)

## 2015-11-10 NOTE — Telephone Encounter (Signed)
Added appts to pt schedule. Pt will get dates/time from [place]

## 2015-11-10 NOTE — Patient Instructions (Signed)

## 2015-11-10 NOTE — Progress Notes (Signed)
Patient Care Team: Jacelyn Pi, MD as PCP - General (Endocrinology)  DIAGNOSIS: Breast cancer of upper-outer quadrant of right female breast [facility])   Staging form: Breast, AJCC 7th Edition     Clinical: Stage IIIA (T3, N1, M1) - Signed by Rulon Eisenmenger, MD on 03/05/2014     Pathologic: T3, N1a, M1 - Unsigned   SUMMARY OF ONCOLOGIC HISTORY:   Breast cancer of upper-outer quadrant of right female breast ([place])   02/02/2014 Mammogram Right breast: 5.8 cm irregular high-density solid mass suggestive of malignancy; left breast next millimeter internal mammary lymph node   02/04/2014 Initial Biopsy  2 biopsies were performed the right breast and axilla: Grade 2 invasive ductal carcinoma ER/PR positive HER-2 negative Ki-67 20% to 47%: Biopsy of right humerus metastatic breast cancer estrogen positive   02/17/2014 PET scan Hypermetabolic right breast cancer and right axillary lymph nodes subpectoral lymph nodes indeterminate right middle lobe pulmonary nodule and hypermetabolic proximal right humerus lesion   03/05/2014 - 07/19/2014 Neo-Adjuvant Chemotherapy Dose dense Adriamycin and Cytoxan x4 cycles followed by Abraxane weekly x12   07/26/2014 Breast MRI Multifocal breast cancer decreased in size, retroareolar 13 mm now 11 mm, middle third 22 mm now 18 mm, 3.9 x 3.9 cm now 3.9 x 2.6 cm, multiple smaller nodules decreased in size, right x-ray lymph node 3.6 cm now 2 cm other lymph nodes are smaller   07/28/2014 - 08/12/2015 Anti-estrogen oral therapy Anastrozole 1 mg daily   08/02/2014 PET scan Interval improvement in the right breast mass and axillary/subpectoral lymph nodes and right humerus head. Several subpleural nodules are not visible, 3.4. cm adrenal adenoma   09/22/2014 Surgery Bilateral mastectomy: Right: IDC Grade 2, 2.7cm, LVI, PNI, 6/8 LN Positive, Er 90%, PR 5% T2N2   03/22/2015 -  Radiation Therapy adjuvant radiation therapy with Dr. Isidore Moos   08/10/2015 PET scan Progression of disease with  several new bone metastases, T5, T7, left sacrum, left iliac bone, gastrohepatic lymph node   08/12/2015 -  Anti-estrogen oral therapy Faslodex with Ibrance    CHIEF COMPLIANT: Follow-up after recent hospitalization for pulmonary embolism  INTERVAL HISTORY: Tonya Alvarez is a 70 year old with above-mentioned history metastatic breast cancer with bone metastases currently on treatment with Faslodex and Ibrance. She has been tolerating the treatment extremely well. She is on the full dose of 125 mg of Ibrance. She has not started bone treatment with bisphosphonates because she recently had tooth extraction. She was hospitalized in June with shortness of breath and chest pain was diagnosed with bilateral pulmonary emboli. She was placed on Zaroxolyn was discharged home. The CT scan showed patent foraminal ovale which did not need any intervention. She reports marked improvement in her breathing and chest pain. She also underwent recent CT scans for restaging of metastatic breast cancer and is here today to discuss the results.  REVIEW OF SYSTEMS:   Constitutional: Denies fevers, chills or abnormal weight loss Eyes: Denies blurriness of vision Ears, nose, mouth, throat, and face: Denies mucositis or sore throat Respiratory: Shortness of breath to exertion Cardiovascular: Denies palpitation, chest discomfort Gastrointestinal:  Denies nausea, heartburn or change in bowel habits Skin: Denies abnormal skin rashes Lymphatics: Denies new lymphadenopathy or easy bruising Neurological:Denies numbness, tingling or new weaknesses Behavioral/Psych: Mood is stable, no new changes  Extremities: 1+ lower extremity edema, erythema on the soles of her feet  All other systems were reviewed with the patient and are negative.  I have reviewed the past medical history, past  surgical history, social history and family history with the patient and they are unchanged from previous note.  ALLERGIES:  is allergic to  amoxicillin; hydrocodone; and lisinopril.  MEDICATIONS:  Current Outpatient Prescriptions  Medication Sig Dispense Refill  . aspirin EC 81 MG tablet Take 81 mg by mouth at bedtime.    . calcium-vitamin D (OSCAL WITH D) 500-200 MG-UNIT per tablet Take 1 tablet by mouth daily.    . clindamycin (CLEOCIN) 300 MG capsule Take 900 mg by mouth See admin instructions. Take 3 capsules (900 mg) by mouth one hour prior to dental appointment - last appointment October 11, 2015    . fluticasone (FLONASE) 50 MCG/ACT nasal spray Place 1 spray into both nostrils daily as needed for allergies or rhinitis. (Patient taking differently: Place 1 spray into both nostrils at bedtime as needed for allergies or rhinitis. ) 16 g 0  . gabapentin (NEURONTIN) 300 MG capsule Take 300 mg by mouth 3 (three) times daily.     Ilda Foil 125 MG capsule TAKE 1 CAPSULE BY MOUTH ONCE DAILY WITH BREAKFAST. TAKE WHOLE WITH FOOD FOR 21 DAYS THEN 7 DAYS OFF 21 capsule 0  . ibuprofen (ADVIL,MOTRIN) 600 MG tablet Take 600 mg by mouth every 12 (twelve) hours.    . insulin lispro protamine-lispro (HUMALOG 75/25 MIX) (75-25) 100 UNIT/ML SUSP injection Inject 10-60 Units into the skin See admin instructions. Inject 60 units subcutaneously with breakfast and supper, inject 10 mls after lunch if CBG >150    . lidocaine-prilocaine (EMLA) cream Apply 1 application topically as needed. (Patient taking differently: Apply 1 application topically as needed (prior to flushing of port (every 4-6 weeks)). ) 30 g 6  . losartan (COZAAR) 50 MG tablet Take 50 mg by mouth daily.     . metFORMIN (GLUCOPHAGE-XR) 500 MG 24 hr tablet Take 2 tablets (1,000 mg total) by mouth 2 (two) times daily.    . Multiple Vitamin (MULTIVITAMIN WITH MINERALS) TABS tablet Take 1 tablet by mouth at bedtime.    . Rivaroxaban (XARELTO) 15 MG TABS tablet Take 1 tablet (15 mg total) by mouth 2 (two) times daily with a meal. 42 tablet 0  . [START ON 11/17/2015] rivaroxaban (XARELTO) 20 MG  TABS tablet Take 1 tablet (20 mg total) by mouth daily with supper. 90 tablet 0  . simvastatin (ZOCOR) 20 MG tablet Take 20 mg by mouth at bedtime.     No current facility-administered medications for this visit.    PHYSICAL EXAMINATION: ECOG PERFORMANCE STATUS: 2 - Symptomatic, <50% confined to bed  Filed Vitals:   11/10/15 1459  BP: 134/78  Pulse: 72  Temp: 97.9 F (36.6 C)  Resp: 19   Filed Weights   11/10/15 1459  Weight: 301 lb 14.4 oz (136.941 kg)    GENERAL:alert, no distress and comfortable SKIN: skin color, texture, turgor are normal, no rashes or significant lesions EYES: normal, Conjunctiva are pink and non-injected, sclera clear OROPHARYNX:no exudate, no erythema and lips, buccal mucosa, and tongue normal  NECK: supple, thyroid normal size, non-tender, without nodularity LYMPH:  no palpable lymphadenopathy in the cervical, axillary or inguinal LUNGS: clear to auscultation and percussion with normal breathing effort HEART: regular rate & rhythm and no murmurs and no lower extremity edema ABDOMEN:abdomen soft, non-tender and normal bowel sounds MUSCULOSKELETAL:no cyanosis of digits and no clubbing  NEURO: alert & oriented x 3 with fluent speech, no focal motor/sensory deficits EXTREMITIES: 1+ lower extremity edema, redness on the bottom of her feet  LABORATORY DATA:  I have reviewed the data as listed   Chemistry      Component Value Date/Time   NA 137 10/24/2015 0431   NA 140 10/10/2015 1423   K 4.0 10/24/2015 0431   K 4.2 10/10/2015 1423   CL 107 10/24/2015 0431   CO2 24 10/24/2015 0431   CO2 26 10/10/2015 1423   BUN 10 10/24/2015 0431   BUN 15.3 10/10/2015 1423   CREATININE 0.70 10/24/2015 0431   CREATININE 0.8 10/10/2015 1423      Component Value Date/Time   CALCIUM 8.7* 10/24/2015 0431   CALCIUM 9.5 10/10/2015 1423   ALKPHOS 75 10/10/2015 1423   ALKPHOS 74 08/01/2010 1746   AST 11 10/10/2015 1423   AST 26 08/01/2010 1746   ALT 11 10/10/2015  1423   ALT 20 08/01/2010 1746   BILITOT 0.34 10/10/2015 1423   BILITOT 0.4 08/01/2010 1746       Lab Results  Component Value Date   WBC 3.9 11/10/2015   HGB 11.4* 11/10/2015   HCT 34.6* 11/10/2015   MCV 92.3 11/10/2015   PLT 243 11/10/2015   NEUTROABS 2.2 11/10/2015     ASSESSMENT & PLAN:  Breast cancer of upper-outer quadrant of right female breast ([place]) Right breast invasive ductal carcinoma T3 N1 M1 stage IV ER 90%, PR 80%, HER-2/neu negative, currently on neoadjuvant chemotherapy completed 4 cycles of dose dense Adriamycin and Cytoxan started on 03/05/2014. Followed by 12 cycles of weekly Abraxane completed 07/19/2014 S/P Mastectomy 09/22/14: Bilateral mastectomies: Right: IDC Grade 2, 2.7cm, LVI, PNI, 6/8 LN Positive, Er 90%, PR 5% T2N2 (Stage IIIA) Nonhealing right breast wound: finally healed Adjuvant radiation therapy started 03/22/2015 to complete 05/11/2015  Treatment Summary: antiestrogen therapy with anastrozole 1 mg daily Jan 2017- April 2017 (stopped when she got diagnosed with metastatic disease) Anastrozole toxicities: Hot flashes for which she is prescribed gabapentin 300 twice a day  PET/CT scan 08/08/2015: Multifocal areas of increased hypermetabolic is in new/progressive but without definite underlying lesion on CT scan suspicious for metastases, T5, T7, left sacrum, left iliac bone; 13 mm gastrohepatic node SUV 5.2 Suspicious for nodal metastases. ----------------------------------------------------------------------------------------------------------------------------------------------------------------- Met diseaseTreatment plan:Ibrance with Faslodex. Started 08/12/2015 ; today cycle 4 Ibrance toxicities:  1. Mild fatigue 2. Mild nausea Patient got a grant to pay for [employer]. Blood work today was reviewed [place] is 2[address].   Bone metastases XGeva once every 3 months (to start when she returns in September)  Pulmonary emboli: Currently on xarelto CT of her  chest abdomen pelvis with contrast 11/07/2015: Partial improvement of pulmonary embolism, mild progression of patchy groundglass opacity right upper lobe probably inflammatory due to pulmonary embolism, stable bone metastases, no evidence of extraosseous metastases.  Return to clinic in 2 month for blood work and follow-up.    No orders of the defined types were placed in this encounter.   The patient has a good understanding of the overall plan. she agrees with it. she will call with any problems that may develop before the next visit here.   Rulon Eisenmenger, MD 11/10/2015

## 2015-11-29 ENCOUNTER — Encounter: Payer: Medicare Other | Admitting: Physician Assistant

## 2015-11-30 MED FILL — *IBRANCE 125 MG CAPSULE: 125 | 21 days supply | Qty: 21 | Fill #1

## 2015-12-02 ENCOUNTER — Encounter: Payer: Medicare Other | Admitting: Physician Assistant

## 2015-12-08 ENCOUNTER — Other Ambulatory Visit (HOSPITAL_BASED_OUTPATIENT_CLINIC_OR_DEPARTMENT_OTHER): Payer: Medicare Other

## 2015-12-08 ENCOUNTER — Ambulatory Visit (HOSPITAL_BASED_OUTPATIENT_CLINIC_OR_DEPARTMENT_OTHER): Payer: Medicare Other

## 2015-12-08 VITALS — BP 128/64 | HR 74 | Temp 98.3°F | Resp 20

## 2015-12-08 DIAGNOSIS — Z95828 Presence of other vascular implants and grafts: Secondary | ICD-10-CM

## 2015-12-08 DIAGNOSIS — C50411 Malignant neoplasm of upper-outer quadrant of right female breast: Secondary | ICD-10-CM

## 2015-12-08 DIAGNOSIS — Z5111 Encounter for antineoplastic chemotherapy: Secondary | ICD-10-CM | POA: Diagnosis not present

## 2015-12-08 DIAGNOSIS — Z452 Encounter for adjustment and management of vascular access device: Secondary | ICD-10-CM | POA: Diagnosis not present

## 2015-12-08 LAB — COMPREHENSIVE METABOLIC PANEL
ALBUMIN: 3.5 g/dL (ref 3.5–5.0)
ALK PHOS: 77 U/L (ref 40–150)
ALT: 12 U/L (ref 0–55)
AST: 13 U/L (ref 5–34)
Anion Gap: 9 mEq/L (ref 3–11)
BILIRUBIN TOTAL: 0.35 mg/dL (ref 0.20–1.20)
BUN: 17.5 mg/dL (ref 7.0–26.0)
CALCIUM: 9.5 mg/dL (ref 8.4–10.4)
CO2: 24 mEq/L (ref 22–29)
Chloride: 107 mEq/L (ref 98–109)
Creatinine: 0.7 mg/dL (ref 0.6–1.1)
EGFR: 86 mL/min/{1.73_m2} — ABNORMAL LOW (ref >90)
GLUCOSE: 83 mg/dL (ref 70–140)
Potassium: 4.3 mEq/L (ref 3.5–5.1)
Sodium: 140 mEq/L (ref 136–145)
Total Protein: 6.6 g/dL (ref 6.4–8.3)

## 2015-12-08 LAB — CBC WITH DIFFERENTIAL/PLATELET
BASO%: 3.4 % — AB (ref 0.0–2.0)
BASOS ABS: 0.1 10*3/uL (ref 0.0–0.1)
EOS ABS: 0 10*3/uL (ref 0.0–0.5)
EOS%: 1.3 % (ref 0.0–7.0)
HEMATOCRIT: 32.6 % — AB (ref 34.8–46.6)
HGB: 10.9 g/dL — ABNORMAL LOW (ref 11.6–15.9)
LYMPH#: 1 10*3/uL (ref 0.9–3.3)
LYMPH%: 33.2 % (ref 14.0–49.7)
MCH: 31.6 pg (ref 25.1–34.0)
MCHC: 33.4 g/dL (ref 31.5–36.0)
MCV: 94.5 fL (ref 79.5–101.0)
MONO#: 0.4 10*3/uL (ref 0.1–0.9)
MONO%: 11.7 % (ref 0.0–14.0)
NEUT#: 1.5 10*3/uL (ref 1.5–6.5)
NEUT%: 50.4 % (ref 38.4–76.8)
PLATELETS: 165 10*3/uL (ref 145–400)
RBC: 3.45 10*6/uL — ABNORMAL LOW (ref 3.70–5.45)
RDW: 18.9 % — ABNORMAL HIGH (ref 11.2–14.5)
WBC: 3 10*3/uL — ABNORMAL LOW (ref 3.9–10.3)

## 2015-12-08 MED ORDER — SODIUM CHLORIDE 0.9 % IJ SOLN
10.0000 mL | INTRAMUSCULAR | Status: DC | PRN
  Administered 2015-12-08: 10 mL via INTRAVENOUS
  Filled 2015-12-08: qty 10

## 2015-12-08 MED ORDER — FULVESTRANT 250 MG/5ML IM SOLN
500.0000 mg | INTRAMUSCULAR | Status: DC
  Administered 2015-12-08: 500 mg via INTRAMUSCULAR
  Filled 2015-12-08: qty 10

## 2015-12-08 MED ORDER — HEPARIN SOD (PORK) LOCK FLUSH 100 UNIT/ML IV SOLN
500.0000 [IU] | Freq: Once | INTRAVENOUS | Status: AC | PRN
  Administered 2015-12-08: 500 [IU] via INTRAVENOUS
  Filled 2015-12-08: qty 5

## 2015-12-08 MED ORDER — ALTEPLASE 2 MG IJ SOLR
2.0000 mg | Freq: Once | INTRAMUSCULAR | Status: DC | PRN
  Filled 2015-12-08: qty 2

## 2015-12-08 NOTE — Patient Instructions (Signed)
Denosumab injection What is this medicine? DENOSUMAB (den oh sue mab) slows bone breakdown. Prolia is used to treat osteoporosis in women after menopause and in men. Xgeva is used to prevent bone fractures and other bone problems caused by cancer bone metastases. Xgeva is also used to treat giant cell tumor of the bone. This medicine may be used for other purposes; ask your health care provider or pharmacist if you have questions. What should I tell my health care provider before I take this medicine? They need to know if you have any of these conditions: -dental disease -eczema -infection or history of infections -kidney disease or on dialysis -low blood calcium or vitamin D -malabsorption syndrome -scheduled to have surgery or tooth extraction -taking medicine that contains denosumab -thyroid or parathyroid disease -an unusual reaction to denosumab, other medicines, foods, dyes, or preservatives -pregnant or trying to get pregnant -breast-feeding How should I use this medicine? This medicine is for injection under the skin. It is given by a health care professional in a hospital or clinic setting. If you are getting Prolia, a special MedGuide will be given to you by the pharmacist with each prescription and refill. Be sure to read this information carefully each time. For Prolia, talk to your pediatrician regarding the use of this medicine in children. Special care may be needed. For Xgeva, talk to your pediatrician regarding the use of this medicine in children. While this drug may be prescribed for children as young as 13 years for selected conditions, precautions do apply. Overdosage: If you think you have taken too much of this medicine contact a poison control center or emergency room at once. NOTE: This medicine is only for you. Do not share this medicine with others. What if I miss a dose? It is important not to miss your dose. Call your doctor or health care professional if you are  unable to keep an appointment. What may interact with this medicine? Do not take this medicine with any of the following medications: -other medicines containing denosumab This medicine may also interact with the following medications: -medicines that suppress the immune system -medicines that treat cancer -steroid medicines like prednisone or cortisone This list may not describe all possible interactions. Give your health care provider a list of all the medicines, herbs, non-prescription drugs, or dietary supplements you use. Also tell them if you smoke, drink alcohol, or use illegal drugs. Some items may interact with your medicine. What should I watch for while using this medicine? Visit your doctor or health care professional for regular checks on your progress. Your doctor or health care professional may order blood tests and other tests to see how you are doing. Call your doctor or health care professional if you get a cold or other infection while receiving this medicine. Do not treat yourself. This medicine may decrease your body's ability to fight infection. You should make sure you get enough calcium and vitamin D while you are taking this medicine, unless your doctor tells you not to. Discuss the foods you eat and the vitamins you take with your health care professional. See your dentist regularly. Brush and floss your teeth as directed. Before you have any dental work done, tell your dentist you are receiving this medicine. Do not become pregnant while taking this medicine or for 5 months after stopping it. Women should inform their doctor if they wish to become pregnant or think they might be pregnant. There is a potential for serious side effects   to an unborn child. Talk to your health care professional or pharmacist for more information. What side effects may I notice from receiving this medicine? Side effects that you should report to your doctor or health care professional as soon as  possible: -allergic reactions like skin rash, itching or hives, swelling of the face, lips, or tongue -breathing problems -chest pain -fast, irregular heartbeat -feeling faint or lightheaded, falls -fever, chills, or any other sign of infection -muscle spasms, tightening, or twitches -numbness or tingling -skin blisters or bumps, or is dry, peels, or red -slow healing or unexplained pain in the mouth or jaw -unusual bleeding or bruising Side effects that usually do not require medical attention (Report these to your doctor or health care professional if they continue or are bothersome.): -muscle pain -stomach upset, gas This list may not describe all possible side effects. Call your doctor for medical advice about side effects. You may report side effects to FDA at 1-800-FDA-1088. Where should I keep my medicine? This medicine is only given in a clinic, doctor's office, or other health care setting and will not be stored at home. NOTE: This sheet is a summary. It may not cover all possible information. If you have questions about this medicine, talk to your doctor, pharmacist, or health care provider.    2016, Elsevier/Gold Standard. (2011-10-22 12:37:47) Fulvestrant injection What is this medicine? FULVESTRANT (ful VES trant) blocks the effects of estrogen. It is used to treat breast cancer. This medicine may be used for other purposes; ask your health care provider or pharmacist if you have questions. What should I tell my health care provider before I take this medicine? They need to know if you have any of these conditions: -bleeding problems -liver disease -low levels of platelets in the blood -an unusual or allergic reaction to fulvestrant, other medicines, foods, dyes, or preservatives -pregnant or trying to get pregnant -breast-feeding How should I use this medicine? This medicine is for injection into a muscle. It is usually given by a health care professional in a  hospital or clinic setting. Talk to your pediatrician regarding the use of this medicine in children. Special care may be needed. Overdosage: If you think you have taken too much of this medicine contact a poison control center or emergency room at once. NOTE: This medicine is only for you. Do not share this medicine with others. What if I miss a dose? It is important not to miss your dose. Call your doctor or health care professional if you are unable to keep an appointment. What may interact with this medicine? -medicines that treat or prevent blood clots like warfarin, enoxaparin, and dalteparin This list may not describe all possible interactions. Give your health care provider a list of all the medicines, herbs, non-prescription drugs, or dietary supplements you use. Also tell them if you smoke, drink alcohol, or use illegal drugs. Some items may interact with your medicine. What should I watch for while using this medicine? Your condition will be monitored carefully while you are receiving this medicine. You will need important blood work done while you are taking this medicine. Do not become pregnant while taking this medicine or for at least 1 year after stopping it. Women of child-bearing potential will need to have a negative pregnancy test before starting this medicine. Women should inform their doctor if they wish to become pregnant or think they might be pregnant. There is a potential for serious side effects to an unborn child. Men   should inform their doctors if they wish to father a child. This medicine may lower sperm counts. Talk to your health care professional or pharmacist for more information. Do not breast-feed an infant while taking this medicine or for 1 year after the last dose. What side effects may I notice from receiving this medicine? Side effects that you should report to your doctor or health care professional as soon as possible: -allergic reactions like skin rash,  itching or hives, swelling of the face, lips, or tongue -feeling faint or lightheaded, falls -pain, tingling, numbness, or weakness in the legs -signs and symptoms of infection like fever or chills; cough; flu-like symptoms; sore throat -vaginal bleeding Side effects that usually do not require medical attention (report to your doctor or health care professional if they continue or are bothersome): -aches, pains -constipation -diarrhea -headache -hot flashes -nausea, vomiting -pain at site where injected -stomach pain This list may not describe all possible side effects. Call your doctor for medical advice about side effects. You may report side effects to FDA at 1-800-FDA-1088. Where should I keep my medicine? This drug is given in a hospital or clinic and will not be stored at home. NOTE: This sheet is a summary. It may not cover all possible information. If you have questions about this medicine, talk to your doctor, pharmacist, or health care provider.    2016, Elsevier/Gold Standard. (2014-11-19 11:03:55)  

## 2015-12-27 MED FILL — *IBRANCE 125 MG CAPSULE: 125 | 21 days supply | Qty: 21 | Fill #2

## 2015-12-28 ENCOUNTER — Encounter: Payer: Self-pay | Admitting: Hematology and Oncology

## 2015-12-28 ENCOUNTER — Telehealth: Payer: Self-pay | Admitting: Pharmacist

## 2015-12-28 NOTE — Telephone Encounter (Signed)
Oral Chemotherapy Pharmacist Encounter   Received call from patient I oral chemo clinic on 8/22 for information on co-pay assistance. Pt is currently able to afford her [facility] due to co-pay assistance from the [employer], however will deplete these funds in the near future. She will not be able to re-apply for PAF until next year. There are currently no funds available for metastatic breast cancer from PANF.  I told patient that I would start an application for Tonya Alvarez patient assistance and that she would need to come by [facility] with financial documentation and to sign application before it could be completed and faxed to the foundation.  Pt expressed understanding and gratitude. She will reach out to [name] when she can provide financial documentation and signatures.  Johny Drilling, PharmD, BCPS [facility]otherapy [facility]

## 2015-12-28 NOTE — Progress Notes (Signed)
prior auth form from [employer] [facility]-faxed [phone]- chart shows dr. Karleen Hampshire wrote last script for xarelto- I had faxed the [employer] form 11/01/15 with dr Geralyn Flash info

## 2016-01-12 ENCOUNTER — Encounter: Payer: Self-pay | Admitting: Hematology and Oncology

## 2016-01-12 ENCOUNTER — Ambulatory Visit (HOSPITAL_BASED_OUTPATIENT_CLINIC_OR_DEPARTMENT_OTHER): Payer: Medicare Other

## 2016-01-12 ENCOUNTER — Telehealth: Payer: Self-pay | Admitting: Hematology and Oncology

## 2016-01-12 ENCOUNTER — Other Ambulatory Visit (HOSPITAL_BASED_OUTPATIENT_CLINIC_OR_DEPARTMENT_OTHER): Payer: Medicare Other

## 2016-01-12 ENCOUNTER — Ambulatory Visit (HOSPITAL_BASED_OUTPATIENT_CLINIC_OR_DEPARTMENT_OTHER): Payer: Medicare Other | Admitting: Hematology and Oncology

## 2016-01-12 DIAGNOSIS — I2699 Other pulmonary embolism without acute cor pulmonale: Secondary | ICD-10-CM

## 2016-01-12 DIAGNOSIS — C50411 Malignant neoplasm of upper-outer quadrant of right female breast: Secondary | ICD-10-CM

## 2016-01-12 DIAGNOSIS — Z170 Estrogen receptor positive status [ER+]: Secondary | ICD-10-CM | POA: Diagnosis not present

## 2016-01-12 DIAGNOSIS — Z5111 Encounter for antineoplastic chemotherapy: Secondary | ICD-10-CM | POA: Diagnosis not present

## 2016-01-12 DIAGNOSIS — Z7901 Long term (current) use of anticoagulants: Secondary | ICD-10-CM

## 2016-01-12 DIAGNOSIS — C773 Secondary and unspecified malignant neoplasm of axilla and upper limb lymph nodes: Secondary | ICD-10-CM | POA: Diagnosis not present

## 2016-01-12 DIAGNOSIS — C7951 Secondary malignant neoplasm of bone: Secondary | ICD-10-CM | POA: Diagnosis not present

## 2016-01-12 DIAGNOSIS — Z95828 Presence of other vascular implants and grafts: Secondary | ICD-10-CM

## 2016-01-12 DIAGNOSIS — Z452 Encounter for adjustment and management of vascular access device: Secondary | ICD-10-CM | POA: Diagnosis not present

## 2016-01-12 LAB — CBC WITH DIFFERENTIAL/PLATELET
BASO%: 2.6 % — AB (ref 0.0–2.0)
Basophils Absolute: 0.1 10*3/uL (ref 0.0–0.1)
EOS%: 2.6 % (ref 0.0–7.0)
Eosinophils Absolute: 0.1 10*3/uL (ref 0.0–0.5)
HCT: 32.8 % — ABNORMAL LOW (ref 34.8–46.6)
HGB: 11 g/dL — ABNORMAL LOW (ref 11.6–15.9)
LYMPH%: 27.2 % (ref 14.0–49.7)
MCH: 32.5 pg (ref 25.1–34.0)
MCHC: 33.5 g/dL (ref 31.5–36.0)
MCV: 97 fL (ref 79.5–101.0)
MONO#: 0.2 10*3/uL (ref 0.1–0.9)
MONO%: 5.8 % (ref 0.0–14.0)
NEUT%: 61.8 % (ref 38.4–76.8)
NEUTROS ABS: 1.9 10*3/uL (ref 1.5–6.5)
Platelets: 218 10*3/uL (ref 145–400)
RBC: 3.38 10*6/uL — AB (ref 3.70–5.45)
RDW: 16.3 % — ABNORMAL HIGH (ref 11.2–14.5)
WBC: 3.1 10*3/uL — AB (ref 3.9–10.3)
lymph#: 0.9 10*3/uL (ref 0.9–3.3)
nRBC: 0 % (ref 0–0)

## 2016-01-12 LAB — COMPREHENSIVE METABOLIC PANEL
ALBUMIN: 3.3 g/dL — AB (ref 3.5–5.0)
ALK PHOS: 70 U/L (ref 40–150)
ALT: 10 U/L (ref 0–55)
ANION GAP: 9 meq/L (ref 3–11)
AST: 13 U/L (ref 5–34)
BILIRUBIN TOTAL: 0.31 mg/dL (ref 0.20–1.20)
BUN: 13.1 mg/dL (ref 7.0–26.0)
CALCIUM: 9.3 mg/dL (ref 8.4–10.4)
CO2: 25 mEq/L (ref 22–29)
Chloride: 106 mEq/L (ref 98–109)
Creatinine: 0.8 mg/dL (ref 0.6–1.1)
EGFR: 81 mL/min/{1.73_m2} — AB (ref >90)
Glucose: 162 mg/dl — ABNORMAL HIGH (ref 70–140)
POTASSIUM: 4.4 meq/L (ref 3.5–5.1)
SODIUM: 140 meq/L (ref 136–145)
Total Protein: 6.5 g/dL (ref 6.4–8.3)

## 2016-01-12 MED ORDER — FULVESTRANT 250 MG/5ML IM SOLN
500.0000 mg | INTRAMUSCULAR | Status: DC
  Administered 2016-01-12: 500 mg via INTRAMUSCULAR
  Filled 2016-01-12: qty 10

## 2016-01-12 MED ORDER — DENOSUMAB 120 MG/1.7ML ~~LOC~~ SOLN
120.0000 mg | Freq: Once | SUBCUTANEOUS | Status: AC
  Administered 2016-01-12: 120 mg via SUBCUTANEOUS
  Filled 2016-01-12: qty 1.7

## 2016-01-12 MED ORDER — HEPARIN SOD (PORK) LOCK FLUSH 100 UNIT/ML IV SOLN
500.0000 [IU] | Freq: Once | INTRAVENOUS | Status: AC | PRN
Start: 2016-01-12 — End: 2016-01-12
  Administered 2016-01-12: 500 [IU] via INTRAVENOUS
  Filled 2016-01-12: qty 5

## 2016-01-12 MED ORDER — SODIUM CHLORIDE 0.9 % IJ SOLN
10.0000 mL | INTRAMUSCULAR | Status: DC | PRN
  Administered 2016-01-12: 10 mL via INTRAVENOUS
  Filled 2016-01-12: qty 10

## 2016-01-12 NOTE — Progress Notes (Signed)
Patient Care Team: Jacelyn Pi, MD as PCP - General (Endocrinology)  DIAGNOSIS: Breast cancer of upper-outer quadrant of right female breast [facility])   Staging form: Breast, AJCC 7th Edition   - Clinical: Stage IIIA (T3, N1, M1) - Signed by Rulon Eisenmenger, MD on 03/05/2014   - Pathologic: T3, N1a, M1 - Unsigned  SUMMARY OF ONCOLOGIC HISTORY:   Breast cancer of upper-outer quadrant of right female breast ([place])   02/02/2014 Mammogram    Right breast: 5.8 cm irregular high-density solid mass suggestive of malignancy; left breast next millimeter internal mammary lymph node      02/04/2014 Initial Biopsy     2 biopsies were performed the right breast and axilla: Grade 2 invasive ductal carcinoma ER/PR positive HER-2 negative Ki-67 20% to 47%: Biopsy of right humerus metastatic breast cancer estrogen positive      02/17/2014 PET scan    Hypermetabolic right breast cancer and right axillary lymph nodes subpectoral lymph nodes indeterminate right middle lobe pulmonary nodule and hypermetabolic proximal right humerus lesion      03/05/2014 - 07/19/2014 Neo-Adjuvant Chemotherapy    Dose dense Adriamycin and Cytoxan x4 cycles followed by Abraxane weekly x12      07/26/2014 Breast MRI    Multifocal breast cancer decreased in size, retroareolar 13 mm now 11 mm, middle third 22 mm now 18 mm, 3.9 x 3.9 cm now 3.9 x 2.6 cm, multiple smaller nodules decreased in size, right x-ray lymph node 3.6 cm now 2 cm other lymph nodes are smaller      07/28/2014 - 08/12/2015 Anti-estrogen oral therapy    Anastrozole 1 mg daily      08/02/2014 PET scan    Interval improvement in the right breast mass and axillary/subpectoral lymph nodes and right humerus head. Several subpleural nodules are not visible, 3.4. cm adrenal adenoma      09/22/2014 Surgery    Bilateral mastectomy: Right: IDC Grade 2, 2.7cm, LVI, PNI, 6/8 LN Positive, Er 90%, PR 5% T2N2      03/22/2015 -  Radiation Therapy    adjuvant radiation  therapy with Dr. Isidore Moos      08/10/2015 PET scan    Progression of disease with several new bone metastases, T5, T7, left sacrum, left iliac bone, gastrohepatic lymph node      08/12/2015 -  Anti-estrogen oral therapy    Faslodex with Brock Bad every 3 months for bone metastases       CHIEF COMPLIANT: follow-up and Ibrance with letrozole  INTERVAL HISTORY: Tonya Alvarez is a 70 year old with above-mentioned history metastatic breast cancer currently on Ibrance with letrozole. She appears to be tolerating Ibrance extremely well. She does not have any fatigue or nausea or vomiting. Recently she had a major issue in her house with the leaking water heater. She is still struggling with getting insurance to pay for the repairs. Apart from that she appears to be doing quite well. Denies any hot flashes or myalgias and letrozole.  REVIEW OF SYSTEMS:   Constitutional: Denies fevers, chills or abnormal weight loss Eyes: Denies blurriness of vision Ears, nose, mouth, throat, and face: Denies mucositis or sore throat Respiratory: Denies cough, dyspnea or wheezes Cardiovascular: Denies palpitation, chest discomfort Gastrointestinal:  Denies nausea, heartburn or change in bowel habits Skin: Denies abnormal skin rashes Lymphatics: Denies new lymphadenopathy or easy bruising Neurological:Denies numbness, tingling or new weaknesses Behavioral/Psych: Mood is stable, no new changes  Extremities: No lower extremity edema Breast:  denies any pain  or lumps or nodules in either breasts All other systems were reviewed with the patient and are negative.  I have reviewed the past medical history, past surgical history, social history and family history with the patient and they are unchanged from previous note.  ALLERGIES:  is allergic to amoxicillin; hydrocodone; and lisinopril.  MEDICATIONS:  Current Outpatient Prescriptions  Medication Sig Dispense Refill  . calcium-vitamin D (OSCAL WITH D)  500-200 MG-UNIT per tablet Take 1 tablet by mouth daily.    . clindamycin (CLEOCIN) 300 MG capsule Take 900 mg by mouth See admin instructions. Take 3 capsules (900 mg) by mouth one hour prior to dental appointment - last appointment October 11, 2015    . fluticasone (FLONASE) 50 MCG/ACT nasal spray Place 1 spray into both nostrils daily as needed for allergies or rhinitis. (Patient taking differently: Place 1 spray into both nostrils at bedtime as needed for allergies or rhinitis. ) 16 g 0  . gabapentin (NEURONTIN) 300 MG capsule Take 300 mg by mouth 3 (three) times daily.     Marland Kitchen ibuprofen (ADVIL,MOTRIN) 600 MG tablet Take 600 mg by mouth every 12 (twelve) hours.    . insulin lispro protamine-lispro (HUMALOG 75/25 MIX) (75-25) 100 UNIT/ML SUSP injection Inject 10-60 Units into the skin See admin instructions. Inject 60 units subcutaneously with breakfast and supper, inject 10 mls after lunch if CBG >150    . lidocaine-prilocaine (EMLA) cream Apply 1 application topically as needed. (Patient taking differently: Apply 1 application topically as needed (prior to flushing of port (every 4-6 weeks)). ) 30 g 6  . losartan (COZAAR) 50 MG tablet Take 50 mg by mouth daily.     . metFORMIN (GLUCOPHAGE-XR) 500 MG 24 hr tablet Take 2 tablets (1,000 mg total) by mouth 2 (two) times daily.    . Multiple Vitamin (MULTIVITAMIN WITH MINERALS) TABS tablet Take 1 tablet by mouth at bedtime.    . palbociclib (IBRANCE) 125 MG capsule Take 1 capsule (125 mg total) by mouth daily with breakfast. Take whole with food. 21 capsule 3  . rivaroxaban (XARELTO) 20 MG TABS tablet Take 1 tablet (20 mg total) by mouth daily with supper. 90 tablet 0  . simvastatin (ZOCOR) 20 MG tablet Take 20 mg by mouth at bedtime.     No current facility-administered medications for this visit.    Facility-Administered Medications Ordered in Other Visits  Medication Dose Route Frequency Provider Last Rate Last Dose  . fulvestrant (FASLODEX) injection  500 mg  500 mg Intramuscular Q30 days Nicholas Lose, MD   500 mg at 11/10/15 1551  . fulvestrant (FASLODEX) injection 500 mg  500 mg Intramuscular Q30 days Nicholas Lose, MD   500 mg at 01/12/16 1511    PHYSICAL EXAMINATION: ECOG PERFORMANCE STATUS: 1 - Symptomatic but completely ambulatory  Vitals:   01/12/16 1416  BP: (!) 141/73  Pulse: 65  Resp: 18  Temp: 97.8 F (36.6 C)   Filed Weights   01/12/16 1416  Weight: (!) 304 lb (137.9 kg)    GENERAL:alert, no distress and comfortable SKIN: skin color, texture, turgor are normal, no rashes or significant lesions EYES: normal, Conjunctiva are pink and non-injected, sclera clear OROPHARYNX:no exudate, no erythema and lips, buccal mucosa, and tongue normal  NECK: supple, thyroid normal size, non-tender, without nodularity LYMPH:  no palpable lymphadenopathy in the cervical, axillary or inguinal LUNGS: clear to auscultation and percussion with normal breathing effort HEART: regular rate & rhythm and no murmurs and no lower extremity  edema ABDOMEN:abdomen soft, non-tender and normal bowel sounds MUSCULOSKELETAL:no cyanosis of digits and no clubbing  NEURO: alert & oriented x 3 with fluent speech, no focal motor/sensory deficits EXTREMITIES: No lower extremity edema BREAST: No palpable masses or nodules in either right or left breasts. No palpable axillary supraclavicular or infraclavicular adenopathy no breast tenderness or nipple discharge. (exam performed in the presence of a chaperone)  LABORATORY DATA:  I have reviewed the data as listed   Chemistry      Component Value Date/Time   NA 140 01/12/2016 1335   K 4.4 01/12/2016 1335   CL 107 10/24/2015 0431   CO2 25 01/12/2016 1335   BUN 13.1 01/12/2016 1335   CREATININE 0.8 01/12/2016 1335      Component Value Date/Time   CALCIUM 9.3 01/12/2016 1335   ALKPHOS 70 01/12/2016 1335   AST 13 01/12/2016 1335   ALT 10 01/12/2016 1335   BILITOT 0.31 01/12/2016 1335       Lab  Results  Component Value Date   WBC 3.1 (L) 01/12/2016   HGB 11.0 (L) 01/12/2016   HCT 32.8 (L) 01/12/2016   MCV 97.0 01/12/2016   PLT 218 01/12/2016   NEUTROABS 1.9 01/12/2016     ASSESSMENT & PLAN:  Breast cancer of upper-outer quadrant of right female breast ([place]) 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 Followed by 12 cycles of weekly Abraxane completed 07/19/2014 S/P Mastectomy 09/22/14: Bilateral mastectomies: Right: IDC Grade 2, 2.7cm, LVI, PNI, 6/8 LN Positive, Er 90%, PR 5% T2N2 (Stage IIIA) Nonhealing right breast wound: finally healed Adjuvant radiation therapy started 03/22/2015 to complete 05/11/2015  Treatment Summary: antiestrogen therapy with anastrozole 1 mg daily Jan 2017- April 2017 (stopped when she got diagnosed with metastatic disease) Anastrozole toxicities: Hot flashes for which she is prescribed gabapentin 300 twice a day  PET/CT scan 08/08/2015: Multifocal areas of increased hypermetabolic is in new/progressive but without definite underlying lesion on CT scan suspicious for metastases, T5, T7, left sacrum, left iliac bone; 13 mm gastrohepatic node SUV 5.2 Suspicious for nodal metastases. ----------------------------------------------------------------------------------------------------------------------------------------------------------------- Met diseaseTreatment plan:Ibrance with Faslodex. Started 08/12/2015 ; today cycle 6 Ibrance toxicities:  1. Mild fatigue 2. Mild nausea Patient got a grant to pay for [employer]. Blood work today was reviewed [place] is 2200.   Bone metastases XGeva once every 3 months (started 01/12/2016)  Pulmonary emboli: Currently on xarelto CT of her chest abdomen pelvis with contrast 11/07/2015: Partial improvement of pulmonary embolism, mild progression of  patchy groundglass opacity right upper lobe probably inflammatory due to pulmonary embolism, stable bone metastases, no evidence of extraosseous metastases.  Return to clinic in 3 month for blood work, CT scans and follow-up.    Orders Placed This Encounter  Procedures  . CT Abdomen Pelvis Wo Contrast    Standing Status:   Future    Standing Expiration Date:   01/11/2017    Order Specific Question:   Reason for Exam (SYMPTOM  OR DIAGNOSIS REQUIRED)    Answer:   Metastatic breast cancer    Order Specific Question:   Preferred imaging location?    Answer:   [facility]  . CT Chest W Contrast    Standing Status:   Future    Standing Expiration Date:   01/11/2017    Order Specific Question:   If indicated for the ordered  procedure, I authorize the administration of contrast media per Radiology protocol    Answer:   Yes    Order Specific Question:   Reason for Exam (SYMPTOM  OR DIAGNOSIS REQUIRED)    Answer:   Metastatic breast cancer on treatment    Order Specific Question:   Preferred imaging location?    Answer:   [facility]   The patient has a good understanding of the overall plan. she agrees with it. she will call with any problems that may develop before the next visit here.   Rulon Eisenmenger, MD 01/12/16

## 2016-01-12 NOTE — Patient Instructions (Signed)
Fulvestrant injection What is this medicine? FULVESTRANT (ful VES trant) blocks the effects of estrogen. It is used to treat breast cancer. This medicine may be used for other purposes; ask your health care provider or pharmacist if you have questions. What should I tell my health care provider before I take this medicine? They need to know if you have any of these conditions: -bleeding problems -liver disease -low levels of platelets in the blood -an unusual or allergic reaction to fulvestrant, other medicines, foods, dyes, or preservatives -pregnant or trying to get pregnant -breast-feeding How should I use this medicine? This medicine is for injection into a muscle. It is usually given by a health care professional in a hospital or clinic setting. Talk to your pediatrician regarding the use of this medicine in children. Special care may be needed. Overdosage: If you think you have taken too much of this medicine contact a poison control center or emergency room at once. NOTE: This medicine is only for you. Do not share this medicine with others. What if I miss a dose? It is important not to miss your dose. Call your doctor or health care professional if you are unable to keep an appointment. What may interact with this medicine? -medicines that treat or prevent blood clots like warfarin, enoxaparin, and dalteparin This list may not describe all possible interactions. Give your health care provider a list of all the medicines, herbs, non-prescription drugs, or dietary supplements you use. Also tell them if you smoke, drink alcohol, or use illegal drugs. Some items may interact with your medicine. What should I watch for while using this medicine? Your condition will be monitored carefully while you are receiving this medicine. You will need important blood work done while you are taking this medicine. Do not become pregnant while taking this medicine or for at least 1 year after stopping  it. Women of child-bearing potential will need to have a negative pregnancy test before starting this medicine. Women should inform their doctor if they wish to become pregnant or think they might be pregnant. There is a potential for serious side effects to an unborn child. Men should inform their doctors if they wish to father a child. This medicine may lower sperm counts. Talk to your health care professional or pharmacist for more information. Do not breast-feed an infant while taking this medicine or for 1 year after the last dose. What side effects may I notice from receiving this medicine? Side effects that you should report to your doctor or health care professional as soon as possible: -allergic reactions like skin rash, itching or hives, swelling of the face, lips, or tongue -feeling faint or lightheaded, falls -pain, tingling, numbness, or weakness in the legs -signs and symptoms of infection like fever or chills; cough; flu-like symptoms; sore throat -vaginal bleeding Side effects that usually do not require medical attention (report to your doctor or health care professional if they continue or are bothersome): -aches, pains -constipation -diarrhea -headache -hot flashes -nausea, vomiting -pain at site where injected -stomach pain This list may not describe all possible side effects. Call your doctor for medical advice about side effects. You may report side effects to FDA at 1-800-FDA-1088. Where should I keep my medicine? This drug is given in a hospital or clinic and will not be stored at home. NOTE: This sheet is a summary. It may not cover all possible information. If you have questions about this medicine, talk to your doctor, pharmacist, or health   care provider.    2016, Elsevier/Gold Standard. (2014-11-19 11:03:55)  Denosumab injection What is this medicine? DENOSUMAB (den oh sue mab) slows bone breakdown. Prolia is used to treat osteoporosis in women after menopause  and in men. Xgeva is used to prevent bone fractures and other bone problems caused by cancer bone metastases. Xgeva is also used to treat giant cell tumor of the bone. This medicine may be used for other purposes; ask your health care provider or pharmacist if you have questions. What should I tell my health care provider before I take this medicine? They need to know if you have any of these conditions: -dental disease -eczema -infection or history of infections -kidney disease or on dialysis -low blood calcium or vitamin D -malabsorption syndrome -scheduled to have surgery or tooth extraction -taking medicine that contains denosumab -thyroid or parathyroid disease -an unusual reaction to denosumab, other medicines, foods, dyes, or preservatives -pregnant or trying to get pregnant -breast-feeding How should I use this medicine? This medicine is for injection under the skin. It is given by a health care professional in a hospital or clinic setting. If you are getting Prolia, a special MedGuide will be given to you by the pharmacist with each prescription and refill. Be sure to read this information carefully each time. For Prolia, talk to your pediatrician regarding the use of this medicine in children. Special care may be needed. For Xgeva, talk to your pediatrician regarding the use of this medicine in children. While this drug may be prescribed for children as young as 13 years for selected conditions, precautions do apply. Overdosage: If you think you have taken too much of this medicine contact a poison control center or emergency room at once. NOTE: This medicine is only for you. Do not share this medicine with others. What if I miss a dose? It is important not to miss your dose. Call your doctor or health care professional if you are unable to keep an appointment. What may interact with this medicine? Do not take this medicine with any of the following medications: -other medicines  containing denosumab This medicine may also interact with the following medications: -medicines that suppress the immune system -medicines that treat cancer -steroid medicines like prednisone or cortisone This list may not describe all possible interactions. Give your health care provider a list of all the medicines, herbs, non-prescription drugs, or dietary supplements you use. Also tell them if you smoke, drink alcohol, or use illegal drugs. Some items may interact with your medicine. What should I watch for while using this medicine? Visit your doctor or health care professional for regular checks on your progress. Your doctor or health care professional may order blood tests and other tests to see how you are doing. Call your doctor or health care professional if you get a cold or other infection while receiving this medicine. Do not treat yourself. This medicine may decrease your body's ability to fight infection. You should make sure you get enough calcium and vitamin D while you are taking this medicine, unless your doctor tells you not to. Discuss the foods you eat and the vitamins you take with your health care professional. See your dentist regularly. Brush and floss your teeth as directed. Before you have any dental work done, tell your dentist you are receiving this medicine. Do not become pregnant while taking this medicine or for 5 months after stopping it. Women should inform their doctor if they wish to become pregnant or think   they might be pregnant. There is a potential for serious side effects to an unborn child. Talk to your health care professional or pharmacist for more information. What side effects may I notice from receiving this medicine? Side effects that you should report to your doctor or health care professional as soon as possible: -allergic reactions like skin rash, itching or hives, swelling of the face, lips, or tongue -breathing problems -chest pain -fast,  irregular heartbeat -feeling faint or lightheaded, falls -fever, chills, or any other sign of infection -muscle spasms, tightening, or twitches -numbness or tingling -skin blisters or bumps, or is dry, peels, or red -slow healing or unexplained pain in the mouth or jaw -unusual bleeding or bruising Side effects that usually do not require medical attention (Report these to your doctor or health care professional if they continue or are bothersome.): -muscle pain -stomach upset, gas This list may not describe all possible side effects. Call your doctor for medical advice about side effects. You may report side effects to FDA at 1-800-FDA-1088. Where should I keep my medicine? This medicine is only given in a clinic, doctor's office, or other health care setting and will not be stored at home. NOTE: This sheet is a summary. It may not cover all possible information. If you have questions about this medicine, talk to your doctor, pharmacist, or health care provider.    2016, Elsevier/Gold Standard. (2011-10-22 12:37:47)   

## 2016-01-12 NOTE — Assessment & Plan Note (Signed)
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 Followed by 12 cycles of weekly Abraxane completed 07/19/2014 S/P Mastectomy 09/22/14: Bilateral mastectomies: Right: IDC Grade 2, 2.7cm, LVI, PNI, 6/8 LN Positive, Er 90%, PR 5% T2N2 (Stage IIIA) Nonhealing right breast wound: finally healed Adjuvant radiation therapy started 03/22/2015 to complete 05/11/2015  Treatment Summary: antiestrogen therapy with anastrozole 1 mg daily Jan 2017- April 2017 (stopped when she got diagnosed with metastatic disease) Anastrozole toxicities: Hot flashes for which she is prescribed gabapentin 300 twice a day  PET/CT scan 08/08/2015: Multifocal areas of increased hypermetabolic is in new/progressive but without definite underlying lesion on CT scan suspicious for metastases, T5, T7, left sacrum, left iliac bone; 13 mm gastrohepatic node SUV 5.2 Suspicious for nodal metastases. ----------------------------------------------------------------------------------------------------------------------------------------------------------------- Met diseaseTreatment plan:Ibrance with Faslodex. Started 08/12/2015 ; today cycle 6 Ibrance toxicities:  1. Mild fatigue 2. Mild nausea Patient got a grant to pay for [employer]. Blood work today was reviewed [place] is 2200.   Bone metastases XGeva once every 3 months (to start when she returns in September)  Pulmonary emboli: Currently on xarelto CT of her chest abdomen pelvis with contrast 11/07/2015: Partial improvement of pulmonary embolism, mild progression of patchy groundglass opacity right upper lobe probably inflammatory due to pulmonary embolism, stable bone metastases, no evidence of extraosseous metastases.  Return to clinic in 2 month for blood work and follow-up.

## 2016-01-12 NOTE — Telephone Encounter (Signed)
appt made and avs printed °

## 2016-01-13 ENCOUNTER — Telehealth: Payer: Self-pay | Admitting: Pharmacist

## 2016-01-13 NOTE — Telephone Encounter (Signed)
Pt unable to bring/leave financial documents to 01/12/16 MD office visit.  Called pt and left VM to set up another time to get copy of documents and her signature for [employer] patient assistance application. Asked her to call [phone] with any concerns/questions.  Raul Del, PharmD, BCPS, [facility] [phone]

## 2016-01-31 ENCOUNTER — Encounter: Payer: Self-pay | Admitting: Pharmacist

## 2016-01-31 NOTE — Progress Notes (Signed)
Oral Chemotherapy Pharmacist Encounter   Received notification for [employer] patient assistance that patient had been successfully enrolled in their assistance program through 05/06/16 for Ibrance. Subject: [id number]  Johny Drilling, PharmD, BCPS 01/31/2016  11:43 AM [facility] [phone]

## 2016-02-09 ENCOUNTER — Ambulatory Visit (HOSPITAL_BASED_OUTPATIENT_CLINIC_OR_DEPARTMENT_OTHER): Payer: Medicare Other

## 2016-02-09 ENCOUNTER — Other Ambulatory Visit: Payer: Medicare Other

## 2016-02-09 ENCOUNTER — Ambulatory Visit: Payer: Medicare Other

## 2016-02-09 ENCOUNTER — Other Ambulatory Visit (HOSPITAL_BASED_OUTPATIENT_CLINIC_OR_DEPARTMENT_OTHER): Payer: Medicare Other

## 2016-02-09 VITALS — BP 135/70 | HR 66 | Temp 98.0°F | Resp 18

## 2016-02-09 DIAGNOSIS — C7951 Secondary malignant neoplasm of bone: Secondary | ICD-10-CM

## 2016-02-09 DIAGNOSIS — C50411 Malignant neoplasm of upper-outer quadrant of right female breast: Secondary | ICD-10-CM

## 2016-02-09 DIAGNOSIS — Z452 Encounter for adjustment and management of vascular access device: Secondary | ICD-10-CM

## 2016-02-09 DIAGNOSIS — Z95828 Presence of other vascular implants and grafts: Secondary | ICD-10-CM

## 2016-02-09 DIAGNOSIS — Z5111 Encounter for antineoplastic chemotherapy: Secondary | ICD-10-CM

## 2016-02-09 LAB — COMPREHENSIVE METABOLIC PANEL
ALK PHOS: 77 U/L (ref 40–150)
ALT: 10 U/L (ref 0–55)
AST: 11 U/L (ref 5–34)
Albumin: 3.4 g/dL — ABNORMAL LOW (ref 3.5–5.0)
Anion Gap: 9 mEq/L (ref 3–11)
BUN: 16.2 mg/dL (ref 7.0–26.0)
CO2: 25 meq/L (ref 22–29)
Calcium: 9.6 mg/dL (ref 8.4–10.4)
Chloride: 108 mEq/L (ref 98–109)
Creatinine: 0.8 mg/dL (ref 0.6–1.1)
EGFR: 79 mL/min/{1.73_m2} — ABNORMAL LOW (ref >90)
GLUCOSE: 136 mg/dL (ref 70–140)
POTASSIUM: 4.2 meq/L (ref 3.5–5.1)
Sodium: 142 mEq/L (ref 136–145)
Total Bilirubin: <0.3 mg/dL (ref 0.20–1.20)
Total Protein: 6.5 g/dL (ref 6.4–8.3)

## 2016-02-09 LAB — CBC WITH DIFFERENTIAL/PLATELET
BASO%: 2.7 % — AB (ref 0.0–2.0)
BASOS ABS: 0.1 10*3/uL (ref 0.0–0.1)
EOS%: 2.2 % (ref 0.0–7.0)
Eosinophils Absolute: 0.1 10*3/uL (ref 0.0–0.5)
HCT: 34.5 % — ABNORMAL LOW (ref 34.8–46.6)
HGB: 11.5 g/dL — ABNORMAL LOW (ref 11.6–15.9)
LYMPH%: 22.3 % (ref 14.0–49.7)
MCH: 33 pg (ref 25.1–34.0)
MCHC: 33.3 g/dL (ref 31.5–36.0)
MCV: 99.1 fL (ref 79.5–101.0)
MONO#: 0.3 10*3/uL (ref 0.1–0.9)
MONO%: 6.6 % (ref 0.0–14.0)
NEUT#: 2.5 10*3/uL (ref 1.5–6.5)
NEUT%: 66.2 % (ref 38.4–76.8)
Platelets: 266 10*3/uL (ref 145–400)
RBC: 3.48 10*6/uL — AB (ref 3.70–5.45)
RDW: 16.4 % — ABNORMAL HIGH (ref 11.2–14.5)
WBC: 3.8 10*3/uL — ABNORMAL LOW (ref 3.9–10.3)
lymph#: 0.8 10*3/uL — ABNORMAL LOW (ref 0.9–3.3)

## 2016-02-09 MED ORDER — HEPARIN SOD (PORK) LOCK FLUSH 100 UNIT/ML IV SOLN
500.0000 [IU] | Freq: Once | INTRAVENOUS | Status: AC | PRN
  Administered 2016-02-09: 500 [IU] via INTRAVENOUS
  Filled 2016-02-09: qty 5

## 2016-02-09 MED ORDER — FULVESTRANT 250 MG/5ML IM SOLN
500.0000 mg | INTRAMUSCULAR | Status: DC
  Administered 2016-02-09: 500 mg via INTRAMUSCULAR
  Filled 2016-02-09: qty 10

## 2016-02-09 MED ORDER — SODIUM CHLORIDE 0.9 % IJ SOLN
10.0000 mL | INTRAMUSCULAR | Status: DC | PRN
  Administered 2016-02-09: 10 mL via INTRAVENOUS
  Filled 2016-02-09: qty 10

## 2016-02-13 ENCOUNTER — Other Ambulatory Visit: Payer: Self-pay | Admitting: *Deleted

## 2016-02-13 DIAGNOSIS — L7634 Postprocedural seroma of skin and subcutaneous tissue following other procedure: Secondary | ICD-10-CM | POA: Diagnosis not present

## 2016-02-13 MED ORDER — RIVAROXABAN 20 MG PO TABS: 20.0000 mg | ORAL_TABLET | Freq: Every day | ORAL | 0 refills | Status: DC

## 2016-03-08 ENCOUNTER — Ambulatory Visit (HOSPITAL_BASED_OUTPATIENT_CLINIC_OR_DEPARTMENT_OTHER): Payer: Medicare Other

## 2016-03-08 ENCOUNTER — Ambulatory Visit: Payer: Medicare Other

## 2016-03-08 ENCOUNTER — Other Ambulatory Visit: Payer: Medicare Other

## 2016-03-08 ENCOUNTER — Other Ambulatory Visit (HOSPITAL_BASED_OUTPATIENT_CLINIC_OR_DEPARTMENT_OTHER): Payer: Medicare Other

## 2016-03-08 VITALS — BP 96/54 | HR 74 | Temp 97.8°F | Resp 18

## 2016-03-08 DIAGNOSIS — C7951 Secondary malignant neoplasm of bone: Secondary | ICD-10-CM

## 2016-03-08 DIAGNOSIS — Z5111 Encounter for antineoplastic chemotherapy: Secondary | ICD-10-CM

## 2016-03-08 DIAGNOSIS — Z452 Encounter for adjustment and management of vascular access device: Secondary | ICD-10-CM

## 2016-03-08 DIAGNOSIS — C50411 Malignant neoplasm of upper-outer quadrant of right female breast: Secondary | ICD-10-CM

## 2016-03-08 DIAGNOSIS — M1711 Unilateral primary osteoarthritis, right knee: Secondary | ICD-10-CM | POA: Diagnosis not present

## 2016-03-08 DIAGNOSIS — Z95828 Presence of other vascular implants and grafts: Secondary | ICD-10-CM

## 2016-03-08 LAB — CBC WITH DIFFERENTIAL/PLATELET
BASO%: 2.9 % — AB (ref 0.0–2.0)
Basophils Absolute: 0.1 10*3/uL (ref 0.0–0.1)
EOS%: 1.4 % (ref 0.0–7.0)
Eosinophils Absolute: 0.1 10*3/uL (ref 0.0–0.5)
HCT: 33.7 % — ABNORMAL LOW (ref 34.8–46.6)
HGB: 11.4 g/dL — ABNORMAL LOW (ref 11.6–15.9)
LYMPH%: 22.9 % (ref 14.0–49.7)
MCH: 33 pg (ref 25.1–34.0)
MCHC: 33.8 g/dL (ref 31.5–36.0)
MCV: 97.7 fL (ref 79.5–101.0)
MONO#: 0.2 10*3/uL (ref 0.1–0.9)
MONO%: 5.3 % (ref 0.0–14.0)
NEUT%: 67.5 % (ref 38.4–76.8)
NEUTROS ABS: 2.8 10*3/uL (ref 1.5–6.5)
Platelets: 253 10*3/uL (ref 145–400)
RBC: 3.45 10*6/uL — AB (ref 3.70–5.45)
RDW: 15.4 % — ABNORMAL HIGH (ref 11.2–14.5)
WBC: 4.2 10*3/uL (ref 3.9–10.3)
lymph#: 1 10*3/uL (ref 0.9–3.3)

## 2016-03-08 LAB — COMPREHENSIVE METABOLIC PANEL
ALT: 16 U/L (ref 0–55)
ANION GAP: 8 meq/L (ref 3–11)
AST: 17 U/L (ref 5–34)
Albumin: 3.5 g/dL (ref 3.5–5.0)
Alkaline Phosphatase: 75 U/L (ref 40–150)
BILIRUBIN TOTAL: 0.28 mg/dL (ref 0.20–1.20)
BUN: 20.5 mg/dL (ref 7.0–26.0)
CHLORIDE: 107 meq/L (ref 98–109)
CO2: 25 meq/L (ref 22–29)
CREATININE: 0.8 mg/dL (ref 0.6–1.1)
Calcium: 9.5 mg/dL (ref 8.4–10.4)
EGFR: 76 mL/min/{1.73_m2} — AB (ref >90)
GLUCOSE: 102 mg/dL (ref 70–140)
Potassium: 4.5 mEq/L (ref 3.5–5.1)
SODIUM: 140 meq/L (ref 136–145)
TOTAL PROTEIN: 6.6 g/dL (ref 6.4–8.3)

## 2016-03-08 MED ORDER — FULVESTRANT 250 MG/5ML IM SOLN
500.0000 mg | INTRAMUSCULAR | Status: DC
  Administered 2016-03-08: 250 mg via INTRAMUSCULAR
  Filled 2016-03-08: qty 10

## 2016-03-08 MED ORDER — HEPARIN SOD (PORK) LOCK FLUSH 100 UNIT/ML IV SOLN
500.0000 [IU] | Freq: Once | INTRAVENOUS | Status: AC | PRN
  Administered 2016-03-08: 500 [IU] via INTRAVENOUS
  Filled 2016-03-08: qty 5

## 2016-03-08 MED ORDER — SODIUM CHLORIDE 0.9 % IJ SOLN
10.0000 mL | INTRAMUSCULAR | Status: DC | PRN
  Administered 2016-03-08: 10 mL via INTRAVENOUS
  Filled 2016-03-08: qty 10

## 2016-03-13 ENCOUNTER — Telehealth: Payer: Self-pay | Admitting: Pharmacist

## 2016-03-13 NOTE — Telephone Encounter (Signed)
Oral Chemotherapy Pharmacist Encounter  I spoke with patient today about application for [employer] patient assistance for [place]. Patient has been receiving her Ibrance through [employer] and it is time to re-apply for assistance for 2018.  Patient will bring application that [place] sent to her as well as proof of income for 2016 to [facility]. She will ensure to sign all places on the application where patient signature is required. [facility] will fill out the rest of the application and send it to [place] once it is received from patient.  Patient understands and is in agreement with this plan.  [facility] will continue to follow.  Johny Drilling, PharmD, BCPS 03/13/2016  10:13 AM [facility] [phone]

## 2016-03-15 DIAGNOSIS — M1711 Unilateral primary osteoarthritis, right knee: Secondary | ICD-10-CM | POA: Diagnosis not present

## 2016-03-19 ENCOUNTER — Other Ambulatory Visit: Payer: Self-pay | Admitting: Hematology and Oncology

## 2016-03-20 ENCOUNTER — Other Ambulatory Visit: Payer: Self-pay

## 2016-03-22 DIAGNOSIS — M1711 Unilateral primary osteoarthritis, right knee: Secondary | ICD-10-CM | POA: Diagnosis not present

## 2016-03-28 ENCOUNTER — Telehealth: Payer: Self-pay | Admitting: Pharmacist

## 2016-03-28 NOTE — Telephone Encounter (Signed)
Completed [employer] Patient Assistance Re-enrollment Form. Faxed to [phone].  Faxed form, copy of insurance card and proof of income documentation.  Fax confirmation received.  Raul Del, PharmD, BCPS, BCOP [facility] [phone]

## 2016-04-02 ENCOUNTER — Telehealth: Payer: Self-pay

## 2016-04-02 NOTE — Telephone Encounter (Signed)
Pt called stating she was scheduled for CT chest. All prior had been CAP. Per Dr Lindi Adie note of it should be CAP. Called radiology scheduling and it is CAP, the orders are linked. Pt made aware.

## 2016-04-07 ENCOUNTER — Other Ambulatory Visit: Payer: Self-pay | Admitting: Hematology and Oncology

## 2016-04-09 ENCOUNTER — Ambulatory Visit (HOSPITAL_COMMUNITY)
Admission: RE | Admit: 2016-04-09 | Discharge: 2016-04-09 | Disposition: A | Discharge status: Home / Self Care | Payer: Medicare Other | Source: Ambulatory Visit | Attending: Hematology and Oncology | Admitting: Hematology and Oncology

## 2016-04-09 ENCOUNTER — Encounter (HOSPITAL_COMMUNITY): Payer: Self-pay

## 2016-04-09 ENCOUNTER — Other Ambulatory Visit: Payer: Self-pay

## 2016-04-09 DIAGNOSIS — I700 Atherosclerosis of aorta: Secondary | ICD-10-CM | POA: Insufficient documentation

## 2016-04-09 DIAGNOSIS — D3501 Benign neoplasm of right adrenal gland: Secondary | ICD-10-CM | POA: Insufficient documentation

## 2016-04-09 DIAGNOSIS — C7951 Secondary malignant neoplasm of bone: Secondary | ICD-10-CM

## 2016-04-09 DIAGNOSIS — C50411 Malignant neoplasm of upper-outer quadrant of right female breast: Secondary | ICD-10-CM

## 2016-04-09 DIAGNOSIS — R918 Other nonspecific abnormal finding of lung field: Secondary | ICD-10-CM | POA: Diagnosis not present

## 2016-04-09 DIAGNOSIS — C50911 Malignant neoplasm of unspecified site of right female breast: Secondary | ICD-10-CM | POA: Diagnosis not present

## 2016-04-09 MED ORDER — IOPAMIDOL (ISOVUE-300) INJECTION 61%
100.0000 mL | Freq: Once | INTRAVENOUS | Status: AC | PRN
  Administered 2016-04-09: 100 mL via INTRAVENOUS

## 2016-04-11 DIAGNOSIS — L7634 Postprocedural seroma of skin and subcutaneous tissue following other procedure: Secondary | ICD-10-CM | POA: Diagnosis not present

## 2016-04-11 NOTE — Assessment & Plan Note (Addendum)
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 Followed by 12 cycles of weekly Abraxane completed 07/19/2014 S/P Mastectomy 09/22/14: Bilateral mastectomies: Right: IDC Grade 2, 2.7cm, LVI, PNI, 6/8 LN Positive, Er 90%, PR 5% T2N2 (Stage IIIA) Nonhealing right breast wound: finally healed Adjuvant radiation therapy started 03/22/2015 to complete 05/11/2015  Treatment Summary: antiestrogen therapy with anastrozole 1 mg daily Jan 2017- April 2017 (stopped when she got diagnosed with metastatic disease) Anastrozole toxicities: Hot flashes for which she is prescribed gabapentin 300 twice a day  PET/CT scan 08/08/2015: Multifocal areas of increased hypermetabolic is in new/progressive but without definite underlying lesion on CT scan suspicious for metastases, T5, T7, left sacrum, left iliac bone; 13 mm gastrohepatic node SUV 5.2 Suspicious for nodal metastases. ----------------------------------------------------------------------------------------------------------------------------------------------------------------- Met diseaseTreatment plan:Ibrance with Faslodex. Started 08/12/2015 ; today cycle 8 Ibrance toxicities:  1. Mild fatigue 2. Mild nausea Patient got a grant to pay for [employer]. Blood work today was reviewed [place] is 2200.   Bone metastases XGeva once every 3 months (started 01/12/2016)  Pulmonary emboli: Currently on xarelto CT of her chest abdomen pelvis with contrast 11/07/2015: Partial improvement of pulmonary embolism, mild progression of patchy groundglass opacity right upper lobe probably inflammatory due to pulmonary embolism, stable bone metastases, no evidence of extraosseous metastases. CT CAP 04/10/16: stable multifocal osseus met disease, small non specific pulm nodules stable Plan Next scans in 6 months  RTC  in 3 months for follow up Return to clinic in 3 month for blood work, CT scans and follow-up

## 2016-04-12 ENCOUNTER — Other Ambulatory Visit (HOSPITAL_BASED_OUTPATIENT_CLINIC_OR_DEPARTMENT_OTHER): Payer: Medicare Other

## 2016-04-12 ENCOUNTER — Ambulatory Visit (HOSPITAL_BASED_OUTPATIENT_CLINIC_OR_DEPARTMENT_OTHER): Payer: Medicare Other

## 2016-04-12 ENCOUNTER — Ambulatory Visit (HOSPITAL_BASED_OUTPATIENT_CLINIC_OR_DEPARTMENT_OTHER): Payer: Medicare Other | Admitting: Hematology and Oncology

## 2016-04-12 ENCOUNTER — Encounter: Payer: Self-pay | Admitting: Hematology and Oncology

## 2016-04-12 VITALS — BP 142/59 | HR 74 | Temp 98.8°F | Resp 20

## 2016-04-12 DIAGNOSIS — C7951 Secondary malignant neoplasm of bone: Secondary | ICD-10-CM

## 2016-04-12 DIAGNOSIS — I2699 Other pulmonary embolism without acute cor pulmonale: Secondary | ICD-10-CM

## 2016-04-12 DIAGNOSIS — Z5111 Encounter for antineoplastic chemotherapy: Secondary | ICD-10-CM | POA: Diagnosis not present

## 2016-04-12 DIAGNOSIS — Z95828 Presence of other vascular implants and grafts: Secondary | ICD-10-CM

## 2016-04-12 DIAGNOSIS — C50411 Malignant neoplasm of upper-outer quadrant of right female breast: Secondary | ICD-10-CM

## 2016-04-12 DIAGNOSIS — C773 Secondary and unspecified malignant neoplasm of axilla and upper limb lymph nodes: Secondary | ICD-10-CM

## 2016-04-12 DIAGNOSIS — Z170 Estrogen receptor positive status [ER+]: Secondary | ICD-10-CM | POA: Diagnosis not present

## 2016-04-12 DIAGNOSIS — Z452 Encounter for adjustment and management of vascular access device: Secondary | ICD-10-CM | POA: Diagnosis not present

## 2016-04-12 LAB — CBC WITH DIFFERENTIAL/PLATELET
BASO%: 3.3 % — AB (ref 0.0–2.0)
Basophils Absolute: 0.1 10*3/uL (ref 0.0–0.1)
EOS ABS: 0.1 10*3/uL (ref 0.0–0.5)
EOS%: 2.2 % (ref 0.0–7.0)
HCT: 32.9 % — ABNORMAL LOW (ref 34.8–46.6)
HEMOGLOBIN: 11 g/dL — AB (ref 11.6–15.9)
LYMPH%: 26.8 % (ref 14.0–49.7)
MCH: 33.2 pg (ref 25.1–34.0)
MCHC: 33.4 g/dL (ref 31.5–36.0)
MCV: 99.5 fL (ref 79.5–101.0)
MONO#: 0.2 10*3/uL (ref 0.1–0.9)
MONO%: 6.9 % (ref 0.0–14.0)
NEUT%: 60.8 % (ref 38.4–76.8)
NEUTROS ABS: 1.6 10*3/uL (ref 1.5–6.5)
PLATELETS: 303 10*3/uL (ref 145–400)
RBC: 3.31 10*6/uL — AB (ref 3.70–5.45)
RDW: 15.7 % — AB (ref 11.2–14.5)
WBC: 2.7 10*3/uL — AB (ref 3.9–10.3)
lymph#: 0.7 10*3/uL — ABNORMAL LOW (ref 0.9–3.3)

## 2016-04-12 LAB — COMPREHENSIVE METABOLIC PANEL
ALBUMIN: 3.4 g/dL — AB (ref 3.5–5.0)
ALK PHOS: 74 U/L (ref 40–150)
ALT: 15 U/L (ref 0–55)
AST: 16 U/L (ref 5–34)
Anion Gap: 10 mEq/L (ref 3–11)
BILIRUBIN TOTAL: 0.41 mg/dL (ref 0.20–1.20)
BUN: 14.7 mg/dL (ref 7.0–26.0)
CO2: 25 mEq/L (ref 22–29)
CREATININE: 0.8 mg/dL (ref 0.6–1.1)
Calcium: 9.6 mg/dL (ref 8.4–10.4)
Chloride: 105 mEq/L (ref 98–109)
EGFR: 77 mL/min/{1.73_m2} — ABNORMAL LOW (ref >90)
GLUCOSE: 201 mg/dL — AB (ref 70–140)
Potassium: 4.3 mEq/L (ref 3.5–5.1)
Sodium: 140 mEq/L (ref 136–145)
TOTAL PROTEIN: 6.6 g/dL (ref 6.4–8.3)

## 2016-04-12 MED ORDER — SODIUM CHLORIDE 0.9 % IJ SOLN
10.0000 mL | INTRAMUSCULAR | Status: DC | PRN
  Administered 2016-04-12: 10 mL via INTRAVENOUS
  Filled 2016-04-12: qty 10

## 2016-04-12 MED ORDER — DENOSUMAB 120 MG/1.7ML ~~LOC~~ SOLN
120.0000 mg | Freq: Once | SUBCUTANEOUS | Status: AC
  Administered 2016-04-12: 120 mg via SUBCUTANEOUS
  Filled 2016-04-12: qty 1.7

## 2016-04-12 MED ORDER — HEPARIN SOD (PORK) LOCK FLUSH 100 UNIT/ML IV SOLN
500.0000 [IU] | Freq: Once | INTRAVENOUS | Status: AC | PRN
  Administered 2016-04-12: 500 [IU] via INTRAVENOUS
  Filled 2016-04-12: qty 5

## 2016-04-12 MED ORDER — FULVESTRANT 250 MG/5ML IM SOLN
500.0000 mg | Freq: Once | INTRAMUSCULAR | Status: AC
  Administered 2016-04-12: 500 mg via INTRAMUSCULAR
  Filled 2016-04-12: qty 10

## 2016-04-12 NOTE — Patient Instructions (Signed)
Fulvestrant injection What is this medicine? FULVESTRANT (ful VES trant) blocks the effects of estrogen. It is used to treat breast cancer. This medicine may be used for other purposes; ask your health care provider or pharmacist if you have questions. COMMON BRAND NAME(S): FASLODEX What should I tell my health care provider before I take this medicine? They need to know if you have any of these conditions: -bleeding problems -liver disease -low levels of platelets in the blood -an unusual or allergic reaction to fulvestrant, other medicines, foods, dyes, or preservatives -pregnant or trying to get pregnant -breast-feeding How should I use this medicine? This medicine is for injection into a muscle. It is usually given by a health care professional in a hospital or clinic setting. Talk to your pediatrician regarding the use of this medicine in children. Special care may be needed. Overdosage: If you think you have taken too much of this medicine contact a poison control center or emergency room at once. NOTE: This medicine is only for you. Do not share this medicine with others. What if I miss a dose? It is important not to miss your dose. Call your doctor or health care professional if you are unable to keep an appointment. What may interact with this medicine? -medicines that treat or prevent blood clots like warfarin, enoxaparin, and dalteparin This list may not describe all possible interactions. Give your health care provider a list of all the medicines, herbs, non-prescription drugs, or dietary supplements you use. Also tell them if you smoke, drink alcohol, or use illegal drugs. Some items may interact with your medicine. What should I watch for while using this medicine? Your condition will be monitored carefully while you are receiving this medicine. You will need important blood work done while you are taking this medicine. Do not become pregnant while taking this medicine or for  at least 1 year after stopping it. Women of child-bearing potential will need to have a negative pregnancy test before starting this medicine. Women should inform their doctor if they wish to become pregnant or think they might be pregnant. There is a potential for serious side effects to an unborn child. Men should inform their doctors if they wish to father a child. This medicine may lower sperm counts. Talk to your health care professional or pharmacist for more information. Do not breast-feed an infant while taking this medicine or for 1 year after the last dose. What side effects may I notice from receiving this medicine? Side effects that you should report to your doctor or health care professional as soon as possible: -allergic reactions like skin rash, itching or hives, swelling of the face, lips, or tongue -feeling faint or lightheaded, falls -pain, tingling, numbness, or weakness in the legs -signs and symptoms of infection like fever or chills; cough; flu-like symptoms; sore throat -vaginal bleeding Side effects that usually do not require medical attention (report to your doctor or health care professional if they continue or are bothersome): -aches, pains -constipation -diarrhea -headache -hot flashes -nausea, vomiting -pain at site where injected -stomach pain This list may not describe all possible side effects. Call your doctor for medical advice about side effects. You may report side effects to FDA at 1-800-FDA-1088. Where should I keep my medicine? This drug is given in a hospital or clinic and will not be stored at home. NOTE: This sheet is a summary. It may not cover all possible information. If you have questions about this medicine, talk to your   doctor, pharmacist, or health care provider.  2017 Elsevier/Gold Standard (2014-11-19 11:03:55) Denosumab injection What is this medicine? DENOSUMAB (den oh sue mab) slows bone breakdown. Prolia is used to treat osteoporosis in  women after menopause and in men. Xgeva is used to prevent bone fractures and other bone problems caused by cancer bone metastases. Xgeva is also used to treat giant cell tumor of the bone. COMMON BRAND NAME(S): Prolia, XGEVA What should I tell my health care provider before I take this medicine? They need to know if you have any of these conditions: -dental disease -eczema -infection or history of infections -kidney disease or on dialysis -low blood calcium or vitamin D -malabsorption syndrome -scheduled to have surgery or tooth extraction -taking medicine that contains denosumab -thyroid or parathyroid disease -an unusual reaction to denosumab, other medicines, foods, dyes, or preservatives -pregnant or trying to get pregnant -breast-feeding How should I use this medicine? This medicine is for injection under the skin. It is given by a health care professional in a hospital or clinic setting. If you are getting Prolia, a special MedGuide will be given to you by the pharmacist with each prescription and refill. Be sure to read this information carefully each time. For Prolia, talk to your pediatrician regarding the use of this medicine in children. Special care may be needed. For Xgeva, talk to your pediatrician regarding the use of this medicine in children. While this drug may be prescribed for children as young as 13 years for selected conditions, precautions do apply. What if I miss a dose? It is important not to miss your dose. Call your doctor or health care professional if you are unable to keep an appointment. What may interact with this medicine? Do not take this medicine with any of the following medications: -other medicines containing denosumab This medicine may also interact with the following medications: -medicines that suppress the immune system -medicines that treat cancer -steroid medicines like prednisone or cortisone What should I watch for while using this  medicine? Visit your doctor or health care professional for regular checks on your progress. Your doctor or health care professional may order blood tests and other tests to see how you are doing. Call your doctor or health care professional if you get a cold or other infection while receiving this medicine. Do not treat yourself. This medicine may decrease your body's ability to fight infection. You should make sure you get enough calcium and vitamin D while you are taking this medicine, unless your doctor tells you not to. Discuss the foods you eat and the vitamins you take with your health care professional. See your dentist regularly. Brush and floss your teeth as directed. Before you have any dental work done, tell your dentist you are receiving this medicine. Do not become pregnant while taking this medicine or for 5 months after stopping it. Women should inform their doctor if they wish to become pregnant or think they might be pregnant. There is a potential for serious side effects to an unborn child. Talk to your health care professional or pharmacist for more information. What side effects may I notice from receiving this medicine? Side effects that you should report to your doctor or health care professional as soon as possible: -allergic reactions like skin rash, itching or hives, swelling of the face, lips, or tongue -breathing problems -chest pain -fast, irregular heartbeat -feeling faint or lightheaded, falls -fever, chills, or any other sign of infection -muscle spasms, tightening, or twitches -numbness   or tingling -skin blisters or bumps, or is dry, peels, or red -slow healing or unexplained pain in the mouth or jaw -unusual bleeding or bruising Side effects that usually do not require medical attention (report to your doctor or health care professional if they continue or are bothersome): -muscle pain -stomach upset, gas Where should I keep my medicine? This medicine is only  given in a clinic, doctor's office, or other health care setting and will not be stored at home.  2017 Elsevier/Gold Standard (2015-05-26 10:06:55)  

## 2016-04-12 NOTE — Progress Notes (Signed)
Patient Care Team: Jacelyn Pi, MD as PCP - General (Endocrinology)  DIAGNOSIS:  Encounter Diagnosis  Name Primary?  . Malignant neoplasm of upper-outer quadrant of right breast in female, estrogen receptor positive ([place])     SUMMARY OF ONCOLOGIC HISTORY:   Breast cancer of upper-outer quadrant of right female breast ([place])   02/02/2014 Mammogram    Right breast: 5.8 cm irregular high-density solid mass suggestive of malignancy; left breast next millimeter internal mammary lymph node      02/04/2014 Initial Biopsy     2 biopsies were performed the right breast and axilla: Grade 2 invasive ductal carcinoma ER/PR positive HER-2 negative Ki-67 20% to 47%: Biopsy of right humerus metastatic breast cancer estrogen positive      02/17/2014 PET scan    Hypermetabolic right breast cancer and right axillary lymph nodes subpectoral lymph nodes indeterminate right middle lobe pulmonary nodule and hypermetabolic proximal right humerus lesion      03/05/2014 - 07/19/2014 Neo-Adjuvant Chemotherapy    Dose dense Adriamycin and Cytoxan x4 cycles followed by Abraxane weekly x12      07/26/2014 Breast MRI    Multifocal breast cancer decreased in size, retroareolar 13 mm now 11 mm, middle third 22 mm now 18 mm, 3.9 x 3.9 cm now 3.9 x 2.6 cm, multiple smaller nodules decreased in size, right x-ray lymph node 3.6 cm now 2 cm other lymph nodes are smaller      07/28/2014 - 08/12/2015 Anti-estrogen oral therapy    Anastrozole 1 mg daily      08/02/2014 PET scan    Interval improvement in the right breast mass and axillary/subpectoral lymph nodes and right humerus head. Several subpleural nodules are not visible, 3.4. cm adrenal adenoma      09/22/2014 Surgery    Bilateral mastectomy: Right: IDC Grade 2, 2.7cm, LVI, PNI, 6/8 LN Positive, Er 90%, PR 5% T2N2      03/22/2015 -  Radiation Therapy    adjuvant radiation therapy with Dr. Isidore Moos      08/10/2015 PET scan    Progression of disease with  several new bone metastases, T5, T7, left sacrum, left iliac bone, gastrohepatic lymph node      08/12/2015 -  Anti-estrogen oral therapy    Faslodex with [facility] every 3 months for bone metastases       CHIEF COMPLIANT: Follow-up on Faslodex with Ibrance  INTERVAL HISTORY: Tonya Alvarez is a 22 year with above-mentioned history metastatic breast cancer currently on Faslodex with Ibrance. She is tolerating the treatment fairly well. Recent scans show stable disease.  Denies any bone pain. Denies any arthralgias or myalgias. She does complain of fatigue.  REVIEW OF SYSTEMS:   Constitutional: Denies fevers, chills or abnormal weight loss Eyes: Denies blurriness of vision Ears, nose, mouth, throat, and face: Denies mucositis or sore throat Respiratory: Denies cough, dyspnea or wheezes Cardiovascular: Denies palpitation, chest discomfort Gastrointestinal:  Denies nausea, heartburn or change in bowel habits Skin: Denies abnormal skin rashes Lymphatics: Denies new lymphadenopathy or easy bruising Neurological:Denies numbness, tingling or new weaknesses Behavioral/Psych: Mood is stable, no new changes  Extremities: No lower extremity edema Breast:  denies any pain or lumps or nodules in either breasts All other systems were reviewed with the patient and are negative.  I have reviewed the past medical history, past surgical history, social history and family history with the patient and they are unchanged from previous note.  ALLERGIES:  is allergic to amoxicillin; hydrocodone; and lisinopril.  MEDICATIONS:  Current Outpatient Prescriptions  Medication Sig Dispense Refill  . calcium-vitamin D (OSCAL WITH D) 500-200 MG-UNIT per tablet Take 1 tablet by mouth daily.    . clindamycin (CLEOCIN) 300 MG capsule Take 900 mg by mouth See admin instructions. Take 3 capsules (900 mg) by mouth one hour prior to dental appointment - last appointment October 11, 2015    . fluticasone (FLONASE) 50  MCG/ACT nasal spray Place 1 spray into both nostrils daily as needed for allergies or rhinitis. (Patient taking differently: Place 1 spray into both nostrils at bedtime as needed for allergies or rhinitis. ) 16 g 0  . gabapentin (NEURONTIN) 300 MG capsule Take 300 mg by mouth 3 (three) times daily.     Marland Kitchen ibuprofen (ADVIL,MOTRIN) 600 MG tablet Take 600 mg by mouth every 12 (twelve) hours.    . insulin lispro protamine-lispro (HUMALOG 75/25 MIX) (75-25) 100 UNIT/ML SUSP injection Inject 10-60 Units into the skin See admin instructions. Inject 60 units subcutaneously with breakfast and supper, inject 10 mls after lunch if CBG >150    . lidocaine-prilocaine (EMLA) cream Apply 1 application topically as needed. (Patient taking differently: Apply 1 application topically as needed (prior to flushing of port (every 4-6 weeks)). ) 30 g 6  . losartan (COZAAR) 50 MG tablet Take 50 mg by mouth daily.     . metFORMIN (GLUCOPHAGE-XR) 500 MG 24 hr tablet Take 2 tablets (1,000 mg total) by mouth 2 (two) times daily.    . Multiple Vitamin (MULTIVITAMIN WITH MINERALS) TABS tablet Take 1 tablet by mouth at bedtime.    . palbociclib (IBRANCE) 125 MG capsule Take 1 capsule (125 mg total) by mouth daily with breakfast. Take whole with food. 21 capsule 3  . simvastatin (ZOCOR) 20 MG tablet Take 20 mg by mouth at bedtime.    Alveda Reasons 20 MG TABS tablet TAKE 1 TABLET BY MOUTH  DAILY WITH SUPPER 90 tablet 0   No current facility-administered medications for this visit.    Facility-Administered Medications Ordered in Other Visits  Medication Dose Route Frequency Provider Last Rate Last Dose  . fulvestrant (FASLODEX) injection 500 mg  500 mg Intramuscular Q30 days Nicholas Lose, MD   500 mg at 11/10/15 1551    PHYSICAL EXAMINATION: ECOG PERFORMANCE STATUS: 1 - Symptomatic but completely ambulatory  There were no vitals filed for this visit. There were no vitals filed for this visit.  GENERAL:alert, no distress and  comfortable SKIN: skin color, texture, turgor are normal, no rashes or significant lesions EYES: normal, Conjunctiva are pink and non-injected, sclera clear OROPHARYNX:no exudate, no erythema and lips, buccal mucosa, and tongue normal  NECK: supple, thyroid normal size, non-tender, without nodularity LYMPH:  no palpable lymphadenopathy in the cervical, axillary or inguinal LUNGS: clear to auscultation and percussion with normal breathing effort HEART: regular rate & rhythm and no murmurs and no lower extremity edema ABDOMEN:abdomen soft, non-tender and normal bowel sounds MUSCULOSKELETAL:no cyanosis of digits and no clubbing  NEURO: alert & oriented x 3 with fluent speech, no focal motor/sensory deficits EXTREMITIES: No lower extremity edema BREAST: No palpable masses or nodules in either right or left breasts. No palpable axillary supraclavicular or infraclavicular adenopathy no breast tenderness or nipple discharge. (exam performed in the presence of a chaperone)  LABORATORY DATA:  I have reviewed the data as listed   Chemistry      Component Value Date/Time   NA 140 03/08/2016 1515   K 4.5 03/08/2016 1515  CL 107 10/24/2015 0431   CO2 25 03/08/2016 1515   BUN 20.5 03/08/2016 1515   CREATININE 0.8 03/08/2016 1515      Component Value Date/Time   CALCIUM 9.5 03/08/2016 1515   ALKPHOS 75 03/08/2016 1515   AST 17 03/08/2016 1515   ALT 16 03/08/2016 1515   BILITOT 0.28 03/08/2016 1515       Lab Results  Component Value Date   WBC 4.2 03/08/2016   HGB 11.4 (L) 03/08/2016   HCT 33.7 (L) 03/08/2016   MCV 97.7 03/08/2016   PLT 253 03/08/2016   NEUTROABS 2.8 03/08/2016    ASSESSMENT & PLAN:  Breast cancer of upper-outer quadrant of right female breast ([place]) 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 Followed  by 12 cycles of weekly Abraxane completed 07/19/2014 S/P Mastectomy 09/22/14: Bilateral mastectomies: Right: IDC Grade 2, 2.7cm, LVI, PNI, 6/8 LN Positive, Er 90%, PR 5% T2N2 (Stage IIIA) Nonhealing right breast wound: finally healed Adjuvant radiation therapy started 03/22/2015 to complete 05/11/2015  Treatment Summary: antiestrogen therapy with anastrozole 1 mg daily Jan 2017- April 2017 (stopped when she got diagnosed with metastatic disease) Anastrozole toxicities: Hot flashes for which she is prescribed gabapentin 300 twice a day  PET/CT scan 08/08/2015: Multifocal areas of increased hypermetabolic is in new/progressive but without definite underlying lesion on CT scan suspicious for metastases, T5, T7, left sacrum, left iliac bone; 13 mm gastrohepatic node SUV 5.2 Suspicious for nodal metastases. ----------------------------------------------------------------------------------------------------------------------------------------------------------------- Met diseaseTreatment plan:Ibrance with Faslodex. Started 08/12/2015 ; today cycle 8 Ibrance toxicities:  1. Mild fatigue 2. Mild nausea Patient got a grant to pay for [employer]. Blood work today was reviewed Tonya Alvarez is 2200.   Bone metastases XGeva once every 3 months (started 01/12/2016)  Pulmonary emboli: Currently on xarelto CT of her chest abdomen pelvis with contrast 11/07/2015: Partial improvement of pulmonary embolism, mild progression of patchy groundglass opacity right upper lobe probably inflammatory due to pulmonary embolism, stable bone metastases, no evidence of extraosseous metastases. CT CAP 04/10/16: stable multifocal osseus met disease, small non specific pulm nodules stable Plan Next scans in 6 months  RTC in 3 months for follow up Return to clinic in 3 month for blood work, CT scans and follow-up   No orders of the defined types were placed in this encounter.  The patient has a good understanding of the overall  plan. she agrees with it. she will call with any problems that may develop before the next visit here.   Rulon Eisenmenger, MD 04/12/16

## 2016-04-17 DIAGNOSIS — E1165 Type 2 diabetes mellitus with hyperglycemia: Secondary | ICD-10-CM | POA: Diagnosis not present

## 2016-04-17 DIAGNOSIS — E7800 Pure hypercholesterolemia, unspecified: Secondary | ICD-10-CM | POA: Diagnosis not present

## 2016-04-17 DIAGNOSIS — Z23 Encounter for immunization: Secondary | ICD-10-CM | POA: Diagnosis not present

## 2016-04-17 DIAGNOSIS — I10 Essential (primary) hypertension: Secondary | ICD-10-CM | POA: Diagnosis not present

## 2016-04-19 ENCOUNTER — Telehealth: Payer: Self-pay | Admitting: Pharmacist

## 2016-04-19 NOTE — Telephone Encounter (Signed)
Received fax notification from [place] patient assistance program that Ibrance 125mg , 21 capsules - has been shipped to the patient's home address on April 17, 2016.  [id number]  Please call [phone] with any questions.  Raul Del, PharmD, BCPS, BCOP [facility] [phone]

## 2016-04-27 ENCOUNTER — Other Ambulatory Visit: Payer: Self-pay | Admitting: Nurse Practitioner

## 2016-05-10 ENCOUNTER — Telehealth: Payer: Self-pay | Admitting: Hematology and Oncology

## 2016-05-10 ENCOUNTER — Telehealth: Payer: Self-pay | Admitting: Pharmacist

## 2016-05-10 ENCOUNTER — Ambulatory Visit (HOSPITAL_BASED_OUTPATIENT_CLINIC_OR_DEPARTMENT_OTHER): Payer: Medicare Other

## 2016-05-10 ENCOUNTER — Other Ambulatory Visit (HOSPITAL_BASED_OUTPATIENT_CLINIC_OR_DEPARTMENT_OTHER): Payer: Medicare Other

## 2016-05-10 VITALS — BP 144/51 | HR 62 | Temp 97.9°F | Resp 18

## 2016-05-10 DIAGNOSIS — C7951 Secondary malignant neoplasm of bone: Secondary | ICD-10-CM

## 2016-05-10 DIAGNOSIS — Z95828 Presence of other vascular implants and grafts: Secondary | ICD-10-CM

## 2016-05-10 DIAGNOSIS — Z5111 Encounter for antineoplastic chemotherapy: Secondary | ICD-10-CM

## 2016-05-10 DIAGNOSIS — C50411 Malignant neoplasm of upper-outer quadrant of right female breast: Secondary | ICD-10-CM

## 2016-05-10 LAB — CBC WITH DIFFERENTIAL/PLATELET
BASO%: 2.6 % — ABNORMAL HIGH (ref 0.0–2.0)
BASOS ABS: 0.1 10*3/uL (ref 0.0–0.1)
EOS ABS: 0.1 10*3/uL (ref 0.0–0.5)
EOS%: 2.1 % (ref 0.0–7.0)
HCT: 35.1 % (ref 34.8–46.6)
HGB: 11.8 g/dL (ref 11.6–15.9)
LYMPH%: 33.9 % (ref 14.0–49.7)
MCH: 33.8 pg (ref 25.1–34.0)
MCHC: 33.7 g/dL (ref 31.5–36.0)
MCV: 100.2 fL (ref 79.5–101.0)
MONO#: 0.4 10*3/uL (ref 0.1–0.9)
MONO%: 11 % (ref 0.0–14.0)
NEUT#: 1.8 10*3/uL (ref 1.5–6.5)
NEUT%: 50.4 % (ref 38.4–76.8)
PLATELETS: 283 10*3/uL (ref 145–400)
RBC: 3.5 10*6/uL — AB (ref 3.70–5.45)
RDW: 16.2 % — ABNORMAL HIGH (ref 11.2–14.5)
WBC: 3.5 10*3/uL — ABNORMAL LOW (ref 3.9–10.3)
lymph#: 1.2 10*3/uL (ref 0.9–3.3)

## 2016-05-10 LAB — COMPREHENSIVE METABOLIC PANEL
ALT: 19 U/L (ref 0–55)
ANION GAP: 9 meq/L (ref 3–11)
AST: 20 U/L (ref 5–34)
Albumin: 4 g/dL (ref 3.5–5.0)
Alkaline Phosphatase: 80 U/L (ref 40–150)
BUN: 20.7 mg/dL (ref 7.0–26.0)
CHLORIDE: 107 meq/L (ref 98–109)
CO2: 25 meq/L (ref 22–29)
Calcium: 10 mg/dL (ref 8.4–10.4)
Creatinine: 0.8 mg/dL (ref 0.6–1.1)
EGFR: 71 mL/min/{1.73_m2} — AB (ref >90)
Glucose: 140 mg/dl (ref 70–140)
Potassium: 5.2 mEq/L — ABNORMAL HIGH (ref 3.5–5.1)
Sodium: 140 mEq/L (ref 136–145)
Total Bilirubin: 0.3 mg/dL (ref 0.20–1.20)
Total Protein: 7.2 g/dL (ref 6.4–8.3)

## 2016-05-10 MED ORDER — FULVESTRANT 250 MG/5ML IM SOLN
500.0000 mg | Freq: Once | INTRAMUSCULAR | Status: AC
  Administered 2016-05-10: 500 mg via INTRAMUSCULAR
  Filled 2016-05-10: qty 10

## 2016-05-10 NOTE — Telephone Encounter (Signed)
Patient came to scheduling to add flush appointments to her 2/1 and 3/1 appointments.

## 2016-05-10 NOTE — Telephone Encounter (Signed)
Oral Chemotherapy Pharmacist Encounter  Received notification from [place] [employer]AP program that they did not receive new enrollment application faxed by our office on 03/28/16. Application resent [id number].  This encounter will continue to be updated until final determination.  [facility] will continue to follow.   Johny Drilling, PharmD, BCPS, BCOP 05/10/2016  10:36 AM [facility] [phone]

## 2016-06-07 ENCOUNTER — Ambulatory Visit (HOSPITAL_BASED_OUTPATIENT_CLINIC_OR_DEPARTMENT_OTHER): Payer: Medicare Other

## 2016-06-07 ENCOUNTER — Other Ambulatory Visit (HOSPITAL_BASED_OUTPATIENT_CLINIC_OR_DEPARTMENT_OTHER): Payer: Medicare Other

## 2016-06-07 ENCOUNTER — Other Ambulatory Visit: Payer: Medicare Other

## 2016-06-07 DIAGNOSIS — Z5111 Encounter for antineoplastic chemotherapy: Secondary | ICD-10-CM

## 2016-06-07 DIAGNOSIS — C50411 Malignant neoplasm of upper-outer quadrant of right female breast: Secondary | ICD-10-CM

## 2016-06-07 DIAGNOSIS — C7951 Secondary malignant neoplasm of bone: Secondary | ICD-10-CM

## 2016-06-07 DIAGNOSIS — Z452 Encounter for adjustment and management of vascular access device: Secondary | ICD-10-CM

## 2016-06-07 DIAGNOSIS — Z95828 Presence of other vascular implants and grafts: Secondary | ICD-10-CM

## 2016-06-07 DIAGNOSIS — Z170 Estrogen receptor positive status [ER+]: Principal | ICD-10-CM

## 2016-06-07 LAB — CBC WITH DIFFERENTIAL/PLATELET
BASO%: 2.8 % — ABNORMAL HIGH (ref 0.0–2.0)
Basophils Absolute: 0.1 10*3/uL (ref 0.0–0.1)
EOS ABS: 0.1 10*3/uL (ref 0.0–0.5)
EOS%: 3.2 % (ref 0.0–7.0)
HCT: 32.4 % — ABNORMAL LOW (ref 34.8–46.6)
HGB: 11.4 g/dL — ABNORMAL LOW (ref 11.6–15.9)
LYMPH%: 40.8 % (ref 14.0–49.7)
MCH: 33 pg (ref 25.1–34.0)
MCHC: 35.2 g/dL (ref 31.5–36.0)
MCV: 93.9 fL (ref 79.5–101.0)
MONO#: 0.2 10*3/uL (ref 0.1–0.9)
MONO%: 7.4 % (ref 0.0–14.0)
NEUT%: 45.8 % (ref 38.4–76.8)
NEUTROS ABS: 1.3 10*3/uL — AB (ref 1.5–6.5)
Platelets: 231 10*3/uL (ref 145–400)
RBC: 3.45 10*6/uL — AB (ref 3.70–5.45)
RDW: 15 % — ABNORMAL HIGH (ref 11.2–14.5)
WBC: 2.8 10*3/uL — AB (ref 3.9–10.3)
lymph#: 1.2 10*3/uL (ref 0.9–3.3)

## 2016-06-07 LAB — COMPREHENSIVE METABOLIC PANEL
ALT: 18 U/L (ref 0–55)
AST: 19 U/L (ref 5–34)
Albumin: 3.7 g/dL (ref 3.5–5.0)
Alkaline Phosphatase: 87 U/L (ref 40–150)
Anion Gap: 9 mEq/L (ref 3–11)
BUN: 18.1 mg/dL (ref 7.0–26.0)
CHLORIDE: 105 meq/L (ref 98–109)
CO2: 26 meq/L (ref 22–29)
Calcium: 9.8 mg/dL (ref 8.4–10.4)
Creatinine: 0.8 mg/dL (ref 0.6–1.1)
EGFR: 74 mL/min/{1.73_m2} — AB (ref >90)
GLUCOSE: 119 mg/dL (ref 70–140)
POTASSIUM: 4.5 meq/L (ref 3.5–5.1)
SODIUM: 140 meq/L (ref 136–145)
TOTAL PROTEIN: 6.8 g/dL (ref 6.4–8.3)
Total Bilirubin: 0.28 mg/dL (ref 0.20–1.20)

## 2016-06-07 MED ORDER — HEPARIN SOD (PORK) LOCK FLUSH 100 UNIT/ML IV SOLN
500.0000 [IU] | Freq: Once | INTRAVENOUS | Status: AC
  Administered 2016-06-07: 500 [IU] via INTRAVENOUS
  Filled 2016-06-07: qty 5

## 2016-06-07 MED ORDER — HEPARIN SOD (PORK) LOCK FLUSH 100 UNIT/ML IV SOLN
500.0000 [IU] | Freq: Once | INTRAVENOUS | Status: DC | PRN
  Filled 2016-06-07: qty 5

## 2016-06-07 MED ORDER — FULVESTRANT 250 MG/5ML IM SOLN
500.0000 mg | Freq: Once | INTRAMUSCULAR | Status: AC
  Administered 2016-06-07: 500 mg via INTRAMUSCULAR
  Filled 2016-06-07: qty 10

## 2016-06-07 MED ORDER — SODIUM CHLORIDE 0.9 % IJ SOLN
10.0000 mL | INTRAMUSCULAR | Status: DC | PRN
  Filled 2016-06-07: qty 10

## 2016-06-07 MED ORDER — ALTEPLASE 2 MG IJ SOLR
2.0000 mg | Freq: Once | INTRAMUSCULAR | Status: DC | PRN
  Filled 2016-06-07: qty 2

## 2016-06-07 MED ORDER — SODIUM CHLORIDE 0.9 % IJ SOLN
10.0000 mL | Freq: Once | INTRAMUSCULAR | Status: AC
  Administered 2016-06-07: 10 mL via INTRAVENOUS
  Filled 2016-06-07: qty 10

## 2016-06-07 NOTE — Telephone Encounter (Signed)
Oral Chemotherapy Pharmacist Encounter  Received notification from [place] that patient has been successfully enrolled into the [place] Patient Assistance program effective 06/05/16-05/06/17. Next fill of Ibrance shipped to patient on 06/05/16. [employer] will contact the office prior to filling each month for any needed refills and to confirm patient is continuing on treatment.  Patient aware of good news.  [place] Patient Assistance program for questions: [phone].  No additional needs from [facility] identified at this time. We will sign off. Please let us know if we can be of assistance in the future.  Johny Drilling, PharmD, BCPS, BCOP 06/07/2016  1:35 PM [facility] [phone]

## 2016-06-07 NOTE — Patient Instructions (Signed)

## 2016-06-20 ENCOUNTER — Other Ambulatory Visit: Payer: Self-pay | Admitting: Hematology and Oncology

## 2016-07-03 ENCOUNTER — Other Ambulatory Visit: Payer: Self-pay | Admitting: Hematology and Oncology

## 2016-07-05 ENCOUNTER — Encounter: Payer: Self-pay | Admitting: Hematology and Oncology

## 2016-07-05 ENCOUNTER — Other Ambulatory Visit: Payer: Medicare Other

## 2016-07-05 ENCOUNTER — Ambulatory Visit (HOSPITAL_BASED_OUTPATIENT_CLINIC_OR_DEPARTMENT_OTHER): Payer: Medicare Other | Admitting: Hematology and Oncology

## 2016-07-05 ENCOUNTER — Telehealth: Payer: Self-pay

## 2016-07-05 ENCOUNTER — Ambulatory Visit (HOSPITAL_BASED_OUTPATIENT_CLINIC_OR_DEPARTMENT_OTHER): Payer: Medicare Other

## 2016-07-05 ENCOUNTER — Other Ambulatory Visit (HOSPITAL_BASED_OUTPATIENT_CLINIC_OR_DEPARTMENT_OTHER): Payer: Medicare Other

## 2016-07-05 VITALS — BP 142/59 | HR 71 | Temp 97.9°F | Resp 18 | Ht 69.0 in | Wt 301.4 lb

## 2016-07-05 DIAGNOSIS — Z452 Encounter for adjustment and management of vascular access device: Secondary | ICD-10-CM

## 2016-07-05 DIAGNOSIS — C50411 Malignant neoplasm of upper-outer quadrant of right female breast: Secondary | ICD-10-CM

## 2016-07-05 DIAGNOSIS — I2699 Other pulmonary embolism without acute cor pulmonale: Secondary | ICD-10-CM | POA: Diagnosis not present

## 2016-07-05 DIAGNOSIS — C7951 Secondary malignant neoplasm of bone: Secondary | ICD-10-CM

## 2016-07-05 DIAGNOSIS — R600 Localized edema: Secondary | ICD-10-CM

## 2016-07-05 DIAGNOSIS — Z170 Estrogen receptor positive status [ER+]: Secondary | ICD-10-CM

## 2016-07-05 DIAGNOSIS — Z95828 Presence of other vascular implants and grafts: Secondary | ICD-10-CM

## 2016-07-05 DIAGNOSIS — Z5111 Encounter for antineoplastic chemotherapy: Secondary | ICD-10-CM | POA: Diagnosis not present

## 2016-07-05 DIAGNOSIS — Z7901 Long term (current) use of anticoagulants: Secondary | ICD-10-CM | POA: Diagnosis not present

## 2016-07-05 LAB — COMPREHENSIVE METABOLIC PANEL
ALK PHOS: 93 U/L (ref 40–150)
ALT: 17 U/L (ref 0–55)
ANION GAP: 8 meq/L (ref 3–11)
AST: 22 U/L (ref 5–34)
Albumin: 3.5 g/dL (ref 3.5–5.0)
BILIRUBIN TOTAL: 0.3 mg/dL (ref 0.20–1.20)
BUN: 16.8 mg/dL (ref 7.0–26.0)
CO2: 28 meq/L (ref 22–29)
Calcium: 9.6 mg/dL (ref 8.4–10.4)
Chloride: 104 mEq/L (ref 98–109)
Creatinine: 0.8 mg/dL (ref 0.6–1.1)
EGFR: 74 mL/min/{1.73_m2} — ABNORMAL LOW (ref >90)
Glucose: 156 mg/dl — ABNORMAL HIGH (ref 70–140)
Potassium: 4.3 mEq/L (ref 3.5–5.1)
Sodium: 140 mEq/L (ref 136–145)
TOTAL PROTEIN: 6.6 g/dL (ref 6.4–8.3)

## 2016-07-05 LAB — CBC WITH DIFFERENTIAL/PLATELET
BASO%: 2.4 % — ABNORMAL HIGH (ref 0.0–2.0)
Basophils Absolute: 0.1 10*3/uL (ref 0.0–0.1)
EOS%: 1.9 % (ref 0.0–7.0)
Eosinophils Absolute: 0.1 10*3/uL (ref 0.0–0.5)
HCT: 32.7 % — ABNORMAL LOW (ref 34.8–46.6)
HGB: 11.2 g/dL — ABNORMAL LOW (ref 11.6–15.9)
LYMPH%: 28.2 % (ref 14.0–49.7)
MCH: 34 pg (ref 25.1–34.0)
MCHC: 34.4 g/dL (ref 31.5–36.0)
MCV: 98.9 fL (ref 79.5–101.0)
MONO#: 0.3 10*3/uL (ref 0.1–0.9)
MONO%: 9.1 % (ref 0.0–14.0)
NEUT%: 58.4 % (ref 38.4–76.8)
NEUTROS ABS: 1.9 10*3/uL (ref 1.5–6.5)
Platelets: 283 10*3/uL (ref 145–400)
RBC: 3.3 10*6/uL — AB (ref 3.70–5.45)
RDW: 15.7 % — ABNORMAL HIGH (ref 11.2–14.5)
WBC: 3.2 10*3/uL — AB (ref 3.9–10.3)
lymph#: 0.9 10*3/uL (ref 0.9–3.3)

## 2016-07-05 MED ORDER — SODIUM CHLORIDE 0.9 % IJ SOLN
10.0000 mL | INTRAMUSCULAR | Status: DC | PRN
  Administered 2016-07-05: 10 mL via INTRAVENOUS
  Filled 2016-07-05: qty 10

## 2016-07-05 MED ORDER — FULVESTRANT 250 MG/5ML IM SOLN
500.0000 mg | Freq: Once | INTRAMUSCULAR | Status: AC
  Administered 2016-07-05: 250 mg via INTRAMUSCULAR
  Filled 2016-07-05: qty 10

## 2016-07-05 MED ORDER — HEPARIN SOD (PORK) LOCK FLUSH 100 UNIT/ML IV SOLN
500.0000 [IU] | Freq: Once | INTRAVENOUS | Status: AC | PRN
  Administered 2016-07-05: 500 [IU] via INTRAVENOUS
  Filled 2016-07-05: qty 5

## 2016-07-05 MED ORDER — DENOSUMAB 120 MG/1.7ML ~~LOC~~ SOLN
120.0000 mg | Freq: Once | SUBCUTANEOUS | Status: AC
  Administered 2016-07-05: 120 mg via SUBCUTANEOUS
  Filled 2016-07-05: qty 1.7

## 2016-07-05 NOTE — Addendum Note (Signed)
Addended by: Carlene Coria L on: 07/05/2016 03:30 PM   Modules accepted: Orders

## 2016-07-05 NOTE — Assessment & Plan Note (Signed)
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 Followed by 12 cycles of weekly Abraxane completed 07/19/2014 S/P Mastectomy 09/22/14: Bilateral mastectomies: Right: IDC Grade 2, 2.7cm, LVI, PNI, 6/8 LN Positive, Er 90%, PR 5% T2N2 (Stage IIIA) Nonhealing right breast wound: finally healed Adjuvant radiation therapy started 03/22/2015 to complete 05/11/2015  Treatment Summary: antiestrogen therapy with anastrozole 1 mg daily Jan 2017- April 2017 (stopped when she got diagnosed with metastatic disease) Anastrozole toxicities: Hot flashes for which she is prescribed gabapentin 300 twice a day  PET/CT scan 08/08/2015: Multifocal areas of increased hypermetabolic is in new/progressive but without definite underlying lesion on CT scan suspicious for metastases, T5, T7, left sacrum, left iliac bone; 13 mm gastrohepatic node SUV 5.2 Suspicious for nodal metastases. ----------------------------------------------------------------------------------------------------------------------------------------------------------------- Met diseaseTreatment plan:Ibrance with Faslodex. Started 08/12/2015 ; today cycle 12 Ibrance toxicities:  1. Mild fatigue 2. Mild nausea Patient got a grant to pay for [employer]. Blood work today was reviewed [place] is 2200.   Bone metastases XGeva once every 3 months (started 01/12/2016)  Pulmonary emboli: Currently on xarelto  CT of her chest abdomen pelvis with contrast 11/07/2015: Partial improvement of pulmonary embolism, mild progression of patchy groundglass opacity right upper lobe probably inflammatory due to pulmonary embolism, stable bone metastases, no evidence of extraosseous metastases. CT CAP 04/10/16: stable multifocal osseus met disease, small non specific pulm nodules stable Plan Next scans in 3  months  Return to clinic in 61monthfor blood work, CT scansand follow-up

## 2016-07-05 NOTE — Progress Notes (Signed)
Patient Care Team: Jacelyn Pi, MD as PCP - General (Endocrinology)  DIAGNOSIS:  Encounter Diagnosis  Name Primary?  . Malignant neoplasm of upper-outer quadrant of right breast in female, estrogen receptor positive ([place])     SUMMARY OF ONCOLOGIC HISTORY:   Breast cancer of upper-outer quadrant of right female breast ([place])   02/02/2014 Mammogram    Right breast: 5.8 cm irregular high-density solid mass suggestive of malignancy; left breast next millimeter internal mammary lymph node      02/04/2014 Initial Biopsy     2 biopsies were performed the right breast and axilla: Grade 2 invasive ductal carcinoma ER/PR positive HER-2 negative Ki-67 20% to 47%: Biopsy of right humerus metastatic breast cancer estrogen positive      02/17/2014 PET scan    Hypermetabolic right breast cancer and right axillary lymph nodes subpectoral lymph nodes indeterminate right middle lobe pulmonary nodule and hypermetabolic proximal right humerus lesion      03/05/2014 - 07/19/2014 Neo-Adjuvant Chemotherapy    Dose dense Adriamycin and Cytoxan x4 cycles followed by Abraxane weekly x12      07/26/2014 Breast MRI    Multifocal breast cancer decreased in size, retroareolar 13 mm now 11 mm, middle third 22 mm now 18 mm, 3.9 x 3.9 cm now 3.9 x 2.6 cm, multiple smaller nodules decreased in size, right x-ray lymph node 3.6 cm now 2 cm other lymph nodes are smaller      07/28/2014 - 08/12/2015 Anti-estrogen oral therapy    Anastrozole 1 mg daily      08/02/2014 PET scan    Interval improvement in the right breast mass and axillary/subpectoral lymph nodes and right humerus head. Several subpleural nodules are not visible, 3.4. cm adrenal adenoma      09/22/2014 Surgery    Bilateral mastectomy: Right: IDC Grade 2, 2.7cm, LVI, PNI, 6/8 LN Positive, Er 90%, PR 5% T2N2      03/22/2015 -  Radiation Therapy    adjuvant radiation therapy with Dr. Isidore Moos      08/10/2015 PET scan    Progression of disease with  several new bone metastases, T5, T7, left sacrum, left iliac bone, gastrohepatic lymph node      08/12/2015 -  Anti-estrogen oral therapy    Faslodex with Brock Bad every 3 months for bone metastases       CHIEF COMPLIANT: Follow-up on Faslodex with Palbociclib  INTERVAL HISTORY: ALAENA STRADER is a 71 year old metastatic breast cancer currently on palliative treatment with Faslodex and Palbociclib. She is tolerating this treatment extremely well. She denies any bone pain. In 2023/05/06 her husband passed away. She has not had any fevers or chills. She complains of bilateral lower extremity swelling. She has not been taking her Lasix regularly. She had a previous history of blood clots and is very concerned about it.  REVIEW OF SYSTEMS:   Constitutional: Denies fevers, chills or abnormal weight loss Eyes: Denies blurriness of vision Ears, nose, mouth, throat, and face: Denies mucositis or sore throat Respiratory: Denies cough, dyspnea or wheezes Cardiovascular: Denies palpitation, chest discomfort Gastrointestinal:  Denies nausea, heartburn or change in bowel habits Skin: Denies abnormal skin rashes Lymphatics: Denies new lymphadenopathy or easy bruising Neurological:Denies numbness, tingling or new weaknesses Behavioral/Psych: Mood is stable, no new changes  Extremities: Severe lower extremity edema All other systems were reviewed with the patient and are negative.  I have reviewed the past medical history, past surgical history, social history and family history with the patient and they  are unchanged from previous note.  ALLERGIES:  is allergic to amoxicillin; hydrocodone; and lisinopril.  MEDICATIONS:  Current Outpatient Prescriptions  Medication Sig Dispense Refill  . calcium-vitamin D (OSCAL WITH D) 500-200 MG-UNIT per tablet Take 1 tablet by mouth daily.    . clindamycin (CLEOCIN) 300 MG capsule Take 900 mg by mouth See admin instructions. Take 3 capsules (900 mg) by mouth  one hour prior to dental appointment - last appointment October 11, 2015    . fluticasone (FLONASE) 50 MCG/ACT nasal spray Place 1 spray into both nostrils daily as needed for allergies or rhinitis. (Patient taking differently: Place 1 spray into both nostrils at bedtime as needed for allergies or rhinitis. ) 16 g 0  . gabapentin (NEURONTIN) 300 MG capsule Take 300 mg by mouth 3 (three) times daily.     Marland Kitchen ibuprofen (ADVIL,MOTRIN) 600 MG tablet Take 600 mg by mouth every 12 (twelve) hours.    . insulin lispro protamine-lispro (HUMALOG 75/25 MIX) (75-25) 100 UNIT/ML SUSP injection Inject 10-60 Units into the skin See admin instructions. Inject 60 units subcutaneously with breakfast and supper, inject 10 mls after lunch if CBG >150    . lidocaine-prilocaine (EMLA) cream Apply 1 application topically as needed. (Patient taking differently: Apply 1 application topically as needed (prior to flushing of port (every 4-6 weeks)). ) 30 g 6  . losartan (COZAAR) 50 MG tablet Take 50 mg by mouth daily.     . metFORMIN (GLUCOPHAGE-XR) 500 MG 24 hr tablet Take 2 tablets (1,000 mg total) by mouth 2 (two) times daily.    . Multiple Vitamin (MULTIVITAMIN WITH MINERALS) TABS tablet Take 1 tablet by mouth at bedtime.    . palbociclib (IBRANCE) 125 MG capsule Take 1 capsule (125 mg total) by mouth daily with breakfast. Take whole with food. 21 capsule 3  . simvastatin (ZOCOR) 20 MG tablet Take 20 mg by mouth at bedtime.    Alveda Reasons 20 MG TABS tablet TAKE 1 TABLET BY MOUTH  DAILY WITH SUPPER 90 tablet 0   No current facility-administered medications for this visit.    Facility-Administered Medications Ordered in Other Visits  Medication Dose Route Frequency Provider Last Rate Last Dose  . fulvestrant (FASLODEX) injection 500 mg  500 mg Intramuscular Q30 days Nicholas Lose, MD   500 mg at 11/10/15 1551  . fulvestrant (FASLODEX) injection 500 mg  500 mg Intramuscular Once Nicholas Lose, MD        PHYSICAL EXAMINATION: ECOG  PERFORMANCE STATUS: 1 - Symptomatic but completely ambulatory  Vitals:   07/05/16 1430  BP: (!) 142/59  Pulse: 71  Resp: 18  Temp: 97.9 F (36.6 C)   Filed Weights   07/05/16 1430  Weight: (!) 301 lb 6.4 oz (136.7 kg)    GENERAL:alert, no distress and comfortable SKIN: skin color, texture, turgor are normal, no rashes or significant lesions EYES: normal, Conjunctiva are pink and non-injected, sclera clear OROPHARYNX:no exudate, no erythema and lips, buccal mucosa, and tongue normal  NECK: supple, thyroid normal size, non-tender, without nodularity LYMPH:  no palpable lymphadenopathy in the cervical, axillary or inguinal LUNGS: clear to auscultation and percussion with normal breathing effort HEART: regular rate & rhythm and no murmurs and no lower extremity edema ABDOMEN:abdomen soft, non-tender and normal bowel sounds MUSCULOSKELETAL:no cyanosis of digits and no clubbing  NEURO: alert & oriented x 3 with fluent speech, no focal motor/sensory deficits EXTREMITIES: 3+ lower extremity edema  LABORATORY DATA:  I have reviewed the data as  listed   Chemistry      Component Value Date/Time   NA 140 07/05/2016 1402   K 4.3 07/05/2016 1402   CL 107 10/24/2015 0431   CO2 28 07/05/2016 1402   BUN 16.8 07/05/2016 1402   CREATININE 0.8 07/05/2016 1402      Component Value Date/Time   CALCIUM 9.6 07/05/2016 1402   ALKPHOS 93 07/05/2016 1402   AST 22 07/05/2016 1402   ALT 17 07/05/2016 1402   BILITOT 0.30 07/05/2016 1402       Lab Results  Component Value Date   WBC 3.2 (L) 07/05/2016   HGB 11.2 (L) 07/05/2016   HCT 32.7 (L) 07/05/2016   MCV 98.9 07/05/2016   PLT 283 07/05/2016   NEUTROABS 1.9 07/05/2016    ASSESSMENT & PLAN:  Breast cancer of upper-outer quadrant of right female breast ([place]) 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 Followed by 12 cycles of weekly Abraxane completed 07/19/2014 S/P Mastectomy 09/22/14: Bilateral mastectomies: Right: IDC Grade 2, 2.7cm, LVI, PNI, 6/8 LN Positive, Er 90%, PR 5% T2N2 (Stage IIIA) Nonhealing right breast wound: finally healed Adjuvant radiation therapy started 03/22/2015 to complete 05/11/2015  Treatment Summary: antiestrogen therapy with anastrozole 1 mg daily Jan 2017- April 2017 (stopped when she got diagnosed with metastatic disease) Anastrozole toxicities: Hot flashes for which she is prescribed gabapentin 300 twice a day  PET/CT scan 08/08/2015: Multifocal areas of increased hypermetabolic is in new/progressive but without definite underlying lesion on CT scan suspicious for metastases, T5, T7, left sacrum, left iliac bone; 13 mm gastrohepatic node SUV 5.2 Suspicious for nodal metastases. ----------------------------------------------------------------------------------------------------------------------------------------------------------------- Met diseaseTreatment plan:Ibrance with Faslodex. Started 08/12/2015 ; today cycle 12 Ibrance toxicities:  1. Mild fatigue 2. Mild nausea Patient got a grant to pay for [employer]. Blood work today was reviewed [place] is 2200.   Bone metastases XGeva once every 3 months (started 01/12/2016)  Pulmonary emboli: Currently on xarelto  CT of her chest abdomen pelvis with contrast 11/07/2015: Partial improvement of pulmonary embolism, mild progression of patchy groundglass opacity right upper lobe probably inflammatory due to pulmonary embolism, stable bone metastases, no evidence of extraosseous metastases. CT CAP 04/10/16: stable multifocal osseus met disease, small non specific pulm nodules stable Plan Next scans in 3 months  Lower extremity edema: We will try to obtain a vascular ultrasound to evaluate for DVT in the next few days. I instructed the patient to take Lasix regularly. She has normal  kidney and liver function. It is possibly related to her heart function.  Return to clinic in 49monthfor blood work, CT scansand follow-up   I spent 25 minutes talking to the patient of which more than half was spent in counseling and coordination of care.  No orders of the defined types were placed in this encounter.  The patient has a good understanding of the overall plan. she agrees with it. she will call with any problems that may develop before the next visit here.   GRulon Eisenmenger MD 07/05/16

## 2016-07-05 NOTE — Telephone Encounter (Signed)
Called pt to verify and confirm her bilateral lower leg ultrasound test at [facility] on 3/9 Fri at [facility]. Pt states that she will be there. Thankful for reminder.

## 2016-07-07 ENCOUNTER — Other Ambulatory Visit: Payer: Self-pay | Admitting: Hematology and Oncology

## 2016-07-10 DIAGNOSIS — H40013 Open angle with borderline findings, low risk, bilateral: Secondary | ICD-10-CM | POA: Diagnosis not present

## 2016-07-10 DIAGNOSIS — E119 Type 2 diabetes mellitus without complications: Secondary | ICD-10-CM | POA: Diagnosis not present

## 2016-07-10 DIAGNOSIS — H2513 Age-related nuclear cataract, bilateral: Secondary | ICD-10-CM | POA: Diagnosis not present

## 2016-07-12 ENCOUNTER — Encounter (HOSPITAL_COMMUNITY): Payer: Medicare Other

## 2016-07-13 ENCOUNTER — Encounter (HOSPITAL_COMMUNITY): Payer: Medicare Other

## 2016-07-16 ENCOUNTER — Ambulatory Visit (HOSPITAL_COMMUNITY)
Admission: RE | Admit: 2016-07-16 | Discharge: 2016-07-16 | Disposition: A | Discharge status: Home / Self Care | Payer: Medicare Other | Source: Ambulatory Visit | Attending: Hematology and Oncology | Admitting: Hematology and Oncology

## 2016-07-16 ENCOUNTER — Telehealth: Payer: Self-pay

## 2016-07-16 DIAGNOSIS — Z86718 Personal history of other venous thrombosis and embolism: Secondary | ICD-10-CM | POA: Insufficient documentation

## 2016-07-16 DIAGNOSIS — R600 Localized edema: Secondary | ICD-10-CM | POA: Diagnosis not present

## 2016-07-16 DIAGNOSIS — Z170 Estrogen receptor positive status [ER+]: Secondary | ICD-10-CM | POA: Diagnosis not present

## 2016-07-16 DIAGNOSIS — C50411 Malignant neoplasm of upper-outer quadrant of right female breast: Secondary | ICD-10-CM

## 2016-07-16 NOTE — Telephone Encounter (Signed)
Results called in from [facility]. Pt is negative for DVT on bilateral doppler test. Notified Dr.Gudena of results.

## 2016-07-16 NOTE — Progress Notes (Signed)
Preliminary results by tech - Venous Duplex Lower Ext. Completed. Negative for deep or superficial vein trombosis in both legs with resolution of prior thrombus found on 10/23/15. Oda Cogan, BS, RDMS, RVT

## 2016-08-02 ENCOUNTER — Ambulatory Visit: Payer: Medicare Other

## 2016-08-02 ENCOUNTER — Ambulatory Visit (HOSPITAL_BASED_OUTPATIENT_CLINIC_OR_DEPARTMENT_OTHER): Payer: Medicare Other

## 2016-08-02 ENCOUNTER — Other Ambulatory Visit (HOSPITAL_BASED_OUTPATIENT_CLINIC_OR_DEPARTMENT_OTHER): Payer: Medicare Other

## 2016-08-02 VITALS — BP 155/66 | HR 66 | Temp 97.8°F | Resp 20

## 2016-08-02 DIAGNOSIS — C50411 Malignant neoplasm of upper-outer quadrant of right female breast: Secondary | ICD-10-CM | POA: Diagnosis not present

## 2016-08-02 DIAGNOSIS — Z5111 Encounter for antineoplastic chemotherapy: Secondary | ICD-10-CM

## 2016-08-02 DIAGNOSIS — C7951 Secondary malignant neoplasm of bone: Secondary | ICD-10-CM

## 2016-08-02 DIAGNOSIS — Z95828 Presence of other vascular implants and grafts: Secondary | ICD-10-CM

## 2016-08-02 DIAGNOSIS — Z452 Encounter for adjustment and management of vascular access device: Secondary | ICD-10-CM

## 2016-08-02 LAB — COMPREHENSIVE METABOLIC PANEL
ALT: 20 U/L (ref 0–55)
ANION GAP: 10 meq/L (ref 3–11)
AST: 24 U/L (ref 5–34)
Albumin: 3.4 g/dL — ABNORMAL LOW (ref 3.5–5.0)
Alkaline Phosphatase: 107 U/L (ref 40–150)
BUN: 21.1 mg/dL (ref 7.0–26.0)
CHLORIDE: 104 meq/L (ref 98–109)
CO2: 25 meq/L (ref 22–29)
CREATININE: 1 mg/dL (ref 0.6–1.1)
Calcium: 9.3 mg/dL (ref 8.4–10.4)
EGFR: 56 mL/min/{1.73_m2} — ABNORMAL LOW (ref >90)
Glucose: 271 mg/dl — ABNORMAL HIGH (ref 70–140)
Potassium: 4.5 mEq/L (ref 3.5–5.1)
Sodium: 139 mEq/L (ref 136–145)
Total Bilirubin: 0.38 mg/dL (ref 0.20–1.20)
Total Protein: 6.6 g/dL (ref 6.4–8.3)

## 2016-08-02 LAB — CBC WITH DIFFERENTIAL/PLATELET
BASO%: 2.4 % — AB (ref 0.0–2.0)
Basophils Absolute: 0.1 10*3/uL (ref 0.0–0.1)
EOS%: 2.7 % (ref 0.0–7.0)
Eosinophils Absolute: 0.1 10*3/uL (ref 0.0–0.5)
HCT: 32.1 % — ABNORMAL LOW (ref 34.8–46.6)
HGB: 10.7 g/dL — ABNORMAL LOW (ref 11.6–15.9)
LYMPH%: 29.6 % (ref 14.0–49.7)
MCH: 33 pg (ref 25.1–34.0)
MCHC: 33.3 g/dL (ref 31.5–36.0)
MCV: 99.1 fL (ref 79.5–101.0)
MONO#: 0.2 10*3/uL (ref 0.1–0.9)
MONO%: 7.6 % (ref 0.0–14.0)
NEUT#: 1.7 10*3/uL (ref 1.5–6.5)
NEUT%: 57.7 % (ref 38.4–76.8)
Platelets: 245 10*3/uL (ref 145–400)
RBC: 3.24 10*6/uL — AB (ref 3.70–5.45)
RDW: 15.5 % — ABNORMAL HIGH (ref 11.2–14.5)
WBC: 2.9 10*3/uL — AB (ref 3.9–10.3)
lymph#: 0.9 10*3/uL (ref 0.9–3.3)

## 2016-08-02 MED ORDER — SODIUM CHLORIDE 0.9 % IJ SOLN
10.0000 mL | INTRAMUSCULAR | Status: DC | PRN
  Administered 2016-08-02: 10 mL via INTRAVENOUS
  Filled 2016-08-02: qty 10

## 2016-08-02 MED ORDER — FULVESTRANT 250 MG/5ML IM SOLN
500.0000 mg | Freq: Once | INTRAMUSCULAR | Status: AC
  Administered 2016-08-02: 500 mg via INTRAMUSCULAR
  Filled 2016-08-02: qty 10

## 2016-08-02 MED ORDER — HEPARIN SOD (PORK) LOCK FLUSH 100 UNIT/ML IV SOLN
500.0000 [IU] | Freq: Once | INTRAVENOUS | Status: AC | PRN
  Administered 2016-08-02: 500 [IU] via INTRAVENOUS
  Filled 2016-08-02: qty 5

## 2016-08-02 NOTE — Patient Instructions (Signed)
Implanted Port Home Guide An implanted port is a type of central line that is placed under the skin. Central lines are used to provide IV access when treatment or nutrition needs to be given through a person's veins. Implanted ports are used for long-term IV access. An implanted port may be placed because:  You need IV medicine that would be irritating to the small veins in your hands or arms.  You need long-term IV medicines, such as antibiotics.  You need IV nutrition for a long period.  You need frequent blood draws for lab tests.  You need dialysis. Implanted ports are usually placed in the chest area, but they can also be placed in the upper arm, the abdomen, or the leg. An implanted port has two main parts:  Reservoir. The reservoir is round and will appear as a small, raised area under your skin. The reservoir is the part where a needle is inserted to give medicines or draw blood.  Catheter. The catheter is a thin, flexible tube that extends from the reservoir. The catheter is placed into a large vein. Medicine that is inserted into the reservoir goes into the catheter and then into the vein. How will I care for my incision site? Do not get the incision site wet. Bathe or shower as directed by your health care provider. How is my port accessed? Special steps must be taken to access the port:  Before the port is accessed, a numbing cream can be placed on the skin. This helps numb the skin over the port site.  Your health care provider uses a sterile technique to access the port.  Your health care provider must put on a mask and sterile gloves.  The skin over your port is cleaned carefully with an antiseptic and allowed to dry.  The port is gently pinched between sterile gloves, and a needle is inserted into the port.  Only "non-coring" port needles should be used to access the port. Once the port is accessed, a blood return should be checked. This helps ensure that the port is  in the vein and is not clogged.  If your port needs to remain accessed for a constant infusion, a clear (transparent) bandage will be placed over the needle site. The bandage and needle will need to be changed every week, or as directed by your health care provider.  Keep the bandage covering the needle clean and dry. Do not get it wet. Follow your health care provider's instructions on how to take a shower or bath while the port is accessed.  If your port does not need to stay accessed, no bandage is needed over the port. What is flushing? Flushing helps keep the port from getting clogged. Follow your health care provider's instructions on how and when to flush the port. Ports are usually flushed with saline solution or a medicine called heparin. The need for flushing will depend on how the port is used.  If the port is used for intermittent medicines or blood draws, the port will need to be flushed:  After medicines have been given.  After blood has been drawn.  As part of routine maintenance.  If a constant infusion is running, the port may not need to be flushed. How long will my port stay implanted? The port can stay in for as long as your health care provider thinks it is needed. When it is time for the port to come out, surgery will be done to remove it.   done to remove it. The procedure is similar to the one performed when the port was put in. When should I seek immediate medical care? When you have an implanted port, you should seek immediate medical care if:  You notice a bad smell coming from the incision site.  You have swelling, redness, or drainage at the incision site.  You have more swelling or pain at the port site or the surrounding area.  You have a fever that is not controlled with medicine.  This information is not intended to replace advice given to you by your health care provider. Make sure you discuss any questions you have with your health care provider. Document  Released: 04/23/2005 Document Revised: 09/29/2015 Document Reviewed: 12/29/2012 Elsevier Interactive Patient Education  2017 Else[employer]. Fulvestrant injection What is this medicine? FULVESTRANT (ful VES trant) blocks the effects of estrogen. It is used to treat breast cancer. This medicine may be used for other purposes; ask your health care provider or pharmacist if you have questions. COMMON BRAND NAME(S): FASLODEX What should I tell my health care provider before I take this medicine? They need to know if you have any of these conditions: -bleeding problems -liver disease -low levels of platelets in the blood -an unusual or allergic reaction to fulvestrant, other medicines, foods, dyes, or preservatives -pregnant or trying to get pregnant -breast-feeding How should I use this medicine? This medicine is for injection into a muscle. It is usually given by a health care professional in a hospital or clinic setting. Talk to your pediatrician regarding the use of this medicine in children. Special care may be needed. Overdosage: If you think you have taken too much of this medicine contact a poison control center or emergency room at once. NOTE: This medicine is only for you. Do not share this medicine with others. What if I miss a dose? It is important not to miss your dose. Call your doctor or health care professional if you are unable to keep an appointment. What may interact with this medicine? -medicines that treat or prevent blood clots like warfarin, enoxaparin, and dalteparin This list may not describe all possible interactions. Give your health care provider a list of all the medicines, herbs, non-prescription drugs, or dietary supplements you use. Also tell them if you smoke, drink alcohol, or use illegal drugs. Some items may interact with your medicine. What should I watch for while using this medicine? Your condition will be monitored carefully while you are receiving this  medicine. You will need important blood work done while you are taking this medicine. Do not become pregnant while taking this medicine or for at least 1 year after stopping it. Women of child-bearing potential will need to have a negative pregnancy test before starting this medicine. Women should inform their doctor if they wish to become pregnant or think they might be pregnant. There is a potential for serious side effects to an unborn child. Men should inform their doctors if they wish to father a child. This medicine may lower sperm counts. Talk to your health care professional or pharmacist for more information. Do not breast-feed an infant while taking this medicine or for 1 year after the last dose. What side effects may I notice from receiving this medicine? Side effects that you should report to your doctor or health care professional as soon as possible: -allergic reactions like skin rash, itching or hives, swelling of the face, lips, or tongue -feeling faint or lightheaded, falls -pain,   tingling, numbness, or weakness in the legs -signs and symptoms of infection like fever or chills; cough; flu-like symptoms; sore throat -vaginal bleeding Side effects that usually do not require medical attention (report to your doctor or health care professional if they continue or are bothersome): -aches, pains -constipation -diarrhea -headache -hot flashes -nausea, vomiting -pain at site where injected -stomach pain This list may not describe all possible side effects. Call your doctor for medical advice about side effects. You may report side effects to FDA at 1-800-FDA-1088. Where should I keep my medicine? This drug is given in a hospital or clinic and will not be stored at home. NOTE: This sheet is a summary. It may not cover all possible information. If you have questions about this medicine, talk to your doctor, pharmacist, or health care provider.  2018 Elsevier/Gold Standard (2014-11-19  11:03:55)  

## 2016-08-02 NOTE — Progress Notes (Signed)
Per Dr. Lindi Adie, pt to receive Faslodex only today, no Xgeva. Pt aware and verbalizes understanding. Pt stable at discharge.

## 2016-08-09 DIAGNOSIS — C50911 Malignant neoplasm of unspecified site of right female breast: Secondary | ICD-10-CM | POA: Diagnosis not present

## 2016-08-22 ENCOUNTER — Other Ambulatory Visit: Payer: Self-pay | Admitting: Hematology and Oncology

## 2016-08-29 ENCOUNTER — Other Ambulatory Visit: Payer: Self-pay

## 2016-08-29 DIAGNOSIS — C50411 Malignant neoplasm of upper-outer quadrant of right female breast: Secondary | ICD-10-CM

## 2016-08-29 DIAGNOSIS — Z170 Estrogen receptor positive status [ER+]: Principal | ICD-10-CM

## 2016-08-30 ENCOUNTER — Other Ambulatory Visit (HOSPITAL_BASED_OUTPATIENT_CLINIC_OR_DEPARTMENT_OTHER): Payer: Medicare Other

## 2016-08-30 ENCOUNTER — Ambulatory Visit: Payer: Medicare Other

## 2016-08-30 ENCOUNTER — Ambulatory Visit (HOSPITAL_BASED_OUTPATIENT_CLINIC_OR_DEPARTMENT_OTHER): Payer: Medicare Other

## 2016-08-30 DIAGNOSIS — Z5111 Encounter for antineoplastic chemotherapy: Secondary | ICD-10-CM | POA: Diagnosis not present

## 2016-08-30 DIAGNOSIS — C50411 Malignant neoplasm of upper-outer quadrant of right female breast: Secondary | ICD-10-CM | POA: Diagnosis not present

## 2016-08-30 DIAGNOSIS — Z170 Estrogen receptor positive status [ER+]: Principal | ICD-10-CM

## 2016-08-30 DIAGNOSIS — Z95828 Presence of other vascular implants and grafts: Secondary | ICD-10-CM

## 2016-08-30 DIAGNOSIS — C7951 Secondary malignant neoplasm of bone: Secondary | ICD-10-CM

## 2016-08-30 LAB — CBC WITH DIFFERENTIAL/PLATELET
BASO%: 2.4 % — ABNORMAL HIGH (ref 0.0–2.0)
BASOS ABS: 0.1 10*3/uL (ref 0.0–0.1)
EOS ABS: 0 10*3/uL (ref 0.0–0.5)
EOS%: 1.5 % (ref 0.0–7.0)
HCT: 33.8 % — ABNORMAL LOW (ref 34.8–46.6)
HEMOGLOBIN: 11.5 g/dL — AB (ref 11.6–15.9)
LYMPH%: 27.5 % (ref 14.0–49.7)
MCH: 33.9 pg (ref 25.1–34.0)
MCHC: 33.9 g/dL (ref 31.5–36.0)
MCV: 100 fL (ref 79.5–101.0)
MONO#: 0.3 10*3/uL (ref 0.1–0.9)
MONO%: 9.7 % (ref 0.0–14.0)
NEUT#: 1.9 10*3/uL (ref 1.5–6.5)
NEUT%: 58.9 % (ref 38.4–76.8)
Platelets: 291 10*3/uL (ref 145–400)
RBC: 3.38 10*6/uL — ABNORMAL LOW (ref 3.70–5.45)
RDW: 16.4 % — AB (ref 11.2–14.5)
WBC: 3.2 10*3/uL — ABNORMAL LOW (ref 3.9–10.3)
lymph#: 0.9 10*3/uL (ref 0.9–3.3)

## 2016-08-30 LAB — COMPREHENSIVE METABOLIC PANEL
ALBUMIN: 3.6 g/dL (ref 3.5–5.0)
ALT: 23 U/L (ref 0–55)
ANION GAP: 11 meq/L (ref 3–11)
AST: 30 U/L (ref 5–34)
Alkaline Phosphatase: 120 U/L (ref 40–150)
BILIRUBIN TOTAL: 0.26 mg/dL (ref 0.20–1.20)
BUN: 25.4 mg/dL (ref 7.0–26.0)
CHLORIDE: 105 meq/L (ref 98–109)
CO2: 27 meq/L (ref 22–29)
Calcium: 10.2 mg/dL (ref 8.4–10.4)
Creatinine: 1.3 mg/dL — ABNORMAL HIGH (ref 0.6–1.1)
EGFR: 43 mL/min/{1.73_m2} — ABNORMAL LOW (ref >90)
Glucose: 152 mg/dl — ABNORMAL HIGH (ref 70–140)
SODIUM: 143 meq/L (ref 136–145)
Total Protein: 7 g/dL (ref 6.4–8.3)

## 2016-08-30 MED ORDER — FULVESTRANT 250 MG/5ML IM SOLN
500.0000 mg | Freq: Once | INTRAMUSCULAR | Status: AC
  Administered 2016-08-30: 500 mg via INTRAMUSCULAR
  Filled 2016-08-30: qty 10

## 2016-08-30 MED ORDER — SODIUM CHLORIDE 0.9 % IJ SOLN
10.0000 mL | INTRAMUSCULAR | Status: DC | PRN
  Filled 2016-08-30: qty 10

## 2016-08-30 MED ORDER — HEPARIN SOD (PORK) LOCK FLUSH 100 UNIT/ML IV SOLN
500.0000 [IU] | Freq: Once | INTRAVENOUS | Status: DC | PRN
  Filled 2016-08-30: qty 5

## 2016-09-27 ENCOUNTER — Ambulatory Visit (HOSPITAL_BASED_OUTPATIENT_CLINIC_OR_DEPARTMENT_OTHER): Payer: Medicare Other

## 2016-09-27 ENCOUNTER — Other Ambulatory Visit (HOSPITAL_BASED_OUTPATIENT_CLINIC_OR_DEPARTMENT_OTHER): Payer: Medicare Other

## 2016-09-27 DIAGNOSIS — C50411 Malignant neoplasm of upper-outer quadrant of right female breast: Secondary | ICD-10-CM | POA: Diagnosis not present

## 2016-09-27 DIAGNOSIS — Z5111 Encounter for antineoplastic chemotherapy: Secondary | ICD-10-CM | POA: Diagnosis not present

## 2016-09-27 DIAGNOSIS — Z95828 Presence of other vascular implants and grafts: Secondary | ICD-10-CM

## 2016-09-27 DIAGNOSIS — Z452 Encounter for adjustment and management of vascular access device: Secondary | ICD-10-CM

## 2016-09-27 DIAGNOSIS — C7951 Secondary malignant neoplasm of bone: Secondary | ICD-10-CM

## 2016-09-27 DIAGNOSIS — Z170 Estrogen receptor positive status [ER+]: Principal | ICD-10-CM

## 2016-09-27 LAB — COMPREHENSIVE METABOLIC PANEL
ALT: 26 U/L (ref 0–55)
ANION GAP: 7 meq/L (ref 3–11)
AST: 36 U/L — AB (ref 5–34)
Albumin: 3.4 g/dL — ABNORMAL LOW (ref 3.5–5.0)
Alkaline Phosphatase: 147 U/L (ref 40–150)
BUN: 25.2 mg/dL (ref 7.0–26.0)
CALCIUM: 9.9 mg/dL (ref 8.4–10.4)
CO2: 26 meq/L (ref 22–29)
CREATININE: 1.1 mg/dL (ref 0.6–1.1)
Chloride: 102 mEq/L (ref 98–109)
EGFR: 51 mL/min/{1.73_m2} — ABNORMAL LOW (ref >90)
Glucose: 175 mg/dl — ABNORMAL HIGH (ref 70–140)
POTASSIUM: 4.8 meq/L (ref 3.5–5.1)
Sodium: 136 mEq/L (ref 136–145)
Total Bilirubin: 0.3 mg/dL (ref 0.20–1.20)
Total Protein: 6.7 g/dL (ref 6.4–8.3)

## 2016-09-27 LAB — CBC WITH DIFFERENTIAL/PLATELET
BASO%: 2.2 % — ABNORMAL HIGH (ref 0.0–2.0)
BASOS ABS: 0.1 10*3/uL (ref 0.0–0.1)
EOS ABS: 0.1 10*3/uL (ref 0.0–0.5)
EOS%: 1.9 % (ref 0.0–7.0)
HCT: 33.4 % — ABNORMAL LOW (ref 34.8–46.6)
HEMOGLOBIN: 11 g/dL — AB (ref 11.6–15.9)
LYMPH#: 1 10*3/uL (ref 0.9–3.3)
LYMPH%: 32.7 % (ref 14.0–49.7)
MCH: 32.7 pg (ref 25.1–34.0)
MCHC: 32.9 g/dL (ref 31.5–36.0)
MCV: 99.4 fL (ref 79.5–101.0)
MONO#: 0.3 10*3/uL (ref 0.1–0.9)
MONO%: 7.9 % (ref 0.0–14.0)
NEUT#: 1.8 10*3/uL (ref 1.5–6.5)
NEUT%: 55.3 % (ref 38.4–76.8)
PLATELETS: 280 10*3/uL (ref 145–400)
RBC: 3.36 10*6/uL — ABNORMAL LOW (ref 3.70–5.45)
RDW: 15.3 % — AB (ref 11.2–14.5)
WBC: 3.2 10*3/uL — ABNORMAL LOW (ref 3.9–10.3)

## 2016-09-27 MED ORDER — SODIUM CHLORIDE 0.9 % IJ SOLN
10.0000 mL | INTRAMUSCULAR | Status: DC | PRN
  Filled 2016-09-27: qty 10

## 2016-09-27 MED ORDER — HEPARIN SOD (PORK) LOCK FLUSH 100 UNIT/ML IV SOLN
500.0000 [IU] | Freq: Once | INTRAVENOUS | Status: AC | PRN
  Administered 2016-09-27: 500 [IU] via INTRAVENOUS
  Filled 2016-09-27: qty 5

## 2016-09-27 MED ORDER — FULVESTRANT 250 MG/5ML IM SOLN
500.0000 mg | Freq: Once | INTRAMUSCULAR | Status: AC
Start: 2016-09-27 — End: 2016-09-27
  Administered 2016-09-27: 500 mg via INTRAMUSCULAR
  Filled 2016-09-27: qty 10

## 2016-09-27 NOTE — Patient Instructions (Signed)

## 2016-09-27 NOTE — Patient Instructions (Signed)

## 2016-10-02 ENCOUNTER — Encounter (HOSPITAL_COMMUNITY): Payer: Self-pay

## 2016-10-02 ENCOUNTER — Ambulatory Visit (HOSPITAL_COMMUNITY)
Admission: RE | Admit: 2016-10-02 | Discharge: 2016-10-02 | Disposition: A | Discharge status: Home / Self Care | Payer: Medicare Other | Source: Ambulatory Visit | Attending: Hematology and Oncology | Admitting: Hematology and Oncology

## 2016-10-02 DIAGNOSIS — C7951 Secondary malignant neoplasm of bone: Secondary | ICD-10-CM | POA: Insufficient documentation

## 2016-10-02 DIAGNOSIS — R911 Solitary pulmonary nodule: Secondary | ICD-10-CM | POA: Diagnosis not present

## 2016-10-02 DIAGNOSIS — C50411 Malignant neoplasm of upper-outer quadrant of right female breast: Secondary | ICD-10-CM | POA: Insufficient documentation

## 2016-10-02 DIAGNOSIS — C787 Secondary malignant neoplasm of liver and intrahepatic bile duct: Secondary | ICD-10-CM | POA: Diagnosis not present

## 2016-10-02 DIAGNOSIS — Z170 Estrogen receptor positive status [ER+]: Secondary | ICD-10-CM | POA: Insufficient documentation

## 2016-10-02 DIAGNOSIS — C50919 Malignant neoplasm of unspecified site of unspecified female breast: Secondary | ICD-10-CM | POA: Diagnosis not present

## 2016-10-02 MED ORDER — IOPAMIDOL (ISOVUE-300) INJECTION 61%
100.0000 mL | Freq: Once | INTRAVENOUS | Status: AC | PRN
  Administered 2016-10-02: 100 mL via INTRAVENOUS

## 2016-10-09 ENCOUNTER — Ambulatory Visit (HOSPITAL_BASED_OUTPATIENT_CLINIC_OR_DEPARTMENT_OTHER): Payer: Medicare Other | Admitting: Hematology and Oncology

## 2016-10-09 ENCOUNTER — Encounter: Payer: Self-pay | Admitting: Hematology and Oncology

## 2016-10-09 DIAGNOSIS — Z7901 Long term (current) use of anticoagulants: Secondary | ICD-10-CM | POA: Diagnosis not present

## 2016-10-09 DIAGNOSIS — I2699 Other pulmonary embolism without acute cor pulmonale: Secondary | ICD-10-CM

## 2016-10-09 DIAGNOSIS — C7951 Secondary malignant neoplasm of bone: Secondary | ICD-10-CM

## 2016-10-09 DIAGNOSIS — Z170 Estrogen receptor positive status [ER+]: Secondary | ICD-10-CM

## 2016-10-09 DIAGNOSIS — C50411 Malignant neoplasm of upper-outer quadrant of right female breast: Secondary | ICD-10-CM | POA: Diagnosis not present

## 2016-10-09 NOTE — Progress Notes (Signed)
Patient Care Team: Jacelyn Pi, MD as PCP - General (Endocrinology)  DIAGNOSIS:  Encounter Diagnosis  Name Primary?  . Malignant neoplasm of upper-outer quadrant of right breast in female, estrogen receptor positive ([place])     SUMMARY OF ONCOLOGIC HISTORY:   Breast cancer of upper-outer quadrant of right female breast ([place])   02/02/2014 Mammogram    Right breast: 5.8 cm irregular high-density solid mass suggestive of malignancy; left breast next millimeter internal mammary lymph node      02/04/2014 Initial Biopsy     2 biopsies were performed the right breast and axilla: Grade 2 invasive ductal carcinoma ER/PR positive HER-2 negative Ki-67 20% to 47%: Biopsy of right humerus metastatic breast cancer estrogen positive      02/17/2014 PET scan    Hypermetabolic right breast cancer and right axillary lymph nodes subpectoral lymph nodes indeterminate right middle lobe pulmonary nodule and hypermetabolic proximal right humerus lesion      03/05/2014 - 07/19/2014 Neo-Adjuvant Chemotherapy    Dose dense Adriamycin and Cytoxan x4 cycles followed by Abraxane weekly x12      07/26/2014 Breast MRI    Multifocal breast cancer decreased in size, retroareolar 13 mm now 11 mm, middle third 22 mm now 18 mm, 3.9 x 3.9 cm now 3.9 x 2.6 cm, multiple smaller nodules decreased in size, right x-ray lymph node 3.6 cm now 2 cm other lymph nodes are smaller      07/28/2014 - 08/12/2015 Anti-estrogen oral therapy    Anastrozole 1 mg daily      08/02/2014 PET scan    Interval improvement in the right breast mass and axillary/subpectoral lymph nodes and right humerus head. Several subpleural nodules are not visible, 3.4. cm adrenal adenoma      09/22/2014 Surgery    Bilateral mastectomy: Right: IDC Grade 2, 2.7cm, LVI, PNI, 6/8 LN Positive, Er 90%, PR 5% T2N2      03/22/2015 -  Radiation Therapy    adjuvant radiation therapy with Dr. Isidore Moos      08/10/2015 PET scan    Progression of disease with  several new bone metastases, T5, T7, left sacrum, left iliac bone, gastrohepatic lymph node      08/12/2015 -  Anti-estrogen oral therapy    Faslodex with Brock Bad every 3 months for bone metastases      CHIEF COMPLIANT: Patient is here to discuss the results and CT scan  INTERVAL HISTORY: Tonya Alvarez is a 71 year old with above-mentioned history metastatic breast cancer who had been on Palbociclib and Faslodex since April 2017. She had recent CT chest abdomen pelvis and is here today to discuss the results accompanied by her daughter. She was noted to have progression of disease in the liver as well as in the lung is a new nodule. She does not have any major symptoms other than arthritis. Appetite has been excellent. Denies any nausea vomiting.  REVIEW OF SYSTEMS:   Constitutional: Denies fevers, chills or abnormal weight loss Eyes: Denies blurriness of vision Ears, nose, mouth, throat, and face: Denies mucositis or sore throat Respiratory: Denies cough, dyspnea or wheezes Cardiovascular: Denies palpitation, chest discomfort Gastrointestinal:  Denies nausea, heartburn or change in bowel habits Skin: Denies abnormal skin rashes Lymphatics: Denies new lymphadenopathy or easy bruising Neurological:Denies numbness, tingling or new weaknesses Behavioral/Psych: Mood is stable, no new changes  Extremities: Arthritic pain  All other systems were reviewed with the patient and are negative.  I have reviewed the past medical history, past surgical  history, social history and family history with the patient and they are unchanged from previous note.  ALLERGIES:  is allergic to amoxicillin; hydrocodone; and lisinopril.  MEDICATIONS:  Current Outpatient Prescriptions  Medication Sig Dispense Refill  . calcium-vitamin D (OSCAL WITH D) 500-200 MG-UNIT per tablet Take 1 tablet by mouth daily.    . clindamycin (CLEOCIN) 300 MG capsule Take 900 mg by mouth See admin instructions. Take 3  capsules (900 mg) by mouth one hour prior to dental appointment - last appointment October 11, 2015    . fluticasone (FLONASE) 50 MCG/ACT nasal spray Place 1 spray into both nostrils daily as needed for allergies or rhinitis. (Patient taking differently: Place 1 spray into both nostrils at bedtime as needed for allergies or rhinitis. ) 16 g 0  . furosemide (LASIX) 40 MG tablet TAKE 1 TABLET BY MOUTH  DAILY 90 tablet 1  . gabapentin (NEURONTIN) 300 MG capsule Take 300 mg by mouth 3 (three) times daily.     Marland Kitchen ibuprofen (ADVIL,MOTRIN) 600 MG tablet Take 600 mg by mouth every 12 (twelve) hours.    . insulin lispro protamine-lispro (HUMALOG 75/25 MIX) (75-25) 100 UNIT/ML SUSP injection Inject 10-60 Units into the skin See admin instructions. Inject 60 units subcutaneously with breakfast and supper, inject 10 mls after lunch if CBG >150    . lidocaine-prilocaine (EMLA) cream Apply 1 application topically as needed. (Patient taking differently: Apply 1 application topically as needed (prior to flushing of port (every 4-6 weeks)). ) 30 g 6  . losartan (COZAAR) 50 MG tablet Take 50 mg by mouth daily.     . metFORMIN (GLUCOPHAGE-XR) 500 MG 24 hr tablet Take 2 tablets (1,000 mg total) by mouth 2 (two) times daily.    . Multiple Vitamin (MULTIVITAMIN WITH MINERALS) TABS tablet Take 1 tablet by mouth at bedtime.    . palbociclib (IBRANCE) 125 MG capsule Take 1 capsule (125 mg total) by mouth daily with breakfast. Take whole with food. 21 capsule 3  . simvastatin (ZOCOR) 20 MG tablet Take 20 mg by mouth at bedtime.    Alveda Reasons 20 MG TABS tablet TAKE 1 TABLET BY MOUTH  DAILY WITH SUPPER 90 tablet 0   No current facility-administered medications for this visit.    Facility-Administered Medications Ordered in Other Visits  Medication Dose Route Frequency Provider Last Rate Last Dose  . fulvestrant (FASLODEX) injection 500 mg  500 mg Intramuscular Q30 days Nicholas Lose, MD   500 mg at 11/10/15 1551    PHYSICAL  EXAMINATION: ECOG PERFORMANCE STATUS: 1 - Symptomatic but completely ambulatory  Vitals:   10/09/16 0918  BP: (!) 162/79  Pulse: 66  Resp: 18  Temp: 97.5 F (36.4 C)   Filed Weights   10/09/16 0918  Weight: 294 lb (133.4 kg)    GENERAL:alert, no distress and comfortable SKIN: skin color, texture, turgor are normal, no rashes or significant lesions EYES: normal, Conjunctiva are pink and non-injected, sclera clear OROPHARYNX:no exudate, no erythema and lips, buccal mucosa, and tongue normal  NECK: supple, thyroid normal size, non-tender, without nodularity LYMPH:  no palpable lymphadenopathy in the cervical, axillary or inguinal LUNGS: clear to auscultation and percussion with normal breathing effort HEART: regular rate & rhythm and no murmurs and no lower extremity edema ABDOMEN:abdomen soft, non-tender and normal bowel sounds MUSCULOSKELETAL:no cyanosis of digits and no clubbing  NEURO: alert & oriented x 3 with fluent speech, no focal motor/sensory deficits EXTREMITIES: No lower extremity edema  LABORATORY DATA:  I have reviewed the data as listed   Chemistry      Component Value Date/Time   NA 136 09/27/2016 1453   K 4.8 09/27/2016 1453   CL 107 10/24/2015 0431   CO2 26 09/27/2016 1453   BUN 25.2 09/27/2016 1453   CREATININE 1.1 09/27/2016 1453      Component Value Date/Time   CALCIUM 9.9 09/27/2016 1453   ALKPHOS 147 09/27/2016 1453   AST 36 (H) 09/27/2016 1453   ALT 26 09/27/2016 1453   BILITOT 0.30 09/27/2016 1453       Lab Results  Component Value Date   WBC 3.2 (L) 09/27/2016   HGB 11.0 (L) 09/27/2016   HCT 33.4 (L) 09/27/2016   MCV 99.4 09/27/2016   PLT 280 09/27/2016   NEUTROABS 1.8 09/27/2016    ASSESSMENT & PLAN:  Breast cancer of upper-outer quadrant of right female breast ([facility]) 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 Followed by 12 cycles of weekly Abraxane completed 07/19/2014 S/P Mastectomy 09/22/14: Bilateral mastectomies: Right: IDC Grade 2, 2.7cm, LVI, PNI, 6/8 LN Positive, Er 90%, PR 5% T2N2 (Stage IIIA) Nonhealing right breast wound: finally healed Adjuvant radiation therapy started 03/22/2015 to complete 05/11/2015  Treatment Summary: antiestrogen therapy with anastrozole 1 mg daily Jan 2017- April 2017 (stopped when she got diagnosed with metastatic disease) Anastrozole toxicities: Hot flashes for which she is prescribed gabapentin 300 twice a day  PET/CT scan 08/08/2015: Multifocal areas of increased hypermetabolic is in new/progressive but without definite underlying lesion on CT scan suspicious for metastases, T5, T7, left sacrum, left iliac bone; 13 mm gastrohepatic node SUV 5.2 Suspicious for nodal metastases. ----------------------------------------------------------------------------------------------------------------------------------------------------------------- Treatment plan:Ibrance with Faslodex. Started 08/12/2015 ; discontinued 10/09/2016 due to progression Bone metastases XGeva once every 3 months (started 01/12/2016)  Pulmonary emboli: Currently on xarelto  10/02/2016 CT CAP: New hepatic metastases with the right and left hepatic lobes. New lung metastases. Stable sclerotic metastases within the pelvis and spine   Recommendation: 1. Biopsy of the metastases 2. Foundation 1 testing 3. Exemestane with Everolimus versus targeted therapy based on mutation test versus immunotherapy if she has MSI high   I instructed her to discontinue Palbociclib at this time. Return to clinic after biopsies to review the results on 10/30/2016..    I spent 45 minutes talking to the patient of which more than half was spent in counseling and coordination of care.  Orders Placed This Encounter  Procedures  . US Biopsy    Please send tissue for  Foundation 1 testing, PD 1 testing, MSI testing along breast prognostic panel    Standing Status:   Future    Standing Expiration Date:   12/09/2017    Order Specific Question:   Lab orders requested (DO NOT place separate lab orders, these will be automatically ordered during procedure specimen collection):    Answer:   Cytology - Non Pap    Order Specific Question:   Reason for Exam (SYMPTOM  OR DIAGNOSIS REQUIRED)    Answer:   Metastatic breast cancer with liver metastases    Order Specific Question:   Preferred imaging location?    Answer:   [facility]    Comments:   D/W Dr Grace Isaac   The patient has a good understanding of the overall plan. she agrees with it. she will call with any  problems that may develop before the next visit here.   Rulon Eisenmenger, MD 10/09/16

## 2016-10-09 NOTE — Assessment & Plan Note (Signed)
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 Followed by 12 cycles of weekly Abraxane completed 07/19/2014 S/P Mastectomy 09/22/14: Bilateral mastectomies: Right: IDC Grade 2, 2.7cm, LVI, PNI, 6/8 LN Positive, Er 90%, PR 5% T2N2 (Stage IIIA) Nonhealing right breast wound: finally healed Adjuvant radiation therapy started 03/22/2015 to complete 05/11/2015  Treatment Summary: antiestrogen therapy with anastrozole 1 mg daily Jan 2017- April 2017 (stopped when she got diagnosed with metastatic disease) Anastrozole toxicities: Hot flashes for which she is prescribed gabapentin 300 twice a day  PET/CT scan 08/08/2015: Multifocal areas of increased hypermetabolic is in new/progressive but without definite underlying lesion on CT scan suspicious for metastases, T5, T7, left sacrum, left iliac bone; 13 mm gastrohepatic node SUV 5.2 Suspicious for nodal metastases. ----------------------------------------------------------------------------------------------------------------------------------------------------------------- Met diseaseTreatment plan:Ibrance with Faslodex. Started 08/12/2015 ; today cycle 15 Ibrance toxicities:  1. Mild fatigue 2. Mild nausea Patient got a grant to pay for [employer]. Blood work today was reviewed [place] is 2200.   Bone metastases XGeva once every 3 months (started 01/12/2016)  Pulmonary emboli: Currently on xarelto  10/02/2016 CT CAP: New hepatic metastases with the right and left hepatic lobes. Stable sclerotic metastases within the pelvis and spine   Recommendation: 1. Biopsy of the metastases 2. Foundation 1 testing 3. Exemestane with Everolimus   Return to clinic after biopsies to review the results.

## 2016-10-11 ENCOUNTER — Telehealth: Payer: Self-pay | Admitting: Hematology and Oncology

## 2016-10-11 NOTE — Telephone Encounter (Signed)
This note is to state that it is medically reasonable to stop Xeloda 1-2 days prior to the liver biopsy. Please resume Xarelto the day after biopsy is complete.

## 2016-10-12 ENCOUNTER — Other Ambulatory Visit: Payer: Self-pay | Admitting: General Surgery

## 2016-10-12 ENCOUNTER — Other Ambulatory Visit: Payer: Self-pay | Admitting: Physician Assistant

## 2016-10-12 ENCOUNTER — Ambulatory Visit: Payer: Medicare Other | Admitting: Hematology and Oncology

## 2016-10-15 ENCOUNTER — Ambulatory Visit (HOSPITAL_COMMUNITY)
Admission: RE | Admit: 2016-10-15 | Discharge: 2016-10-15 | Disposition: A | Discharge status: Home / Self Care | Payer: Medicare Other | Source: Ambulatory Visit | Attending: Hematology and Oncology | Admitting: Hematology and Oncology

## 2016-10-15 ENCOUNTER — Encounter (HOSPITAL_COMMUNITY): Payer: Self-pay

## 2016-10-15 DIAGNOSIS — Z79899 Other long term (current) drug therapy: Secondary | ICD-10-CM | POA: Insufficient documentation

## 2016-10-15 DIAGNOSIS — C787 Secondary malignant neoplasm of liver and intrahepatic bile duct: Secondary | ICD-10-CM | POA: Insufficient documentation

## 2016-10-15 DIAGNOSIS — I10 Essential (primary) hypertension: Secondary | ICD-10-CM | POA: Diagnosis not present

## 2016-10-15 DIAGNOSIS — K7689 Other specified diseases of liver: Secondary | ICD-10-CM | POA: Diagnosis not present

## 2016-10-15 DIAGNOSIS — Z794 Long term (current) use of insulin: Secondary | ICD-10-CM | POA: Diagnosis not present

## 2016-10-15 DIAGNOSIS — E785 Hyperlipidemia, unspecified: Secondary | ICD-10-CM | POA: Diagnosis not present

## 2016-10-15 DIAGNOSIS — Z7901 Long term (current) use of anticoagulants: Secondary | ICD-10-CM | POA: Insufficient documentation

## 2016-10-15 DIAGNOSIS — E1142 Type 2 diabetes mellitus with diabetic polyneuropathy: Secondary | ICD-10-CM | POA: Diagnosis not present

## 2016-10-15 DIAGNOSIS — C50411 Malignant neoplasm of upper-outer quadrant of right female breast: Secondary | ICD-10-CM | POA: Insufficient documentation

## 2016-10-15 DIAGNOSIS — Z880 Allergy status to penicillin: Secondary | ICD-10-CM | POA: Insufficient documentation

## 2016-10-15 DIAGNOSIS — Z170 Estrogen receptor positive status [ER+]: Secondary | ICD-10-CM | POA: Diagnosis not present

## 2016-10-15 DIAGNOSIS — Z87891 Personal history of nicotine dependence: Secondary | ICD-10-CM | POA: Diagnosis not present

## 2016-10-15 DIAGNOSIS — C50919 Malignant neoplasm of unspecified site of unspecified female breast: Secondary | ICD-10-CM | POA: Diagnosis not present

## 2016-10-15 LAB — CBC
HCT: 33.7 % — ABNORMAL LOW (ref 36.0–46.0)
Hemoglobin: 11.1 g/dL — ABNORMAL LOW (ref 12.0–15.0)
MCH: 32.3 pg (ref 26.0–34.0)
MCHC: 32.9 g/dL (ref 30.0–36.0)
MCV: 98 fL (ref 78.0–100.0)
PLATELETS: 215 10*3/uL (ref 150–400)
RBC: 3.44 MIL/uL — ABNORMAL LOW (ref 3.87–5.11)
RDW: 15.5 % (ref 11.5–15.5)
WBC: 4.2 10*3/uL (ref 4.0–10.5)

## 2016-10-15 LAB — PROTIME-INR
INR: 1.1
Prothrombin Time: 14.3 seconds (ref 11.4–15.2)

## 2016-10-15 LAB — GLUCOSE, CAPILLARY: GLUCOSE-CAPILLARY: 162 mg/dL — AB (ref 65–99)

## 2016-10-15 LAB — APTT: APTT: 39 s — AB (ref 24–36)

## 2016-10-15 MED ORDER — LIDOCAINE HCL 1 % IJ SOLN
INTRAMUSCULAR | Status: AC
  Filled 2016-10-15: qty 20

## 2016-10-15 MED ORDER — HEPARIN SOD (PORK) LOCK FLUSH 100 UNIT/ML IV SOLN
500.0000 [IU] | INTRAVENOUS | Status: AC | PRN
  Administered 2016-10-15: 500 [IU]

## 2016-10-15 MED ORDER — FENTANYL CITRATE (PF) 100 MCG/2ML IJ SOLN
INTRAMUSCULAR | Status: AC | PRN
  Administered 2016-10-15: 25 ug via INTRAVENOUS
  Administered 2016-10-15: 50 ug via INTRAVENOUS

## 2016-10-15 MED ORDER — MIDAZOLAM HCL 2 MG/2ML IJ SOLN
INTRAMUSCULAR | Status: AC | PRN
  Administered 2016-10-15: 1 mg via INTRAVENOUS
  Administered 2016-10-15: 0.5 mg via INTRAVENOUS

## 2016-10-15 MED ORDER — FENTANYL CITRATE (PF) 100 MCG/2ML IJ SOLN
INTRAMUSCULAR | Status: AC
  Filled 2016-10-15: qty 2

## 2016-10-15 MED ORDER — GELATIN ABSORBABLE 12-7 MM EX MISC
CUTANEOUS | Status: AC
  Filled 2016-10-15: qty 1

## 2016-10-15 MED ORDER — SODIUM CHLORIDE 0.9 % IV SOLN: INTRAVENOUS | Status: DC

## 2016-10-15 MED ORDER — MIDAZOLAM HCL 2 MG/2ML IJ SOLN
INTRAMUSCULAR | Status: AC
  Filled 2016-10-15: qty 2

## 2016-10-15 NOTE — Sedation Documentation (Signed)
Patient is resting comfortably. 

## 2016-10-15 NOTE — Procedures (Signed)
Interventional Radiology Procedure Note  Procedure: US guided core biopsy of liver lesion  Complications: None  Estimated Blood Loss: < 10 mL  18 G core biopsy x 3 via 17 G needle of lesion in lateral left lobe of liver.    Venetia Night. Kathlene Cote, M.D Pager:  [phone]

## 2016-10-15 NOTE — H&P (Signed)
Chief Complaint: Liver lesion  Referring Physician(s): [place]  Supervising Physician: Aletta Edouard  Patient Status: [facility] - Out-pt  History of Present Illness: Tonya Alvarez is a 71 y.o. female with history of breast cancer which was originally diagnosed in  October of 2015.  She was treated with Neo-Adjuvant Chemotherapy until March 2016.  She underwent bilateral mastectomy in May of 2016.  She had radiation therapy as well starting in November of 2016.  CT scan of the abdomen done 10/02/2016 showed progression of disease with hepatic metastasis to the right and left lobes.  She is here today for biopsy to confirm mets.  She is NPO x 6 hours.  She takes Xarelto for history of PE and she has held this since Friday.   Past Medical History:  Diagnosis Date  . Arthritis    "knees, ankles" (09/22/2014  . Breast cancer ([place]) 02/04/14   right breast  . Cancer of right breast ([place])   . Chronic lower back pain   . Complication of anesthesia    O2 SAT  DROP   . DDD (degenerative disc disease), cervical   . DDD (degenerative disc disease), lumbosacral   . DDD (degenerative disc disease), thoracolumbar   . Diabetes mellitus without complication ([place])   . Diabetic peripheral neuropathy ([place])   . Heart murmur    mitral valve  . Hyperlipidemia   . Hypertension   . Mitral regurgitation   . Neuromuscular disorder ([place])   . Sinus headache    "seasonal"  . Type II diabetes mellitus ([place])     Past Surgical History:  Procedure Laterality Date  . BREAST BIOPSY Right 02/2014  . JOINT REPLACEMENT    . MASTECTOMY COMPLETE / SIMPLE Left 09/22/2014  . MASTECTOMY MODIFIED RADICAL Right 09/22/2014  . MASTECTOMY MODIFIED RADICAL Right 09/22/2014   Procedure:  RIGHT MASTECTOMY MODIFIED RADICAL;  Surgeon: Coralie Keens, MD;  Location: [place];  Service: General;  Laterality: Right;  . PORTACATH PLACEMENT Right 02/23/2014   power port, tip cavo-atrial junction  . SIMPLE  MASTECTOMY WITH AXILLARY SENTINEL NODE BIOPSY Left 09/22/2014   Procedure: LEFT SIMPLE MASTECTOMY;  Surgeon: Coralie Keens, MD;  Location: [place];  Service: General;  Laterality: Left;  . TONSILLECTOMY  1955  . TOTAL KNEE ARTHROPLASTY Left ~ 2009  . TUBAL LIGATION  1980    Allergies: Amoxicillin; Hydrocodone; and Lisinopril  Medications: Prior to Admission medications   Medication Sig Start Date End Date Taking? Authorizing Provider  calcium-vitamin D (OSCAL WITH D) 500-200 MG-UNIT per tablet Take 1 tablet by mouth daily.   Yes [provider]  clindamycin (CLEOCIN) 300 MG capsule Take 900 mg by mouth See admin instructions. Take 3 capsules (900 mg) by mouth one hour prior to dental appointment - last appointment October 11, 2015   Yes [provider]  fluticasone (FLONASE) 50 MCG/ACT nasal spray Place 1 spray into both nostrils daily as needed for allergies or rhinitis. Patient taking differently: Place 1 spray into both nostrils at bedtime as needed for allergies or rhinitis.  07/26/14  Yes Nicholas Lose, MD  furosemide (LASIX) 40 MG tablet Take 20-40 mg by mouth See admin instructions. PT ALTERNATES DOES OF 20 MG DAILY AND 40 MG DAILY   Yes [provider]  gabapentin (NEURONTIN) 300 MG capsule Take 300 mg by mouth 3 (three) times daily.    Yes [provider]  ibuprofen (ADVIL,MOTRIN) 600 MG tablet Take 600 mg by mouth every 12 (twelve) hours.  Yes [provider]  insulin lispro protamine-lispro (HUMALOG 75/25 MIX) (75-25) 100 UNIT/ML SUSP injection Inject 10-50 Units into the skin See admin instructions. Inject 50 units subcutaneously with breakfast and supper, inject 10 mls after lunch if CBG >150   Yes [provider]  lidocaine-prilocaine (EMLA) cream Apply 1 application topically as needed. Patient taking differently: Apply 1 application topically as needed (prior to flushing of port (every 4-6 weeks)).  04/09/14  Yes Boelter, Genelle Gather, NP  Liniments (SALONPAS PAIN RELIEF PATCH EX) Place 1 patch onto the skin daily as needed (PAIN).   Yes [provider]  losartan (COZAAR) 50 MG tablet Take 50 mg by mouth daily.    Yes [provider]  metFORMIN (GLUCOPHAGE-XR) 500 MG 24 hr tablet Take 2 tablets (1,000 mg total) by mouth 2 (two) times daily. 10/31/15  Yes Hosie Poisson, MD  Multiple Vitamin (MULTIVITAMIN WITH MINERALS) TABS tablet Take 1 tablet by mouth at bedtime.   Yes [provider]  simvastatin (ZOCOR) 20 MG tablet Take 20 mg by mouth at bedtime. 09/28/15  Yes [provider]  XARELTO 20 MG TABS tablet TAKE 1 TABLET BY MOUTH  DAILY WITH SUPPER 08/23/16  Yes Nicholas Lose, MD     Family History  Problem Relation Age of Onset  . Cancer Father        lung cancer  . Cancer Maternal Aunt        Kidney  . Hypertension Maternal Uncle     Social History   Social History  . Marital status: Widowed    Spouse name: N/A  . Number of children: N/A  . Years of education: N/A   Social History Main Topics  . Smoking status: Former Smoker    Packs/day: 0.75    Years: 30.00    Types: Cigarettes    Quit date: 05/07/2013  . Smokeless tobacco: Never Used  . Alcohol use No     Comment: 09/22/2014 "I'll drink a few times/yr"  . Drug use: No  . Sexual activity: Yes   Other Topics Concern  . None   Social History Narrative  . None   Review of Systems: A 12 point ROS discussed  Review of Systems  Constitutional: Positive for fatigue.  HENT: Negative.   Respiratory: Negative.   Cardiovascular: Negative.   Gastrointestinal: Negative.   Genitourinary: Negative.   Musculoskeletal: Positive for arthralgias.  Skin: Negative.   Neurological: Negative.   Hematological: Negative.   Psychiatric/Behavioral: Negative.     Vital Signs: BP (!) 176/82   Pulse 62   Temp 97.5 F (36.4 C)   Resp 20   Ht 5\' 9"  (1.753 m)   Wt 294 lb (133.4 kg)   SpO2 95%   BMI 43.42 kg/m   Physical Exam   Constitutional: She is oriented to person, place, and time.  Obese, NAD  HENT:  Head: Normocephalic and atraumatic.  Eyes: EOM are normal.  Neck: Normal range of motion.  Cardiovascular: Normal rate, regular rhythm and normal heart sounds.   Pulmonary/Chest: Effort normal and breath sounds normal. No respiratory distress. She has no wheezes.  Abdominal: Soft. She exhibits no distension. There is no tenderness.  Musculoskeletal: Normal range of motion.  Neurological: She is alert and oriented to person, place, and time.  Skin: Skin is warm and dry.  Psychiatric: She has a normal mood and affect. Her behavior is normal. Judgment and thought content normal.  Vitals reviewed.   Mallampati Score:  MD  Evaluation Airway: WNL Heart: WNL Abdomen: WNL Chest/ Lungs: WNL ASA  Classification: 3 Mallampati/Airway Score: One  Imaging: Ct Chest W Contrast  Result Date: 10/02/2016 CLINICAL DATA:  Breast cancer restaging. EXAM: CT CHEST, ABDOMEN, AND PELVIS WITH CONTRAST TECHNIQUE: Multidetector CT imaging of the chest, abdomen and pelvis was performed following the standard protocol during bolus administration of intravenous contrast. CONTRAST:  100 mL Omnipaque COMPARISON:  CT 04/09/2016, PET-CT scan 08/08/2015 FINDINGS: CT CHEST FINDINGS Cardiovascular: Coronary artery calcification and aortic atherosclerotic calcification. Mediastinum/Nodes: Port in the RIGHT chest wall. RIGHT axillary nodal dissection with linear scarring. Post RIGHT mastectomy. Subglandular implant post LEFT mastectomy. No supraclavicular adenopathy. Nodules within thyroid gland unchanged. No internal mammary adenopathy. No mediastinal hilar adenopathy. Lungs/Pleura: 4 mm nodule at the RIGHT lung base not seen on comparison exam (image 25, series 4). No additional new pulmonary nodules identified. Musculoskeletal: No aggressive osseous lesion. CT ABDOMEN AND PELVIS FINDINGS Hepatobiliary: New low-density lesions within the LEFT  and RIGHT hepatic lobes. Example lesion in segment 4B measures approximately 4.3 cm (image 65, series 2). Example lesion in the posterior RIGHT hepatic lobe (segment 7) measures 3.7 cm (image 46, series 2). approximately 8 lesions per lobe. Gallbladder normal. Pancreas: Pancreas is normal. No ductal dilatation. No pancreatic inflammation. Spleen: Normal spleen Adrenals/urinary tract: RIGHT adrenal gland is enlarged to 30 mm unchanged from prior. Lesion demonstrates low Hounsfield units consistent benign adenoma. The kidneys are unchanged. Nodule lesion adjacent to the lower pole of the RIGHT kidney measures 20 mm compared with 20 mm on prior. This lesion is not hypermetabolic comparison PET-CT scan. Ureters and bladder normal. Stomach/Bowel: Stomach, small bowel, appendix, and cecum are normal. The colon and rectosigmoid colon are normal. Vascular/Lymphatic: Abdominal aorta is normal caliber with atherosclerotic calcification. There is no retroperitoneal or periportal lymphadenopathy. No pelvic lymphadenopathy. Reproductive: Uterus and ovaries normal Other: No peritoneal metastasis Musculoskeletal: The sclerotic lesions within the sacrum and spine are unchanged. IMPRESSION: Chest Impression: 1. Single new small pulmonary nodule in the RIGHT lower lobe. Recommend attention on follow-up. Abdomen / Pelvis Impression: 1. Unfortunately, new HEPATIC METASTASIS within LEFT and RIGHT hepatic lobe. 2. Stable sclerotic metastasis within the pelvis and spine. Electronically Signed   By: Suzy Bouchard M.D.   On: 10/02/2016 20:37   Ct Abdomen Pelvis W Contrast  Result Date: 10/02/2016 CLINICAL DATA:  Breast cancer restaging. EXAM: CT CHEST, ABDOMEN, AND PELVIS WITH CONTRAST TECHNIQUE: Multidetector CT imaging of the chest, abdomen and pelvis was performed following the standard protocol during bolus administration of intravenous contrast. CONTRAST:  100 mL Omnipaque COMPARISON:  CT 04/09/2016, PET-CT scan 08/08/2015  FINDINGS: CT CHEST FINDINGS Cardiovascular: Coronary artery calcification and aortic atherosclerotic calcification. Mediastinum/Nodes: Port in the RIGHT chest wall. RIGHT axillary nodal dissection with linear scarring. Post RIGHT mastectomy. Subglandular implant post LEFT mastectomy. No supraclavicular adenopathy. Nodules within thyroid gland unchanged. No internal mammary adenopathy. No mediastinal hilar adenopathy. Lungs/Pleura: 4 mm nodule at the RIGHT lung base not seen on comparison exam (image 25, series 4). No additional new pulmonary nodules identified. Musculoskeletal: No aggressive osseous lesion. CT ABDOMEN AND PELVIS FINDINGS Hepatobiliary: New low-density lesions within the LEFT and RIGHT hepatic lobes. Example lesion in segment 4B measures approximately 4.3 cm (image 65, series 2). Example lesion in the posterior RIGHT hepatic lobe (segment 7) measures 3.7 cm (image 46, series 2). approximately 8 lesions per lobe. Gallbladder normal. Pancreas: Pancreas is normal. No ductal dilatation. No pancreatic inflammation. Spleen: Normal spleen Adrenals/urinary tract: RIGHT adrenal gland is  enlarged to 30 mm unchanged from prior. Lesion demonstrates low Hounsfield units consistent benign adenoma. The kidneys are unchanged. Nodule lesion adjacent to the lower pole of the RIGHT kidney measures 20 mm compared with 20 mm on prior. This lesion is not hypermetabolic comparison PET-CT scan. Ureters and bladder normal. Stomach/Bowel: Stomach, small bowel, appendix, and cecum are normal. The colon and rectosigmoid colon are normal. Vascular/Lymphatic: Abdominal aorta is normal caliber with atherosclerotic calcification. There is no retroperitoneal or periportal lymphadenopathy. No pelvic lymphadenopathy. Reproductive: Uterus and ovaries normal Other: No peritoneal metastasis Musculoskeletal: The sclerotic lesions within the sacrum and spine are unchanged. IMPRESSION: Chest Impression: 1. Single new small pulmonary nodule  in the RIGHT lower lobe. Recommend attention on follow-up. Abdomen / Pelvis Impression: 1. Unfortunately, new HEPATIC METASTASIS within LEFT and RIGHT hepatic lobe. 2. Stable sclerotic metastasis within the pelvis and spine. Electronically Signed   By: Suzy Bouchard M.D.   On: 10/02/2016 20:37    Labs:  CBC:  Recent Labs  07/05/16 1401 08/02/16 1453 08/30/16 1457 09/27/16 1453  WBC 3.2* 2.9* 3.2* 3.2*  HGB 11.2* 10.7* 11.5* 11.0*  HCT 32.7* 32.1* 33.8* 33.4*  PLT 283 245 291 280    COAGS:  Recent Labs  10/22/15 2039  INR 1.19  APTT 114*    BMP:  Recent Labs  10/22/15 1307 10/23/15 0212 10/24/15 0431  07/05/16 1402 08/02/16 1453 08/30/16 1458 09/27/16 1453  NA 137 138 137  < > 140 139 143 136  K 4.6 3.9 4.0  < > 4.3 4.5 5.4 No visable hemolysis* 4.8  CL 105 107 107  --   --   --   --   --   CO2 24 25 24   < > 28 25 27 26   GLUCOSE 170* 97 122*  < > 156* 271* 152* 175*  BUN 18 12 10   < > 16.8 21.1 25.4 25.2  CALCIUM 9.1 8.6* 8.7*  < > 9.6 9.3 10.2 9.9  CREATININE 0.96 0.77 0.70  < > 0.8 1.0 1.3* 1.1  GFRNONAA 60* >60 >60  --   --   --   --   --   GFRAA >60 >60 >60  --   --   --   --   --   < > = values in this interval not displayed.  LIVER FUNCTION TESTS:  Recent Labs  07/05/16 1402 08/02/16 1453 08/30/16 1458 09/27/16 1453  BILITOT 0.30 0.38 0.26 0.30  AST 22 24 30  36*  ALT 17 20 23 26   ALKPHOS 93 107 120 147  PROT 6.6 6.6 7.0 6.7  ALBUMIN 3.5 3.4* 3.6 3.4*    TUMOR MARKERS: No results for input(s): AFPTM, CEA, CA199, CHROMGRNA in the last 8760 hours.  Assessment and Plan:  History of breast cancer with apparent metastatic disease to the liver.  Will proceed with image guided biopsy today.  If metastatic disease is confirmed, she is a potential candidate for Y-90 per Dr. Pascal Lux.  Risks and Benefits discussed with the patient including, but not limited to bleeding, infection, damage to adjacent structures or low yield requiring additional  tests.  All of the patient's questions were answered, patient is agreeable to proceed. Consent signed and in chart.  Thank you for this interesting consult.  I greatly enjoyed meeting Tonya Alvarez and look forward to participating in their care.  A copy of this report was sent to the requesting provider on this date.  Electronically Signed: Murrell Redden, PA-C  10/15/2016, 11:55 AM   I spent a total of  30 Minutes  in face to face in clinical consultation, greater than 50% of which was counseling/coordinating care for liver biopsy

## 2016-10-15 NOTE — Sedation Documentation (Signed)
Patient denies pain and is resting comfortably.  

## 2016-10-15 NOTE — Discharge Instructions (Signed)
Liver Biopsy, Care After °These instructions give you information on caring for yourself after your procedure. Your doctor may also give you more specific instructions. Call your doctor if you have any problems or questions after your procedure. °Follow these instructions at home: °· Rest at home for 1-2 days or as told by your doctor. °· Have someone stay with you for at least 24 hours. °· Do not do these things in the first 24 hours: °? Drive. °? Use machinery. °? Take care of other people. °? Sign legal documents. °? Take a bath or shower. °· There are many different ways to close and cover a cut (incision). For example, a cut can be closed with stitches, skin glue, or adhesive strips. Follow your doctor's instructions on: °? Taking care of your cut. °? Changing and removing your bandage (dressing). °? Removing whatever was used to close your cut. °· Do not drink alcohol in the first week. °· Do not lift more than 5 pounds or play contact sports for the first 2 weeks. °· Take medicines only as told by your doctor. For 1 week, do not take medicine that has aspirin in it or medicines like ibuprofen. °· Get your test results. °Contact a doctor if: °· A cut bleeds and leaves more than just a small spot of blood. °· A cut is red, puffs up (swells), or hurts more than before. °· Fluid or something else comes from a cut. °· A cut smells bad. °· You have a fever or chills. °Get help right away if: °· You have swelling, bloating, or pain in your belly (abdomen). °· You get dizzy or faint. °· You have a rash. °· You feel sick to your stomach (nauseous) or throw up (vomit). °· You have trouble breathing, feel short of breath, or feel faint. °· Your chest hurts. °· You have problems talking or seeing. °· You have trouble balancing or moving your arms or legs. °This information is not intended to replace advice given to you by your health care provider. Make sure you discuss any questions you have with your health care  provider. °Document Released: 01/31/2008 Document Revised: 09/29/2015 Document Reviewed: 06/19/2013 °Elsevier Interactive Patient Education © 2018 [employer] ° °

## 2016-10-15 NOTE — Progress Notes (Signed)
Port a cath accessed by IV therapy per PA order.

## 2016-10-17 NOTE — Progress Notes (Signed)
Called pathology to request foundation one to be sent per Dr.Gudena request. Spoke with Varney Biles from pathology and will get it processed.

## 2016-10-29 ENCOUNTER — Other Ambulatory Visit: Payer: Self-pay | Admitting: Hematology and Oncology

## 2016-10-30 ENCOUNTER — Ambulatory Visit (HOSPITAL_BASED_OUTPATIENT_CLINIC_OR_DEPARTMENT_OTHER): Payer: Medicare Other

## 2016-10-30 ENCOUNTER — Ambulatory Visit (HOSPITAL_BASED_OUTPATIENT_CLINIC_OR_DEPARTMENT_OTHER): Payer: Medicare Other | Admitting: Hematology and Oncology

## 2016-10-30 ENCOUNTER — Ambulatory Visit: Payer: Medicare Other

## 2016-10-30 ENCOUNTER — Other Ambulatory Visit (HOSPITAL_BASED_OUTPATIENT_CLINIC_OR_DEPARTMENT_OTHER): Payer: Medicare Other

## 2016-10-30 ENCOUNTER — Encounter: Payer: Self-pay | Admitting: Hematology and Oncology

## 2016-10-30 ENCOUNTER — Telehealth: Payer: Self-pay | Admitting: Hematology and Oncology

## 2016-10-30 DIAGNOSIS — C50411 Malignant neoplasm of upper-outer quadrant of right female breast: Secondary | ICD-10-CM

## 2016-10-30 DIAGNOSIS — Z5111 Encounter for antineoplastic chemotherapy: Secondary | ICD-10-CM | POA: Diagnosis not present

## 2016-10-30 DIAGNOSIS — Z95828 Presence of other vascular implants and grafts: Secondary | ICD-10-CM

## 2016-10-30 DIAGNOSIS — I2699 Other pulmonary embolism without acute cor pulmonale: Secondary | ICD-10-CM | POA: Diagnosis not present

## 2016-10-30 DIAGNOSIS — C7951 Secondary malignant neoplasm of bone: Secondary | ICD-10-CM

## 2016-10-30 DIAGNOSIS — Z170 Estrogen receptor positive status [ER+]: Secondary | ICD-10-CM | POA: Diagnosis not present

## 2016-10-30 DIAGNOSIS — Z452 Encounter for adjustment and management of vascular access device: Secondary | ICD-10-CM

## 2016-10-30 DIAGNOSIS — Z7901 Long term (current) use of anticoagulants: Secondary | ICD-10-CM

## 2016-10-30 LAB — COMPREHENSIVE METABOLIC PANEL
ALT: 62 U/L — AB (ref 0–55)
ANION GAP: 10 meq/L (ref 3–11)
AST: 76 U/L — ABNORMAL HIGH (ref 5–34)
Albumin: 3 g/dL — ABNORMAL LOW (ref 3.5–5.0)
Alkaline Phosphatase: 280 U/L — ABNORMAL HIGH (ref 40–150)
BILIRUBIN TOTAL: 0.61 mg/dL (ref 0.20–1.20)
BUN: 17.7 mg/dL (ref 7.0–26.0)
CALCIUM: 10.2 mg/dL (ref 8.4–10.4)
CHLORIDE: 106 meq/L (ref 98–109)
CO2: 25 mEq/L (ref 22–29)
CREATININE: 0.9 mg/dL (ref 0.6–1.1)
EGFR: 66 mL/min/{1.73_m2} — ABNORMAL LOW (ref >90)
Glucose: 129 mg/dl (ref 70–140)
Potassium: 4.4 mEq/L (ref 3.5–5.1)
Sodium: 141 mEq/L (ref 136–145)
Total Protein: 6.5 g/dL (ref 6.4–8.3)

## 2016-10-30 LAB — CBC WITH DIFFERENTIAL/PLATELET
BASO%: 1.2 % (ref 0.0–2.0)
BASOS ABS: 0.1 10*3/uL (ref 0.0–0.1)
EOS ABS: 0.1 10*3/uL (ref 0.0–0.5)
EOS%: 1.5 % (ref 0.0–7.0)
HEMATOCRIT: 35.6 % (ref 34.8–46.6)
HGB: 11.8 g/dL (ref 11.6–15.9)
LYMPH#: 1 10*3/uL (ref 0.9–3.3)
LYMPH%: 17 % (ref 14.0–49.7)
MCH: 32.2 pg (ref 25.1–34.0)
MCHC: 33.1 g/dL (ref 31.5–36.0)
MCV: 97.1 fL (ref 79.5–101.0)
MONO#: 0.9 10*3/uL (ref 0.1–0.9)
MONO%: 15.1 % — ABNORMAL HIGH (ref 0.0–14.0)
NEUT#: 4 10*3/uL (ref 1.5–6.5)
NEUT%: 65.2 % (ref 38.4–76.8)
PLATELETS: 261 10*3/uL (ref 145–400)
RBC: 3.67 10*6/uL — ABNORMAL LOW (ref 3.70–5.45)
RDW: 16.7 % — ABNORMAL HIGH (ref 11.2–14.5)
WBC: 6.1 10*3/uL (ref 3.9–10.3)

## 2016-10-30 MED ORDER — SODIUM CHLORIDE 0.9 % IJ SOLN
10.0000 mL | INTRAMUSCULAR | Status: DC | PRN
  Administered 2016-10-30: 10 mL via INTRAVENOUS
  Filled 2016-10-30: qty 10

## 2016-10-30 MED ORDER — DENOSUMAB 120 MG/1.7ML ~~LOC~~ SOLN
120.0000 mg | Freq: Once | SUBCUTANEOUS | Status: AC
  Administered 2016-10-30: 120 mg via SUBCUTANEOUS
  Filled 2016-10-30: qty 1.7

## 2016-10-30 MED ORDER — RIVAROXABAN 20 MG PO TABS: ORAL_TABLET | ORAL | 11 refills | Status: AC

## 2016-10-30 MED ORDER — HEPARIN SOD (PORK) LOCK FLUSH 100 UNIT/ML IV SOLN
500.0000 [IU] | Freq: Once | INTRAVENOUS | Status: AC | PRN
  Administered 2016-10-30: 500 [IU] via INTRAVENOUS
  Filled 2016-10-30: qty 5

## 2016-10-30 MED ORDER — FULVESTRANT 250 MG/5ML IM SOLN
500.0000 mg | Freq: Once | INTRAMUSCULAR | Status: DC
  Administered 2016-10-30: 500 mg via INTRAMUSCULAR
  Filled 2016-10-30: qty 10

## 2016-10-30 NOTE — Progress Notes (Signed)
Patient Care Team: Jacelyn Pi, MD as PCP - General (Endocrinology)  DIAGNOSIS:  Encounter Diagnosis  Name Primary?  . Malignant neoplasm of upper-outer quadrant of right breast in female, estrogen receptor positive ([place])     SUMMARY OF ONCOLOGIC HISTORY:   Breast cancer of upper-outer quadrant of right female breast ([place])   02/02/2014 Mammogram    Right breast: 5.8 cm irregular high-density solid mass suggestive of malignancy; left breast next millimeter internal mammary lymph node      02/04/2014 Initial Biopsy     2 biopsies were performed the right breast and axilla: Grade 2 invasive ductal carcinoma ER/PR positive HER-2 negative Ki-67 20% to 47%: Biopsy of right humerus metastatic breast cancer estrogen positive      02/17/2014 PET scan    Hypermetabolic right breast cancer and right axillary lymph nodes subpectoral lymph nodes indeterminate right middle lobe pulmonary nodule and hypermetabolic proximal right humerus lesion      03/05/2014 - 07/19/2014 Neo-Adjuvant Chemotherapy    Dose dense Adriamycin and Cytoxan x4 cycles followed by Abraxane weekly x12      07/26/2014 Breast MRI    Multifocal breast cancer decreased in size, retroareolar 13 mm now 11 mm, middle third 22 mm now 18 mm, 3.9 x 3.9 cm now 3.9 x 2.6 cm, multiple smaller nodules decreased in size, right x-ray lymph node 3.6 cm now 2 cm other lymph nodes are smaller      07/28/2014 - 08/12/2015 Anti-estrogen oral therapy    Anastrozole 1 mg daily      08/02/2014 PET scan    Interval improvement in the right breast mass and axillary/subpectoral lymph nodes and right humerus head. Several subpleural nodules are not visible, 3.4. cm adrenal adenoma      09/22/2014 Surgery    Bilateral mastectomy: Right: IDC Grade 2, 2.7cm, LVI, PNI, 6/8 LN Positive, Er 90%, PR 5% T2N2      03/22/2015 -  Radiation Therapy    adjuvant radiation therapy with Dr. Isidore Moos      08/10/2015 PET scan    Progression of disease with  several new bone metastases, T5, T7, left sacrum, left iliac bone, gastrohepatic lymph node      08/12/2015 - 10/30/2016 Anti-estrogen oral therapy    Faslodex with Brock Bad every 3 months for bone metastases (stopped for progression of liver metastases)       CHIEF COMPLIANT: follow-up to review the liver biopsy results  INTERVAL HISTORY: Tonya Alvarez is a 71 year old with above-mentioned history metastatic breast cancer who underwent a liver biopsy and is here today to discuss the results. He complains of feeling tired.  REVIEW OF SYSTEMS:   Constitutional: Denies fevers, chills or abnormal weight loss Eyes: Denies blurriness of vision Ears, nose, mouth, throat, and face: Denies mucositis or sore throat Respiratory: Denies cough, dyspnea or wheezes Cardiovascular: Denies palpitation, chest discomfort Gastrointestinal:  Denies nausea, heartburn or change in bowel habits Skin: Denies abnormal skin rashes Lymphatics: Denies new lymphadenopathy or easy bruising Neurological:Denies numbness, tingling or new weaknesses Behavioral/Psych: Mood is stable, no new changes  Extremities: No lower extremity edema Breast:  denies any pain or lumps or nodules in either breasts All other systems were reviewed with the patient and are negative.  I have reviewed the past medical history, past surgical history, social history and family history with the patient and they are unchanged from previous note.  ALLERGIES:  is allergic to amoxicillin; hydrocodone; and lisinopril.  MEDICATIONS:  Current Outpatient Prescriptions  Medication Sig Dispense Refill  . anastrozole (ARIMIDEX) 1 MG tablet TAKE 1 TABLET BY MOUTH  DAILY 90 tablet 1  . calcium-vitamin D (OSCAL WITH D) 500-200 MG-UNIT per tablet Take 1 tablet by mouth daily.    . clindamycin (CLEOCIN) 300 MG capsule Take 900 mg by mouth See admin instructions. Take 3 capsules (900 mg) by mouth one hour prior to dental appointment - last  appointment October 11, 2015    . fluticasone (FLONASE) 50 MCG/ACT nasal spray Place 1 spray into both nostrils daily as needed for allergies or rhinitis. (Patient taking differently: Place 1 spray into both nostrils at bedtime as needed for allergies or rhinitis. ) 16 g 0  . furosemide (LASIX) 40 MG tablet Take 20-40 mg by mouth See admin instructions. PT ALTERNATES DOES OF 20 MG DAILY AND 40 MG DAILY    . gabapentin (NEURONTIN) 300 MG capsule Take 300 mg by mouth 3 (three) times daily.     Marland Kitchen ibuprofen (ADVIL,MOTRIN) 600 MG tablet Take 600 mg by mouth every 12 (twelve) hours.    . insulin lispro protamine-lispro (HUMALOG 75/25 MIX) (75-25) 100 UNIT/ML SUSP injection Inject 10-50 Units into the skin See admin instructions. Inject 50 units subcutaneously with breakfast and supper, inject 10 mls after lunch if CBG >150    . lidocaine-prilocaine (EMLA) cream Apply 1 application topically as needed. (Patient taking differently: Apply 1 application topically as needed (prior to flushing of port (every 4-6 weeks)). ) 30 g 6  . Liniments (SALONPAS PAIN RELIEF PATCH EX) Place 1 patch onto the skin daily as needed (PAIN).    Marland Kitchen losartan (COZAAR) 50 MG tablet Take 50 mg by mouth daily.     . metFORMIN (GLUCOPHAGE-XR) 500 MG 24 hr tablet Take 2 tablets (1,000 mg total) by mouth 2 (two) times daily.    . Multiple Vitamin (MULTIVITAMIN WITH MINERALS) TABS tablet Take 1 tablet by mouth at bedtime.    . rivaroxaban (XARELTO) 20 MG TABS tablet TAKE 1 TABLET BY MOUTH  DAILY WITH SUPPER 30 tablet 11  . simvastatin (ZOCOR) 20 MG tablet Take 20 mg by mouth at bedtime.     No current facility-administered medications for this visit.    Facility-Administered Medications Ordered in Other Visits  Medication Dose Route Frequency Provider Last Rate Last Dose  . denosumab (XGEVA) injection 120 mg  120 mg Subcutaneous Once Serena Croissant, MD      . fulvestrant (FASLODEX) injection 500 mg  500 mg Intramuscular Q30 days Serena Croissant, MD   500 mg at 11/10/15 1551    PHYSICAL EXAMINATION: ECOG PERFORMANCE STATUS: 1 - Symptomatic but completely ambulatory  Vitals:   10/30/16 1549  BP: 124/78  Pulse: 60  Resp: 16  Temp: 97.8 F (36.6 C)   Filed Weights   10/30/16 1549  Weight: 292 lb 8 oz (132.7 kg)    GENERAL:alert, no distress and comfortable SKIN: skin color, texture, turgor are normal, no rashes or significant lesions EYES: normal, Conjunctiva are pink and non-injected, sclera clear OROPHARYNX:no exudate, no erythema and lips, buccal mucosa, and tongue normal  NECK: supple, thyroid normal size, non-tender, without nodularity LYMPH:  no palpable lymphadenopathy in the cervical, axillary or inguinal LUNGS: clear to auscultation and percussion with normal breathing effort HEART: regular rate & rhythm and no murmurs and no lower extremity edema ABDOMEN:abdomen soft, non-tender and normal bowel sounds MUSCULOSKELETAL:no cyanosis of digits and no clubbing  NEURO: alert & oriented x 3 with fluent speech, no focal  motor/sensory deficits EXTREMITIES: No lower extremity edema  LABORATORY DATA:  I have reviewed the data as listed   Chemistry      Component Value Date/Time   NA 141 10/30/2016 1513   K 4.4 10/30/2016 1513   CL 107 10/24/2015 0431   CO2 25 10/30/2016 1513   BUN 17.7 10/30/2016 1513   CREATININE 0.9 10/30/2016 1513      Component Value Date/Time   CALCIUM 10.2 10/30/2016 1513   ALKPHOS 280 (H) 10/30/2016 1513   AST 76 (H) 10/30/2016 1513   ALT 62 (H) 10/30/2016 1513   BILITOT 0.61 10/30/2016 1513       Lab Results  Component Value Date   WBC 6.1 10/30/2016   HGB 11.8 10/30/2016   HCT 35.6 10/30/2016   MCV 97.1 10/30/2016   PLT 261 10/30/2016   NEUTROABS 4.0 10/30/2016    ASSESSMENT & PLAN:  Breast cancer of upper-outer quadrant of right female breast ([place]) 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 Followed by 12 cycles of weekly Abraxane completed 07/19/2014 S/P Mastectomy 09/22/14: Bilateral mastectomies: Right: IDC Grade 2, 2.7cm, LVI, PNI, 6/8 LN Positive, Er 90%, PR 5% T2N2 (Stage IIIA) Nonhealing right breast wound: finally healed Adjuvant radiation therapy started 03/22/2015 to complete 05/11/2015  Treatment Summary: antiestrogen therapy with anastrozole 1 mg daily Jan 2017- April 2017 (stopped when she got diagnosed with metastatic disease) Anastrozole toxicities: Hot flashes for which she is prescribed gabapentin 300 twice a day  PET/CT scan 08/08/2015: Multifocal areas of increased hypermetabolic is in new/progressive but without definite underlying lesion on CT scan suspicious for metastases, T5, T7, left sacrum, left iliac bone; 13 mm gastrohepatic node SUV 5.2 Suspicious for nodal metastases. ----------------------------------------------------------------------------------------------------------------------------------------------------------------- Treatment plan:Ibrance with Faslodex. Started 08/12/2015 ; discontinued 10/09/2016 due to progression Bone metastases XGeva once every 3 months (started 01/12/2016)  Pulmonary emboli: Currently on xarelto  10/02/2016 CT CAP: New hepatic metastases with the right and left hepatic lobes. New lung metastases. Stable sclerotic metastases within the pelvis and spine   Liver biopsy 10/15/2016: Metastatic carcinoma consistent with breast primary ER 100% PR 0%, HER-2 negative ratio 1.33  Treatment plan: 1. Obtain BRCA mutation analysis 2. awaiting foundation 1 testing 3. If no mutations are found, start therapy with exemestane with Everolimus I discussed extensively about Everolimus related mucosal toxicities and how to prevent it with dexamethasone swishes.  Return to clinic in 4 weeks for follow-up  I spent 25 minutes talking  to the patient of which more than half was spent in counseling and coordination of care.  No orders of the defined types were placed in this encounter.  The patient has a good understanding of the overall plan. she agrees with it. she will call with any problems that may develop before the next visit here.   Rulon Eisenmenger, MD 10/30/16

## 2016-10-30 NOTE — Assessment & Plan Note (Signed)
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 Followed by 12 cycles of weekly Abraxane completed 07/19/2014 S/P Mastectomy 09/22/14: Bilateral mastectomies: Right: IDC Grade 2, 2.7cm, LVI, PNI, 6/8 LN Positive, Er 90%, PR 5% T2N2 (Stage IIIA) Nonhealing right breast wound: finally healed Adjuvant radiation therapy started 03/22/2015 to complete 05/11/2015  Treatment Summary: antiestrogen therapy with anastrozole 1 mg daily Jan 2017- April 2017 (stopped when she got diagnosed with metastatic disease) Anastrozole toxicities: Hot flashes for which she is prescribed gabapentin 300 twice a day  PET/CT scan 08/08/2015: Multifocal areas of increased hypermetabolic is in new/progressive but without definite underlying lesion on CT scan suspicious for metastases, T5, T7, left sacrum, left iliac bone; 13 mm gastrohepatic node SUV 5.2 Suspicious for nodal metastases. ----------------------------------------------------------------------------------------------------------------------------------------------------------------- Treatment plan:Ibrance with Faslodex. Started 08/12/2015 ; discontinued 10/09/2016 due to progression Bone metastases XGeva once every 3 months (started 01/12/2016)  Pulmonary emboli: Currently on xarelto  10/02/2016 CT CAP: New hepatic metastases with the right and left hepatic lobes. New lung metastases. Stable sclerotic metastases within the pelvis and spine   Liver biopsy 10/15/2016: Metastatic carcinoma consistent with breast primary ER 100% PR 0%, HER-2 negative ratio 1.33  Treatment plan: 1. Obtain BRCA mutation analysis 2. awaiting foundation 1 testing 3. Start therapy with exemestane with Everolimus I discussed extensively about Everolimus related mucosal toxicities and how to prevent it with dexamethasone  swishes.  Return to clinic in 2 weeks for toxicity check and follow-up

## 2016-10-30 NOTE — Telephone Encounter (Signed)
Gave patient avs report and appointments for july

## 2016-10-30 NOTE — Patient Instructions (Signed)
Denosumab injection What is this medicine? DENOSUMAB (den oh sue mab) slows bone breakdown. Prolia is used to treat osteoporosis in women after menopause and in men. Delton See is used to treat a high calcium level due to cancer and to prevent bone fractures and other bone problems caused by multiple myeloma or cancer bone metastases. Delton See is also used to treat giant cell tumor of the bone. This medicine may be used for other purposes; ask your health care provider or pharmacist if you have questions. COMMON BRAND NAME(S): Prolia, XGEVA What should I tell my health care provider before I take this medicine? They need to know if you have any of these conditions: -dental disease -having surgery or tooth extraction -infection -kidney disease -low levels of calcium or Vitamin D in the blood -malnutrition -on hemodialysis -skin conditions or sensitivity -thyroid or parathyroid disease -an unusual reaction to denosumab, other medicines, foods, dyes, or preservatives -pregnant or trying to get pregnant -breast-feeding How should I use this medicine? This medicine is for injection under the skin. It is given by a health care professional in a hospital or clinic setting. If you are getting Prolia, a special MedGuide will be given to you by the pharmacist with each prescription and refill. Be sure to read this information carefully each time. For Prolia, talk to your pediatrician regarding the use of this medicine in children. Special care may be needed. For Delton See, talk to your pediatrician regarding the use of this medicine in children. While this drug may be prescribed for children as young as 13 years for selected conditions, precautions do apply. Overdosage: If you think you have taken too much of this medicine contact a poison control center or emergency room at once. NOTE: This medicine is only for you. Do not share this medicine with others. What if I miss a dose? It is important not to miss your  dose. Call your doctor or health care professional if you are unable to keep an appointment. What may interact with this medicine? Do not take this medicine with any of the following medications: -other medicines containing denosumab This medicine may also interact with the following medications: -medicines that lower your chance of fighting infection -steroid medicines like prednisone or cortisone This list may not describe all possible interactions. Give your health care provider a list of all the medicines, herbs, non-prescription drugs, or dietary supplements you use. Also tell them if you smoke, drink alcohol, or use illegal drugs. Some items may interact with your medicine. What should I watch for while using this medicine? Visit your doctor or health care professional for regular checks on your progress. Your doctor or health care professional may order blood tests and other tests to see how you are doing. Call your doctor or health care professional for advice if you get a fever, chills or sore throat, or other symptoms of a cold or flu. Do not treat yourself. This drug may decrease your body's ability to fight infection. Try to avoid being around people who are sick. You should make sure you get enough calcium and vitamin D while you are taking this medicine, unless your doctor tells you not to. Discuss the foods you eat and the vitamins you take with your health care professional. See your dentist regularly. Brush and floss your teeth as directed. Before you have any dental work done, tell your dentist you are receiving this medicine. Do not become pregnant while taking this medicine or for 5 months after stopping  it. Talk with your doctor or health care professional about your birth control options while taking this medicine. Women should inform their doctor if they wish to become pregnant or think they might be pregnant. There is a potential for serious side effects to an unborn child. Talk  to your health care professional or pharmacist for more information. What side effects may I notice from receiving this medicine? Side effects that you should report to your doctor or health care professional as soon as possible: -allergic reactions like skin rash, itching or hives, swelling of the face, lips, or tongue -bone pain -breathing problems -dizziness -jaw pain, especially after dental work -redness, blistering, peeling of the skin -signs and symptoms of infection like fever or chills; cough; sore throat; pain or trouble passing urine -signs of low calcium like fast heartbeat, muscle cramps or muscle pain; pain, tingling, numbness in the hands or feet; seizures -unusual bleeding or bruising -unusually weak or tired Side effects that usually do not require medical attention (report to your doctor or health care professional if they continue or are bothersome): -constipation -diarrhea -headache -joint pain -loss of appetite -muscle pain -runny nose -tiredness -upset stomach This list may not describe all possible side effects. Call your doctor for medical advice about side effects. You may report side effects to FDA at 1-800-FDA-1088. Where should I keep my medicine? This medicine is only given in a clinic, doctor's office, or other health care setting and will not be stored at home. NOTE: This sheet is a summary. It may not cover all possible information. If you have questions about this medicine, talk to your doctor, pharmacist, or health care provider.  2018 Elsevier/Gold Standard (2016-05-15 19:17:21)   Fulvestrant injection What is this medicine? FULVESTRANT (ful VES trant) blocks the effects of estrogen. It is used to treat breast cancer. This medicine may be used for other purposes; ask your health care provider or pharmacist if you have questions. COMMON BRAND NAME(S): FASLODEX What should I tell my health care provider before I take this medicine? They need to  know if you have any of these conditions: -bleeding problems -liver disease -low levels of platelets in the blood -an unusual or allergic reaction to fulvestrant, other medicines, foods, dyes, or preservatives -pregnant or trying to get pregnant -breast-feeding How should I use this medicine? This medicine is for injection into a muscle. It is usually given by a health care professional in a hospital or clinic setting. Talk to your pediatrician regarding the use of this medicine in children. Special care may be needed. Overdosage: If you think you have taken too much of this medicine contact a poison control center or emergency room at once. NOTE: This medicine is only for you. Do not share this medicine with others. What if I miss a dose? It is important not to miss your dose. Call your doctor or health care professional if you are unable to keep an appointment. What may interact with this medicine? -medicines that treat or prevent blood clots like warfarin, enoxaparin, and dalteparin This list may not describe all possible interactions. Give your health care provider a list of all the medicines, herbs, non-prescription drugs, or dietary supplements you use. Also tell them if you smoke, drink alcohol, or use illegal drugs. Some items may interact with your medicine. What should I watch for while using this medicine? Your condition will be monitored carefully while you are receiving this medicine. You will need important blood work done while you   are taking this medicine. Do not become pregnant while taking this medicine or for at least 1 year after stopping it. Women of child-bearing potential will need to have a negative pregnancy test before starting this medicine. Women should inform their doctor if they wish to become pregnant or think they might be pregnant. There is a potential for serious side effects to an unborn child. Men should inform their doctors if they wish to father a child. This  medicine may lower sperm counts. Talk to your health care professional or pharmacist for more information. Do not breast-feed an infant while taking this medicine or for 1 year after the last dose. What side effects may I notice from receiving this medicine? Side effects that you should report to your doctor or health care professional as soon as possible: -allergic reactions like skin rash, itching or hives, swelling of the face, lips, or tongue -feeling faint or lightheaded, falls -pain, tingling, numbness, or weakness in the legs -signs and symptoms of infection like fever or chills; cough; flu-like symptoms; sore throat -vaginal bleeding Side effects that usually do not require medical attention (report to your doctor or health care professional if they continue or are bothersome): -aches, pains -constipation -diarrhea -headache -hot flashes -nausea, vomiting -pain at site where injected -stomach pain This list may not describe all possible side effects. Call your doctor for medical advice about side effects. You may report side effects to FDA at 1-800-FDA-1088. Where should I keep my medicine? This drug is given in a hospital or clinic and will not be stored at home. NOTE: This sheet is a summary. It may not cover all possible information. If you have questions about this medicine, talk to your doctor, pharmacist, or health care provider.  2018 Elsevier/Gold Standard (2014-11-19 11:03:55)  

## 2016-11-02 ENCOUNTER — Encounter (HOSPITAL_COMMUNITY): Payer: Self-pay

## 2016-11-12 ENCOUNTER — Telehealth: Payer: Self-pay | Admitting: Hematology and Oncology

## 2016-11-12 NOTE — Telephone Encounter (Signed)
Results of Foundation 1 testing became available. I called the patient and left a message. Patient had PTEN mutation for which Everolimus appears to be a treatment that would be effective. Based on this finding I recommended treatment with exemestane and Everolimus. I instructed the patient to call us back so that we can get started with the treatment.

## 2016-11-14 ENCOUNTER — Other Ambulatory Visit: Payer: Self-pay | Admitting: Hematology and Oncology

## 2016-11-14 ENCOUNTER — Telehealth: Payer: Self-pay | Admitting: Pharmacy Technician

## 2016-11-14 MED ORDER — EXEMESTANE 25 MG PO TABS: 25.0000 mg | ORAL_TABLET | Freq: Every day | ORAL | 3 refills | Status: DC

## 2016-11-14 MED ORDER — EVEROLIMUS 7.5 MG PO TABS: 7.5000 mg | ORAL_TABLET | Freq: Every day | ORAL | 6 refills | Status: DC

## 2016-11-14 NOTE — Telephone Encounter (Signed)
Oral Oncology Patient Advocate Encounter  Received notification from [employer] that prior authorization for Afinitor was required.  PA submitted and approved  PA# [id number] Approval dates: 11/14/2016 through 05/06/2017  Oral Oncology Clinic will continue to follow.  Fabio Asa. Melynda Keller, [facility] Patient Advocate [phone] 11/14/2016 2:21 PM

## 2016-11-15 ENCOUNTER — Encounter: Payer: Self-pay | Admitting: Genetics

## 2016-11-15 ENCOUNTER — Telehealth: Payer: Self-pay | Admitting: Pharmacist

## 2016-11-15 DIAGNOSIS — Z1379 Encounter for other screening for genetic and chromosomal anomalies: Secondary | ICD-10-CM | POA: Insufficient documentation

## 2016-11-15 DIAGNOSIS — Z170 Estrogen receptor positive status [ER+]: Principal | ICD-10-CM

## 2016-11-15 DIAGNOSIS — C50411 Malignant neoplasm of upper-outer quadrant of right female breast: Secondary | ICD-10-CM

## 2016-11-15 MED ORDER — DEXAMETHASONE 0.5 MG/5ML PO SOLN: ORAL | 0 refills | Status: DC

## 2016-11-15 NOTE — Telephone Encounter (Signed)
Oral Chemotherapy Pharmacist Encounter  Received notification about new Afinitor and exemestane prescriptions for the treatment of hormone-receptor positive metastatic breast cancer that has progressed on Ibrance/aromatase inhibitor therapy. BRCA testing performed, patient with no mutations found. 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  10/30/16 labs reviewed, OK for treatment  Current medication list in Epic reviewed, no significant DDIs with Afinitor or exemestane identified.  Prior authorization has been approved, copayment for Afinitor $1300, this is prohibitively expensive for the patient.  I spoke with patient for overview of new oral chemotherapy medications: Afinitor and exemestane. Patient has picked up a 27-monthsupply of the exemestane and will start it tomorrow (11/16/16). We will work with patient for Afinitor copayment issues  Counseled patient on administration, dosing, side effects, safe handling, and monitoring. Patient will take exemestane '25mg'$  tablet by mouth once daily. Patient will take Afinitor 7.'5mg'$  tablet by mouth once daily with a glass of water. Patient will continue to avoid grapefruit and grapefruit juice as she did with Ibrance therapy.  Side effects include but not limited to: hot flashes, fatigue, GI upset, mouth sores, decreased blood counts, and lipid or blood sugar abnormalities. Dexamethasone mouthwash for the prevention of stomatitis has been e-scribed. Mrs. CRennevoiced understanding and appreciation.   All questions answered. [facility] manufacturer assistance application for Afinitor as there are no copayment foundations open for patient's diagnosis. Manufacturer application will be updated in a separate encounter.  Oral Oncology Clinic will continue to follow.  JJohny Drilling PharmD, BCPS, BCOP 11/15/2016  1:07 PM Oral Oncology Clinic [phone]

## 2016-11-15 NOTE — Telephone Encounter (Signed)
Oral Chemotherapy Pharmacist Encounter  Met patient in [facility] lobby to complete application for [employer] [place] ([employer]) to try to obtain Afinitor at 40 out of pocket cost to the patient from the manufacturer.  Completed application faxed to [employer] at [phone].  This encounter will continue to be updated until final determination.  Oral Oncology Clinic will continue to follow.  Johny Drilling, PharmD, BCPS, BCOP 11/15/2016  4:53 PM Oral Oncology Clinic [phone]

## 2016-11-16 ENCOUNTER — Encounter: Payer: Self-pay | Admitting: Pharmacist

## 2016-11-16 MED FILL — DEXAMETHASONE 0.5 MG/5 ML L: 0.5 | 12 days supply | Qty: 500 | Fill #0

## 2016-11-16 NOTE — Progress Notes (Signed)
Oral Chemotherapy Pharmacist Encounter  Dispensed samples to patient:  Medication: Afinitor 5mg  tablets Instructions: Take 1 tablet by mouth once daily Quantity dispensed: 14 Days supply: 14 Manufacturer: [employer] Lot: [id number] Exp: Feb 2019  Johny Drilling, PharmD, BCPS, BCOP 11/16/2016 1:06 PM Oral Oncology Clinic [phone]

## 2016-11-19 ENCOUNTER — Telehealth: Payer: Self-pay | Admitting: Pharmacy Technician

## 2016-11-19 MED FILL — AFINITOR 7.5 MG TAB: 7.5 | 28 days supply | Qty: 28 | Fill #0

## 2016-11-19 NOTE — Telephone Encounter (Signed)
Oral Oncology Patient Advocate Encounter  Was successful in securing patient an $5,000.00 grant from [employer] [place] (PAF) to provide copayment coverage for her Afinitor.  This will keep the out of pocket expense at $0.    I have left a message for the patient with the good news.   The billing information is as follows and has been shared with [facility].   Member ID: [id number]  Group ID: [id number] RxBin: [id number] Dates of Eligibility: 08/18/2016 through 11/19/2017  Fabio Asa. Melynda Keller, [place] [facility] [place] [phone] 11/19/2016 10:19 AM

## 2016-11-21 NOTE — Telephone Encounter (Signed)
Oral Oncology Patient Advocate Encounter  Received notification from [employer] Patient Assistance program that patient has been successfully enrolled into their program to receive Afinitor from the manufacturer at $0 out of pocket until 05/06/2017.   I called and left a message for the patient with the good news.   I let her know that we will have to re-apply at the beginning of 2019.  I asked the patient to call the office with questions or concerns.  Oral Oncology Clinic will continue to follow.  Gilmore Laroche, CPhT, [facility] Oral Oncology Patient Advocate [phone] 11/21/2016 2:39 PM

## 2016-11-26 ENCOUNTER — Ambulatory Visit: Payer: Medicare Other

## 2016-11-26 ENCOUNTER — Encounter: Payer: Self-pay | Admitting: Hematology and Oncology

## 2016-11-26 ENCOUNTER — Ambulatory Visit (HOSPITAL_BASED_OUTPATIENT_CLINIC_OR_DEPARTMENT_OTHER): Payer: Medicare Other | Admitting: Hematology and Oncology

## 2016-11-26 ENCOUNTER — Other Ambulatory Visit (HOSPITAL_BASED_OUTPATIENT_CLINIC_OR_DEPARTMENT_OTHER): Payer: Medicare Other

## 2016-11-26 ENCOUNTER — Ambulatory Visit (HOSPITAL_BASED_OUTPATIENT_CLINIC_OR_DEPARTMENT_OTHER): Payer: Medicare Other

## 2016-11-26 VITALS — BP 155/74 | HR 80 | Temp 97.8°F | Resp 18 | Ht 69.0 in | Wt 288.9 lb

## 2016-11-26 DIAGNOSIS — C50411 Malignant neoplasm of upper-outer quadrant of right female breast: Secondary | ICD-10-CM

## 2016-11-26 DIAGNOSIS — Z95828 Presence of other vascular implants and grafts: Secondary | ICD-10-CM

## 2016-11-26 DIAGNOSIS — I2699 Other pulmonary embolism without acute cor pulmonale: Secondary | ICD-10-CM

## 2016-11-26 DIAGNOSIS — Z452 Encounter for adjustment and management of vascular access device: Secondary | ICD-10-CM

## 2016-11-26 DIAGNOSIS — C787 Secondary malignant neoplasm of liver and intrahepatic bile duct: Secondary | ICD-10-CM | POA: Diagnosis not present

## 2016-11-26 DIAGNOSIS — Z170 Estrogen receptor positive status [ER+]: Secondary | ICD-10-CM

## 2016-11-26 DIAGNOSIS — C7951 Secondary malignant neoplasm of bone: Secondary | ICD-10-CM | POA: Diagnosis not present

## 2016-11-26 LAB — COMPREHENSIVE METABOLIC PANEL
ALK PHOS: 476 U/L — AB (ref 40–150)
ALT: 59 U/L — ABNORMAL HIGH (ref 0–55)
ANION GAP: 9 meq/L (ref 3–11)
AST: 86 U/L — ABNORMAL HIGH (ref 5–34)
Albumin: 2.8 g/dL — ABNORMAL LOW (ref 3.5–5.0)
BILIRUBIN TOTAL: 0.92 mg/dL (ref 0.20–1.20)
BUN: 18.3 mg/dL (ref 7.0–26.0)
CO2: 23 meq/L (ref 22–29)
Calcium: 9.1 mg/dL (ref 8.4–10.4)
Chloride: 106 mEq/L (ref 98–109)
Creatinine: 0.9 mg/dL (ref 0.6–1.1)
EGFR: 70 mL/min/{1.73_m2} — AB (ref >90)
Glucose: 301 mg/dl — ABNORMAL HIGH (ref 70–140)
POTASSIUM: 4.7 meq/L (ref 3.5–5.1)
SODIUM: 138 meq/L (ref 136–145)
TOTAL PROTEIN: 6.5 g/dL (ref 6.4–8.3)

## 2016-11-26 LAB — CBC WITH DIFFERENTIAL/PLATELET
BASO%: 1.1 % (ref 0.0–2.0)
BASOS ABS: 0.1 10*3/uL (ref 0.0–0.1)
EOS ABS: 0.1 10*3/uL (ref 0.0–0.5)
EOS%: 1.2 % (ref 0.0–7.0)
HCT: 37.3 % (ref 34.8–46.6)
HGB: 12.3 g/dL (ref 11.6–15.9)
LYMPH%: 16 % (ref 14.0–49.7)
MCH: 30.8 pg (ref 25.1–34.0)
MCHC: 32.9 g/dL (ref 31.5–36.0)
MCV: 93.6 fL (ref 79.5–101.0)
MONO#: 0.7 10*3/uL (ref 0.1–0.9)
MONO%: 12 % (ref 0.0–14.0)
NEUT%: 69.7 % (ref 38.4–76.8)
NEUTROS ABS: 4.2 10*3/uL (ref 1.5–6.5)
PLATELETS: 178 10*3/uL (ref 145–400)
RBC: 3.99 10*6/uL (ref 3.70–5.45)
RDW: 17.2 % — ABNORMAL HIGH (ref 11.2–14.5)
WBC: 5.9 10*3/uL (ref 3.9–10.3)
lymph#: 0.9 10*3/uL (ref 0.9–3.3)

## 2016-11-26 MED ORDER — HEPARIN SOD (PORK) LOCK FLUSH 100 UNIT/ML IV SOLN
500.0000 [IU] | Freq: Once | INTRAVENOUS | Status: AC | PRN
  Administered 2016-11-26: 500 [IU] via INTRAVENOUS
  Filled 2016-11-26: qty 5

## 2016-11-26 MED ORDER — SODIUM CHLORIDE 0.9 % IJ SOLN
10.0000 mL | INTRAMUSCULAR | Status: DC | PRN
  Administered 2016-11-26: 10 mL via INTRAVENOUS
  Filled 2016-11-26: qty 10

## 2016-11-26 NOTE — Progress Notes (Signed)
Patient Care Team: Jacelyn Pi, MD as PCP - General (Endocrinology)  DIAGNOSIS:  Encounter Diagnosis  Name Primary?  . Malignant neoplasm of upper-outer quadrant of right breast in female, estrogen receptor positive ([place]) Yes    SUMMARY OF ONCOLOGIC HISTORY:   Breast cancer of upper-outer quadrant of right female breast ([place])   02/02/2014 Mammogram    Right breast: 5.8 cm irregular high-density solid mass suggestive of malignancy; left breast next millimeter internal mammary lymph node      02/04/2014 Initial Biopsy     2 biopsies were performed the right breast and axilla: Grade 2 invasive ductal carcinoma ER/PR positive HER-2 negative Ki-67 20% to 47%: Biopsy of right humerus metastatic breast cancer estrogen positive      02/17/2014 PET scan    Hypermetabolic right breast cancer and right axillary lymph nodes subpectoral lymph nodes indeterminate right middle lobe pulmonary nodule and hypermetabolic proximal right humerus lesion      03/05/2014 - 07/19/2014 Neo-Adjuvant Chemotherapy    Dose dense Adriamycin and Cytoxan x4 cycles followed by Abraxane weekly x12      07/26/2014 Breast MRI    Multifocal breast cancer decreased in size, retroareolar 13 mm now 11 mm, middle third 22 mm now 18 mm, 3.9 x 3.9 cm now 3.9 x 2.6 cm, multiple smaller nodules decreased in size, right x-ray lymph node 3.6 cm now 2 cm other lymph nodes are smaller      07/28/2014 - 08/12/2015 Anti-estrogen oral therapy    Anastrozole 1 mg daily      08/02/2014 PET scan    Interval improvement in the right breast mass and axillary/subpectoral lymph nodes and right humerus head. Several subpleural nodules are not visible, 3.4. cm adrenal adenoma      09/22/2014 Surgery    Bilateral mastectomy: Right: IDC Grade 2, 2.7cm, LVI, PNI, 6/8 LN Positive, Er 90%, PR 5% T2N2      03/22/2015 -  Radiation Therapy    adjuvant radiation therapy with Dr. Isidore Moos      08/10/2015 PET scan    Progression of disease with  several new bone metastases, T5, T7, left sacrum, left iliac bone, gastrohepatic lymph node      08/12/2015 - 10/30/2016 Anti-estrogen oral therapy    Faslodex with Brock Bad every 3 months for bone metastases (stopped for progression of liver metastases)      11/12/2016 -  Anti-estrogen oral therapy    Exemestane Everolimus       CHIEF COMPLIANT: Exemestane Everolimus toxicity check  INTERVAL HISTORY: Tonya Alvarez is a 71 year old with above-mentioned history metastatic breast cancer who is currently on exemestane Everolimus. She is been on it for the past 2 weeks. It appears that she has had sore nose in the mouth in spite of using dexamethasone mouth rinses. It is burning especially if she eats acidic foods.  REVIEW OF SYSTEMS:   Constitutional: Denies fevers, chills or abnormal weight loss Eyes: Denies blurriness of vision Ears, nose, mouth, throat, and face: Mouth sores Respiratory: Denies cough, dyspnea or wheezes Cardiovascular: Denies palpitation, chest discomfort Gastrointestinal:  Denies nausea, heartburn or change in bowel habits Skin: Denies abnormal skin rashes Lymphatics: Denies new lymphadenopathy or easy bruising Neurological:Denies numbness, tingling or new weaknesses Behavioral/Psych: Mood is stable, no new changes  Extremities: No lower extremity edema Breast:  denies any pain or lumps or nodules in either breasts All other systems were reviewed with the patient and are negative.  I have reviewed the past medical history,  past surgical history, social history and family history with the patient and they are unchanged from previous note.  ALLERGIES:  is allergic to amoxicillin; hydrocodone; and lisinopril.  MEDICATIONS:  Current Outpatient Prescriptions  Medication Sig Dispense Refill  . calcium-vitamin D (OSCAL WITH D) 500-200 MG-UNIT per tablet Take 1 tablet by mouth daily.    . clindamycin (CLEOCIN) 300 MG capsule Take 900 mg by mouth See admin  instructions. Take 3 capsules (900 mg) by mouth one hour prior to dental appointment - last appointment October 11, 2015    . dexamethasone (DECADRON) 0.5 MG/5ML solution 4 times daily swish 77m in mouth for 252m and spit out. Aviod eating/drinking for 1 hour post-rinse. Start with Afinitor, use for 8 weeks. 500 mL 0  . everolimus (AFINITOR) 7.5 MG tablet Take 1 tablet (7.5 mg total) by mouth daily. 30 tablet 6  . exemestane (AROMASIN) 25 MG tablet Take 1 tablet (25 mg total) by mouth daily after breakfast. 90 tablet 3  . fluticasone (FLONASE) 50 MCG/ACT nasal spray Place 1 spray into both nostrils daily as needed for allergies or rhinitis. (Patient taking differently: Place 1 spray into both nostrils at bedtime as needed for allergies or rhinitis. ) 16 g 0  . furosemide (LASIX) 40 MG tablet TAKE 1 TABLET BY MOUTH  DAILY 90 tablet 0  . gabapentin (NEURONTIN) 300 MG capsule Take 300 mg by mouth 3 (three) times daily.     . Marland Kitchenbuprofen (ADVIL,MOTRIN) 600 MG tablet Take 600 mg by mouth every 12 (twelve) hours.    . insulin lispro protamine-lispro (HUMALOG 75/25 MIX) (75-25) 100 UNIT/ML SUSP injection Inject 10-50 Units into the skin See admin instructions. Inject 50 units subcutaneously with breakfast and supper, inject 10 mls after lunch if CBG >150    . lidocaine-prilocaine (EMLA) cream Apply 1 application topically as needed. (Patient taking differently: Apply 1 application topically as needed (prior to flushing of port (every 4-6 weeks)). ) 30 g 6  . Liniments (SALONPAS PAIN RELIEF PATCH EX) Place 1 patch onto the skin daily as needed (PAIN).    . Marland Kitchenosartan (COZAAR) 50 MG tablet Take 50 mg by mouth daily.     . metFORMIN (GLUCOPHAGE-XR) 500 MG 24 hr tablet Take 2 tablets (1,000 mg total) by mouth 2 (two) times daily.    . Multiple Vitamin (MULTIVITAMIN WITH MINERALS) TABS tablet Take 1 tablet by mouth at bedtime.    . rivaroxaban (XARELTO) 20 MG TABS tablet TAKE 1 TABLET BY MOUTH  DAILY WITH SUPPER 30  tablet 11  . simvastatin (ZOCOR) 20 MG tablet Take 20 mg by mouth at bedtime.     No current facility-administered medications for this visit.     PHYSICAL EXAMINATION: ECOG PERFORMANCE STATUS: 1 - Symptomatic but completely ambulatory  Vitals:   11/26/16 1524  BP: (!) 155/74  Pulse: 80  Resp: 18  Temp: 97.8 F (36.6 C)   Filed Weights   11/26/16 1524  Weight: 288 lb 14.4 oz (131 kg)    GENERAL:alert, no distress and comfortable SKIN: skin color, texture, turgor are normal, no rashes or significant lesions EYES: normal, Conjunctiva are pink and non-injected, sclera clear OROPHARYNX:no exudate, no erythema and lips, buccal mucosa, and tongue normal  NECK: supple, thyroid normal size, non-tender, without nodularity LYMPH:  no palpable lymphadenopathy in the cervical, axillary or inguinal LUNGS: clear to auscultation and percussion with normal breathing effort HEART: regular rate & rhythm and no murmurs and no lower extremity edema ABDOMEN:abdomen soft, non-tender  and normal bowel sounds MUSCULOSKELETAL:no cyanosis of digits and no clubbing  NEURO: alert & oriented x 3 with fluent speech, no focal motor/sensory deficits EXTREMITIES: No lower extremity edema   LABORATORY DATA:  I have reviewed the data as listed   Chemistry      Component Value Date/Time   NA 138 11/26/2016 1452   K 4.7 11/26/2016 1452   CL 107 10/24/2015 0431   CO2 23 11/26/2016 1452   BUN 18.3 11/26/2016 1452   CREATININE 0.9 11/26/2016 1452      Component Value Date/Time   CALCIUM 9.1 11/26/2016 1452   ALKPHOS 476 (H) 11/26/2016 1452   AST 86 (H) 11/26/2016 1452   ALT 59 (H) 11/26/2016 1452   BILITOT 0.92 11/26/2016 1452       Lab Results  Component Value Date   WBC 5.9 11/26/2016   HGB 12.3 11/26/2016   HCT 37.3 11/26/2016   MCV 93.6 11/26/2016   PLT 178 11/26/2016   NEUTROABS 4.2 11/26/2016    ASSESSMENT & PLAN:  Breast cancer of upper-outer quadrant of right female breast  ([place]) 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 Followed by 12 cycles of weekly Abraxane completed 07/19/2014 S/P Mastectomy 09/22/14: Bilateral mastectomies: Right: IDC Grade 2, 2.7cm, LVI, PNI, 6/8 LN Positive, Er 90%, PR 5% T2N2 (Stage IIIA) Nonhealing right breast wound: finally healed Adjuvant radiation therapy started 03/22/2015 to complete 05/11/2015  Treatment Summary: antiestrogen therapy with anastrozole 1 mg daily Jan 2017- April 2017 (stopped when she got diagnosed with metastatic disease) Anastrozole toxicities: Hot flashes for which she is prescribed gabapentin 300 twice a day  PET/CT scan 08/08/2015: Multifocal areas of increased hypermetabolic is in new/progressive but without definite underlying lesion on CT scan suspicious for metastases, T5, T7, left sacrum, left iliac bone; 13 mm gastrohepatic node SUV 5.2 Suspicious for nodal metastases. ----------------------------------------------------------------------------------------------------------------------------------------------------------------- Treatment plan:Ibrance with Faslodex. Started 08/12/2015 ; discontinued 10/09/2016 due to progression Bone metastases XGeva once every 3 months (started 01/12/2016)  Pulmonary emboli: Currently on xarelto  10/02/2016 CT CAP: New hepatic metastases with the right and left hepatic lobes. New lung metastases. Stable sclerotic metastases within the pelvis and spine   Liver biopsy 10/15/2016: Metastatic carcinoma consistent with breast primary ER 100% PR 0%, HER-2 negative ratio 1.33  Treatment plan: 1. BRCA mutation analysis: Neg 2. 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 Exemestane with Everolimus started 11/15/2016  Exemestane and Everolimus toxicities: 1. Mouth sore: Roof of the mouth feels sore  especially with acidic foods: Patient is using dexamethasone mouth rinses Otherwise patient is tolerating it well. Return to clinic in 4 weeks for toxicity check and follow-up   I spent 25 minutes talking to the patient of which more than half was spent in counseling and coordination of care.  Orders Placed This Encounter  Procedures  . CBC with Differential    Standing Status:   Future    Standing Expiration Date:   11/26/2017  . Comprehensive metabolic panel    Standing Status:   Future    Standing Expiration Date:   11/26/2017   The patient has a good understanding of the overall plan. she agrees with it. she will call with any problems that may develop before the next visit here.   Rulon Eisenmenger, MD 11/26/16

## 2016-11-26 NOTE — Patient Instructions (Signed)

## 2016-11-26 NOTE — Assessment & Plan Note (Signed)
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 Followed by 12 cycles of weekly Abraxane completed 07/19/2014 S/P Mastectomy 09/22/14: Bilateral mastectomies: Right: IDC Grade 2, 2.7cm, LVI, PNI, 6/8 LN Positive, Er 90%, PR 5% T2N2 (Stage IIIA) Nonhealing right breast wound: finally healed Adjuvant radiation therapy started 03/22/2015 to complete 05/11/2015  Treatment Summary: antiestrogen therapy with anastrozole 1 mg daily Jan 2017- April 2017 (stopped when she got diagnosed with metastatic disease) Anastrozole toxicities: Hot flashes for which she is prescribed gabapentin 300 twice a day  PET/CT scan 08/08/2015: Multifocal areas of increased hypermetabolic is in new/progressive but without definite underlying lesion on CT scan suspicious for metastases, T5, T7, left sacrum, left iliac bone; 13 mm gastrohepatic node SUV 5.2 Suspicious for nodal metastases. ----------------------------------------------------------------------------------------------------------------------------------------------------------------- Treatment plan:Ibrance with Faslodex. Started 08/12/2015 ; discontinued 10/09/2016 due to progression Bone metastases XGeva once every 3 months (started 01/12/2016)  Pulmonary emboli: Currently on xarelto  10/02/2016 CT CAP: New hepatic metastases with the right and left hepatic lobes. New lung metastases. Stable sclerotic metastases within the pelvis and spine   Liver biopsy 10/15/2016: Metastatic carcinoma consistent with breast primary ER 100% PR 0%, HER-2 negative ratio 1.33  Treatment plan: 1. BRCA mutation analysis: Neg 2. 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 Exemestane with Everolimus started 11/15/2016  Exemestane and Everolimus toxicities: 1. Mouth sore: Roof of the mouth feels sore especially  with acidic foods: Patient is using dexamethasone mouth rinses Otherwise patient is tolerating it well. Return to clinic in 4 weeks for toxicity check and follow-up

## 2016-12-03 ENCOUNTER — Other Ambulatory Visit: Payer: Self-pay | Admitting: Pharmacist

## 2016-12-03 DIAGNOSIS — Z170 Estrogen receptor positive status [ER+]: Principal | ICD-10-CM

## 2016-12-03 DIAGNOSIS — C50411 Malignant neoplasm of upper-outer quadrant of right female breast: Secondary | ICD-10-CM

## 2016-12-03 MED ORDER — DEXAMETHASONE 0.5 MG/5ML PO SOLN: ORAL | 1 refills | Status: DC

## 2016-12-03 MED FILL — DEXAMETHASONE 0.5 MG/5 ML L: 0.5 | 12 days supply | Qty: 500 | Fill #0

## 2016-12-11 MED FILL — AFINITOR 7.5 MG TAB: 7.5 | 28 days supply | Qty: 28 | Fill #1

## 2016-12-12 ENCOUNTER — Ambulatory Visit: Payer: Medicare Other | Admitting: Hematology and Oncology

## 2016-12-12 ENCOUNTER — Other Ambulatory Visit: Payer: Medicare Other

## 2016-12-12 MED FILL — DEXAMETHASONE 0.5 MG/5 ML L: 0.5 | 12 days supply | Qty: 500 | Fill #1

## 2016-12-24 ENCOUNTER — Other Ambulatory Visit (HOSPITAL_BASED_OUTPATIENT_CLINIC_OR_DEPARTMENT_OTHER): Payer: Medicare Other

## 2016-12-24 ENCOUNTER — Ambulatory Visit (HOSPITAL_BASED_OUTPATIENT_CLINIC_OR_DEPARTMENT_OTHER): Payer: Medicare Other

## 2016-12-24 ENCOUNTER — Ambulatory Visit (HOSPITAL_BASED_OUTPATIENT_CLINIC_OR_DEPARTMENT_OTHER): Payer: Medicare Other | Admitting: Hematology and Oncology

## 2016-12-24 DIAGNOSIS — C773 Secondary and unspecified malignant neoplasm of axilla and upper limb lymph nodes: Secondary | ICD-10-CM | POA: Diagnosis not present

## 2016-12-24 DIAGNOSIS — I2699 Other pulmonary embolism without acute cor pulmonale: Secondary | ICD-10-CM | POA: Diagnosis not present

## 2016-12-24 DIAGNOSIS — C7951 Secondary malignant neoplasm of bone: Secondary | ICD-10-CM | POA: Diagnosis not present

## 2016-12-24 DIAGNOSIS — Z95828 Presence of other vascular implants and grafts: Secondary | ICD-10-CM

## 2016-12-24 DIAGNOSIS — C50411 Malignant neoplasm of upper-outer quadrant of right female breast: Secondary | ICD-10-CM | POA: Diagnosis not present

## 2016-12-24 DIAGNOSIS — Z7901 Long term (current) use of anticoagulants: Secondary | ICD-10-CM

## 2016-12-24 DIAGNOSIS — Z170 Estrogen receptor positive status [ER+]: Secondary | ICD-10-CM | POA: Diagnosis not present

## 2016-12-24 DIAGNOSIS — Z452 Encounter for adjustment and management of vascular access device: Secondary | ICD-10-CM | POA: Diagnosis not present

## 2016-12-24 LAB — CBC WITH DIFFERENTIAL/PLATELET
BASO%: 0.7 % (ref 0.0–2.0)
Basophils Absolute: 0 10*3/uL (ref 0.0–0.1)
EOS ABS: 0.1 10*3/uL (ref 0.0–0.5)
EOS%: 1.5 % (ref 0.0–7.0)
HCT: 33.8 % — ABNORMAL LOW (ref 34.8–46.6)
HGB: 10.9 g/dL — ABNORMAL LOW (ref 11.6–15.9)
LYMPH%: 17.6 % (ref 14.0–49.7)
MCH: 29.2 pg (ref 25.1–34.0)
MCHC: 32.2 g/dL (ref 31.5–36.0)
MCV: 90.6 fL (ref 79.5–101.0)
MONO#: 1.1 10*3/uL — AB (ref 0.1–0.9)
MONO%: 19.1 % — AB (ref 0.0–14.0)
NEUT%: 61.1 % (ref 38.4–76.8)
NEUTROS ABS: 3.6 10*3/uL (ref 1.5–6.5)
Platelets: 158 10*3/uL (ref 145–400)
RBC: 3.73 10*6/uL (ref 3.70–5.45)
RDW: 16.6 % — AB (ref 11.2–14.5)
WBC: 5.9 10*3/uL (ref 3.9–10.3)
lymph#: 1 10*3/uL (ref 0.9–3.3)

## 2016-12-24 LAB — COMPREHENSIVE METABOLIC PANEL
ALT: 89 U/L — ABNORMAL HIGH (ref 0–55)
AST: 99 U/L — ABNORMAL HIGH (ref 5–34)
Albumin: 2.6 g/dL — ABNORMAL LOW (ref 3.5–5.0)
Alkaline Phosphatase: 502 U/L — ABNORMAL HIGH (ref 40–150)
Anion Gap: 7 mEq/L (ref 3–11)
BUN: 20 mg/dL (ref 7.0–26.0)
CO2: 22 meq/L (ref 22–29)
Calcium: 8.8 mg/dL (ref 8.4–10.4)
Chloride: 111 mEq/L — ABNORMAL HIGH (ref 98–109)
Creatinine: 0.9 mg/dL (ref 0.6–1.1)
EGFR: 62 mL/min/{1.73_m2} — AB (ref >90)
GLUCOSE: 90 mg/dL (ref 70–140)
POTASSIUM: 4.6 meq/L (ref 3.5–5.1)
SODIUM: 141 meq/L (ref 136–145)
Total Bilirubin: 1.35 mg/dL — ABNORMAL HIGH (ref 0.20–1.20)
Total Protein: 6.2 g/dL — ABNORMAL LOW (ref 6.4–8.3)

## 2016-12-24 MED ORDER — SODIUM CHLORIDE 0.9 % IJ SOLN
10.0000 mL | INTRAMUSCULAR | Status: DC | PRN
  Administered 2016-12-24: 10 mL via INTRAVENOUS
  Filled 2016-12-24: qty 10

## 2016-12-24 MED ORDER — HEPARIN SOD (PORK) LOCK FLUSH 100 UNIT/ML IV SOLN
500.0000 [IU] | Freq: Once | INTRAVENOUS | Status: AC | PRN
  Administered 2016-12-24: 500 [IU] via INTRAVENOUS
  Filled 2016-12-24: qty 5

## 2016-12-24 NOTE — Assessment & Plan Note (Signed)
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 Followed by 12 cycles of weekly Abraxane completed 07/19/2014 S/P Mastectomy 09/22/14: Bilateral mastectomies: Right: IDC Grade 2, 2.7cm, LVI, PNI, 6/8 LN Positive, Er 90%, PR 5% T2N2 (Stage IIIA) Nonhealing right breast wound: finally healed Adjuvant radiation therapy started 03/22/2015 to complete 05/11/2015  Treatment Summary: antiestrogen therapy with anastrozole 1 mg daily Jan 2017- April 2017 (stopped when she got diagnosed with metastatic disease) Anastrozole toxicities: Hot flashes for which she is prescribed gabapentin 300 twice a day  PET/CT scan 08/08/2015: Multifocal areas of increased hypermetabolic is in new/progressive but without definite underlying lesion on CT scan suspicious for metastases, T5, T7, left sacrum, left iliac bone; 13 mm gastrohepatic node SUV 5.2 Suspicious for nodal metastases. ----------------------------------------------------------------------------------------------------------------------------------------------------------------- Treatment plan:Ibrance with Faslodex. Started 08/12/2015 ; discontinued 10/09/2016 due to progression Bone metastases XGeva once every 3 months (started 01/12/2016)  Pulmonary emboli: Currently on xarelto  10/02/2016 CT CAP: New hepatic metastases with the right and left hepatic lobes. New lung metastases. Stable sclerotic metastases within the pelvis and spine   Liver biopsy 10/15/2016: Metastatic carcinoma consistent with breast primary ER 100% PR 0%, HER-2 negative ratio 1.33  Treatment plan: 1. BRCA mutation analysis: Neg 2. 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 Exemestane with Everolimus started 11/15/2016  Exemestane and Everolimus toxicities: 1. Mouth sore: Roof of the mouth feels sore especially with  acidic foods: Patient is using dexamethasone mouth rinses Otherwise patient is tolerating it well. Return to clinic in 4 weeks for toxicity check and follow-up

## 2016-12-24 NOTE — Progress Notes (Signed)
Patient Care Team: Jacelyn Pi, MD as PCP - General (Endocrinology)  DIAGNOSIS:  Encounter Diagnosis  Name Primary?  . Malignant neoplasm of upper-outer quadrant of right breast in female, estrogen receptor positive ([place])     SUMMARY OF ONCOLOGIC HISTORY:   Breast cancer of upper-outer quadrant of right female breast ([place])   02/02/2014 Mammogram    Right breast: 5.8 cm irregular high-density solid mass suggestive of malignancy; left breast next millimeter internal mammary lymph node      02/04/2014 Initial Biopsy     2 biopsies were performed the right breast and axilla: Grade 2 invasive ductal carcinoma ER/PR positive HER-2 negative Ki-67 20% to 47%: Biopsy of right humerus metastatic breast cancer estrogen positive      02/17/2014 PET scan    Hypermetabolic right breast cancer and right axillary lymph nodes subpectoral lymph nodes indeterminate right middle lobe pulmonary nodule and hypermetabolic proximal right humerus lesion      03/05/2014 - 07/19/2014 Neo-Adjuvant Chemotherapy    Dose dense Adriamycin and Cytoxan x4 cycles followed by Abraxane weekly x12      07/26/2014 Breast MRI    Multifocal breast cancer decreased in size, retroareolar 13 mm now 11 mm, middle third 22 mm now 18 mm, 3.9 x 3.9 cm now 3.9 x 2.6 cm, multiple smaller nodules decreased in size, right x-ray lymph node 3.6 cm now 2 cm other lymph nodes are smaller      07/28/2014 - 08/12/2015 Anti-estrogen oral therapy    Anastrozole 1 mg daily      08/02/2014 PET scan    Interval improvement in the right breast mass and axillary/subpectoral lymph nodes and right humerus head. Several subpleural nodules are not visible, 3.4. cm adrenal adenoma      09/22/2014 Surgery    Bilateral mastectomy: Right: IDC Grade 2, 2.7cm, LVI, PNI, 6/8 LN Positive, Er 90%, PR 5% T2N2      03/22/2015 -  Radiation Therapy    adjuvant radiation therapy with Dr. Isidore Moos      08/10/2015 PET scan    Progression of disease with  several new bone metastases, T5, T7, left sacrum, left iliac bone, gastrohepatic lymph node      08/12/2015 - 10/30/2016 Anti-estrogen oral therapy    Faslodex with Brock Bad every 3 months for bone metastases (stopped for progression of liver metastases)      11/12/2016 -  Anti-estrogen oral therapy    Exemestane Everolimus       CHIEF COMPLIANT: Follow-up on Everolimus and exemestane  INTERVAL HISTORY: Tonya Alvarez is a 71 year old with metastatic breast cancer who is currently on palliative therapy with exemestane on Everolimus. She reports continuing mild problems with mouth sores. However with the dexamethasone mouth rinses her symptoms are quite manageable. She denies any nausea or vomiting. She has poor taste and appetite and because of that she is not able to eat food. Her abdomen has remained fairly low.  REVIEW OF SYSTEMS:   Constitutional: Denies fevers, chills or abnormal weight loss Eyes: Denies blurriness of vision Ears, nose, mouth, throat, and face: Intermittent mouth sores Respiratory: Denies cough, dyspnea or wheezes Cardiovascular: Denies palpitation, chest discomfort Gastrointestinal:  Denies nausea, heartburn or change in bowel habits Skin: Denies abnormal skin rashes Lymphatics: Denies new lymphadenopathy or easy bruising Neurological:Denies numbness, tingling or new weaknesses Behavioral/Psych: Mood is stable, no new changes  Extremities: No lower extremity edema  All other systems were reviewed with the patient and are negative.  I have reviewed  the past medical history, past surgical history, social history and family history with the patient and they are unchanged from previous note.  ALLERGIES:  is allergic to amoxicillin; hydrocodone; and lisinopril.  MEDICATIONS:  Current Outpatient Prescriptions  Medication Sig Dispense Refill  . calcium-vitamin D (OSCAL WITH D) 500-200 MG-UNIT per tablet Take 1 tablet by mouth daily.    . clindamycin (CLEOCIN)  300 MG capsule Take 900 mg by mouth See admin instructions. Take 3 capsules (900 mg) by mouth one hour prior to dental appointment - last appointment October 11, 2015    . dexamethasone (DECADRON) 0.5 MG/5ML solution 4 times daily swish 66m in mouth for 271m and spit out. Aviod eating/drinking for 1 hour post-rinse. Start with Afinitor, use for 8 weeks. 500 mL 1  . everolimus (AFINITOR) 7.5 MG tablet Take 1 tablet (7.5 mg total) by mouth daily. 30 tablet 6  . exemestane (AROMASIN) 25 MG tablet Take 1 tablet (25 mg total) by mouth daily after breakfast. 90 tablet 3  . fluticasone (FLONASE) 50 MCG/ACT nasal spray Place 1 spray into both nostrils daily as needed for allergies or rhinitis. (Patient taking differently: Place 1 spray into both nostrils at bedtime as needed for allergies or rhinitis. ) 16 g 0  . furosemide (LASIX) 40 MG tablet TAKE 1 TABLET BY MOUTH  DAILY 90 tablet 0  . gabapentin (NEURONTIN) 300 MG capsule Take 300 mg by mouth 3 (three) times daily.     . Marland Kitchenbuprofen (ADVIL,MOTRIN) 600 MG tablet Take 600 mg by mouth every 12 (twelve) hours.    . insulin lispro protamine-lispro (HUMALOG 75/25 MIX) (75-25) 100 UNIT/ML SUSP injection Inject 10-50 Units into the skin See admin instructions. Inject 50 units subcutaneously with breakfast and supper, inject 10 mls after lunch if CBG >150    . lidocaine-prilocaine (EMLA) cream Apply 1 application topically as needed. (Patient taking differently: Apply 1 application topically as needed (prior to flushing of port (every 4-6 weeks)). ) 30 g 6  . Liniments (SALONPAS PAIN RELIEF PATCH EX) Place 1 patch onto the skin daily as needed (PAIN).    . Marland Kitchenosartan (COZAAR) 50 MG tablet Take 50 mg by mouth daily.     . metFORMIN (GLUCOPHAGE-XR) 500 MG 24 hr tablet Take 2 tablets (1,000 mg total) by mouth 2 (two) times daily.    . Multiple Vitamin (MULTIVITAMIN WITH MINERALS) TABS tablet Take 1 tablet by mouth at bedtime.    . rivaroxaban (XARELTO) 20 MG TABS tablet TAKE  1 TABLET BY MOUTH  DAILY WITH SUPPER 30 tablet 11  . simvastatin (ZOCOR) 20 MG tablet Take 20 mg by mouth at bedtime.     No current facility-administered medications for this visit.     PHYSICAL EXAMINATION: ECOG PERFORMANCE STATUS: 1 - Symptomatic but completely ambulatory  Vitals:   12/24/16 1556  BP: 121/73  Pulse: 77  Resp: 18  Temp: 98.2 F (36.8 C)  SpO2: 99%   Filed Weights   12/24/16 1556  Weight: 291 lb 11.2 oz (132.3 kg)    GENERAL:alert, no distress and comfortable SKIN: skin color, texture, turgor are normal, no rashes or significant lesions EYES: normal, Conjunctiva are pink and non-injected, sclera clear OROPHARYNX:no exudate, no erythema and lips, buccal mucosa, and tongue normal  NECK: supple, thyroid normal size, non-tender, without nodularity LYMPH:  no palpable lymphadenopathy in the cervical, axillary or inguinal LUNGS: clear to auscultation and percussion with normal breathing effort HEART: regular rate & rhythm and no murmurs and no  lower extremity edema ABDOMEN:abdomen soft, non-tender and normal bowel sounds MUSCULOSKELETAL:no cyanosis of digits and no clubbing  NEURO: alert & oriented x 3 with fluent speech, no focal motor/sensory deficits EXTREMITIES: No lower extremity edema  LABORATORY DATA:  I have reviewed the data as listed   Chemistry      Component Value Date/Time   NA 141 12/24/2016 1435   K 4.6 12/24/2016 1435   CL 107 10/24/2015 0431   CO2 22 12/24/2016 1435   BUN 20.0 12/24/2016 1435   CREATININE 0.9 12/24/2016 1435      Component Value Date/Time   CALCIUM 8.8 12/24/2016 1435   ALKPHOS 502 (H) 12/24/2016 1435   AST 99 (H) 12/24/2016 1435   ALT 89 (H) 12/24/2016 1435   BILITOT 1.35 (H) 12/24/2016 1435       Lab Results  Component Value Date   WBC 5.9 12/24/2016   HGB 10.9 (L) 12/24/2016   HCT 33.8 (L) 12/24/2016   MCV 90.6 12/24/2016   PLT 158 12/24/2016   NEUTROABS 3.6 12/24/2016    ASSESSMENT & PLAN:  Breast  cancer of upper-outer quadrant of right female breast ([place]) 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 Followed by 12 cycles of weekly Abraxane completed 07/19/2014 S/P Mastectomy 09/22/14: Bilateral mastectomies: Right: IDC Grade 2, 2.7cm, LVI, PNI, 6/8 LN Positive, Er 90%, PR 5% T2N2 (Stage IIIA) Nonhealing right breast wound: finally healed Adjuvant radiation therapy started 03/22/2015 to complete 05/11/2015  Treatment Summary: antiestrogen therapy with anastrozole 1 mg daily Jan 2017- April 2017 (stopped when she got diagnosed with metastatic disease) Anastrozole toxicities: Hot flashes for which she is prescribed gabapentin 300 twice a day  PET/CT scan 08/08/2015: Multifocal areas of increased hypermetabolic is in new/progressive but without definite underlying lesion on CT scan suspicious for metastases, T5, T7, left sacrum, left iliac bone; 13 mm gastrohepatic node SUV 5.2 Suspicious for nodal metastases. ----------------------------------------------------------------------------------------------------------------------------------------------------------------- Treatment plan:Ibrance with Faslodex. Started 08/12/2015 ; discontinued 10/09/2016 due to progression Bone metastases XGeva once every 3 months (started 01/12/2016)  Pulmonary emboli: Currently on xarelto  10/02/2016 CT CAP: New hepatic metastases with the right and left hepatic lobes. New lung metastases. Stable sclerotic metastases within the pelvis and spine   Liver biopsy 10/15/2016: Metastatic carcinoma consistent with breast primary ER 100% PR 0%, HER-2 negative ratio 1.33  Treatment plan: 1. BRCA mutation analysis: Neg 2. 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 Exemestane with Everolimus started 11/15/2016  Exemestane and Everolimus toxicities: 1.  Mouth sore: Roof of the mouth feels sore especially with acidic foods: Patient is using dexamethasone mouth rinses Otherwise patient is tolerating it well.  Elevated alkaline phosphatase as well as LFTs: Possible etiologies are metastatic disease versus medications like statins versus fatty liver. The right is in the alkaline phosphatase is much less currently. I am hoping that it will slowly start to come down on recheck.  Return to clinic in 4 weeks for toxicity check and follow-up   I spent 25 minutes talking to the patient of which more than half was spent in counseling and coordination of care.  Orders Placed This Encounter  Procedures  . CBC with Differential    Standing Status:   Future    Standing Expiration Date:   12/24/2017  . Comprehensive metabolic panel    Standing Status:   Future    Standing  Expiration Date:   12/24/2017   The patient has a good understanding of the overall plan. she agrees with it. she will call with any problems that may develop before the next visit here.   Rulon Eisenmenger, MD 12/25/16

## 2016-12-25 ENCOUNTER — Encounter: Payer: Self-pay | Admitting: Hematology and Oncology

## 2016-12-25 DIAGNOSIS — E1165 Type 2 diabetes mellitus with hyperglycemia: Secondary | ICD-10-CM | POA: Diagnosis not present

## 2016-12-25 DIAGNOSIS — I10 Essential (primary) hypertension: Secondary | ICD-10-CM | POA: Diagnosis not present

## 2016-12-25 DIAGNOSIS — E7800 Pure hypercholesterolemia, unspecified: Secondary | ICD-10-CM | POA: Diagnosis not present

## 2017-01-02 MED FILL — AFINITOR 7.5 MG TAB: 7.5 | 28 days supply | Qty: 28 | Fill #2

## 2017-01-08 ENCOUNTER — Telehealth: Payer: Self-pay

## 2017-01-08 NOTE — Telephone Encounter (Signed)
Called and spoke with pt regarding her BLE edema. Pt states that since she last saw Dr.Gudena a few weeks ago, her BLE edema had gotten worse and is causing some difficulty with ambulation. Pt states that she takes lasix 20mg  and 40mg  alternating days. Pt feels that her edema is coming from her chemo medication. Pt would like to see MD sooner than 01/22/17. Pt experiencing some mild dyspnea on exertion as well. Denies any fevers, chills, chest pain, anuria, dizziness and bp issues. Told pt that we can have her see Lindsey,NP tomorrow to discuss her issues and medication management. Gave pt time/date of appt to confirm. Pt verbalized understanding. No further questions at this time.

## 2017-01-08 NOTE — Telephone Encounter (Signed)
error 

## 2017-01-09 ENCOUNTER — Encounter: Payer: Self-pay | Admitting: Adult Health

## 2017-01-09 ENCOUNTER — Ambulatory Visit (HOSPITAL_BASED_OUTPATIENT_CLINIC_OR_DEPARTMENT_OTHER): Payer: Medicare Other | Admitting: Adult Health

## 2017-01-09 ENCOUNTER — Other Ambulatory Visit (HOSPITAL_BASED_OUTPATIENT_CLINIC_OR_DEPARTMENT_OTHER): Payer: Medicare Other

## 2017-01-09 ENCOUNTER — Ambulatory Visit (HOSPITAL_BASED_OUTPATIENT_CLINIC_OR_DEPARTMENT_OTHER): Payer: Medicare Other

## 2017-01-09 VITALS — BP 93/45 | HR 80 | Temp 97.9°F | Resp 16 | Ht 69.0 in | Wt 291.5 lb

## 2017-01-09 DIAGNOSIS — Z452 Encounter for adjustment and management of vascular access device: Secondary | ICD-10-CM

## 2017-01-09 DIAGNOSIS — Z95828 Presence of other vascular implants and grafts: Secondary | ICD-10-CM

## 2017-01-09 DIAGNOSIS — C50411 Malignant neoplasm of upper-outer quadrant of right female breast: Secondary | ICD-10-CM

## 2017-01-09 DIAGNOSIS — C7951 Secondary malignant neoplasm of bone: Secondary | ICD-10-CM

## 2017-01-09 DIAGNOSIS — Z170 Estrogen receptor positive status [ER+]: Secondary | ICD-10-CM

## 2017-01-09 DIAGNOSIS — Z7901 Long term (current) use of anticoagulants: Secondary | ICD-10-CM | POA: Diagnosis not present

## 2017-01-09 DIAGNOSIS — I2699 Other pulmonary embolism without acute cor pulmonale: Secondary | ICD-10-CM | POA: Diagnosis not present

## 2017-01-09 DIAGNOSIS — M7989 Other specified soft tissue disorders: Secondary | ICD-10-CM

## 2017-01-09 LAB — CBC WITH DIFFERENTIAL/PLATELET
BASO%: 0.7 % (ref 0.0–2.0)
BASOS ABS: 0 10*3/uL (ref 0.0–0.1)
EOS ABS: 0 10*3/uL (ref 0.0–0.5)
EOS%: 1.4 % (ref 0.0–7.0)
HEMATOCRIT: 29.1 % — AB (ref 34.8–46.6)
HEMOGLOBIN: 9.2 g/dL — AB (ref 11.6–15.9)
LYMPH#: 0.9 10*3/uL (ref 0.9–3.3)
LYMPH%: 29.3 % (ref 14.0–49.7)
MCH: 27.8 pg (ref 25.1–34.0)
MCHC: 31.6 g/dL (ref 31.5–36.0)
MCV: 87.9 fL (ref 79.5–101.0)
MONO#: 0.3 10*3/uL (ref 0.1–0.9)
MONO%: 8.5 % (ref 0.0–14.0)
NEUT#: 1.8 10*3/uL (ref 1.5–6.5)
NEUT%: 60.1 % (ref 38.4–76.8)
PLATELETS: 200 10*3/uL (ref 145–400)
RBC: 3.31 10*6/uL — ABNORMAL LOW (ref 3.70–5.45)
RDW: 17.1 % — AB (ref 11.2–14.5)
WBC: 2.9 10*3/uL — ABNORMAL LOW (ref 3.9–10.3)

## 2017-01-09 LAB — COMPREHENSIVE METABOLIC PANEL
ALBUMIN: 2.3 g/dL — AB (ref 3.5–5.0)
ALK PHOS: 626 U/L — AB (ref 40–150)
ALT: 67 U/L — ABNORMAL HIGH (ref 0–55)
ANION GAP: 9 meq/L (ref 3–11)
AST: 90 U/L — AB (ref 5–34)
BUN: 38.7 mg/dL — AB (ref 7.0–26.0)
CHLORIDE: 113 meq/L — AB (ref 98–109)
CO2: 18 mEq/L — ABNORMAL LOW (ref 22–29)
Calcium: 8.9 mg/dL (ref 8.4–10.4)
Creatinine: 1.3 mg/dL — ABNORMAL HIGH (ref 0.6–1.1)
EGFR: 42 mL/min/{1.73_m2} — AB (ref >90)
Glucose: 181 mg/dl — ABNORMAL HIGH (ref 70–140)
POTASSIUM: 4.1 meq/L (ref 3.5–5.1)
Sodium: 139 mEq/L (ref 136–145)
Total Bilirubin: 1.74 mg/dL — ABNORMAL HIGH (ref 0.20–1.20)
Total Protein: 6.2 g/dL — ABNORMAL LOW (ref 6.4–8.3)

## 2017-01-09 MED ORDER — HEPARIN SOD (PORK) LOCK FLUSH 100 UNIT/ML IV SOLN
500.0000 [IU] | Freq: Once | INTRAVENOUS | Status: AC | PRN
  Administered 2017-01-09: 500 [IU] via INTRAVENOUS
  Filled 2017-01-09: qty 5

## 2017-01-09 MED ORDER — DOXYCYCLINE HYCLATE 100 MG PO TABS: 100.0000 mg | ORAL_TABLET | Freq: Two times a day (BID) | ORAL | 0 refills | Status: AC

## 2017-01-09 MED ORDER — SODIUM CHLORIDE 0.9 % IJ SOLN
10.0000 mL | INTRAMUSCULAR | Status: DC | PRN
  Administered 2017-01-09: 10 mL via INTRAVENOUS
  Filled 2017-01-09: qty 10

## 2017-01-09 MED FILL — DOXYCYCLINE HYC 100 MG TAB: 100 | 10 days supply | Qty: 20 | Fill #0

## 2017-01-09 NOTE — Assessment & Plan Note (Signed)
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 Followed by 12 cycles of weekly Abraxane completed 07/19/2014 S/P Mastectomy 09/22/14: Bilateral mastectomies: Right: IDC Grade 2, 2.7cm, LVI, PNI, 6/8 LN Positive, Er 90%, PR 5% T2N2 (Stage IIIA) Nonhealing right breast wound: finally healed Adjuvant radiation therapy started 03/22/2015 to complete 05/11/2015  Treatment Summary: antiestrogen therapy with anastrozole 1 mg daily Jan 2017- April 2017 (stopped when she got diagnosed with metastatic disease) Anastrozole toxicities: Hot flashes for which she is prescribed gabapentin 300 twice a day  PET/CT scan 08/08/2015: Multifocal areas of increased hypermetabolic is in new/progressive but without definite underlying lesion on CT scan suspicious for metastases, T5, T7, left sacrum, left iliac bone; 13 mm gastrohepatic node SUV 5.2 Suspicious for nodal metastases. ----------------------------------------------------------------------------------------------------------------------------------------------------------------- Treatment plan:Ibrance with Faslodex. Started 08/12/2015 ; discontinued 10/09/2016 due to progression Bone metastases XGeva once every 3 months (started 01/12/2016)  Pulmonary emboli: Currently on xarelto  10/02/2016 CT CAP: New hepatic metastases with the right and left hepatic lobes. New lung metastases. Stable sclerotic metastases within the pelvis and spine   Liver biopsy 10/15/2016: Metastatic carcinoma consistent with breast primary ER 100% PR 0%, HER-2 negative ratio 1.33  Treatment plan: 1. BRCA mutation analysis: Neg 2. 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 Exemestane with Everolimus started 11/15/2016  Exemestane and Everolimus: 1. Mouth sore: Roof of the mouth feels sore especially with acidic foods:  Patient is using dexamethasone mouth rinses  Other issues:  Unsure if leg swelling is related to Everolimus or not.  At any rate, I have ordered bilateral lower extremity dopplers, a course of Doxycycline, and she will hold the everolimus to see if it improves.  Due to all of this and the fact that she hasn't had restaging since 5/29, we will also do restaging with CT chest, abdomen, and pelvis, along with bone scan.  I recommended continued high protein diet as well.  Once we get the above mentioned scans scheduled, I will schedule f/u with Dr. Lindi Adie shortly thereafter.

## 2017-01-09 NOTE — Progress Notes (Signed)
[facility] Follow up:    Tonya Alvarez, [place] [address]   DIAGNOSIS: Cancer Staging Breast cancer of upper-outer quadrant of right female breast [facility]) Staging form: Breast, AJCC 7th Edition - Clinical: Stage IIIA (T3, N1, M1) - Signed by Rulon Eisenmenger, MD on 03/05/2014 - Pathologic: T3, N1a, M1 - Unsigned   SUMMARY OF ONCOLOGIC HISTORY:   Breast cancer of upper-outer quadrant of right female breast ([place])   02/02/2014 Mammogram    Right breast: 5.8 cm irregular high-density solid mass suggestive of malignancy; left breast next millimeter internal mammary lymph node      02/04/2014 Initial Biopsy     2 biopsies were performed the right breast and axilla: Grade 2 invasive ductal carcinoma ER/PR positive HER-2 negative Ki-67 20% to 47%: Biopsy of right humerus metastatic breast cancer estrogen positive      02/17/2014 PET scan    Hypermetabolic right breast cancer and right axillary lymph nodes subpectoral lymph nodes indeterminate right middle lobe pulmonary nodule and hypermetabolic proximal right humerus lesion      03/05/2014 - 07/19/2014 Neo-Adjuvant Chemotherapy    Dose dense Adriamycin and Cytoxan x4 cycles followed by Abraxane weekly x12      07/26/2014 Breast MRI    Multifocal breast cancer decreased in size, retroareolar 13 mm now 11 mm, middle third 22 mm now 18 mm, 3.9 x 3.9 cm now 3.9 x 2.6 cm, multiple smaller nodules decreased in size, right x-ray lymph node 3.6 cm now 2 cm other lymph nodes are smaller      07/28/2014 - 08/12/2015 Anti-estrogen oral therapy    Anastrozole 1 mg daily      08/02/2014 PET scan    Interval improvement in the right breast mass and axillary/subpectoral lymph nodes and right humerus head. Several subpleural nodules are not visible, 3.4. cm adrenal adenoma      09/22/2014 Surgery    Bilateral mastectomy: Right: IDC Grade 2, 2.7cm, LVI, PNI, 6/8 LN Positive, Er 90%, PR 5% T2N2       03/22/2015 -  Radiation Therapy    adjuvant radiation therapy with Dr. Isidore Moos      08/10/2015 PET scan    Progression of disease with several new bone metastases, T5, T7, left sacrum, left iliac bone, gastrohepatic lymph node      08/12/2015 - 10/30/2016 Anti-estrogen oral therapy    Faslodex with [facility] every 3 months for bone metastases (stopped for progression of liver metastases)      11/12/2016 -  Anti-estrogen oral therapy    Exemestane Everolimus       CURRENT THERAPY: Exemestane and Everolimus  INTERVAL HISTORY: Tonya Alvarez 71 y.o. female returns for evaluation of lower extremity swelling and erythema.  She says the edema was only in her feet about two weeks ago, now it is up her calves.  She was told it was partially due to her low albumin.  She is taking lasix, and is urinating well, however it doesn't seem to help with her feet.  She takes 90m lasix every other day, alternating with 288mevery other day.  This is the max she is able to tolerate.  If she takes more, then she gets terrible cramps.  Her feet have increasing pain, left worse than right.  She does have h/o blood clots in her legs and h/o pulmonary embolus and she takes Xarelto compliantly 2028mer day.  She did develop a blister on her  right lower leg that she does think is healing.     Patient Active Problem List   Diagnosis Date Noted  . Genetic testing 11/15/2016  . Pulmonary embolism ([place]) 10/22/2015  . PE (pulmonary embolism) 10/22/2015  . Diabetes mellitus without complication ([place])   . Port catheter in place 09/09/2015  . Bone metastases ([place]) 04/12/2015  . Hyperkalemia 04/02/2014  . Hypertension 04/02/2014  . Breast cancer of upper-outer quadrant of right female breast ([place]) 02/09/2014    is allergic to amoxicillin; hydrocodone; and lisinopril.  MEDICAL HISTORY: Past Medical History:  Diagnosis Date  . Arthritis    "knees, ankles" (09/22/2014  . Breast cancer ([place]) 02/04/14   right  breast  . Cancer of right breast ([place])   . Chronic lower back pain   . Complication of anesthesia    O2 SAT  DROP   . DDD (degenerative disc disease), cervical   . DDD (degenerative disc disease), lumbosacral   . DDD (degenerative disc disease), thoracolumbar   . Diabetes mellitus without complication ([place])   . Diabetic peripheral neuropathy ([place])   . Heart murmur    mitral valve  . Hyperlipidemia   . Hypertension   . Mitral regurgitation   . Neuromuscular disorder ([place])   . Sinus headache    "seasonal"  . Type II diabetes mellitus ([place])     SURGICAL HISTORY: Past Surgical History:  Procedure Laterality Date  . BREAST BIOPSY Right 02/2014  . JOINT REPLACEMENT    . MASTECTOMY COMPLETE / SIMPLE Left 09/22/2014  . MASTECTOMY MODIFIED RADICAL Right 09/22/2014  . MASTECTOMY MODIFIED RADICAL Right 09/22/2014   Procedure:  RIGHT MASTECTOMY MODIFIED RADICAL;  Surgeon: Coralie Keens, MD;  Location: [place];  Service: General;  Laterality: Right;  . PORTACATH PLACEMENT Right 02/23/2014   power port, tip cavo-atrial junction  . SIMPLE MASTECTOMY WITH AXILLARY SENTINEL NODE BIOPSY Left 09/22/2014   Procedure: LEFT SIMPLE MASTECTOMY;  Surgeon: Coralie Keens, MD;  Location: [place];  Service: General;  Laterality: Left;  . TONSILLECTOMY  1955  . TOTAL KNEE ARTHROPLASTY Left ~ 2009  . TUBAL LIGATION  1980    SOCIAL HISTORY: Social History   Social History  . Marital status: Widowed    Spouse name: N/A  . Number of children: N/A  . Years of education: N/A   Occupational History  . Not on file.   Social History Main Topics  . Smoking status: Former Smoker    Packs/day: 0.75    Years: 30.00    Types: Cigarettes    Quit date: 05/07/2013  . Smokeless tobacco: Never Used  . Alcohol use No     Comment: 09/22/2014 "I'll drink a few times/yr"  . Drug use: No  . Sexual activity: Yes   Other Topics Concern  . Not on file   Social History Narrative  . No narrative on file     FAMILY HISTORY: Family History  Problem Relation Age of Onset  . Cancer Father        lung cancer  . Cancer Maternal Aunt        Kidney  . Hypertension Maternal Uncle     Review of Systems  Constitutional: Negative for appetite change, chills, fatigue, fever and unexpected weight change.  HENT:   Negative for hearing loss and lump/mass.   Eyes: Negative for eye problems and icterus.  Respiratory: Negative for chest tightness, cough and shortness of breath.   Cardiovascular: Positive for leg swelling. Negative for chest pain and  palpitations.  Gastrointestinal: Negative for abdominal distention, abdominal pain, constipation, diarrhea, nausea and vomiting.  Endocrine: Negative for hot flashes.  Genitourinary: Negative for difficulty urinating.   Musculoskeletal: Negative for arthralgias.  Skin: Negative for itching and rash.  Neurological: Negative for dizziness, extremity weakness, headaches and light-headedness.  Hematological: Negative for adenopathy.  Psychiatric/Behavioral: Negative for depression. The patient is not nervous/anxious.       PHYSICAL EXAMINATION  ECOG PERFORMANCE STATUS: 3 - Symptomatic, >50% confined to bed  Vitals:   01/09/17 1111  BP: (!) 93/45  Pulse: 80  Resp: 16  Temp: 97.9 F (36.6 C)  SpO2: 100%    Physical Exam  Constitutional: She is oriented to person, place, and time and well-developed, well-nourished, and in no distress.  HENT:  Head: Normocephalic and atraumatic.  Mouth/Throat: Oropharynx is clear and moist. No oropharyngeal exudate.  Eyes: Pupils are equal, round, and reactive to light. No scleral icterus.  Neck: Neck supple.  Cardiovascular: Normal rate, regular rhythm and normal heart sounds.   Pulmonary/Chest: Effort normal and breath sounds normal. No respiratory distress. She has no wheezes.  Abdominal: Soft. Bowel sounds are normal. She exhibits no distension. There is no tenderness.  Musculoskeletal: She exhibits edema  (bilateral lower extremity, tender to touch).  Lymphadenopathy:    She has no cervical adenopathy.  Neurological: She is alert and oriented to person, place, and time.  Skin:  Bilateral lower ext with erythema, slight warmth, blister on right lower leg covered with bandage  Psychiatric: Mood and affect normal.    LABORATORY DATA:  CBC    Component Value Date/Time   WBC 2.9 (L) 01/09/2017 1028   WBC 4.2 10/15/2016 1230   RBC 3.31 (L) 01/09/2017 1028   RBC 3.44 (L) 10/15/2016 1230   HGB 9.2 (L) 01/09/2017 1028   HCT 29.1 (L) 01/09/2017 1028   PLT 200 01/09/2017 1028   MCV 87.9 01/09/2017 1028   MCH 27.8 01/09/2017 1028   MCH 32.3 10/15/2016 1230   MCHC 31.6 01/09/2017 1028   MCHC 32.9 10/15/2016 1230   RDW 17.1 (H) 01/09/2017 1028   LYMPHSABS 0.9 01/09/2017 1028   MONOABS 0.3 01/09/2017 1028   EOSABS 0.0 01/09/2017 1028   BASOSABS 0.0 01/09/2017 1028    CMP     Component Value Date/Time   NA 139 01/09/2017 1028   K 4.1 01/09/2017 1028   CL 107 10/24/2015 0431   CO2 18 (L) 01/09/2017 1028   GLUCOSE 181 (H) 01/09/2017 1028   BUN 38.7 (H) 01/09/2017 1028   CREATININE 1.3 (H) 01/09/2017 1028   CALCIUM 8.9 01/09/2017 1028   PROT 6.2 (L) 01/09/2017 1028   ALBUMIN 2.3 (L) 01/09/2017 1028   AST 90 (H) 01/09/2017 1028   ALT 67 (H) 01/09/2017 1028   ALKPHOS 626 (H) 01/09/2017 1028   BILITOT 1.74 (H) 01/09/2017 1028   GFRNONAA >60 10/24/2015 0431   GFRAA >60 10/24/2015 0431      ASSESSMENT and PLAN:   Breast cancer of upper-outer quadrant of right female breast ([employer]) 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 Followed by 12 cycles of weekly Abraxane completed 07/19/2014 S/P Mastectomy 09/22/14: Bilateral mastectomies: Right: IDC Grade 2, 2.7cm, LVI, PNI, 6/8 LN Positive, Er 90%, PR 5% T2N2 (Stage  IIIA) Nonhealing right breast wound: finally healed Adjuvant radiation therapy started 03/22/2015 to complete 05/11/2015  Treatment Summary: antiestrogen therapy with anastrozole 1 mg daily Jan 2017- April 2017 (stopped when she got diagnosed with metastatic disease) Anastrozole toxicities: Hot flashes for which she is prescribed gabapentin 300 twice a day  PET/CT scan 08/08/2015: Multifocal areas of increased hypermetabolic is in new/progressive but without definite underlying lesion on CT scan suspicious for metastases, T5, T7, left sacrum, left iliac bone; 13 mm gastrohepatic node SUV 5.2 Suspicious for nodal metastases. ----------------------------------------------------------------------------------------------------------------------------------------------------------------- Treatment plan:Ibrance with Faslodex. Started 08/12/2015 ; discontinued 10/09/2016 due to progression Bone metastases XGeva once every 3 months (started 01/12/2016)  Pulmonary emboli: Currently on xarelto  10/02/2016 CT CAP: New hepatic metastases with the right and left hepatic lobes. New lung metastases. Stable sclerotic metastases within the pelvis and spine   Liver biopsy 10/15/2016: Metastatic carcinoma consistent with breast primary ER 100% PR 0%, HER-2 negative ratio 1.33  Treatment plan: 1. BRCA mutation analysis: Neg 2. 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 Exemestane with Everolimus started 11/15/2016  Exemestane and Everolimus: 1. Mouth sore: Roof of the mouth feels sore especially with acidic foods: Patient is using dexamethasone mouth rinses  Other issues:  Unsure if leg swelling is related to Everolimus or not.  At any rate, I have ordered bilateral lower extremity dopplers, a course of Doxycycline, and she will hold the everolimus to see if it improves.  Due to all of this and the fact that she hasn't had restaging since 5/29, we will also do restaging with CT chest, abdomen, and pelvis,  along with bone scan.  I recommended continued high protein diet as well.  Once we get the above mentioned scans scheduled, I will schedule f/u with Dr. Lindi Adie shortly thereafter.    The above was discussed with Dr. Lindi Adie in detail who assisted in formulating the plan and is in agreement with it.    A total of (30) minutes of face-to-face time was spent with this patient with greater than 50% of that time in counseling and care-coordination.   All questions were answered. The patient knows to call the clinic with any problems, questions or concerns. We can certainly see the patient much sooner if necessary. This note was electronically signed. Scot Dock, NP 01/09/2017

## 2017-01-15 DIAGNOSIS — E7800 Pure hypercholesterolemia, unspecified: Secondary | ICD-10-CM | POA: Diagnosis not present

## 2017-01-21 ENCOUNTER — Ambulatory Visit (HOSPITAL_COMMUNITY)
Admission: RE | Admit: 2017-01-21 | Discharge: 2017-01-21 | Disposition: A | Discharge status: Home / Self Care | Payer: Medicare Other | Source: Ambulatory Visit | Attending: Adult Health | Admitting: Adult Health

## 2017-01-21 ENCOUNTER — Ambulatory Visit: Admission: RE | Admit: 2017-01-21 | Payer: Medicare Other | Source: Ambulatory Visit

## 2017-01-21 ENCOUNTER — Telehealth: Payer: Self-pay | Admitting: Adult Health

## 2017-01-21 ENCOUNTER — Encounter: Admission: RE | Admit: 2017-01-21 | Payer: Medicare Other | Source: Ambulatory Visit

## 2017-01-21 DIAGNOSIS — M7989 Other specified soft tissue disorders: Secondary | ICD-10-CM | POA: Insufficient documentation

## 2017-01-21 DIAGNOSIS — C50919 Malignant neoplasm of unspecified site of unspecified female breast: Secondary | ICD-10-CM | POA: Insufficient documentation

## 2017-01-21 DIAGNOSIS — M7121 Synovial cyst of popliteal space [Baker], right knee: Secondary | ICD-10-CM | POA: Insufficient documentation

## 2017-01-21 DIAGNOSIS — C50411 Malignant neoplasm of upper-outer quadrant of right female breast: Secondary | ICD-10-CM | POA: Diagnosis not present

## 2017-01-21 DIAGNOSIS — Z170 Estrogen receptor positive status [ER+]: Secondary | ICD-10-CM

## 2017-01-21 NOTE — Progress Notes (Signed)
VASCULAR LAB PRELIMINARY  PRELIMINARY  PRELIMINARY  PRELIMINARY  Bilateral lower extremity venous duplex completed.    Preliminary report: Technically difficult due to body habitus. Bilateral - no obvious evidence of DVT or superficial thrombus. There is evidence of a Baker's cyst noted in the right popliteal fossa. No evidence of a Baker's cyst on the left.  Wirt Hemmerich, RVS 01/21/2017, 12:09 PM

## 2017-01-22 ENCOUNTER — Other Ambulatory Visit: Payer: Self-pay | Admitting: Hematology and Oncology

## 2017-01-22 ENCOUNTER — Other Ambulatory Visit: Payer: Medicare Other

## 2017-01-22 ENCOUNTER — Ambulatory Visit: Payer: Medicare Other | Admitting: Hematology and Oncology

## 2017-01-22 ENCOUNTER — Telehealth: Payer: Self-pay

## 2017-01-22 NOTE — Telephone Encounter (Signed)
Pt called in to notify MD that her pet scan was rescheduled to 9/28. It was originally scheduled yesterday after her doppler ultrasound, but it was scheduled for [place] [facility]. Pt did not want to travel that far and rescheduled for next week, which was first available. Scheduled pt to see Dr. Lindi Adie to discuss results on 10/1. Pt confirmed appt/time.    Asked how pt was doing with her leg swelling. Pt states that she has been taking lasix 40 and 20mg  alternating everyday.Pt states that she feels a little improved. Denies any acute cp, sob, or increase in edema at this time. Told pt to report any worsening of symptoms. Pt verbalized understanding and appreciative of call today. No further concerns at this time.

## 2017-01-23 ENCOUNTER — Telehealth: Payer: Self-pay

## 2017-01-23 NOTE — Telephone Encounter (Signed)
Tried calling patient. Unable to leave a message. Mauled letter with upcoming appointment. Per 9/18 los.

## 2017-01-28 ENCOUNTER — Other Ambulatory Visit: Payer: Self-pay | Admitting: Hematology and Oncology

## 2017-01-30 MED FILL — AFINITOR 7.5 MG TAB: 7.5 | 28 days supply | Qty: 28 | Fill #3

## 2017-02-01 ENCOUNTER — Encounter (HOSPITAL_COMMUNITY)
Admission: RE | Admit: 2017-02-01 | Discharge: 2017-02-01 | Disposition: A | Discharge status: Home / Self Care | Payer: Medicare Other | Source: Ambulatory Visit | Attending: Adult Health | Admitting: Adult Health

## 2017-02-01 ENCOUNTER — Ambulatory Visit (HOSPITAL_COMMUNITY)
Admission: RE | Admit: 2017-02-01 | Discharge: 2017-02-01 | Disposition: A | Discharge status: Home / Self Care | Payer: Medicare Other | Source: Ambulatory Visit | Attending: Adult Health | Admitting: Adult Health

## 2017-02-01 ENCOUNTER — Other Ambulatory Visit: Payer: Self-pay | Admitting: Adult Health

## 2017-02-01 ENCOUNTER — Encounter (HOSPITAL_COMMUNITY): Payer: Self-pay

## 2017-02-01 DIAGNOSIS — C50411 Malignant neoplasm of upper-outer quadrant of right female breast: Secondary | ICD-10-CM

## 2017-02-01 DIAGNOSIS — D3501 Benign neoplasm of right adrenal gland: Secondary | ICD-10-CM | POA: Diagnosis not present

## 2017-02-01 DIAGNOSIS — I700 Atherosclerosis of aorta: Secondary | ICD-10-CM | POA: Diagnosis not present

## 2017-02-01 DIAGNOSIS — R188 Other ascites: Secondary | ICD-10-CM | POA: Diagnosis not present

## 2017-02-01 DIAGNOSIS — Z170 Estrogen receptor positive status [ER+]: Secondary | ICD-10-CM | POA: Insufficient documentation

## 2017-02-01 DIAGNOSIS — C50919 Malignant neoplasm of unspecified site of unspecified female breast: Secondary | ICD-10-CM | POA: Diagnosis not present

## 2017-02-01 DIAGNOSIS — C787 Secondary malignant neoplasm of liver and intrahepatic bile duct: Secondary | ICD-10-CM | POA: Diagnosis not present

## 2017-02-01 DIAGNOSIS — I2510 Atherosclerotic heart disease of native coronary artery without angina pectoris: Secondary | ICD-10-CM | POA: Diagnosis not present

## 2017-02-01 DIAGNOSIS — K8020 Calculus of gallbladder without cholecystitis without obstruction: Secondary | ICD-10-CM | POA: Insufficient documentation

## 2017-02-01 DIAGNOSIS — C50911 Malignant neoplasm of unspecified site of right female breast: Secondary | ICD-10-CM | POA: Diagnosis not present

## 2017-02-01 DIAGNOSIS — C7951 Secondary malignant neoplasm of bone: Secondary | ICD-10-CM | POA: Insufficient documentation

## 2017-02-01 DIAGNOSIS — R918 Other nonspecific abnormal finding of lung field: Secondary | ICD-10-CM | POA: Diagnosis not present

## 2017-02-01 MED ORDER — FLUDEOXYGLUCOSE F - 18 (FDG) INJECTION: 22.0000 | Freq: Once | INTRAVENOUS | Status: DC | PRN

## 2017-02-01 MED ORDER — IOPAMIDOL (ISOVUE-300) INJECTION 61%
80.0000 mL | Freq: Once | INTRAVENOUS | Status: AC | PRN
  Administered 2017-02-01: 80 mL via INTRAVENOUS

## 2017-02-01 MED ORDER — HEPARIN SOD (PORK) LOCK FLUSH 100 UNIT/ML IV SOLN
INTRAVENOUS | Status: AC
  Filled 2017-02-01: qty 5

## 2017-02-01 MED ORDER — TECHNETIUM TC 99M MEDRONATE IV KIT
25.0000 | PACK | Freq: Once | INTRAVENOUS | Status: AC | PRN
  Administered 2017-02-01: 22 via INTRAVENOUS

## 2017-02-01 MED ORDER — HEPARIN SOD (PORK) LOCK FLUSH 100 UNIT/ML IV SOLN
500.0000 [IU] | Freq: Once | INTRAVENOUS | Status: AC
  Administered 2017-02-01: 500 [IU] via INTRAVENOUS

## 2017-02-01 MED ORDER — IOPAMIDOL (ISOVUE-300) INJECTION 61%
INTRAVENOUS | Status: AC
  Filled 2017-02-01: qty 100

## 2017-02-01 NOTE — Telephone Encounter (Signed)
error 

## 2017-02-04 ENCOUNTER — Ambulatory Visit (HOSPITAL_BASED_OUTPATIENT_CLINIC_OR_DEPARTMENT_OTHER): Payer: Medicare Other | Admitting: Hematology and Oncology

## 2017-02-04 ENCOUNTER — Other Ambulatory Visit: Payer: Self-pay | Admitting: Pharmacist

## 2017-02-04 VITALS — HR 95 | Temp 98.0°F | Resp 16 | Ht 69.0 in | Wt 296.5 lb

## 2017-02-04 DIAGNOSIS — Z170 Estrogen receptor positive status [ER+]: Secondary | ICD-10-CM | POA: Diagnosis not present

## 2017-02-04 DIAGNOSIS — C7951 Secondary malignant neoplasm of bone: Secondary | ICD-10-CM | POA: Diagnosis not present

## 2017-02-04 DIAGNOSIS — C50411 Malignant neoplasm of upper-outer quadrant of right female breast: Secondary | ICD-10-CM

## 2017-02-04 DIAGNOSIS — Z7189 Other specified counseling: Secondary | ICD-10-CM

## 2017-02-04 DIAGNOSIS — C787 Secondary malignant neoplasm of liver and intrahepatic bile duct: Secondary | ICD-10-CM | POA: Diagnosis not present

## 2017-02-04 MED ORDER — LIDOCAINE-PRILOCAINE 2.5-2.5 % EX CREA: TOPICAL_CREAM | CUTANEOUS | 3 refills | Status: DC

## 2017-02-04 MED ORDER — PROCHLORPERAZINE MALEATE 10 MG PO TABS
10.0000 mg | ORAL_TABLET | Freq: Four times a day (QID) | ORAL | 1 refills | Status: DC | PRN
Start: 2017-02-04 — End: 2017-04-10

## 2017-02-04 MED ORDER — ONDANSETRON HCL 8 MG PO TABS: 8.0000 mg | ORAL_TABLET | Freq: Two times a day (BID) | ORAL | 1 refills | Status: DC | PRN

## 2017-02-04 MED FILL — ONETOUCH VERIO TEST STRIP: 33 days supply | Qty: 100 | Fill #0

## 2017-02-04 NOTE — Assessment & Plan Note (Signed)
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 Followed by 12 cycles of weekly Abraxane completed 07/19/2014 S/P Mastectomy 09/22/14: Bilateral mastectomies: Right: IDC Grade 2, 2.7cm, LVI, PNI, 6/8 LN Positive, Er 90%, PR 5% T2N2 (Stage IIIA) Nonhealing right breast wound: finally healed Adjuvant radiation therapy started 03/22/2015 to complete 05/11/2015  Current treatment: Exemestane with Everolimus  PET/CT scan 08/08/2015: Multifocal areas of increased hypermetabolic is in new/progressive but without definite underlying lesion on CT scan suspicious for metastases, T5, T7, left sacrum, left iliac bone; 13 mm gastrohepatic node SUV 5.2 Suspicious for nodal metastases. ----------------------------------------------------------------------------------------------------------------------------------------------------------------- Treatment plan:Ibrance with Faslodex. Started 08/12/2015 ; discontinued 10/09/2016 due to progression Bone metastases XGeva once every 3 months (started 01/12/2016)  Pulmonary emboli: Currently on xarelto  10/02/2016 CT CAP: New hepatic metastases with the right and left hepatic lobes. New lung metastases. Stable sclerotic metastases within the pelvis and spine   Liver biopsy 10/15/2016: Metastatic carcinoma consistent with breast primary ER 100% PR 0%, HER-2 negative ratio 1.33  Treatment plan: 1. BRCA mutation analysis: Neg 2. 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 Exemestane with Everolimus started 11/15/2016  02/01/2017 CT CAP: Worsening progressive tumor throughout the liver with new and enlarging masses, stable bone metastases; 0.9 x 1.1 cm right apical lung nodule  Because of worsening disease, I recommended systemic chemotherapy with carboplatin and gemcitabine

## 2017-02-04 NOTE — Progress Notes (Signed)
Patient Care Team: Jacelyn Pi, MD as PCP - General (Endocrinology)  DIAGNOSIS:  Encounter Diagnoses  Name Primary?  . Malignant neoplasm of upper-outer quadrant of right breast in female, estrogen receptor positive ([place]) Yes  . Goals of care, counseling/discussion     SUMMARY OF ONCOLOGIC HISTORY:   Breast cancer of upper-outer quadrant of right female breast ([place])   02/02/2014 Mammogram    Right breast: 5.8 cm irregular high-density solid mass suggestive of malignancy; left breast next millimeter internal mammary lymph node      02/04/2014 Initial Biopsy     2 biopsies were performed the right breast and axilla: Grade 2 invasive ductal carcinoma ER/PR positive HER-2 negative Ki-67 20% to 47%: Biopsy of right humerus metastatic breast cancer estrogen positive      02/17/2014 PET scan    Hypermetabolic right breast cancer and right axillary lymph nodes subpectoral lymph nodes indeterminate right middle lobe pulmonary nodule and hypermetabolic proximal right humerus lesion      03/05/2014 - 07/19/2014 Neo-Adjuvant Chemotherapy    Dose dense Adriamycin and Cytoxan x4 cycles followed by Abraxane weekly x12      07/26/2014 Breast MRI    Multifocal breast cancer decreased in size, retroareolar 13 mm now 11 mm, middle third 22 mm now 18 mm, 3.9 x 3.9 cm now 3.9 x 2.6 cm, multiple smaller nodules decreased in size, right x-ray lymph node 3.6 cm now 2 cm other lymph nodes are smaller      07/28/2014 - 08/12/2015 Anti-estrogen oral therapy    Anastrozole 1 mg daily      08/02/2014 PET scan    Interval improvement in the right breast mass and axillary/subpectoral lymph nodes and right humerus head. Several subpleural nodules are not visible, 3.4. cm adrenal adenoma      09/22/2014 Surgery    Bilateral mastectomy: Right: IDC Grade 2, 2.7cm, LVI, PNI, 6/8 LN Positive, Er 90%, PR 5% T2N2      03/22/2015 -  Radiation Therapy    adjuvant radiation therapy with Dr. Isidore Moos      08/10/2015 PET scan    Progression of disease with several new bone metastases, T5, T7, left sacrum, left iliac bone, gastrohepatic lymph node      08/12/2015 - 10/30/2016 Anti-estrogen oral therapy    Faslodex with Tonya Alvarez every 3 months for bone metastases (stopped for progression of liver metastases)      11/12/2016 -  Anti-estrogen oral therapy    Exemestane Everolimus      02/01/2017 Imaging    CT CAP: Worsening progressive tumor throughout the liver with new and enlarging masses, stable bone metastases; 0.9 x 1.1 cm right apical lung nodule       CHIEF COMPLIANT: Follow-up to discuss the scans  INTERVAL HISTORY: Tonya Alvarez is a 71 year old above-mentioned history metastatic breast cancer who had recent CT chest abdomen pelvis and is here today to discuss the results. She is in a wheelchair today. She feels weak and significantly swollen in the legs and belly. She has marked discomfort in the legs as well. She has been taking Lasix which has not been helping her significantly. She is also been nauseated and has poor appetite and has not been eating as much.  REVIEW OF SYSTEMS:   Constitutional: Denies fevers, chills or abnormal weight loss Eyes: Denies blurriness of vision Ears, nose, mouth, throat, and face: Denies mucositis or sore throat Respiratory: Denies cough, dyspnea or wheezes Cardiovascular: Denies palpitation, chest discomfort Gastrointestinal:  Abdominal  distention Skin: Denies abnormal skin rashes Lymphatics: Denies new lymphadenopathy or easy bruising Neurological:Denies numbness, tingling or new weaknesses Behavioral/Psych: Mood is stable, no new changes  Extremities: Profound lower extremity edema  All other systems were reviewed with the patient and are negative.  I have reviewed the past medical history, past surgical history, social history and family history with the patient and they are unchanged from previous note.  ALLERGIES:  is allergic to  amoxicillin; hydrocodone; and lisinopril.  MEDICATIONS:  Current Outpatient Prescriptions  Medication Sig Dispense Refill  . calcium-vitamin D (OSCAL WITH D) 500-200 MG-UNIT per tablet Take 1 tablet by mouth daily.    . clindamycin (CLEOCIN) 300 MG capsule Take 900 mg by mouth See admin instructions. Take 3 capsules (900 mg) by mouth one hour prior to dental appointment - last appointment October 11, 2015    . doxycycline (VIBRA-TABS) 100 MG tablet Take 1 tablet (100 mg total) by mouth 2 (two) times daily. 20 tablet 0  . fluticasone (FLONASE) 50 MCG/ACT nasal spray Place 1 spray into both nostrils daily as needed for allergies or rhinitis. (Patient taking differently: Place 1 spray into both nostrils at bedtime as needed for allergies or rhinitis. ) 16 g 0  . furosemide (LASIX) 40 MG tablet TAKE 1 TABLET BY MOUTH  DAILY 90 tablet 1  . gabapentin (NEURONTIN) 300 MG capsule Take 300 mg by mouth 3 (three) times daily.     Marland Kitchen ibuprofen (ADVIL,MOTRIN) 600 MG tablet Take 600 mg by mouth every 12 (twelve) hours.    . insulin lispro protamine-lispro (HUMALOG 75/25 MIX) (75-25) 100 UNIT/ML SUSP injection Inject 10-50 Units into the skin See admin instructions. Inject 50 units subcutaneously with breakfast and supper, inject 10 mls after lunch if CBG >150    . lidocaine-prilocaine (EMLA) cream Apply 1 application topically as needed. (Patient not taking: Reported on 01/09/2017) 30 g 6  . lidocaine-prilocaine (EMLA) cream Apply to affected area once 30 g 3  . Liniments (S[place]ONPAS PAIN RELIEF PATCH EX) Place 1 patch onto the skin daily as needed (PAIN).    Marland Kitchen losartan (COZAAR) 50 MG tablet Take 50 mg by mouth daily.     . metFORMIN (GLUCOPHAGE-XR) 500 MG 24 hr tablet Take 2 tablets (1,000 mg total) by mouth 2 (two) times daily. (Patient not taking: Reported on 01/09/2017)    . Multiple Vitamin (MULTIVITAMIN WITH MINERALS) TABS tablet Take 1 tablet by mouth at bedtime.    . ondansetron (ZOFRAN) 8 MG tablet Take 1 tablet  (8 mg total) by mouth 2 (two) times daily as needed (Nausea or vomiting). 30 tablet 1  . prochlorperazine (COMPAZINE) 10 MG tablet Take 1 tablet (10 mg total) by mouth every 6 (six) hours as needed (Nausea or vomiting). 30 tablet 1  . rivaroxaban (XARELTO) 20 MG TABS tablet TAKE 1 TABLET BY MOUTH  DAILY WITH SUPPER 30 tablet 11  . simvastatin (ZOCOR) 20 MG tablet Take 20 mg by mouth at bedtime.     No current facility-administered medications for this visit.     PHYSICAL EXAMINATION: ECOG PERFORMANCE STATUS: 2 - Symptomatic, <50% confined to bed  Vitals:   02/04/17 1459  Pulse: 95  Resp: 16  Temp: 98 F (36.7 C)  SpO2: 98%   Filed Weights   02/04/17 1459  Weight: 296 lb 8 oz (134.5 kg)    GENERAL:alert, no distress and comfortable SKIN: skin color, texture, turgor are normal, no rashes or significant lesions EYES: normal, Conjunctiva are pink and  non-injected, sclera clear OROPHARYNX:no exudate, no erythema and lips, buccal mucosa, and tongue normal  NECK: supple, thyroid normal size, non-tender, without nodularity LYMPH:  no palpable lymphadenopathy in the cervical, axillary or inguinal LUNGS: clear to auscultation and percussion with normal breathing effort HEART: regular rate & rhythm and no murmurs and no lower extremity edema ABDOMEN:abdomen soft, non-tender and normal bowel sounds MUSCULOSKELETAL:no cyanosis of digits and no clubbing  NEURO: alert & oriented x 3 with fluent speech, no focal motor/sensory deficits EXTREMITIES: 3+ lower extremity edema  LABORATORY DATA:  I have reviewed the data as listed   Chemistry      Component Value Date/Time   NA 139 01/09/2017 1028   K 4.1 01/09/2017 1028   CL 107 10/24/2015 0431   CO2 18 (L) 01/09/2017 1028   BUN 38.7 (H) 01/09/2017 1028   CREATININE 1.3 (H) 01/09/2017 1028      Component Value Date/Time   CALCIUM 8.9 01/09/2017 1028   ALKPHOS 626 (H) 01/09/2017 1028   AST 90 (H) 01/09/2017 1028   ALT 67 (H) 01/09/2017  1028   BILITOT 1.74 (H) 01/09/2017 1028       Lab Results  Component Value Date   WBC 2.9 (L) 01/09/2017   HGB 9.2 (L) 01/09/2017   HCT 29.1 (L) 01/09/2017   MCV 87.9 01/09/2017   PLT 200 01/09/2017   NEUTROABS 1.8 01/09/2017    ASSESSMENT & PLAN:  Breast cancer of upper-outer quadrant of right female breast ([place]) 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 Followed by 12 cycles of weekly Abraxane completed 07/19/2014 S/P Mastectomy 09/22/14: Bilateral mastectomies: Right: IDC Grade 2, 2.7cm, LVI, PNI, 6/8 LN Positive, Er 90%, PR 5% T2N2 (Stage IIIA) Nonhealing right breast wound: finally healed Adjuvant radiation therapy started 03/22/2015 to complete 05/11/2015  Current treatment: Exemestane with Everolimus  PET/CT scan 08/08/2015: Multifocal areas of increased hypermetabolic is in new/progressive but without definite underlying lesion on CT scan suspicious for metastases, T5, T7, left sacrum, left iliac bone; 13 mm gastrohepatic node SUV 5.2 Suspicious for nodal metastases. ----------------------------------------------------------------------------------------------------------------------------------------------------------------- Treatment plan:Ibrance with Faslodex. Started 08/12/2015 ; discontinued 10/09/2016 due to progression Bone metastases XGeva once every 3 months (started 01/12/2016)  Pulmonary emboli: Currently on xarelto  10/02/2016 CT CAP: New hepatic metastases with the right and left hepatic lobes. New lung metastases. Stable sclerotic metastases within the pelvis and spine   Liver biopsy 10/15/2016: Metastatic carcinoma consistent with breast primary ER 100% PR 0%, HER-2 negative ratio 1.33  Treatment plan: 1. BRCA mutation analysis: Neg 2. 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 Exemestane  with Everolimus started 11/15/2016  02/01/2017 CT CAP: Worsening progressive tumor throughout the liver with new and enlarging masses, stable bone metastases; 0.9 x 1.1 cm right apical lung nodule  Because of worsening disease, I recommended systemic chemotherapy with  Gemcitabine. We will treat her days 1 and 8 every 3 weeks. Starting next Monday   I spent 25 minutes talking to the patient of which more than half was spent in counseling and coordination of care.  Orders Placed This Encounter  Procedures  . CBC with Differential    Standing Status:   Standing    Number of Occurrences:   20    Standing Expiration Date:   02/05/2018  . Comprehensive metabolic panel    Standing Status:   Standing  Number of Occurrences:   20    Standing Expiration Date:   02/05/2018   The patient has a good understanding of the overall plan. she agrees with it. she will call with any problems that may develop before the next visit here.   Rulon Eisenmenger, MD 02/04/17

## 2017-02-04 NOTE — Progress Notes (Signed)
START OFF PATHWAY REGIMEN - Breast   [id number]:Gemcitabine 1,000 mg/m2 D1, 8  q21 Days:   A cycle is every 21 days:     Gemcitabine   **Always confirm dose/schedule in your pharmacy ordering system**    Patient Characteristics: Metastatic Chemotherapy, HER2 Negative/Unknown/Equivocal, ER Positive, Second Line, Prior Paclitaxel Therapeutic Status: Distant Metastases BRCA Mutation Status: Absent ER Status: Positive (+) HER2 Status: Negative (-) Would you be surprised if this patient died  in the next year<= I would be surprised if this patient died in the next year PR Status: Positive (+) Line of therapy: Second Line Intent of Therapy: Non-Curative / Palliative Intent, Discussed with Patient

## 2017-02-06 ENCOUNTER — Emergency Department (HOSPITAL_COMMUNITY)
Admission: EM | Admit: 2017-02-06 | Discharge: 2017-02-06 | Disposition: A | Discharge status: Home / Self Care | Payer: Medicare Other | Attending: Emergency Medicine | Admitting: Emergency Medicine

## 2017-02-06 ENCOUNTER — Other Ambulatory Visit: Payer: Self-pay

## 2017-02-06 ENCOUNTER — Emergency Department (HOSPITAL_COMMUNITY): Payer: Medicare Other

## 2017-02-06 DIAGNOSIS — E860 Dehydration: Secondary | ICD-10-CM | POA: Diagnosis not present

## 2017-02-06 DIAGNOSIS — Z87891 Personal history of nicotine dependence: Secondary | ICD-10-CM | POA: Diagnosis not present

## 2017-02-06 DIAGNOSIS — R0602 Shortness of breath: Secondary | ICD-10-CM | POA: Diagnosis not present

## 2017-02-06 DIAGNOSIS — R531 Weakness: Secondary | ICD-10-CM | POA: Diagnosis not present

## 2017-02-06 DIAGNOSIS — D0591 Unspecified type of carcinoma in situ of right breast: Secondary | ICD-10-CM | POA: Diagnosis not present

## 2017-02-06 DIAGNOSIS — Z7984 Long term (current) use of oral hypoglycemic drugs: Secondary | ICD-10-CM | POA: Insufficient documentation

## 2017-02-06 DIAGNOSIS — I10 Essential (primary) hypertension: Secondary | ICD-10-CM | POA: Diagnosis not present

## 2017-02-06 DIAGNOSIS — C7951 Secondary malignant neoplasm of bone: Secondary | ICD-10-CM | POA: Diagnosis not present

## 2017-02-06 DIAGNOSIS — E119 Type 2 diabetes mellitus without complications: Secondary | ICD-10-CM | POA: Diagnosis not present

## 2017-02-06 DIAGNOSIS — R42 Dizziness and giddiness: Secondary | ICD-10-CM | POA: Diagnosis not present

## 2017-02-06 DIAGNOSIS — Z79899 Other long term (current) drug therapy: Secondary | ICD-10-CM | POA: Insufficient documentation

## 2017-02-06 LAB — BASIC METABOLIC PANEL
ANION GAP: 8 (ref 5–15)
BUN: 32 mg/dL — ABNORMAL HIGH (ref 6–20)
CHLORIDE: 107 mmol/L (ref 101–111)
CO2: 22 mmol/L (ref 22–32)
Calcium: 8 mg/dL — ABNORMAL LOW (ref 8.9–10.3)
Creatinine, Ser: 1.39 mg/dL — ABNORMAL HIGH (ref 0.44–1.00)
GFR calc non Af Amer: 38 mL/min — ABNORMAL LOW (ref >60)
GFR, EST AFRICAN AMERICAN: 44 mL/min — AB (ref >60)
Glucose, Bld: 239 mg/dL — ABNORMAL HIGH (ref 65–99)
POTASSIUM: 3.4 mmol/L — AB (ref 3.5–5.1)
SODIUM: 137 mmol/L (ref 135–145)

## 2017-02-06 LAB — BRAIN NATRIURETIC PEPTIDE: B NATRIURETIC PEPTIDE 5: 174.9 pg/mL — AB (ref 0.0–100.0)

## 2017-02-06 LAB — TROPONIN I: Troponin I: 0.05 ng/mL (ref <0.03)

## 2017-02-06 LAB — I-STAT TROPONIN, ED: TROPONIN I, POC: 0.02 ng/mL (ref 0.00–0.08)

## 2017-02-06 MED ORDER — IOPAMIDOL (ISOVUE-370) INJECTION 76%
INTRAVENOUS | Status: AC
  Filled 2017-02-06: qty 100

## 2017-02-06 MED ORDER — HEPARIN SOD (PORK) LOCK FLUSH 100 UNIT/ML IV SOLN
500.0000 [IU] | Freq: Once | INTRAVENOUS | Status: AC
  Administered 2017-02-06: 500 [IU]
  Filled 2017-02-06: qty 5

## 2017-02-06 MED ORDER — SODIUM CHLORIDE 0.9 % IV BOLUS (SEPSIS)
1000.0000 mL | Freq: Once | INTRAVENOUS | Status: AC
  Administered 2017-02-06: 1000 mL via INTRAVENOUS

## 2017-02-06 NOTE — ED Triage Notes (Signed)
Per EMS pt is coming from with complaints of SOB. EMS arriving and finding pt sitting on couch in obvious respiratory distress. Pt has a hematoma to the right side of face after experiencing a fall. Pt reports feeling weak and dizzy when standing. Pt reports that feeling short of breath is normal for her but today has been worsened. Pt has a hx of cancer in breast, liver and spine. Pt is also diabetic.

## 2017-02-06 NOTE — ED Provider Notes (Signed)
[facility] DEPT Provider Note   CSN: [id number] Arrival date & time: 02/06/17  1246     History   Chief Complaint Chief Complaint  Patient presents with  . Dehydration; SOB    HPI Tonya Alvarez is a 71 y.o. female.  HPI   Patient presents with concern of dyspnea, fatigue, weakness. Patient has metastatic breast cancer, is transitioning from oral chemotherapy 2 IV therapy. She is scheduled for first session next week. She notes over the past few weeks in particular she has had worsening dyspnea at rest and with exertion. She also complains of generalized weakness, without focal weakness. No confusion, no disorientation. Patient saw her physician 2 days ago, was found to have hypotension. This improved with fluids, and she was discharged home. However, since discharge she has been worsening. Patient denies history of pulmonary embolism, notes that recent CT imaging demonstrated progression of metastatic disease in multiple organs, bone.  Past Medical History:  Diagnosis Date  . Arthritis    "knees, ankles" (09/22/2014  . Breast cancer ([place]) 02/04/14   right breast  . Cancer of right breast ([place])   . Chronic lower back pain   . Complication of anesthesia    O2 SAT  DROP   . DDD (degenerative disc disease), cervical   . DDD (degenerative disc disease), lumbosacral   . DDD (degenerative disc disease), thoracolumbar   . Diabetes mellitus without complication ([place])   . Diabetic peripheral neuropathy ([place])   . Heart murmur    mitral valve  . Hyperlipidemia   . Hypertension   . Mitral regurgitation   . Neuromuscular disorder ([place])   . Sinus headache    "seasonal"  . Type II diabetes mellitus [facility])     Patient Active Problem List   Diagnosis Date Noted  . Goals of care, counseling/discussion 02/04/2017  . Genetic testing 11/15/2016  . Pulmonary embolism ([place]) 10/22/2015  . PE (pulmonary embolism) 10/22/2015  . Diabetes mellitus without complication ([place])   .  Port catheter in place 09/09/2015  . Bone metastases ([place]) 04/12/2015  . Hyperkalemia 04/02/2014  . Hypertension 04/02/2014  . Breast cancer of upper-outer quadrant of right female breast ([place]) 02/09/2014    Past Surgical History:  Procedure Laterality Date  . BREAST BIOPSY Right 02/2014  . JOINT REPLACEMENT    . MASTECTOMY COMPLETE / SIMPLE Left 09/22/2014  . MASTECTOMY MODIFIED RADICAL Right 09/22/2014  . MASTECTOMY MODIFIED RADICAL Right 09/22/2014   Procedure:  RIGHT MASTECTOMY MODIFIED RADICAL;  Surgeon: Coralie Keens, MD;  Location: [facility];  Service: General;  Laterality: Right;  . PORTACATH PLACEMENT Right 02/23/2014   power port, tip cavo-atrial junction  . SIMPLE MASTECTOMY WITH AXILLARY SENTINEL NODE BIOPSY Left 09/22/2014   Procedure: LEFT SIMPLE MASTECTOMY;  Surgeon: Coralie Keens, MD;  Location: [facility];  Service: General;  Laterality: Left;  . TONSILLECTOMY  1955  . TOTAL KNEE ARTHROPLASTY Left ~ 2009  . TUBAL LIGATION  1980    OB History    No data available       Home Medications    Prior to Admission medications   Medication Sig Start Date End Date Taking? Authorizing Provider  calcium-vitamin D (OSCAL WITH D) 500-200 MG-UNIT per tablet Take 1 tablet by mouth daily.    [provider]  clindamycin (CLEOCIN) 300 MG capsule Take 900 mg by mouth See admin instructions. Take 3 capsules (900 mg) by mouth one hour prior to dental appointment - last appointment October 11, 2015  [provider]  doxycycline (VIBRA-TABS) 100 MG tablet Take 1 tablet (100 mg total) by mouth 2 (two) times daily. 01/09/17   Gardenia Phlegm, NP  fluticasone (FLONASE) 50 MCG/ACT nasal spray Place 1 spray into both nostrils daily as needed for allergies or rhinitis. Patient taking differently: Place 1 spray into both nostrils at bedtime as needed for allergies or rhinitis.  07/26/14   Nicholas Lose, MD  furosemide (LASIX) 40 MG tablet TAKE 1 TABLET BY MOUTH  DAILY 01/22/17    Nicholas Lose, MD  gabapentin (NEURONTIN) 300 MG capsule Take 300 mg by mouth 3 (three) times daily.     [provider]  ibuprofen (ADVIL,MOTRIN) 600 MG tablet Take 600 mg by mouth every 12 (twelve) hours.    [provider]  insulin lispro protamine-lispro (HUMALOG 75/25 MIX) (75-25) 100 UNIT/ML SUSP injection Inject 10-50 Units into the skin See admin instructions. Inject 50 units subcutaneously with breakfast and supper, inject 10 mls after lunch if CBG >150    [provider]  lidocaine-prilocaine (EMLA) cream Apply 1 application topically as needed. Patient not taking: Reported on 01/09/2017 04/09/14   Laurie Panda, NP  lidocaine-prilocaine (EMLA) cream Apply to affected area once 02/04/17   Nicholas Lose, MD  Liniments [facility] PAIN RELIEF PATCH EX) Place 1 patch onto the skin daily as needed (PAIN).    [provider]  losartan (COZAAR) 50 MG tablet Take 50 mg by mouth daily.     [provider]  metFORMIN (GLUCOPHAGE-XR) 500 MG 24 hr tablet Take 2 tablets (1,000 mg total) by mouth 2 (two) times daily. Patient not taking: Reported on 01/09/2017 10/31/15   Hosie Poisson, MD  Multiple Vitamin (MULTIVITAMIN WITH MINERALS) TABS tablet Take 1 tablet by mouth at bedtime.    [provider]  ondansetron (ZOFRAN) 8 MG tablet Take 1 tablet (8 mg total) by mouth 2 (two) times daily as needed (Nausea or vomiting). 02/04/17   Nicholas Lose, MD  prochlorperazine (COMPAZINE) 10 MG tablet Take 1 tablet (10 mg total) by mouth every 6 (six) hours as needed (Nausea or vomiting). 02/04/17   Nicholas Lose, MD  rivaroxaban (XARELTO) 20 MG TABS tablet TAKE 1 TABLET BY MOUTH  DAILY WITH SUPPER 10/30/16   Nicholas Lose, MD  simvastatin (ZOCOR) 20 MG tablet Take 20 mg by mouth at bedtime. 09/28/15   [provider]    Family History Family History  Problem Relation Age of Onset  . Cancer Father        lung cancer  . Cancer Maternal Aunt         Kidney  . Hypertension Maternal Uncle     Social History Social History  Substance Use Topics  . Smoking status: Former Smoker    Packs/day: 0.75    Years: 30.00    Types: Cigarettes    Quit date: 05/07/2013  . Smokeless tobacco: Never Used  . Alcohol use No     Comment: 09/22/2014 "I'll drink a few times/yr"     Allergies   Amoxicillin; Hydrocodone; and Lisinopril   Review of Systems Review of Systems  Constitutional:       Per HPI, otherwise negative  HENT:       Per HPI, otherwise negative  Respiratory:       Per HPI, otherwise negative  Cardiovascular:       Per HPI, otherwise negative  Gastrointestinal: Negative for vomiting.  Endocrine:       Negative aside from HPI  Genitourinary:       Neg aside from HPI   Musculoskeletal:       Per HPI, otherwise negative  Skin: Negative.   Allergic/Immunologic: Positive for immunocompromised state.  Neurological: Positive for weakness. Negative for syncope.     Physical Exam Updated Vital Signs BP (!) 98/51 (BP Location: Right Wrist)   Pulse 74   Temp 97.7 F (36.5 C) (Oral)   Resp 20   SpO2 100%   Physical Exam  Constitutional: She is oriented to person, place, and time. She appears well-developed and well-nourished. No distress.  HENT:  Head: Normocephalic and atraumatic.  Eyes: Conjunctivae and EOM are normal.  Cardiovascular: Normal rate and regular rhythm.   Pulmonary/Chest: No stridor. She has decreased breath sounds.  With supplemental oxygen 2 L via nasal cannula also oximetry is 100% since abnormal   Abdominal: She exhibits no distension. There is no tenderness.  Musculoskeletal: She exhibits no edema.  Neurological: She is alert and oriented to person, place, and time. No cranial nerve deficit.  Skin: Skin is warm and dry.  Psychiatric: She has a normal mood and affect.  Nursing note and vitals reviewed.    ED Treatments / Results  Labs (all labs ordered are listed, but only abnormal results  are displayed) Labs Reviewed  BASIC METABOLIC PANEL - Abnormal; Notable for the following:       Result Value   Potassium 3.4 (*)    Glucose, Bld 239 (*)    BUN 32 (*)    Creatinine, Ser 1.39 (*)    Calcium 8.0 (*)    GFR calc non Af Amer 38 (*)    GFR calc Af Amer 44 (*)    All other components within normal limits  BRAIN NATRIURETIC PEPTIDE - Abnormal; Notable for the following:    B Natriuretic Peptide 174.9 (*)    All other components within normal limits  TROPONIN I - Abnormal; Notable for the following:    Troponin I 0.05 (*)    All other components within normal limits  I-STAT TROPONIN, ED    Radiology Dg Chest 2 View  Result Date: 02/06/2017 CLINICAL DATA:  Shortness of breath, respiratory distress, patient experienced a fall and feels weak in dizzy when standing. History of breast malignancy with involvement of the liver and spine. History of diabetes. EXAM: CHEST  2 VIEW COMPARISON:  Chest x-ray of October 22, 2015 FINDINGS: The lungs are well-expanded. There is no focal infiltrate. There is no pleural effusion. The heart is top-normal in size. The pulmonary vascularity is mildly prominent centrally. The power port catheter tip projects over the midportion of the SVC. There is multilevel degenerative disc disease of the thoracic spine. IMPRESSION: There is no acute pneumonia. There is mild central pulmonary vascular prominence without definite cardiomegaly. No pleural effusion. Electronically Signed   By: David  Martinique M.D.   On: 02/06/2017 13:33    Procedures Procedures (including critical care time)  Medications Ordered in ED   Initial Impression / Assessment and Plan / ED Course  I have reviewed the triage vital signs and the nursing notes.  Pertinent labs & imaging results that were available during my care of the patient were reviewed by me and considered in my medical decision making (see chart for details).  After the initial evaluation with concern for progression  of disease versus pulmonary embolism versus pneumonia, patient had labs, x-ray. Chart review performed, notable for recent CT scan with progression of disease now involving multiple  organs, lung, liver, bone.  3:26 PM On repeat exam the patient is on room air, oxygen saturation 98%. Labs reviewed with her, notable for some dehydration, lower troponin I normal, unremarkable BMP for her. We discussed the x-ray that does not demonstrate pneumonia, pneumothorax, nor substantial mass. She confirms that she has had duplex ultrasound of her lower extremities within the past 2 weeks, but did not demonstrate new DVT. Patient is on Xarelto for prior PE, and with that reassuring ultrasound study, current absence of hypoxia, though she does have risk factor for PE, she is appropriately treated for this entity, and we decided to forego CT imaging.  With evidence for dehydration, patient will receive IV fluids. We discussed the importance of following up with her oncology team.   Final Clinical Impressions(s) / ED Diagnoses  Weakness Dehydration Dyspnea   Carmin Muskrat, MD 02/06/17 1527

## 2017-02-06 NOTE — Discharge Instructions (Signed)
As discussed, it is important that you follow up with your physician for continued management of your condition. ° °If you develop any new, or concerning changes in your condition, please return to the emergency department immediately. °

## 2017-02-08 ENCOUNTER — Other Ambulatory Visit: Payer: Self-pay | Admitting: Hematology and Oncology

## 2017-02-11 ENCOUNTER — Telehealth: Payer: Self-pay | Admitting: Hematology and Oncology

## 2017-02-11 ENCOUNTER — Ambulatory Visit (HOSPITAL_BASED_OUTPATIENT_CLINIC_OR_DEPARTMENT_OTHER): Payer: Medicare Other | Admitting: Hematology and Oncology

## 2017-02-11 ENCOUNTER — Other Ambulatory Visit: Payer: Self-pay

## 2017-02-11 ENCOUNTER — Ambulatory Visit: Payer: Medicare Other

## 2017-02-11 ENCOUNTER — Ambulatory Visit (HOSPITAL_BASED_OUTPATIENT_CLINIC_OR_DEPARTMENT_OTHER): Payer: Medicare Other

## 2017-02-11 ENCOUNTER — Ambulatory Visit (HOSPITAL_COMMUNITY)
Admission: RE | Admit: 2017-02-11 | Discharge: 2017-02-11 | Disposition: A | Discharge status: Home / Self Care | Payer: Medicare Other | Source: Ambulatory Visit | Attending: Hematology and Oncology | Admitting: Hematology and Oncology

## 2017-02-11 DIAGNOSIS — D649 Anemia, unspecified: Secondary | ICD-10-CM

## 2017-02-11 DIAGNOSIS — I2699 Other pulmonary embolism without acute cor pulmonale: Secondary | ICD-10-CM

## 2017-02-11 DIAGNOSIS — C50411 Malignant neoplasm of upper-outer quadrant of right female breast: Secondary | ICD-10-CM | POA: Diagnosis not present

## 2017-02-11 DIAGNOSIS — C7951 Secondary malignant neoplasm of bone: Secondary | ICD-10-CM | POA: Diagnosis not present

## 2017-02-11 DIAGNOSIS — C787 Secondary malignant neoplasm of liver and intrahepatic bile duct: Secondary | ICD-10-CM | POA: Diagnosis not present

## 2017-02-11 DIAGNOSIS — Z7901 Long term (current) use of anticoagulants: Secondary | ICD-10-CM | POA: Diagnosis not present

## 2017-02-11 DIAGNOSIS — Z170 Estrogen receptor positive status [ER+]: Secondary | ICD-10-CM | POA: Diagnosis not present

## 2017-02-11 LAB — CBC WITH DIFFERENTIAL/PLATELET
BASO%: 0.6 % (ref 0.0–2.0)
Basophils Absolute: 0 10*3/uL (ref 0.0–0.1)
EOS ABS: 0.1 10*3/uL (ref 0.0–0.5)
EOS%: 0.8 % (ref 0.0–7.0)
HEMATOCRIT: 19 % — AB (ref 34.8–46.6)
HGB: 5.6 g/dL — CL (ref 11.6–15.9)
LYMPH%: 22.4 % (ref 14.0–49.7)
MCH: 25.6 pg (ref 25.1–34.0)
MCHC: 29.5 g/dL — AB (ref 31.5–36.0)
MCV: 86.8 fL (ref 79.5–101.0)
MONO#: 0.7 10*3/uL (ref 0.1–0.9)
MONO%: 10.5 % (ref 0.0–14.0)
NEUT%: 65.7 % (ref 38.4–76.8)
NEUTROS ABS: 4.1 10*3/uL (ref 1.5–6.5)
PLATELETS: 249 10*3/uL (ref 145–400)
RBC: 2.19 10*6/uL — AB (ref 3.70–5.45)
RDW: 18.8 % — ABNORMAL HIGH (ref 11.2–14.5)
WBC: 6.2 10*3/uL (ref 3.9–10.3)
lymph#: 1.4 10*3/uL (ref 0.9–3.3)
nRBC: 1 % — ABNORMAL HIGH (ref 0–0)

## 2017-02-11 LAB — COMPREHENSIVE METABOLIC PANEL
ALK PHOS: 647 U/L — AB (ref 40–150)
ALT: 62 U/L — AB (ref 0–55)
ANION GAP: 9 meq/L (ref 3–11)
AST: 137 U/L — ABNORMAL HIGH (ref 5–34)
Albumin: 2 g/dL — ABNORMAL LOW (ref 3.5–5.0)
BILIRUBIN TOTAL: 2.36 mg/dL — AB (ref 0.20–1.20)
BUN: 24.7 mg/dL (ref 7.0–26.0)
CALCIUM: 8.5 mg/dL (ref 8.4–10.4)
CO2: 22 mEq/L (ref 22–29)
CREATININE: 1.1 mg/dL (ref 0.6–1.1)
Chloride: 109 mEq/L (ref 98–109)
EGFR: 52 mL/min/{1.73_m2} — AB (ref >90)
Glucose: 139 mg/dl (ref 70–140)
POTASSIUM: 3.4 meq/L — AB (ref 3.5–5.1)
Sodium: 140 mEq/L (ref 136–145)
Total Protein: 5.6 g/dL — ABNORMAL LOW (ref 6.4–8.3)

## 2017-02-11 LAB — PREPARE RBC (CROSSMATCH)

## 2017-02-11 LAB — ABO/RH: ABO/RH(D): A POS

## 2017-02-11 MED ORDER — DIPHENHYDRAMINE HCL 25 MG PO CAPS
25.0000 mg | ORAL_CAPSULE | Freq: Once | ORAL | Status: AC
  Administered 2017-02-11: 25 mg via ORAL

## 2017-02-11 MED ORDER — SODIUM CHLORIDE 0.9% FLUSH
10.0000 mL | INTRAVENOUS | Status: AC | PRN
  Administered 2017-02-11: 10 mL
  Filled 2017-02-11: qty 10

## 2017-02-11 MED ORDER — ACETAMINOPHEN 325 MG PO TABS
650.0000 mg | ORAL_TABLET | Freq: Once | ORAL | Status: AC
  Administered 2017-02-11: 650 mg via ORAL

## 2017-02-11 MED ORDER — DIPHENHYDRAMINE HCL 25 MG PO CAPS
ORAL_CAPSULE | ORAL | Status: AC
  Filled 2017-02-11: qty 1

## 2017-02-11 MED ORDER — ACETAMINOPHEN 325 MG PO TABS
ORAL_TABLET | ORAL | Status: AC
Start: 2017-02-11 — End: 2017-02-11
  Filled 2017-02-11: qty 2

## 2017-02-11 MED ORDER — SODIUM CHLORIDE 0.9 % IV SOLN
250.0000 mL | Freq: Once | INTRAVENOUS | Status: AC
  Administered 2017-02-11: 250 mL via INTRAVENOUS

## 2017-02-11 MED ORDER — HEPARIN SOD (PORK) LOCK FLUSH 100 UNIT/ML IV SOLN
500.0000 [IU] | Freq: Every day | INTRAVENOUS | Status: AC | PRN
  Administered 2017-02-11: 500 [IU]
  Filled 2017-02-11: qty 5

## 2017-02-11 NOTE — Telephone Encounter (Signed)
Added patient for one more unit of PRBC's tomorrow - patient was only able to get one unit today. Spoke with infusion re date/time for tomorrow - patient still in infusion.   Message to Nurse director re rescheduling chemo for end of this week.

## 2017-02-11 NOTE — Patient Instructions (Signed)
Blood Transfusion, Adult, Care After This sheet gives you information about how to care for yourself after your procedure. Your health care provider may also give you more specific instructions. If you have problems or questions, contact your health care provider. What can I expect after the procedure? After your procedure, it is common to have:  Bruising and soreness where the IV tube was inserted.  Headache.  Follow these instructions at home:  Take over-the-counter and prescription medicines only as told by your health care provider.  Return to your normal activities as told by your health care provider.  Follow instructions from your health care provider about how to take care of your IV insertion site. Make sure you: ? Wash your hands with soap and water before you change your bandage (dressing). If soap and water are not available, use hand sanitizer. ? Change your dressing as told by your health care provider.  Check your IV insertion site every day for signs of infection. Check for: ? More redness, swelling, or pain. ? More fluid or blood. ? Warmth. ? Pus or a bad smell. Contact a health care provider if:  You have more redness, swelling, or pain around the IV insertion site.  You have more fluid or blood coming from the IV insertion site.  Your IV insertion site feels warm to the touch.  You have pus or a bad smell coming from the IV insertion site.  Your urine turns pink, red, or brown.  You feel weak after doing your normal activities. Get help right away if:  You have signs of a serious allergic or immune system reaction, including: ? Itchiness. ? Hives. ? Trouble breathing. ? Anxiety. ? Chest or lower back pain. ? Fever, flushing, and chills. ? Rapid pulse. ? Rash. ? Diarrhea. ? Vomiting. ? Dark urine. ? Serious headache. ? Dizziness. ? Stiff neck. ? Yellow coloration of the face or the white parts of the eyes (jaundice). This information is not  intended to replace advice given to you by your health care provider. Make sure you discuss any questions you have with your health care provider. Document Released: 05/14/2014 Document Revised: 12/21/2015 Document Reviewed: 11/07/2015 Elsevier Interactive Patient Education  2018 Elsevier Inc.  

## 2017-02-11 NOTE — Progress Notes (Signed)
Pt hg 5.4 today. Repeated with hg 5.6 (final). Dr.Gudena notified and orders for 2 units of PRBC today. Reschedule first time chemo this week or next week. Will send message to scheduling for lab/inf. Pt needs t/s samples drawn. Notified lab to send tubes to be collected in infusion. Spoke with Lauren,RN and is aware of plan.

## 2017-02-12 ENCOUNTER — Telehealth: Payer: Self-pay | Admitting: Hematology and Oncology

## 2017-02-12 ENCOUNTER — Ambulatory Visit (HOSPITAL_BASED_OUTPATIENT_CLINIC_OR_DEPARTMENT_OTHER): Payer: Medicare Other

## 2017-02-12 ENCOUNTER — Other Ambulatory Visit: Payer: Self-pay

## 2017-02-12 VITALS — BP 125/66 | HR 85 | Temp 98.6°F | Resp 18

## 2017-02-12 DIAGNOSIS — Z23 Encounter for immunization: Secondary | ICD-10-CM | POA: Diagnosis not present

## 2017-02-12 DIAGNOSIS — Z170 Estrogen receptor positive status [ER+]: Secondary | ICD-10-CM | POA: Diagnosis not present

## 2017-02-12 DIAGNOSIS — C50411 Malignant neoplasm of upper-outer quadrant of right female breast: Secondary | ICD-10-CM

## 2017-02-12 DIAGNOSIS — D649 Anemia, unspecified: Secondary | ICD-10-CM | POA: Diagnosis not present

## 2017-02-12 DIAGNOSIS — Z5111 Encounter for antineoplastic chemotherapy: Secondary | ICD-10-CM | POA: Diagnosis not present

## 2017-02-12 DIAGNOSIS — C7951 Secondary malignant neoplasm of bone: Secondary | ICD-10-CM

## 2017-02-12 DIAGNOSIS — Z95828 Presence of other vascular implants and grafts: Secondary | ICD-10-CM

## 2017-02-12 LAB — CBC WITH DIFFERENTIAL/PLATELET
BASO%: 0.4 % (ref 0.0–2.0)
BASOS ABS: 0 10*3/uL (ref 0.0–0.1)
EOS%: 0.9 % (ref 0.0–7.0)
Eosinophils Absolute: 0.1 10*3/uL (ref 0.0–0.5)
HEMATOCRIT: 23.1 % — AB (ref 34.8–46.6)
HEMOGLOBIN: 7.1 g/dL — AB (ref 11.6–15.9)
LYMPH#: 1.4 10*3/uL (ref 0.9–3.3)
LYMPH%: 26.4 % (ref 14.0–49.7)
MCH: 26.1 pg (ref 25.1–34.0)
MCHC: 30.7 g/dL — ABNORMAL LOW (ref 31.5–36.0)
MCV: 84.9 fL (ref 79.5–101.0)
MONO#: 0.5 10*3/uL (ref 0.1–0.9)
MONO%: 8.6 % (ref 0.0–14.0)
NEUT#: 3.4 10*3/uL (ref 1.5–6.5)
NEUT%: 63.7 % (ref 38.4–76.8)
PLATELETS: 228 10*3/uL (ref 145–400)
RBC: 2.72 10*6/uL — ABNORMAL LOW (ref 3.70–5.45)
RDW: 18.7 % — AB (ref 11.2–14.5)
WBC: 5.4 10*3/uL (ref 3.9–10.3)

## 2017-02-12 MED ORDER — HEPARIN SOD (PORK) LOCK FLUSH 100 UNIT/ML IV SOLN
500.0000 [IU] | Freq: Once | INTRAVENOUS | Status: AC | PRN
Start: 2017-02-12 — End: 2017-02-12
  Administered 2017-02-12: 500 [IU]
  Filled 2017-02-12: qty 5

## 2017-02-12 MED ORDER — ALTEPLASE 2 MG IJ SOLR
2.0000 mg | Freq: Once | INTRAMUSCULAR | Status: DC | PRN
  Filled 2017-02-12: qty 2

## 2017-02-12 MED ORDER — ACETAMINOPHEN 325 MG PO TABS
650.0000 mg | ORAL_TABLET | Freq: Once | ORAL | Status: AC
  Administered 2017-02-12: 650 mg via ORAL

## 2017-02-12 MED ORDER — DIPHENHYDRAMINE HCL 25 MG PO CAPS
ORAL_CAPSULE | ORAL | Status: AC
  Filled 2017-02-12: qty 1

## 2017-02-12 MED ORDER — INFLUENZA VAC SPLIT QUAD 0.5 ML IM SUSY
0.5000 mL | PREFILLED_SYRINGE | Freq: Once | INTRAMUSCULAR | Status: AC
  Administered 2017-02-12: 0.5 mL via INTRAMUSCULAR
  Filled 2017-02-12: qty 0.5

## 2017-02-12 MED ORDER — SODIUM CHLORIDE 0.9 % IV SOLN
250.0000 mL | Freq: Once | INTRAVENOUS | Status: AC
  Administered 2017-02-12: 250 mL via INTRAVENOUS

## 2017-02-12 MED ORDER — DIPHENHYDRAMINE HCL 25 MG PO CAPS
25.0000 mg | ORAL_CAPSULE | Freq: Once | ORAL | Status: AC
  Administered 2017-02-12: 25 mg via ORAL

## 2017-02-12 MED ORDER — ACETAMINOPHEN 325 MG PO TABS
ORAL_TABLET | ORAL | Status: AC
  Filled 2017-02-12: qty 2

## 2017-02-12 MED ORDER — SODIUM CHLORIDE 0.9 % IV SOLN
800.0000 mg/m2 | Freq: Once | INTRAVENOUS | Status: AC
  Administered 2017-02-12: 2052 mg via INTRAVENOUS
  Filled 2017-02-12: qty 54

## 2017-02-12 MED ORDER — PROCHLORPERAZINE MALEATE 10 MG PO TABS
ORAL_TABLET | ORAL | Status: AC
  Filled 2017-02-12: qty 1

## 2017-02-12 MED ORDER — SODIUM CHLORIDE 0.9% FLUSH
10.0000 mL | INTRAVENOUS | Status: DC | PRN
  Administered 2017-02-12: 10 mL
  Filled 2017-02-12: qty 10

## 2017-02-12 MED ORDER — PROCHLORPERAZINE MALEATE 10 MG PO TABS
10.0000 mg | ORAL_TABLET | Freq: Once | ORAL | Status: AC
  Administered 2017-02-12: 10 mg via ORAL

## 2017-02-12 MED ORDER — DENOSUMAB 120 MG/1.7ML ~~LOC~~ SOLN
120.0000 mg | Freq: Once | SUBCUTANEOUS | Status: AC
  Administered 2017-02-12: 120 mg via SUBCUTANEOUS
  Filled 2017-02-12: qty 1.7

## 2017-02-12 NOTE — Progress Notes (Signed)
Labs reviewed.  Ok to treat per Dr. Lindi Adie.  Pt receiving blood transfusion/ok to treat with current chemistries.

## 2017-02-12 NOTE — Progress Notes (Signed)
Patient Care Team: Jacelyn Pi, MD as PCP - General (Endocrinology)  DIAGNOSIS:  Encounter Diagnosis  Name Primary?  . Malignant neoplasm of upper-outer quadrant of right breast in female, estrogen receptor positive ([place])     SUMMARY OF ONCOLOGIC HISTORY:   Breast cancer of upper-outer quadrant of right female breast ([place])   02/02/2014 Mammogram    Right breast: 5.8 cm irregular high-density solid mass suggestive of malignancy; left breast next millimeter internal mammary lymph node      02/04/2014 Initial Biopsy     2 biopsies were performed the right breast and axilla: Grade 2 invasive ductal carcinoma ER/PR positive HER-2 negative Ki-67 20% to 47%: Biopsy of right humerus metastatic breast cancer estrogen positive      02/17/2014 PET scan    Hypermetabolic right breast cancer and right axillary lymph nodes subpectoral lymph nodes indeterminate right middle lobe pulmonary nodule and hypermetabolic proximal right humerus lesion      03/05/2014 - 07/19/2014 Neo-Adjuvant Chemotherapy    Dose dense Adriamycin and Cytoxan x4 cycles followed by Abraxane weekly x12      07/26/2014 Breast MRI    Multifocal breast cancer decreased in size, retroareolar 13 mm now 11 mm, middle third 22 mm now 18 mm, 3.9 x 3.9 cm now 3.9 x 2.6 cm, multiple smaller nodules decreased in size, right x-ray lymph node 3.6 cm now 2 cm other lymph nodes are smaller      07/28/2014 - 08/12/2015 Anti-estrogen oral therapy    Anastrozole 1 mg daily      08/02/2014 PET scan    Interval improvement in the right breast mass and axillary/subpectoral lymph nodes and right humerus head. Several subpleural nodules are not visible, 3.4. cm adrenal adenoma      09/22/2014 Surgery    Bilateral mastectomy: Right: IDC Grade 2, 2.7cm, LVI, PNI, 6/8 LN Positive, Er 90%, PR 5% T2N2      03/22/2015 -  Radiation Therapy    adjuvant radiation therapy with Dr. Isidore Moos      08/10/2015 PET scan    Progression of disease with  several new bone metastases, T5, T7, left sacrum, left iliac bone, gastrohepatic lymph node      08/12/2015 - 10/30/2016 Anti-estrogen oral therapy    Faslodex with Brock Bad every 3 months for bone metastases (stopped for progression of liver metastases)      11/12/2016 -  Anti-estrogen oral therapy    Exemestane Everolimus      02/01/2017 Imaging    CT CAP: Worsening progressive tumor throughout the liver with new and enlarging masses, stable bone metastases; 0.9 x 1.1 cm right apical lung nodule       CHIEF COMPLIANT: Severe shortness of breath even at rest  INTERVAL HISTORY: Tonya Alvarez is a 71 year old with above-mentioned history metastatic breast cancer who is here today to start her first cycle of chemotherapy with carboplatin and gemcitabine but she is complaining of severe shortness of breath to minimal exertion. Upon review of her labs she was found to have a hemoglobin of 5.8 and she is being held from chemotherapy and given blood transfusion instead. Chemotherapy will have to be postponed. She has recently had extensive bruising 1 related to an IV site as well as another when she fell from her bed eating her nightstand on the face causing some bruising of her eye. She also felt overall generally fatigued and weak and is using a wheelchair today.  REVIEW OF SYSTEMS:   Constitutional: Denies fevers,  chills or abnormal weight loss Eyes: Denies blurriness of vision Ears, nose, mouth, throat, and face: Denies mucositis or sore throat Respiratory: Shortness of breath at rest Cardiovascular: Denies palpitation, chest discomfort Gastrointestinal:  Denies nausea, heartburn or change in bowel habits Skin: Denies abnormal skin rashes Lymphatics: Denies new lymphadenopathy or easy bruising Neurological:Denies numbness, tingling or new weaknesses Behavioral/Psych: Mood is stable, no new changes  Extremities: No lower extremity edema  All other systems were reviewed with the  patient and are negative.  I have reviewed the past medical history, past surgical history, social history and family history with the patient and they are unchanged from previous note.  ALLERGIES:  is allergic to amoxicillin; hydrocodone; and lisinopril.  MEDICATIONS:  Current Outpatient Prescriptions  Medication Sig Dispense Refill  . calcium-vitamin D (OSCAL WITH D) 500-200 MG-UNIT per tablet Take 1 tablet by mouth daily.    . clindamycin (CLEOCIN) 300 MG capsule Take 900 mg by mouth See admin instructions. Take 3 capsules (900 mg) by mouth one hour prior to dental appointment - last appointment October 11, 2015    . doxycycline (VIBRA-TABS) 100 MG tablet Take 1 tablet (100 mg total) by mouth 2 (two) times daily. (Patient not taking: Reported on 02/06/2017) 20 tablet 0  . fluticasone (FLONASE) 50 MCG/ACT nasal spray Place 1 spray into both nostrils daily as needed for allergies or rhinitis. (Patient taking differently: Place 1 spray into both nostrils at bedtime as needed for allergies or rhinitis. ) 16 g 0  . furosemide (LASIX) 40 MG tablet TAKE 1 TABLET BY MOUTH  DAILY (Patient taking differently: TAKE 0.5 TABLET BY MOUTH  DAILY) 90 tablet 1  . gabapentin (NEURONTIN) 300 MG capsule Take 300 mg by mouth 3 (three) times daily.     Marland Kitchen ibuprofen (ADVIL,MOTRIN) 600 MG tablet Take 600 mg by mouth every 12 (twelve) hours.    . insulin lispro protamine-lispro (HUMALOG 75/25 MIX) (75-25) 100 UNIT/ML SUSP injection Inject 10-60 Units into the skin See admin instructions. Inject 60 units subcutaneously with breakfast and supper, inject 10 mls after lunch if CBG >150    . lidocaine-prilocaine (EMLA) cream Apply 1 application topically as needed. (Patient not taking: Reported on 01/09/2017) 30 g 6  . lidocaine-prilocaine (EMLA) cream Apply to affected area once (Patient not taking: Reported on 02/06/2017) 30 g 3  . Liniments (SALONPAS PAIN RELIEF PATCH EX) Place 1 patch onto the skin daily as needed (PAIN).    Marland Kitchen  metFORMIN (GLUCOPHAGE-XR) 500 MG 24 hr tablet Take 2 tablets (1,000 mg total) by mouth 2 (two) times daily. (Patient not taking: Reported on 01/09/2017)    . Multiple Vitamin (MULTIVITAMIN WITH MINERALS) TABS tablet Take 1 tablet by mouth at bedtime.    . ondansetron (ZOFRAN) 8 MG tablet Take 1 tablet (8 mg total) by mouth 2 (two) times daily as needed (Nausea or vomiting). (Patient not taking: Reported on 02/06/2017) 30 tablet 1  . prochlorperazine (COMPAZINE) 10 MG tablet Take 1 tablet (10 mg total) by mouth every 6 (six) hours as needed (Nausea or vomiting). 30 tablet 1  . rivaroxaban (XARELTO) 20 MG TABS tablet TAKE 1 TABLET BY MOUTH  DAILY WITH SUPPER 30 tablet 11   No current facility-administered medications for this visit.     PHYSICAL EXAMINATION: ECOG PERFORMANCE STATUS: 2 - Symptomatic, <50% confined to bed  Vitals:   02/11/17 1145  BP: (!) 133/48  Pulse: 99  Resp: (!) 21  Temp: 98.3 F (36.8 C)  SpO2: 100%  Filed Weights   02/11/17 1145  Weight: (!) 307 lb 3.2 oz (139.3 kg)    GENERAL:alert, no distress and comfortable SKIN: Bruising on her arm and face as well as her thighs EYES: normal, Conjunctiva are pink and non-injected, sclera clear OROPHARYNX:no exudate, no erythema and lips, buccal mucosa, and tongue normal  NECK: supple, thyroid normal size, non-tender, without nodularity LYMPH:  no palpable lymphadenopathy in the cervical, axillary or inguinal LUNGS: clear to auscultation and percussion with normal breathing effort HEART: regular rate & rhythm and no murmurs and no lower extremity edema ABDOMEN:abdomen soft, non-tender and normal bowel sounds MUSCULOSKELETAL:no cyanosis of digits and no clubbing  NEURO: alert & oriented x 3 with fluent speech, generalized weakness EXTREMITIES: No lower extremity edema  LABORATORY DATA:  I have reviewed the data as listed   Chemistry      Component Value Date/Time   NA 140 02/11/2017 1208   K 3.4 (L) 02/11/2017 1208    CL 107 02/06/2017 1343   CO2 22 02/11/2017 1208   BUN 24.7 02/11/2017 1208   CREATININE 1.1 02/11/2017 1208      Component Value Date/Time   CALCIUM 8.5 02/11/2017 1208   ALKPHOS 647 (H) 02/11/2017 1208   AST 137 (H) 02/11/2017 1208   ALT 62 (H) 02/11/2017 1208   BILITOT 2.36 (H) 02/11/2017 1208       Lab Results  Component Value Date   WBC 6.2 02/11/2017   HGB 5.6 (LL) 02/11/2017   HCT 19.0 (L) 02/11/2017   MCV 86.8 02/11/2017   PLT 249 02/11/2017   NEUTROABS 4.1 02/11/2017    ASSESSMENT & PLAN:  Breast cancer of upper-outer quadrant of right female breast ([place]) 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 Followed by 12 cycles of weekly Abraxane completed 07/19/2014 S/P Mastectomy 09/22/14: Bilateral mastectomies: Right: IDC Grade 2, 2.7cm, LVI, PNI, 6/8 LN Positive, Er 90%, PR 5% T2N2 (Stage IIIA) Nonhealing right breast wound: finally healed Adjuvant radiation therapy started 03/22/2015 to complete 05/11/2015  Current treatment: Exemestane with Everolimus  PET/CT scan 08/08/2015: Multifocal areas of increased hypermetabolic is in new/progressive but without definite underlying lesion on CT scan suspicious for metastases, T5, T7, left sacrum, left iliac bone; 13 mm gastrohepatic node SUV 5.2 Suspicious for nodal metastases. ----------------------------------------------------------------------------------------------------------------------------------------------------------------- Treatment plan:Ibrance with Faslodex. Started 08/12/2015 ; discontinued 10/09/2016 due to progression Bone metastases XGeva once every 3 months (started 01/12/2016)  Pulmonary emboli: Currently on xarelto  10/02/2016 CT CAP: New hepatic metastases with the right and left hepatic lobes. New lung metastases. Stable sclerotic  metastases within the pelvis and spine   Liver biopsy 10/15/2016: Metastatic carcinoma consistent with breast primary ER 100% PR 0%, HER-2 negative ratio 1.33  Treatment plan: 1. BRCA mutation analysis: Neg 2. 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 Exemestane with Everolimus started 11/15/2016 stopped 02/02/2017 due to progression  02/01/2017 CT CAP: Worsening progressive tumor throughout the liver with new and enlarging masses, stable bone metastases; 0.9 x 1.1 cm right apical lung nodule -------------------------------------------------------------- Current treatment: Carboplatin and gemcitabine, today is supposed to be her first treatment but treatment being held for severe anemia hemoglobin of 5.8  Plan is to transfuse her 1 pack with cells today and one more unit tomorrow. We will reschedule her chemotherapy sometime this week depending on availability.  Severe shortness of breath: Anemia  Return to  clinic to start chemotherapy     I spent 25 minutes talking to the patient of which more than half was spent in counseling and coordination of care.  No orders of the defined types were placed in this encounter.  The patient has a good understanding of the overall plan. she agrees with it. she will call with any problems that may develop before the next visit here.   Rulon Eisenmenger, MD 02/12/17

## 2017-02-12 NOTE — Assessment & Plan Note (Addendum)
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 Followed by 12 cycles of weekly Abraxane completed 07/19/2014 S/P Mastectomy 09/22/14: Bilateral mastectomies: Right: IDC Grade 2, 2.7cm, LVI, PNI, 6/8 LN Positive, Er 90%, PR 5% T2N2 (Stage IIIA) Nonhealing right breast wound: finally healed Adjuvant radiation therapy started 03/22/2015 to complete 05/11/2015  Current treatment: Exemestane with Everolimus  PET/CT scan 08/08/2015: Multifocal areas of increased hypermetabolic is in new/progressive but without definite underlying lesion on CT scan suspicious for metastases, T5, T7, left sacrum, left iliac bone; 13 mm gastrohepatic node SUV 5.2 Suspicious for nodal metastases. ----------------------------------------------------------------------------------------------------------------------------------------------------------------- Treatment plan:Ibrance with Faslodex. Started 08/12/2015 ; discontinued 10/09/2016 due to progression Bone metastases XGeva once every 3 months (started 01/12/2016)  Pulmonary emboli: Currently on xarelto  10/02/2016 CT CAP: New hepatic metastases with the right and left hepatic lobes. New lung metastases. Stable sclerotic metastases within the pelvis and spine   Liver biopsy 10/15/2016: Metastatic carcinoma consistent with breast primary ER 100% PR 0%, HER-2 negative ratio 1.33  Treatment plan: 1. BRCA mutation analysis: Neg 2. 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 Exemestane with Everolimus started 11/15/2016 stopped 02/02/2017 due to progression  02/01/2017 CT CAP: Worsening progressive tumor throughout the liver with new and enlarging masses, stable bone metastases; 0.9 x 1.1 cm right apical lung nodule -------------------------------------------------------------- Current treatment: Carboplatin  and gemcitabine, today is supposed to be her first treatment but treatment being held for severe anemia hemoglobin of 5.8  Plan is to transfuse her 1 pack with cells today and one more unit tomorrow. We will reschedule her chemotherapy sometime this week depending on availability.  Severe shortness of breath: Anemia  Return to clinic to start chemotherapy

## 2017-02-12 NOTE — Patient Instructions (Signed)
[place] Discharge Instructions for Patients Receiving Chemotherapy  Today you received the following chemotherapy agents gemzar  To help prevent nausea and vomiting after your treatment, we encourage you to take your nausea medication as directed  If you develop nausea and vomiting that is not controlled by your nausea medication, call the clinic.   BELOW ARE SYMPTOMS THAT SHOULD BE REPORTED IMMEDIATELY:  *FEVER GREATER THAN 100.5 F  *CHILLS WITH OR WITHOUT FEVER  NAUSEA AND VOMITING THAT IS NOT CONTROLLED WITH YOUR NAUSEA MEDICATION  *UNUSUAL SHORTNESS OF BREATH  *UNUSUAL BRUISING OR BLEEDING  TENDERNESS IN MOUTH AND THROAT WITH OR WITHOUT PRESENCE OF ULCERS  *URINARY PROBLEMS  *BOWEL PROBLEMS  UNUSUAL RASH Items with * indicate a potential emergency and should be followed up as soon as possible.  Feel free to call the clinic you have any questions or concerns. The clinic phone number is [phone].  Gemcitabine injection What is this medicine? GEMCITABINE (jem SIT a been) is a chemotherapy drug. This medicine is used to treat many types of cancer like breast cancer, lung cancer, pancreatic cancer, and ovarian cancer. This medicine may be used for other purposes; ask your health care provider or pharmacist if you have questions. COMMON BRAND NAME(S): Gemzar What should I tell my health care provider before I take this medicine? They need to know if you have any of these conditions: -blood disorders -infection -kidney disease -liver disease -recent or ongoing radiation therapy -an unusual or allergic reaction to gemcitabine, other chemotherapy, other medicines, foods, dyes, or preservatives -pregnant or trying to get pregnant -breast-feeding How should I use this medicine? This drug is given as an infusion into a vein. It is administered in a hospital or clinic by a specially trained health care professional. Talk to your pediatrician regarding  the use of this medicine in children. Special care may be needed. Overdosage: If you think you have taken too much of this medicine contact a poison control center or emergency room at once. NOTE: This medicine is only for you. Do not share this medicine with others. What if I miss a dose? It is important not to miss your dose. Call your doctor or health care professional if you are unable to keep an appointment. What may interact with this medicine? -medicines to increase blood counts like filgrastim, pegfilgrastim, sargramostim -some other chemotherapy drugs like cisplatin -vaccines Talk to your doctor or health care professional before taking any of these medicines: -acetaminophen -aspirin -ibuprofen -ketoprofen -naproxen This list may not describe all possible interactions. Give your health care provider a list of all the medicines, herbs, non-prescription drugs, or dietary supplements you use. Also tell them if you smoke, drink alcohol, or use illegal drugs. Some items may interact with your medicine. What should I watch for while using this medicine? Visit your doctor for checks on your progress. This drug may make you feel generally unwell. This is not uncommon, as chemotherapy can affect healthy cells as well as cancer cells. Report any side effects. Continue your course of treatment even though you feel ill unless your doctor tells you to stop. In some cases, you may be given additional medicines to help with side effects. Follow all directions for their use. Call your doctor or health care professional for advice if you get a fever, chills or sore throat, or other symptoms of a cold or flu. Do not treat yourself. This drug decreases your body's ability to fight infections. Try to avoid being  around people who are sick. This medicine may increase your risk to bruise or bleed. Call your doctor or health care professional if you notice any unusual bleeding. Be careful brushing and  flossing your teeth or using a toothpick because you may get an infection or bleed more easily. If you have any dental work done, tell your dentist you are receiving this medicine. Avoid taking products that contain aspirin, acetaminophen, ibuprofen, naproxen, or ketoprofen unless instructed by your doctor. These medicines may hide a fever. Women should inform their doctor if they wish to become pregnant or think they might be pregnant. There is a potential for serious side effects to an unborn child. Talk to your health care professional or pharmacist for more information. Do not breast-feed an infant while taking this medicine. What side effects may I notice from receiving this medicine? Side effects that you should report to your doctor or health care professional as soon as possible: -allergic reactions like skin rash, itching or hives, swelling of the face, lips, or tongue -low blood counts - this medicine may decrease the number of white blood cells, red blood cells and platelets. You may be at increased risk for infections and bleeding. -signs of infection - fever or chills, cough, sore throat, pain or difficulty passing urine -signs of decreased platelets or bleeding - bruising, pinpoint red spots on the skin, black, tarry stools, blood in the urine -signs of decreased red blood cells - unusually weak or tired, fainting spells, lightheadedness -breathing problems -chest pain -mouth sores -nausea and vomiting -pain, swelling, redness at site where injected -pain, tingling, numbness in the hands or feet -stomach pain -swelling of ankles, feet, hands -unusual bleeding Side effects that usually do not require medical attention (report to your doctor or health care professional if they continue or are bothersome): -constipation -diarrhea -hair loss -loss of appetite -stomach upset This list may not describe all possible side effects. Call your doctor for medical advice about side effects.  You may report side effects to FDA at 1-800-FDA-1088. Where should I keep my medicine? This drug is given in a hospital or clinic and will not be stored at home. NOTE: This sheet is a summary. It may not cover all possible information. If you have questions about this medicine, talk to your doctor, pharmacist, or health care provider.  2018 Elsevier/Gold Standard (2007-09-02 18:45:54)

## 2017-02-12 NOTE — Progress Notes (Signed)
Pt will be coming back today to have 1 unit of prbc infused. Per Dr.Gudena, okay to start 1st time chemo after blood transfusion. STAT cbc will need to be drawn 1 hr post prbc. Notified Infusion RN and is aware.

## 2017-02-12 NOTE — Telephone Encounter (Signed)
Scheduled appt per 10/9 and 10/8 sch message - per Rn May said patient needs blood instead of treatment this Friday. And okay to leave 10/15 appt where it is just add lab appt -  Friday is capped and unable to add transfusion - will call [facility] when they are open to see if she can get a transfusion there Friday - scheduled a transfusion appt for 10/13 just incase she is unable to get in at [facility] - patient is aware.

## 2017-02-13 ENCOUNTER — Telehealth: Payer: Self-pay | Admitting: Hematology and Oncology

## 2017-02-13 LAB — TYPE AND SCREEN
ABO/RH(D): A POS
Antibody Screen: NEGATIVE
Unit division: 0
Unit division: 0

## 2017-02-13 LAB — BPAM RBC
BLOOD PRODUCT EXPIRATION DATE: 201810232359
Blood Product Expiration Date: 201810222359
ISSUE DATE / TIME: 201810091314
ISSUE DATE / TIME: 201810081532
UNIT TYPE AND RH: 6200
Unit Type and Rh: 6200

## 2017-02-13 NOTE — Telephone Encounter (Signed)
Called and left message - confirming that the appt for [facility] is made for Friday and Saturday appt was cancelled .

## 2017-02-15 ENCOUNTER — Other Ambulatory Visit: Payer: Self-pay | Admitting: *Deleted

## 2017-02-15 ENCOUNTER — Ambulatory Visit (HOSPITAL_COMMUNITY)
Admission: RE | Admit: 2017-02-15 | Discharge: 2017-02-15 | Disposition: A | Discharge status: Home / Self Care | Payer: Medicare Other | Source: Ambulatory Visit | Attending: Hematology and Oncology | Admitting: Hematology and Oncology

## 2017-02-15 ENCOUNTER — Other Ambulatory Visit (HOSPITAL_BASED_OUTPATIENT_CLINIC_OR_DEPARTMENT_OTHER): Payer: Medicare Other

## 2017-02-15 DIAGNOSIS — Z170 Estrogen receptor positive status [ER+]: Principal | ICD-10-CM

## 2017-02-15 DIAGNOSIS — C50411 Malignant neoplasm of upper-outer quadrant of right female breast: Secondary | ICD-10-CM

## 2017-02-15 DIAGNOSIS — D649 Anemia, unspecified: Secondary | ICD-10-CM

## 2017-02-15 LAB — COMPREHENSIVE METABOLIC PANEL
ALT: 100 U/L — ABNORMAL HIGH (ref 0–55)
AST: 301 U/L (ref 5–34)
Albumin: 1.8 g/dL — ABNORMAL LOW (ref 3.5–5.0)
Alkaline Phosphatase: 744 U/L — ABNORMAL HIGH (ref 40–150)
Anion Gap: 10 mEq/L (ref 3–11)
BUN: 22.5 mg/dL (ref 7.0–26.0)
CO2: 22 meq/L (ref 22–29)
Calcium: 8.2 mg/dL — ABNORMAL LOW (ref 8.4–10.4)
Chloride: 109 mEq/L (ref 98–109)
Creatinine: 0.9 mg/dL (ref 0.6–1.1)
GLUCOSE: 115 mg/dL (ref 70–140)
POTASSIUM: 3.7 meq/L (ref 3.5–5.1)
SODIUM: 140 meq/L (ref 136–145)
Total Bilirubin: 4.15 mg/dL (ref 0.20–1.20)
Total Protein: 5.3 g/dL — ABNORMAL LOW (ref 6.4–8.3)

## 2017-02-15 LAB — CBC WITH DIFFERENTIAL/PLATELET
BASO%: 0.3 % (ref 0.0–2.0)
BASOS ABS: 0 10*3/uL (ref 0.0–0.1)
EOS%: 0.4 % (ref 0.0–7.0)
Eosinophils Absolute: 0 10*3/uL (ref 0.0–0.5)
HCT: 23.4 % — ABNORMAL LOW (ref 34.8–46.6)
HGB: 7.2 g/dL — ABNORMAL LOW (ref 11.6–15.9)
LYMPH%: 6.8 % — ABNORMAL LOW (ref 14.0–49.7)
MCH: 25.9 pg (ref 25.1–34.0)
MCHC: 30.8 g/dL — ABNORMAL LOW (ref 31.5–36.0)
MCV: 84.2 fL (ref 79.5–101.0)
MONO#: 0.2 10*3/uL (ref 0.1–0.9)
MONO%: 2.5 % (ref 0.0–14.0)
NEUT#: 6.2 10*3/uL (ref 1.5–6.5)
NEUT%: 90 % — AB (ref 38.4–76.8)
Platelets: 235 10*3/uL (ref 145–400)
RBC: 2.78 10*6/uL — AB (ref 3.70–5.45)
RDW: 18.5 % — AB (ref 11.2–14.5)
WBC: 6.9 10*3/uL (ref 3.9–10.3)
lymph#: 0.5 10*3/uL — ABNORMAL LOW (ref 0.9–3.3)
nRBC: 0 % (ref 0–0)

## 2017-02-15 LAB — PREPARE RBC (CROSSMATCH)

## 2017-02-15 MED ORDER — DIPHENHYDRAMINE HCL 25 MG PO CAPS
25.0000 mg | ORAL_CAPSULE | Freq: Once | ORAL | Status: AC
  Administered 2017-02-15: 25 mg via ORAL
  Filled 2017-02-15: qty 1

## 2017-02-15 MED ORDER — ACETAMINOPHEN 325 MG PO TABS
650.0000 mg | ORAL_TABLET | Freq: Once | ORAL | Status: AC
  Administered 2017-02-15: 650 mg via ORAL
  Filled 2017-02-15: qty 2

## 2017-02-15 MED ORDER — HEPARIN SOD (PORK) LOCK FLUSH 100 UNIT/ML IV SOLN
500.0000 [IU] | Freq: Every day | INTRAVENOUS | Status: AC | PRN
  Administered 2017-02-15: 500 [IU]
  Filled 2017-02-15: qty 5

## 2017-02-15 MED ORDER — SODIUM CHLORIDE 0.9 % IV SOLN: 250.0000 mL | Freq: Once | INTRAVENOUS | Status: DC

## 2017-02-15 MED ORDER — SODIUM CHLORIDE 0.9% FLUSH: 3.0000 mL | INTRAVENOUS | Status: DC | PRN

## 2017-02-15 MED ORDER — HEPARIN SOD (PORK) LOCK FLUSH 100 UNIT/ML IV SOLN: 250.0000 [IU] | INTRAVENOUS | Status: DC | PRN

## 2017-02-15 MED ORDER — SODIUM CHLORIDE 0.9% FLUSH: 10.0000 mL | INTRAVENOUS | Status: DC | PRN

## 2017-02-15 NOTE — Discharge Instructions (Signed)

## 2017-02-15 NOTE — Progress Notes (Signed)
Procedure: Transfusion of 2 units PRBCs; port accessed and deaccessed with no complicatons noted  Ordering Provider: Jennette Kettle.  Associated Diagnosis: Symptomatic anemia  Pt discharged home; discharge instructions explained, given, and signed

## 2017-02-16 LAB — TYPE AND SCREEN
ABO/RH(D): A POS
Antibody Screen: NEGATIVE
Unit division: 0
Unit division: 0

## 2017-02-16 LAB — BPAM RBC
BLOOD PRODUCT EXPIRATION DATE: 201810182359
BLOOD PRODUCT EXPIRATION DATE: 201810232359
ISSUE DATE / TIME: 201810121218
ISSUE DATE / TIME: 201810121218
UNIT TYPE AND RH: 6200
UNIT TYPE AND RH: 6200

## 2017-02-18 ENCOUNTER — Ambulatory Visit (HOSPITAL_BASED_OUTPATIENT_CLINIC_OR_DEPARTMENT_OTHER): Payer: Medicare Other | Admitting: Hematology and Oncology

## 2017-02-18 ENCOUNTER — Ambulatory Visit (HOSPITAL_BASED_OUTPATIENT_CLINIC_OR_DEPARTMENT_OTHER): Payer: Medicare Other

## 2017-02-18 ENCOUNTER — Other Ambulatory Visit (HOSPITAL_BASED_OUTPATIENT_CLINIC_OR_DEPARTMENT_OTHER): Payer: Medicare Other

## 2017-02-18 DIAGNOSIS — Z5111 Encounter for antineoplastic chemotherapy: Secondary | ICD-10-CM

## 2017-02-18 DIAGNOSIS — C50411 Malignant neoplasm of upper-outer quadrant of right female breast: Secondary | ICD-10-CM

## 2017-02-18 DIAGNOSIS — D6481 Anemia due to antineoplastic chemotherapy: Secondary | ICD-10-CM

## 2017-02-18 DIAGNOSIS — Z170 Estrogen receptor positive status [ER+]: Principal | ICD-10-CM

## 2017-02-18 LAB — COMPREHENSIVE METABOLIC PANEL
ALT: 139 U/L — AB (ref 0–55)
AST: 389 U/L — AB (ref 5–34)
Albumin: 1.7 g/dL — ABNORMAL LOW (ref 3.5–5.0)
Alkaline Phosphatase: 851 U/L — ABNORMAL HIGH (ref 40–150)
Anion Gap: 10 mEq/L (ref 3–11)
BUN: 34.8 mg/dL — AB (ref 7.0–26.0)
CHLORIDE: 107 meq/L (ref 98–109)
CO2: 21 meq/L — AB (ref 22–29)
CREATININE: 1.4 mg/dL — AB (ref 0.6–1.1)
Calcium: 7.9 mg/dL — ABNORMAL LOW (ref 8.4–10.4)
EGFR: 38 mL/min/{1.73_m2} — ABNORMAL LOW (ref >60)
Glucose: 283 mg/dl — ABNORMAL HIGH (ref 70–140)
POTASSIUM: 4.2 meq/L (ref 3.5–5.1)
SODIUM: 138 meq/L (ref 136–145)
Total Bilirubin: 6.68 mg/dL (ref 0.20–1.20)
Total Protein: 5.3 g/dL — ABNORMAL LOW (ref 6.4–8.3)

## 2017-02-18 LAB — CBC WITH DIFFERENTIAL/PLATELET
BASO%: 1 % (ref 0.0–2.0)
Basophils Absolute: 0 10*3/uL (ref 0.0–0.1)
EOS%: 1 % (ref 0.0–7.0)
Eosinophils Absolute: 0 10*3/uL (ref 0.0–0.5)
HCT: 28.1 % — ABNORMAL LOW (ref 34.8–46.6)
HGB: 9 g/dL — ABNORMAL LOW (ref 11.6–15.9)
LYMPH%: 23.1 % (ref 14.0–49.7)
MCH: 27 pg (ref 25.1–34.0)
MCHC: 32 g/dL (ref 31.5–36.0)
MCV: 84.4 fL (ref 79.5–101.0)
MONO#: 0.2 10*3/uL (ref 0.1–0.9)
MONO%: 11.8 % (ref 0.0–14.0)
NEUT%: 63.1 % (ref 38.4–76.8)
NEUTROS ABS: 1.2 10*3/uL — AB (ref 1.5–6.5)
Platelets: 133 10*3/uL — ABNORMAL LOW (ref 145–400)
RBC: 3.33 10*6/uL — AB (ref 3.70–5.45)
RDW: 17.8 % — ABNORMAL HIGH (ref 11.2–14.5)
WBC: 2 10*3/uL — AB (ref 3.9–10.3)
lymph#: 0.5 10*3/uL — ABNORMAL LOW (ref 0.9–3.3)
nRBC: 8 % — ABNORMAL HIGH (ref 0–0)

## 2017-02-18 LAB — TECHNOLOGIST REVIEW

## 2017-02-18 MED ORDER — SODIUM CHLORIDE 0.9 % IV SOLN: 800.0000 mg/m2 | Freq: Once | INTRAVENOUS | Status: DC

## 2017-02-18 MED ORDER — SODIUM CHLORIDE 0.9% FLUSH
10.0000 mL | INTRAVENOUS | Status: DC | PRN
  Administered 2017-02-18: 10 mL
  Filled 2017-02-18: qty 10

## 2017-02-18 MED ORDER — PROCHLORPERAZINE MALEATE 10 MG PO TABS
ORAL_TABLET | ORAL | Status: AC
  Filled 2017-02-18: qty 1

## 2017-02-18 MED ORDER — HEPARIN SOD (PORK) LOCK FLUSH 100 UNIT/ML IV SOLN
500.0000 [IU] | Freq: Once | INTRAVENOUS | Status: AC | PRN
  Administered 2017-02-18: 500 [IU]
  Filled 2017-02-18: qty 5

## 2017-02-18 MED ORDER — SODIUM CHLORIDE 0.9 % IV SOLN
Freq: Once | INTRAVENOUS | Status: AC
  Administered 2017-02-18: 16:00:00 via INTRAVENOUS

## 2017-02-18 MED ORDER — PROCHLORPERAZINE MALEATE 10 MG PO TABS
10.0000 mg | ORAL_TABLET | Freq: Once | ORAL | Status: AC
  Administered 2017-02-18: 10 mg via ORAL

## 2017-02-18 MED ORDER — SODIUM CHLORIDE 0.9 % IV SOLN
2000.0000 mg | Freq: Once | INTRAVENOUS | Status: AC
  Administered 2017-02-18: 2000 mg via INTRAVENOUS
  Filled 2017-02-18: qty 52.6

## 2017-02-18 NOTE — Progress Notes (Signed)
Okay to treat with [facility] 1.2 today, per Dr.Gudena.

## 2017-02-18 NOTE — Patient Instructions (Signed)
[facility] Discharge Instructions for Patients Receiving Chemotherapy  Today you received the following chemotherapy agents Gemzar To help prevent nausea and vomiting after your treatment, we encourage you to take your nausea medication as prescribed.  If you develop nausea and vomiting that is not controlled by your nausea medication, call the clinic.   BELOW ARE SYMPTOMS THAT SHOULD BE REPORTED IMMEDIATELY:  *FEVER GREATER THAN 100.5 F  *CHILLS WITH OR WITHOUT FEVER  NAUSEA AND VOMITING THAT IS NOT CONTROLLED WITH YOUR NAUSEA MEDICATION  *UNUSUAL SHORTNESS OF BREATH  *UNUSUAL BRUISING OR BLEEDING  TENDERNESS IN MOUTH AND THROAT WITH OR WITHOUT PRESENCE OF ULCERS  *URINARY PROBLEMS  *BOWEL PROBLEMS  UNUSUAL RASH Items with * indicate a potential emergency and should be followed up as soon as possible.  Feel free to call the clinic should you have any questions or concerns. The clinic phone number is [phone].  Please show the CHEMO ALERT CARD at check-in to the Emergency Department and triage nurse.   

## 2017-02-18 NOTE — Assessment & Plan Note (Signed)
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 Followed by 12 cycles of weekly Abraxane completed 07/19/2014 1. S/P Mastectomy 09/22/14: Bilateral mastectomies: Right: IDC Grade 2, 2.7cm, LVI, PNI, 6/8 LN Positive, Er 90%, PR 5% T2N2 (Stage IIIA) Nonhealing right breast wound: finally healed 2. Adjuvant radiation therapy started 03/22/2015 to complete 05/11/2015 Metastatic disease diagnosed 08/08/2015 3. Ibrance with Faslodex. 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 Exemestane with Everolimus 11/12/2016- 02/12/2017 discontinued due to progression ---------------------------------------------------------------------------------------------------------------------- Current treatment: carboplatin and gemcitabine: Cycle 1 day 8 started 02/12/2017 1. Severe anemia: Patient received blood transfusions I am concerned that she may have bone marrow involvement. 2. Marked elevation of LFTs due to liver metastases  Return to clinic in 2 weeks for cycle 2

## 2017-02-18 NOTE — Progress Notes (Signed)
Patient Care Team: Jacelyn Pi, MD as PCP - General (Endocrinology)  DIAGNOSIS:  Encounter Diagnosis  Name Primary?  . Malignant neoplasm of upper-outer quadrant of right breast in female, estrogen receptor positive ([place])     SUMMARY OF ONCOLOGIC HISTORY:   Breast cancer of upper-outer quadrant of right female breast ([place])   02/02/2014 Mammogram    Right breast: 5.8 cm irregular high-density solid mass suggestive of malignancy; left breast next millimeter internal mammary lymph node      02/04/2014 Initial Biopsy     2 biopsies were performed the right breast and axilla: Grade 2 invasive ductal carcinoma ER/PR positive HER-2 negative Ki-67 20% to 47%: Biopsy of right humerus metastatic breast cancer estrogen positive      02/17/2014 PET scan    Hypermetabolic right breast cancer and right axillary lymph nodes subpectoral lymph nodes indeterminate right middle lobe pulmonary nodule and hypermetabolic proximal right humerus lesion      03/05/2014 - 07/19/2014 Neo-Adjuvant Chemotherapy    Dose dense Adriamycin and Cytoxan x4 cycles followed by Abraxane weekly x12      07/26/2014 Breast MRI    Multifocal breast cancer decreased in size, retroareolar 13 mm now 11 mm, middle third 22 mm now 18 mm, 3.9 x 3.9 cm now 3.9 x 2.6 cm, multiple smaller nodules decreased in size, right x-ray lymph node 3.6 cm now 2 cm other lymph nodes are smaller      07/28/2014 - 08/12/2015 Anti-estrogen oral therapy    Anastrozole 1 mg daily      08/02/2014 PET scan    Interval improvement in the right breast mass and axillary/subpectoral lymph nodes and right humerus head. Several subpleural nodules are not visible, 3.4. cm adrenal adenoma      09/22/2014 Surgery    Bilateral mastectomy: Right: IDC Grade 2, 2.7cm, LVI, PNI, 6/8 LN Positive, Er 90%, PR 5% T2N2      03/22/2015 -  Radiation Therapy    adjuvant radiation therapy with Dr. Isidore Moos      08/10/2015 PET scan    Progression of disease with  several new bone metastases, T5, T7, left sacrum, left iliac bone, gastrohepatic lymph node      08/12/2015 - 10/30/2016 Anti-estrogen oral therapy    Faslodex with Brock Bad every 3 months for bone metastases (stopped for progression of liver metastases)      11/12/2016 -  Anti-estrogen oral therapy    Exemestane Everolimus      02/01/2017 Imaging    CT CAP: Worsening progressive tumor throughout the liver with new and enlarging masses, stable bone metastases; 0.9 x 1.1 cm right apical lung nodule       CHIEF COMPLIANT: cycle 1 day 8 gemcitabine  INTERVAL HISTORY: Tonya Alvarez is a 71 year old with above-mentioned history of metastatic breast cancer who is currently on gemcitabine. Today is cycle 1 day 8. She was noted to have profound anemia last week and received 4 units of packed red cells. Her breathing has improved but she continues to have shortness of breath to minimal exertion. She is using a wheelchair to come in today. She has gotten weak enough at home that she is planning to move to assisted living facility.  REVIEW OF SYSTEMS:   Constitutional: Denies fevers, chills or abnormal weight loss Eyes: Denies blurriness of vision Ears, nose, mouth, throat, and face: Denies mucositis or sore throat Respiratory: shortness of breath to minimal exertion using a wheelchair for ambulation Cardiovascular: Denies palpitation, chest discomfort Gastrointestinal:  Denies nausea, heartburn or change in bowel habits Skin: Denies abnormal skin rashes Lymphatics: Denies new lymphadenopathy or easy bruising Neurological:Denies numbness, tingling or new weaknesses Behavioral/Psych: Mood is stable, no new changes  Extremities: 2+ lower extremity edema All other systems were reviewed with the patient and are negative.  I have reviewed the past medical history, past surgical history, social history and family history with the patient and they are unchanged from previous note.  ALLERGIES:  is  allergic to amoxicillin; hydrocodone; and lisinopril.  MEDICATIONS:  Current Outpatient Prescriptions  Medication Sig Dispense Refill  . calcium-vitamin D (OSCAL WITH D) 500-200 MG-UNIT per tablet Take 1 tablet by mouth daily.    . clindamycin (CLEOCIN) 300 MG capsule Take 900 mg by mouth See admin instructions. Take 3 capsules (900 mg) by mouth one hour prior to dental appointment - last appointment October 11, 2015    . doxycycline (VIBRA-TABS) 100 MG tablet Take 1 tablet (100 mg total) by mouth 2 (two) times daily. (Patient not taking: Reported on 02/06/2017) 20 tablet 0  . fluticasone (FLONASE) 50 MCG/ACT nasal spray Place 1 spray into both nostrils daily as needed for allergies or rhinitis. (Patient taking differently: Place 1 spray into both nostrils at bedtime as needed for allergies or rhinitis. ) 16 g 0  . furosemide (LASIX) 40 MG tablet TAKE 1 TABLET BY MOUTH  DAILY (Patient taking differently: TAKE 0.5 TABLET BY MOUTH  DAILY) 90 tablet 1  . gabapentin (NEURONTIN) 300 MG capsule Take 300 mg by mouth 3 (three) times daily.     Marland Kitchen ibuprofen (ADVIL,MOTRIN) 600 MG tablet Take 600 mg by mouth every 12 (twelve) hours.    . insulin lispro protamine-lispro (HUMALOG 75/25 MIX) (75-25) 100 UNIT/ML SUSP injection Inject 10-60 Units into the skin See admin instructions. Inject 60 units subcutaneously with breakfast and supper, inject 10 mls after lunch if CBG >150    . lidocaine-prilocaine (EMLA) cream Apply 1 application topically as needed. (Patient not taking: Reported on 01/09/2017) 30 g 6  . lidocaine-prilocaine (EMLA) cream Apply to affected area once (Patient not taking: Reported on 02/06/2017) 30 g 3  . Liniments (SALONPAS PAIN RELIEF PATCH EX) Place 1 patch onto the skin daily as needed (PAIN).    Marland Kitchen metFORMIN (GLUCOPHAGE-XR) 500 MG 24 hr tablet Take 2 tablets (1,000 mg total) by mouth 2 (two) times daily. (Patient not taking: Reported on 01/09/2017)    . Multiple Vitamin (MULTIVITAMIN WITH MINERALS)  TABS tablet Take 1 tablet by mouth at bedtime.    . ondansetron (ZOFRAN) 8 MG tablet Take 1 tablet (8 mg total) by mouth 2 (two) times daily as needed (Nausea or vomiting). (Patient not taking: Reported on 02/06/2017) 30 tablet 1  . prochlorperazine (COMPAZINE) 10 MG tablet Take 1 tablet (10 mg total) by mouth every 6 (six) hours as needed (Nausea or vomiting). 30 tablet 1  . rivaroxaban (XARELTO) 20 MG TABS tablet TAKE 1 TABLET BY MOUTH  DAILY WITH SUPPER 30 tablet 11   No current facility-administered medications for this visit.     PHYSICAL EXAMINATION: ECOG PERFORMANCE STATUS: 1 - Symptomatic but completely ambulatory  Vitals:   02/18/17 1453  BP: 130/61  Pulse: 94  Resp: 18  Temp: 97.7 F (36.5 C)  SpO2: 98%   Filed Weights   02/18/17 1453  Weight: (!) 314 lb 9.6 oz (142.7 kg)    GENERAL:alert, no distress and comfortable SKIN: skin color, texture, turgor are normal, no rashes or significant lesions EYES:  normal, Conjunctiva are pink and non-injected, sclera clear OROPHARYNX:no exudate, no erythema and lips, buccal mucosa, and tongue normal  NECK: supple, thyroid normal size, non-tender, without nodularity LYMPH:  no palpable lymphadenopathy in the cervical, axillary or inguinal LUNGS: shortness of breath to minimal exertion HEART: regular rate & rhythm and no murmurs and no lower extremity edema ABDOMEN:abdomen soft, non-tender and normal bowel sounds MUSCULOSKELETAL:no cyanosis of digits and no clubbing  NEURO: alert & oriented x 3 with fluent speech, no focal motor/sensory deficits EXTREMITIES: 2+ lower extremity edema   LABORATORY DATA:  I have reviewed the data as listed   Chemistry      Component Value Date/Time   NA 138 02/18/2017 1433   K 4.2 02/18/2017 1433   CL 107 02/06/2017 1343   CO2 21 (L) 02/18/2017 1433   BUN 34.8 (H) 02/18/2017 1433   CREATININE 1.4 (H) 02/18/2017 1433      Component Value Date/Time   CALCIUM 7.9 (L) 02/18/2017 1433   ALKPHOS  851 (H) 02/18/2017 1433   AST 389 (HH) 02/18/2017 1433   ALT 139 (H) 02/18/2017 1433   BILITOT 6.68 (HH) 02/18/2017 1433       Lab Results  Component Value Date   WBC 2.0 (L) 02/18/2017   HGB 9.0 (L) 02/18/2017   HCT 28.1 (L) 02/18/2017   MCV 84.4 02/18/2017   PLT 133 (L) 02/18/2017   NEUTROABS 1.2 (L) 02/18/2017    ASSESSMENT & PLAN:  Breast cancer of upper-outer quadrant of right female breast ([place]) 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 Followed by 12 cycles of weekly Abraxane completed 07/19/2014 1. S/P Mastectomy 09/22/14: Bilateral mastectomies: Right: IDC Grade 2, 2.7cm, LVI, PNI, 6/8 LN Positive, Er 90%, PR 5% T2N2 (Stage IIIA) Nonhealing right breast wound: finally healed 2. Adjuvant radiation therapy started 03/22/2015 to complete 05/11/2015 Metastatic disease diagnosed 08/08/2015 3. Ibrance with Faslodex. 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 Exemestane with Everolimus 11/12/2016- 02/12/2017 discontinued due to progression ---------------------------------------------------------------------------------------------------------------------- Current treatment: carboplatin and gemcitabine: Cycle 1 day 8 started 02/12/2017 1. Severe anemia: Patient received 4 blood transfusions 2. Marked elevation of LFTs due to liver metastases: Bilirubin is up to 6 and AST ALT have increased 389 and 139. Okay to treat with the LFT abnormalities along with ANC of 1.2  Goals of care: Palliation of symptoms on prolonged duration of life. Patient understands that there is no cure for metastatic breast cancer.She also understands that she has poor prognosis.  The patient continues to have increase  her LFTs, then we may have to stop all therapy and consider hospice Return to clinic in 2 weeks for cycle 2  I spent 25 minutes talking to the patient of which more than half was spent in counseling and coordination of care.  No orders of the defined types were placed in this encounter.  The patient has a good understanding of the overall plan. she agrees with it. she will call with any problems that may develop before the next visit here.   Rulon Eisenmenger, MD 02/18/17

## 2017-02-20 ENCOUNTER — Telehealth: Payer: Self-pay | Admitting: Hematology and Oncology

## 2017-02-20 NOTE — Telephone Encounter (Signed)
Scheduled appt per 10/15 los - patient is aware of time change.

## 2017-02-21 ENCOUNTER — Inpatient Hospital Stay (HOSPITAL_COMMUNITY)
Admission: EM | Admit: 2017-02-21 | Discharge: 2017-03-07 | DRG: 871 | Disposition: E | Payer: Medicare Other | Attending: Pulmonary Disease | Admitting: Pulmonary Disease

## 2017-02-21 ENCOUNTER — Encounter (HOSPITAL_COMMUNITY): Payer: Self-pay

## 2017-02-21 ENCOUNTER — Other Ambulatory Visit: Payer: Self-pay

## 2017-02-21 ENCOUNTER — Emergency Department (HOSPITAL_COMMUNITY): Payer: Medicare Other

## 2017-02-21 DIAGNOSIS — I2699 Other pulmonary embolism without acute cor pulmonale: Secondary | ICD-10-CM | POA: Diagnosis present

## 2017-02-21 DIAGNOSIS — Z7189 Other specified counseling: Secondary | ICD-10-CM

## 2017-02-21 DIAGNOSIS — J9601 Acute respiratory failure with hypoxia: Secondary | ICD-10-CM | POA: Diagnosis present

## 2017-02-21 DIAGNOSIS — Z9013 Acquired absence of bilateral breasts and nipples: Secondary | ICD-10-CM

## 2017-02-21 DIAGNOSIS — N289 Disorder of kidney and ureter, unspecified: Secondary | ICD-10-CM | POA: Diagnosis not present

## 2017-02-21 DIAGNOSIS — C7801 Secondary malignant neoplasm of right lung: Secondary | ICD-10-CM | POA: Diagnosis not present

## 2017-02-21 DIAGNOSIS — Z96652 Presence of left artificial knee joint: Secondary | ICD-10-CM | POA: Diagnosis present

## 2017-02-21 DIAGNOSIS — C7802 Secondary malignant neoplasm of left lung: Secondary | ICD-10-CM | POA: Diagnosis present

## 2017-02-21 DIAGNOSIS — Z170 Estrogen receptor positive status [ER+]: Secondary | ICD-10-CM | POA: Diagnosis not present

## 2017-02-21 DIAGNOSIS — C50411 Malignant neoplasm of upper-outer quadrant of right female breast: Secondary | ICD-10-CM | POA: Diagnosis present

## 2017-02-21 DIAGNOSIS — Z66 Do not resuscitate: Secondary | ICD-10-CM | POA: Diagnosis not present

## 2017-02-21 DIAGNOSIS — M170 Bilateral primary osteoarthritis of knee: Secondary | ICD-10-CM | POA: Diagnosis present

## 2017-02-21 DIAGNOSIS — R069 Unspecified abnormalities of breathing: Secondary | ICD-10-CM | POA: Diagnosis not present

## 2017-02-21 DIAGNOSIS — K7290 Hepatic failure, unspecified without coma: Secondary | ICD-10-CM | POA: Diagnosis not present

## 2017-02-21 DIAGNOSIS — R571 Hypovolemic shock: Secondary | ICD-10-CM | POA: Diagnosis present

## 2017-02-21 DIAGNOSIS — R0602 Shortness of breath: Secondary | ICD-10-CM

## 2017-02-21 DIAGNOSIS — M5030 Other cervical disc degeneration, unspecified cervical region: Secondary | ICD-10-CM | POA: Diagnosis present

## 2017-02-21 DIAGNOSIS — D61810 Antineoplastic chemotherapy induced pancytopenia: Secondary | ICD-10-CM | POA: Diagnosis not present

## 2017-02-21 DIAGNOSIS — A419 Sepsis, unspecified organism: Principal | ICD-10-CM | POA: Diagnosis present

## 2017-02-21 DIAGNOSIS — R180 Malignant ascites: Secondary | ICD-10-CM

## 2017-02-21 DIAGNOSIS — F419 Anxiety disorder, unspecified: Secondary | ICD-10-CM | POA: Diagnosis present

## 2017-02-21 DIAGNOSIS — C787 Secondary malignant neoplasm of liver and intrahepatic bile duct: Secondary | ICD-10-CM | POA: Diagnosis not present

## 2017-02-21 DIAGNOSIS — Z888 Allergy status to other drugs, medicaments and biological substances status: Secondary | ICD-10-CM

## 2017-02-21 DIAGNOSIS — M19072 Primary osteoarthritis, left ankle and foot: Secondary | ICD-10-CM | POA: Diagnosis not present

## 2017-02-21 DIAGNOSIS — D696 Thrombocytopenia, unspecified: Secondary | ICD-10-CM | POA: Diagnosis present

## 2017-02-21 DIAGNOSIS — I10 Essential (primary) hypertension: Secondary | ICD-10-CM | POA: Diagnosis present

## 2017-02-21 DIAGNOSIS — J810 Acute pulmonary edema: Secondary | ICD-10-CM | POA: Diagnosis not present

## 2017-02-21 DIAGNOSIS — C7951 Secondary malignant neoplasm of bone: Secondary | ICD-10-CM | POA: Diagnosis present

## 2017-02-21 DIAGNOSIS — T451X5A Adverse effect of antineoplastic and immunosuppressive drugs, initial encounter: Secondary | ICD-10-CM | POA: Diagnosis present

## 2017-02-21 DIAGNOSIS — E1142 Type 2 diabetes mellitus with diabetic polyneuropathy: Secondary | ICD-10-CM | POA: Diagnosis not present

## 2017-02-21 DIAGNOSIS — I340 Nonrheumatic mitral (valve) insufficiency: Secondary | ICD-10-CM | POA: Diagnosis present

## 2017-02-21 DIAGNOSIS — M5137 Other intervertebral disc degeneration, lumbosacral region: Secondary | ICD-10-CM | POA: Diagnosis present

## 2017-02-21 DIAGNOSIS — R0902 Hypoxemia: Secondary | ICD-10-CM | POA: Diagnosis not present

## 2017-02-21 DIAGNOSIS — J9600 Acute respiratory failure, unspecified whether with hypoxia or hypercapnia: Secondary | ICD-10-CM | POA: Diagnosis not present

## 2017-02-21 DIAGNOSIS — Z515 Encounter for palliative care: Secondary | ICD-10-CM | POA: Diagnosis not present

## 2017-02-21 DIAGNOSIS — Z794 Long term (current) use of insulin: Secondary | ICD-10-CM

## 2017-02-21 DIAGNOSIS — Z87891 Personal history of nicotine dependence: Secondary | ICD-10-CM

## 2017-02-21 DIAGNOSIS — Z8249 Family history of ischemic heart disease and other diseases of the circulatory system: Secondary | ICD-10-CM

## 2017-02-21 DIAGNOSIS — G893 Neoplasm related pain (acute) (chronic): Secondary | ICD-10-CM | POA: Diagnosis present

## 2017-02-21 DIAGNOSIS — M19071 Primary osteoarthritis, right ankle and foot: Secondary | ICD-10-CM | POA: Diagnosis present

## 2017-02-21 DIAGNOSIS — Z452 Encounter for adjustment and management of vascular access device: Secondary | ICD-10-CM | POA: Diagnosis not present

## 2017-02-21 DIAGNOSIS — N179 Acute kidney failure, unspecified: Secondary | ICD-10-CM | POA: Diagnosis not present

## 2017-02-21 DIAGNOSIS — R6521 Severe sepsis with septic shock: Secondary | ICD-10-CM | POA: Diagnosis not present

## 2017-02-21 DIAGNOSIS — Z6841 Body Mass Index (BMI) 40.0 and over, adult: Secondary | ICD-10-CM | POA: Diagnosis not present

## 2017-02-21 DIAGNOSIS — R188 Other ascites: Secondary | ICD-10-CM | POA: Diagnosis present

## 2017-02-21 DIAGNOSIS — Z801 Family history of malignant neoplasm of trachea, bronchus and lung: Secondary | ICD-10-CM

## 2017-02-21 DIAGNOSIS — E872 Acidosis: Secondary | ICD-10-CM | POA: Diagnosis not present

## 2017-02-21 DIAGNOSIS — Z885 Allergy status to narcotic agent status: Secondary | ICD-10-CM

## 2017-02-21 DIAGNOSIS — D6481 Anemia due to antineoplastic chemotherapy: Secondary | ICD-10-CM | POA: Diagnosis present

## 2017-02-21 DIAGNOSIS — D649 Anemia, unspecified: Secondary | ICD-10-CM | POA: Diagnosis not present

## 2017-02-21 DIAGNOSIS — E785 Hyperlipidemia, unspecified: Secondary | ICD-10-CM | POA: Diagnosis present

## 2017-02-21 DIAGNOSIS — J9811 Atelectasis: Secondary | ICD-10-CM | POA: Diagnosis not present

## 2017-02-21 DIAGNOSIS — Z7901 Long term (current) use of anticoagulants: Secondary | ICD-10-CM

## 2017-02-21 DIAGNOSIS — M545 Low back pain: Secondary | ICD-10-CM | POA: Diagnosis present

## 2017-02-21 DIAGNOSIS — Z9289 Personal history of other medical treatment: Secondary | ICD-10-CM

## 2017-02-21 DIAGNOSIS — E119 Type 2 diabetes mellitus without complications: Secondary | ICD-10-CM

## 2017-02-21 DIAGNOSIS — M5135 Other intervertebral disc degeneration, thoracolumbar region: Secondary | ICD-10-CM | POA: Diagnosis present

## 2017-02-21 DIAGNOSIS — R197 Diarrhea, unspecified: Secondary | ICD-10-CM | POA: Diagnosis present

## 2017-02-21 DIAGNOSIS — Z79899 Other long term (current) drug therapy: Secondary | ICD-10-CM

## 2017-02-21 DIAGNOSIS — Z880 Allergy status to penicillin: Secondary | ICD-10-CM

## 2017-02-21 DIAGNOSIS — R0600 Dyspnea, unspecified: Secondary | ICD-10-CM | POA: Diagnosis not present

## 2017-02-21 DIAGNOSIS — Z7951 Long term (current) use of inhaled steroids: Secondary | ICD-10-CM

## 2017-02-21 LAB — BLOOD GAS, ARTERIAL
Acid-base deficit: 8.2 mmol/L — ABNORMAL HIGH (ref 0.0–2.0)
BICARBONATE: 16.3 mmol/L — AB (ref 20.0–28.0)
DRAWN BY: 331471
O2 Content: 2 L/min
O2 Saturation: 98.2 %
PH ART: 7.337 — AB (ref 7.350–7.450)
Patient temperature: 98.6
pCO2 arterial: 31.3 mmHg — ABNORMAL LOW (ref 32.0–48.0)
pO2, Arterial: 126 mmHg — ABNORMAL HIGH (ref 83.0–108.0)

## 2017-02-21 LAB — CBC WITH DIFFERENTIAL/PLATELET
Basophils Absolute: 0 10*3/uL (ref 0.0–0.1)
Basophils Relative: 1 %
EOS ABS: 0 10*3/uL (ref 0.0–0.7)
EOS PCT: 0 %
HCT: 24 % — ABNORMAL LOW (ref 36.0–46.0)
HEMOGLOBIN: 7.9 g/dL — AB (ref 12.0–15.0)
LYMPHS ABS: 0.1 10*3/uL — AB (ref 0.7–4.0)
LYMPHS PCT: 3 %
MCH: 27.2 pg (ref 26.0–34.0)
MCHC: 32.9 g/dL (ref 30.0–36.0)
MCV: 82.8 fL (ref 78.0–100.0)
MONOS PCT: 1 %
Monocytes Absolute: 0 10*3/uL — ABNORMAL LOW (ref 0.1–1.0)
Neutro Abs: 1.8 10*3/uL (ref 1.7–7.7)
Neutrophils Relative %: 96 %
PLATELETS: 60 10*3/uL — AB (ref 150–400)
RBC: 2.9 MIL/uL — ABNORMAL LOW (ref 3.87–5.11)
RDW: 18.1 % — ABNORMAL HIGH (ref 11.5–15.5)
WBC: 1.9 10*3/uL — ABNORMAL LOW (ref 4.0–10.5)

## 2017-02-21 LAB — COMPREHENSIVE METABOLIC PANEL
ALK PHOS: 725 U/L — AB (ref 38–126)
ALT: 161 U/L — ABNORMAL HIGH (ref 14–54)
ANION GAP: 14 (ref 5–15)
AST: 660 U/L — ABNORMAL HIGH (ref 15–41)
Albumin: 1.7 g/dL — ABNORMAL LOW (ref 3.5–5.0)
BUN: 63 mg/dL — ABNORMAL HIGH (ref 6–20)
CALCIUM: 6.9 mg/dL — AB (ref 8.9–10.3)
CO2: 17 mmol/L — AB (ref 22–32)
CREATININE: 1.86 mg/dL — AB (ref 0.44–1.00)
Chloride: 104 mmol/L (ref 101–111)
GFR, EST AFRICAN AMERICAN: 31 mL/min — AB (ref >60)
GFR, EST NON AFRICAN AMERICAN: 27 mL/min — AB (ref >60)
Glucose, Bld: 109 mg/dL — ABNORMAL HIGH (ref 65–99)
Potassium: 4.1 mmol/L (ref 3.5–5.1)
SODIUM: 135 mmol/L (ref 135–145)
TOTAL PROTEIN: 4.8 g/dL — AB (ref 6.5–8.1)
Total Bilirubin: 9.4 mg/dL — ABNORMAL HIGH (ref 0.3–1.2)

## 2017-02-21 LAB — GLUCOSE, CAPILLARY
GLUCOSE-CAPILLARY: 93 mg/dL (ref 65–99)
GLUCOSE-CAPILLARY: 111 mg/dL — AB (ref 65–99)
GLUCOSE-CAPILLARY: 153 mg/dL — AB (ref 65–99)
Glucose-Capillary: 152 mg/dL — ABNORMAL HIGH (ref 65–99)

## 2017-02-21 LAB — MRSA PCR SCREENING: MRSA BY PCR: NEGATIVE

## 2017-02-21 LAB — PROCALCITONIN: Procalcitonin: 10.37 ng/mL

## 2017-02-21 LAB — LACTIC ACID, PLASMA
LACTIC ACID, VENOUS: 1.4 mmol/L (ref 0.5–1.9)
Lactic Acid, Venous: 1.6 mmol/L (ref 0.5–1.9)

## 2017-02-21 LAB — D-DIMER, QUANTITATIVE (NOT AT ARMC): D DIMER QUANT: 14.83 ug{FEU}/mL — AB (ref 0.00–0.50)

## 2017-02-21 MED ORDER — PROCHLORPERAZINE MALEATE 10 MG PO TABS: 10.0000 mg | ORAL_TABLET | Freq: Four times a day (QID) | ORAL | Status: DC | PRN

## 2017-02-21 MED ORDER — GABAPENTIN 300 MG PO CAPS
300.0000 mg | ORAL_CAPSULE | Freq: Three times a day (TID) | ORAL | Status: DC
  Administered 2017-02-22 (×2): 300 mg via ORAL
  Filled 2017-02-21 (×2): qty 1

## 2017-02-21 MED ORDER — DEXTROSE 5 % IV SOLN
2.0000 g | INTRAVENOUS | Status: DC
  Administered 2017-02-21 – 2017-02-22 (×2): 2 g via INTRAVENOUS
  Filled 2017-02-21 (×3): qty 2

## 2017-02-21 MED ORDER — LINEZOLID 600 MG/300ML IV SOLN
600.0000 mg | Freq: Two times a day (BID) | INTRAVENOUS | Status: DC
  Administered 2017-02-21 – 2017-02-22 (×4): 600 mg via INTRAVENOUS
  Filled 2017-02-21 (×5): qty 300

## 2017-02-21 MED ORDER — SODIUM CHLORIDE 0.9 % IV BOLUS (SEPSIS)
1000.0000 mL | Freq: Once | INTRAVENOUS | Status: AC
  Administered 2017-02-21: 1000 mL via INTRAVENOUS

## 2017-02-21 MED ORDER — DEXTROSE 5 % IV SOLN
10.0000 mg/h | INTRAVENOUS | Status: DC
  Administered 2017-02-21: 8 mg/h via INTRAVENOUS
  Administered 2017-02-22: 10 mg/h via INTRAVENOUS
  Filled 2017-02-21 (×2): qty 25

## 2017-02-21 MED ORDER — SODIUM CHLORIDE 0.9 % IV SOLN
INTRAVENOUS | Status: AC
  Administered 2017-02-21: 14:00:00 via INTRAVENOUS

## 2017-02-21 MED ORDER — VANCOMYCIN HCL 10 G IV SOLR
2500.0000 mg | Freq: Once | INTRAVENOUS | Status: DC
  Filled 2017-02-21: qty 2500

## 2017-02-21 MED ORDER — VANCOMYCIN HCL 10 G IV SOLR: 1500.0000 mg | INTRAVENOUS | Status: DC

## 2017-02-21 MED ORDER — INSULIN ASPART 100 UNIT/ML ~~LOC~~ SOLN
0.0000 [IU] | SUBCUTANEOUS | Status: DC
  Administered 2017-02-21 – 2017-02-22 (×3): 2 [IU] via SUBCUTANEOUS
  Administered 2017-02-22 (×2): 3 [IU] via SUBCUTANEOUS
  Administered 2017-02-22 (×2): 2 [IU] via SUBCUTANEOUS
  Administered 2017-02-23 (×2): 3 [IU] via SUBCUTANEOUS

## 2017-02-21 MED ORDER — NOREPINEPHRINE BITARTRATE 1 MG/ML IV SOLN
0.0000 ug/min | INTRAVENOUS | Status: DC
  Administered 2017-02-21: 2 ug/min via INTRAVENOUS
  Administered 2017-02-22: 8 ug/min via INTRAVENOUS
  Administered 2017-02-22: 6 ug/min via INTRAVENOUS
  Filled 2017-02-21 (×5): qty 4

## 2017-02-21 MED ORDER — ALBUMIN HUMAN 25 % IV SOLN
25.0000 g | Freq: Four times a day (QID) | INTRAVENOUS | Status: DC
  Administered 2017-02-21 – 2017-02-22 (×5): 25 g via INTRAVENOUS
  Filled 2017-02-21 (×5): qty 50
  Filled 2017-02-21: qty 100
  Filled 2017-02-21: qty 50
  Filled 2017-02-21: qty 100

## 2017-02-21 MED ORDER — MORPHINE SULFATE (PF) 4 MG/ML IV SOLN
4.0000 mg | INTRAVENOUS | Status: DC | PRN
  Administered 2017-02-21: 2 mg via INTRAVENOUS
  Administered 2017-02-21 – 2017-02-22 (×2): 4 mg via INTRAVENOUS
  Filled 2017-02-21 (×3): qty 1

## 2017-02-21 MED ORDER — RIVAROXABAN 20 MG PO TABS: 20.0000 mg | ORAL_TABLET | Freq: Every day | ORAL | Status: DC

## 2017-02-21 MED ORDER — THIAMINE HCL 100 MG/ML IJ SOLN
100.0000 mg | Freq: Once | INTRAMUSCULAR | Status: AC
  Administered 2017-02-21: 100 mg via INTRAVENOUS
  Filled 2017-02-21: qty 2

## 2017-02-21 NOTE — ED Triage Notes (Signed)
Patient presents by EMS with complaints of shortness of breath and edema to lower extremities-patient has metastatic breast cancer. Patient had chemo on Monday and states symptoms have worsened throughout the week. Breath sounds right upper wheezing and diminished throughout.

## 2017-02-21 NOTE — Plan of Care (Cosign Needed)
  Interdisciplinary Goals of Care Family Meeting   Date carried out:: 02/13/2017  Location of the meeting: Bedside  Member's involved: Physician, Nurse Practitioner, Family Member or next of kin and Other: and patient.   Durable Power of Tour manager: Patient and Daughter Tonya Alvarez)   Discussion: We discussed goals of care for Tonya Alvarez .  Dr. Nelda Marseille led discussion regarding goals of care.  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  She understands the entirety of the conversation.  She states she wishes for full / aggressive care up to 3 days including CPR if needed ("for one round"), intubation ("if push comes to shove") but she wants to hold out as long as possible, cardioversion & short term dialysis.  We reviewed plan of care and are hopeful to utilize bipap, levophed + albumin to maintain BP while diuresing the patient.  Follow up BMP at 0100 to review sr cr / electrolytes.    Code status: Full Code  Disposition: Continue current acute care  Time spent for the meeting: 30 minutes.  Dr. Nelda Marseille present for entire discussion.     Noe Gens, NP-C [facility] Pgr: [phone] or if no answer [phone] 02/09/2017, 3:52 PM

## 2017-02-21 NOTE — ED Provider Notes (Addendum)
[facility] DEPT Provider Note   CSN: [id number] Arrival date & time: 02/04/2017  0608     History   Chief Complaint Chief Complaint  Patient presents with  . Shortness of Breath    HPI Tonya Alvarez is a 71 y.o. female.  HPI Complains of shortness of breath progressively worsening onset 3 days ago. Also complains of diffuse abdominal pain and feels like she has increased ascites in her abdomen. She feels dyspneic secondary to "pressure in my diaphragm". Denies other associated symptoms.no fever. No cough.no treatment prior to coming here. Denies noncompliance with medications.other associated symptoms include diarrhea 3 episodes yesterday. She's had diarrhea for 2 days. Nonbloody. No recent travel. No recent antibiotics Past Medical History:  Diagnosis Date  . Arthritis    "knees, ankles" (09/22/2014  . Breast cancer ([place]) 02/04/14   right breast  . Cancer of right breast ([place])   . Chronic lower back pain   . Complication of anesthesia    O2 SAT  DROP   . DDD (degenerative disc disease), cervical   . DDD (degenerative disc disease), lumbosacral   . DDD (degenerative disc disease), thoracolumbar   . Diabetes mellitus without complication ([place])   . Diabetic peripheral neuropathy ([place])   . Heart murmur    mitral valve  . Hyperlipidemia   . Hypertension   . Mitral regurgitation   . Neuromuscular disorder ([place])   . Sinus headache    "seasonal"  . Type II diabetes mellitus [facility])     Patient Active Problem List   Diagnosis Date Noted  . Goals of care, counseling/discussion 02/04/2017  . Genetic testing 11/15/2016  . Pulmonary embolism ([place]) 10/22/2015  . PE (pulmonary embolism) 10/22/2015  . Diabetes mellitus without complication ([place])   . Port catheter in place 09/09/2015  . Bone metastases ([place]) 04/12/2015  . Hyperkalemia 04/02/2014  . Hypertension 04/02/2014  . Breast cancer of upper-outer quadrant of right female breast  ([place]) 02/09/2014    Past Surgical History:  Procedure Laterality Date  . BREAST BIOPSY Right 02/2014  . JOINT REPLACEMENT    . MASTECTOMY COMPLETE / SIMPLE Left 09/22/2014  . MASTECTOMY MODIFIED RADICAL Right 09/22/2014  . MASTECTOMY MODIFIED RADICAL Right 09/22/2014   Procedure:  RIGHT MASTECTOMY MODIFIED RADICAL;  Surgeon: Coralie Keens, MD;  Location: [facility];  Service: General;  Laterality: Right;  . PORTACATH PLACEMENT Right 02/23/2014   power port, tip cavo-atrial junction  . SIMPLE MASTECTOMY WITH AXILLARY SENTINEL NODE BIOPSY Left 09/22/2014   Procedure: LEFT SIMPLE MASTECTOMY;  Surgeon: Coralie Keens, MD;  Location: [facility];  Service: General;  Laterality: Left;  . TONSILLECTOMY  1955  . TOTAL KNEE ARTHROPLASTY Left ~ 2009  . TUBAL LIGATION  1980    OB History    No data available       Home Medications    Prior to Admission medications   Medication Sig Start Date End Date Taking? Authorizing Provider  calcium-vitamin D (OSCAL WITH D) 500-200 MG-UNIT per tablet Take 1 tablet by mouth daily.    [provider]  clindamycin (CLEOCIN) 300 MG capsule Take 900 mg by mouth See admin instructions. Take 3 capsules (900 mg) by mouth one hour prior to dental appointment - last appointment October 11, 2015    [provider]  doxycycline (VIBRA-TABS) 100 MG tablet Take 1 tablet (100 mg total) by mouth 2 (two) times daily. Patient not taking: Reported on 02/06/2017 01/09/17  Gardenia Phlegm, NP  fluticasone (FLONASE) 50 MCG/ACT nasal spray Place 1 spray into both nostrils daily as needed for allergies or rhinitis. Patient taking differently: Place 1 spray into both nostrils at bedtime as needed for allergies or rhinitis.  07/26/14   Nicholas Lose, MD  furosemide (LASIX) 40 MG tablet TAKE 1 TABLET BY MOUTH  DAILY Patient taking differently: TAKE 0.5 TABLET BY MOUTH  DAILY 01/22/17   Nicholas Lose, MD  gabapentin (NEURONTIN) 300 MG capsule Take 300 mg by mouth 3  (three) times daily.     [provider]  ibuprofen (ADVIL,MOTRIN) 600 MG tablet Take 600 mg by mouth every 12 (twelve) hours.    [provider]  insulin lispro protamine-lispro (HUMALOG 75/25 MIX) (75-25) 100 UNIT/ML SUSP injection Inject 10-60 Units into the skin See admin instructions. Inject 60 units subcutaneously with breakfast and supper, inject 10 mls after lunch if CBG >150    [provider]  lidocaine-prilocaine (EMLA) cream Apply 1 application topically as needed. Patient not taking: Reported on 01/09/2017 04/09/14   Laurie Panda, NP  lidocaine-prilocaine (EMLA) cream Apply to affected area once Patient not taking: Reported on 02/06/2017 02/04/17   Nicholas Lose, MD  Lin[facility] PAIN RELIEF PATCH EX) Place 1 patch onto the skin daily as needed (PAIN).    [provider]  metFORMIN (GLUCOPHAGE-XR) 500 MG 24 hr tablet Take 2 tablets (1,000 mg total) by mouth 2 (two) times daily. Patient not taking: Reported on 01/09/2017 10/31/15   Hosie Poisson, MD  Multiple Vitamin (MULTIVITAMIN WITH MINERALS) TABS tablet Take 1 tablet by mouth at bedtime.    [provider]  ondansetron (ZOFRAN) 8 MG tablet Take 1 tablet (8 mg total) by mouth 2 (two) times daily as needed (Nausea or vomiting). Patient not taking: Reported on 02/06/2017 02/04/17   Nicholas Lose, MD  prochlorperazine (COMPAZINE) 10 MG tablet Take 1 tablet (10 mg total) by mouth every 6 (six) hours as needed (Nausea or vomiting). 02/04/17   Nicholas Lose, MD  rivaroxaban (XARELTO) 20 MG TABS tablet TAKE 1 TABLET BY MOUTH  DAILY WITH SUPPER 10/30/16   Nicholas Lose, MD    Family History Family History  Problem Relation Age of Onset  . Cancer Father        lung cancer  . Cancer Maternal Aunt        Kidney  . Hypertension Maternal Uncle     Social History Social History  Substance Use Topics  . Smoking status: Former Smoker    Packs/day: 0.75    Years: 30.00    Types: Cigarettes     Quit date: 05/07/2013  . Smokeless tobacco: Never Used  . Alcohol use No     Comment: 09/22/2014 "I'll drink a few times/yr"     Allergies   Amoxicillin; Hydrocodone; and Lisinopril   Review of Systems Review of Systems  Constitutional: Positive for appetite change.       Appetite diminished over the past 2-3 days  HENT: Negative.   Respiratory: Positive for shortness of breath.   Cardiovascular: Positive for leg swelling.  Gastrointestinal: Positive for abdominal distention, abdominal pain and diarrhea.       Diarrhea for the past 2 days nonbloody 3  Musculoskeletal: Negative.   Skin: Negative.   Allergic/Immunologic: Positive for immunocompromised state.  Neurological: Positive for light-headedness.       Lightheadedness with standing  Psychiatric/Behavioral: Negative.   All other systems reviewed and are negative.    Physical  Exam Updated Vital Signs BP 104/71 (BP Location: Left Arm)   Pulse 86   Temp (!) 97.4 F (36.3 C) (Oral)   Resp (!) 22   SpO2 97%   Physical Exam  Constitutional:  Chronically ill-appearing  HENT:  Head: Normocephalic and atraumatic.  Eyes: Pupils are equal, round, and reactive to light. Conjunctivae are normal.  Neck: Neck supple. No tracheal deviation present. No thyromegaly present.  Cardiovascular: Normal rate and regular rhythm.   No murmur heard. Pulmonary/Chest: Effort normal and breath sounds normal.  Port-A-Cath in place at right anterior chest. Site is not warm or tender. Shallow breath sounds diffusely.  Abdominal: Soft. Bowel sounds are normal. She exhibits no distension. There is no tenderness.  Morbidly obese  Musculoskeletal: Normal range of motion. She exhibits edema. She exhibits no tenderness.  4+ pretibial pitting edema bilaterally  Neurological: She is alert. Coordination normal.  Skin: Skin is warm and dry. No rash noted.  Psychiatric: She has a normal mood and affect.  Nursing note and vitals reviewed.    ED  Treatments / Results  Labs (all labs ordered are listed, but only abnormal results are displayed) Labs Reviewed  CBC WITH DIFFERENTIAL/PLATELET - Abnormal; Notable for the following:       Result Value   WBC 1.9 (*)    RBC 2.90 (*)    Hemoglobin 7.9 (*)    HCT 24.0 (*)    RDW 18.1 (*)    Platelets 60 (*)    Lymphs Abs 0.1 (*)    Monocytes Absolute 0.0 (*)    All other components within normal limits  COMPREHENSIVE METABOLIC PANEL - Abnormal; Notable for the following:    CO2 17 (*)    Glucose, Bld 109 (*)    BUN 63 (*)    Creatinine, Ser 1.86 (*)    Calcium 6.9 (*)    Total Protein 4.8 (*)    Albumin 1.7 (*)    AST 660 (*)    ALT 161 (*)    Alkaline Phosphatase 725 (*)    Total Bilirubin 9.4 (*)    GFR calc non Af Amer 27 (*)    GFR calc Af Amer 31 (*)    All other components within normal limits  D-DIMER, QUANTITATIVE (NOT AT [facility]) - Abnormal; Notable for the following:    D-Dimer, Quant 14.83 (*)    All other components within normal limits    EKG  EKG Interpretation  Date/Time:  Thursday February 21 2017 06:19:55 EDT Ventricular Rate:  88 PR Interval:    QRS Duration: 100 QT Interval:  406 QTC Calculation: 492 R Axis:   80 Text Interpretation:  Duplicate Delete Confirmed by Shanon Rosser [phone]) on 02/20/2017 6:27:00 AM Also confirmed by Shanon Rosser [phone]), editor Hattie Perch ([phone])  on 02/15/2017 8:03:30 AM      Chest x-ray viewed by me Radiology Dg Chest 2 View  Result Date: 03/05/2017 CLINICAL DATA:  Worsening shortness of breath, weakness, and wheezing over the last week. History of metastatic breast cancer. EXAM: CHEST  2 VIEW COMPARISON:  02/06/2017 FINDINGS: Shallow inspiration. Heart size and pulmonary vascularity are normal for inspiratory effort. No consolidation or airspace disease. No blunting of costophrenic angles. No pneumothorax. Right central venous catheter with tip over the cavoatrial junction region. Surgical clips in the right  axilla. IMPRESSION: Shallow inspiration.  No evidence of active pulmonary disease. Electronically Signed   By: Lucienne Capers M.D.   On: 02/20/2017 06:43    Procedures  Procedures (including critical care time)  Medications Ordered in ED Medications - No data to display   Initial Impression / Assessment and Plan / ED Course  I have reviewed the triage vital signs and the nursing notes.  Pertinent labs & imaging results that were available during my care of the patient were reviewed by me and considered in my medical decision making (see chart for details).     IV normal saline bolus ordered as patient mildly hypotensive. Clinically dehydrated Patient requesting palliative care consult. Ordered by me.she is full code presently. I consulted Dr. Shanon Brow from hospital service, who will arrange for overnight stayPatient will likely need paracentesis Pulse oximetry on room air consistent with hypoxia. Patient placed on supplemental oxygen. Liver function tests aggressively worsening and I suspect that she is going to liver failure. She also has acute on chronic renal insufficiency..she also has worsening anemia and thrombocytopenia over 3 days ago.in light of elevated d-dimer ordered at triage, ventilation perfusion scan ordered Final Clinical Impressions(s) / ED Diagnoses  Diagnosis #1 dyspnea #2 hypoxia #3 hepatic insufficiency #4 renal insufficiency #5 thrombocytopenia #6Anemia Final diagnoses:  None   CRITICAL CARE Performed by: Orlie Dakin Total critical care time: 30 minutes Critical care time was exclusive of separately billable procedures and treating other patients. Critical care was necessary to treat or prevent imminent or life-threatening deterioration. Critical care was time spent personally by me on the following activities: development of treatment plan with patient and/or surrogate as well as nursing, discussions with consultants, evaluation of patient's response to  treatment, examination of patient, obtaining history from patient or surrogate, ordering and performing treatments and interventions, ordering and review of laboratory studies, ordering and review of radiographic studies, pulse oximetry and re-evaluation of patient's condition. New Prescriptions New Prescriptions   No medications on file     Orlie Dakin, MD 02/20/2017 [id number]    Orlie Dakin, MD 03/03/2017 1015 ADDENDUM: PATIENT WILL BE ADMITTED TO INTENSIVE CARE UNIT. CRITICAL CARE TEAM WAS CONSULTED BY ME AND THEY WILL FOLLOW PATIENT ALONG WITH Dr Lyla Glassing, Mana Morison, MD 02/22/2017 1017

## 2017-02-21 NOTE — ED Notes (Signed)
ED Provider at bedside. 

## 2017-02-21 NOTE — Consult Note (Signed)
PULMONARY / CRITICAL CARE MEDICINE   Name: Tonya Alvarez MRN: [id number] DOB: 05-17-1945    ADMISSION DATE:  02/09/2017 CONSULTATION DATE:  02/06/2017  REFERRING MD:  Dr. Shanon Brow   CHIEF COMPLAINT:  SOB   HISTORY OF PRESENT ILLNESS:   71 y/o F who presented to [facility] on 10/18 with increasing shortness of breath.    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  She was initially diagnosed in 2015.  The patient was seen on 10/15 by her Oncologist (Dr. Lindi Adie).  The week prior to that visit she required 4 units of PRBC's for anemia.  Her labs on 10/15 were notable for CO2 21, Alk Phos 851, albumin 1.7, AST 389, ALT 139.  Dr. Geralyn Flash Notes reflect that he was treating through her elevated LFTs but was considering discontinuing and recommending hospice.  She presented to [facility] ER on 10/18 with worsening shortness of breath.  Symptoms increased on 10/16.  She reports difficulty taking a deep breath as it hurts her abdomen.  In addition to SOB she had increased diffuse swelling.  She reports swelling has increased over the past week.  The patient states she ordinarily does not have pain. She denies fevers, chills, nausea, vomiting. No cough or sputum production.  She has had some diarrhea The patient was noted to be hypotensive on presentation to the ER.she was given 1 L normal saline with improvement in blood pressure.  Chest x-ray was negative for acute infiltrate.  Initial labs-NA 135, K4.1, chloride 104, CO2 17, glucose 109, BUN 63, creatinine 1.86, anion gap 14, alkaline phosphatase 725, albumin 1.7, AST 660, ALT 161, WBC 1.9, hemoglobin and platelets 60.    PCCM consulted for evaluation of hypotension.   PAST MEDICAL HISTORY :  She  has a past medical history of Arthritis; Breast cancer ([place]) (02/04/14); Cancer of right breast ([place]); Chronic lower back  pain; Complication of anesthesia; DDD (degenerative disc disease), cervical; DDD (degenerative disc disease), lumbosacral; DDD (degenerative disc disease), thoracolumbar; Diabetes mellitus without complication ([place]); Diabetic peripheral neuropathy ([place]); Heart murmur; Hyperlipidemia; Hypertension; Mitral regurgitation; Neuromuscular disorder ([place]); Sinus headache; and Type II diabetes mellitus ([place]).  PAST SURGICAL HISTORY: She  has a past surgical history that includes Portacath placement (Right, 02/23/2014); Mastectomy complete / simple (Left, 09/22/2014); Mastectomy modified radical (Right, 09/22/2014); Tonsillectomy (1955); Total knee arthroplasty (Left, ~ 2009); Joint replacement; Breast biopsy (Right, 02/2014); Tubal ligation (1980); Mastectomy modified radical (Right, 09/22/2014); and Simple mastectomy with axillary sentinel node biopsy (Left, 09/22/2014).  Allergies  Allergen Reactions  . Amoxicillin Nausea And Vomiting    Has patient had a PCN reaction causing immediate rash, facial/tongue/throat swelling, SOB or lightheadedness with hypotension: Unknown Has patient had a PCN reaction causing severe rash involving mucus membranes or skin necrosis: No Has patient had a PCN reaction that required hospitalization: No Has patient had a PCN reaction occurring within the last 10 years: Yes If all of the above answers are "NO", then may proceed with Cephalosporin use.   Marland Kitchen Hydrocodone Itching  . Lisinopril Cough    No current facility-administered medications on file prior to encounter.    Current Outpatient Prescriptions on File Prior to Encounter  Medication Sig  . calcium-vitamin D (OSCAL WITH D) 500-200 MG-UNIT per tablet Take 1 tablet by mouth daily.  . clindamycin (CLEOCIN) 300 MG capsule Take 900 mg by mouth See admin  instructions. Take 3 capsules (900 mg) by mouth one hour prior to dental appointment  . fluticasone (FLONASE) 50 MCG/ACT nasal spray Place 1 spray into both nostrils daily  as needed for allergies or rhinitis.  . furosemide (LASIX) 40 MG tablet TAKE 1 TABLET BY MOUTH  DAILY (Patient taking differently: TAKE 0.5 TABLET BY MOUTH  DAILY)  . gabapentin (NEURONTIN) 300 MG capsule Take 300 mg by mouth 3 (three) times daily.   . insulin lispro protamine-lispro (HUMALOG 75/25 MIX) (75-25) 100 UNIT/ML SUSP injection Inject 40-65 Units into the skin 2 (two) times daily with a meal. Sliding Scale  . Liniments (SALONPAS PAIN RELIEF PATCH EX) Place 1 patch onto the skin daily as needed (PAIN).  . Multiple Vitamin (MULTIVITAMIN WITH MINERALS) TABS tablet Take 1 tablet by mouth every evening.   . prochlorperazine (COMPAZINE) 10 MG tablet Take 1 tablet (10 mg total) by mouth every 6 (six) hours as needed (Nausea or vomiting).  . rivaroxaban (XARELTO) 20 MG TABS tablet TAKE 1 TABLET BY MOUTH  DAILY WITH SUPPER  . doxycycline (VIBRA-TABS) 100 MG tablet Take 1 tablet (100 mg total) by mouth 2 (two) times daily. (Patient not taking: Reported on 02/06/2017)  . metFORMIN (GLUCOPHAGE-XR) 500 MG 24 hr tablet Take 2 tablets (1,000 mg total) by mouth 2 (two) times daily. (Patient not taking: Reported on 01/09/2017)  . ondansetron (ZOFRAN) 8 MG tablet Take 1 tablet (8 mg total) by mouth 2 (two) times daily as needed (Nausea or vomiting). (Patient not taking: Reported on 02/06/2017)    FAMILY HISTORY:  Her indicated that her mother is deceased. She indicated that her father is deceased. She indicated that the status of her maternal aunt is unknown. She indicated that the status of her maternal uncle is unknown.    SOCIAL HISTORY: She  reports that she quit smoking about 3 years ago. Her smoking use included Cigarettes. She has a 22.50 pack-year smoking history. She has never used smokeless tobacco. She reports that she does not drink alcohol or use drugs.  REVIEW OF SYSTEMS:  POSITIVES IN BOLD Gen: Denies fever, chills, weight change, fatigue, night sweats HEENT: Denies blurred vision, double  vision, hearing loss, tinnitus, sinus congestion, rhinorrhea, sore throat, neck stiffness, dysphagia PULM: Denies shortness of breath, cough, sputum production, hemoptysis, wheezing CV: Denies chest pain, edema, orthopnea, paroxysmal nocturnal dyspnea, palpitations GI: Denies abdominal pain with a deep breath, nausea, vomiting, diarrhea, hematochezia, melena, constipation, change in bowel habits GU: Denies dysuria, hematuria, polyuria, oliguria, urethral discharge Endocrine: Denies hot or cold intolerance, polyuria, polyphagia or appetite change Derm: Denies rash, dry skin, scaling or peeling skin change Heme: Denies easy bruising, bleeding, bleeding gums Neuro: Denies headache, numbness, weakness, slurred speech, loss of memory or consciousness   SUBJECTIVE:   VITAL SIGNS: BP (!) 100/57   Pulse 85   Temp (!) 97.4 F (36.3 C) (Oral)   Resp 17   Ht _0  (1.753 m)   Wt (!) 314 lb 9.5 oz (142.7 kg)   SpO2 99%   BMI 46.46 kg/m   HEMODYNAMICS:    VENTILATOR SETTINGS:    INTAKE / OUTPUT: No intake/output data recorded.  PHYSICAL EXAMINATION: General:  Chronically ill-appearing female HEENT: MM pink/dry PSY: calm / appropriate Neuro: AAO 4, speech clear CV: s1s2 rrr, no m/r/g PULM: even/non-labored, lungs bilaterally diminished GI: protuberant, pitting edema, non-tender, bowel sounds 4 hypoactive Extremities: warm/dry, generalized anasarca  Skin: no rashes or lesions  LABS:  BMET  Recent Labs Lab 02/15/17  1033 02/18/17 1433 02/13/2017 0640  NA 140 138 135  K 3.7 4.2 4.1  CL  --   --  104  CO2 22 21* 17*  BUN 22.5 34.8* 63*  CREATININE 0.9 1.4* 1.86*  GLUCOSE 115 283* 109*    Electrolytes  Recent Labs Lab 02/15/17 1033 02/18/17 1433 02/08/2017 0640  CALCIUM 8.2* 7.9* 6.9*    CBC  Recent Labs Lab 02/15/17 1033 02/18/17 1433 02/22/2017 0640  WBC 6.9 2.0* 1.9*  HGB 7.2* 9.0* 7.9*  HCT 23.4* 28.1* 24.0*  PLT 235 133* 60*    Coag's No results for  input(s): APTT, INR in the last 168 hours.  Sepsis Markers No results for input(s): LATICACIDVEN, PROCALCITON, O2SATVEN in the last 168 hours.  ABG No results for input(s): PHART, PCO2ART, PO2ART in the last 168 hours.  Liver Enzymes  Recent Labs Lab 02/15/17 1033 02/18/17 1433 03/02/2017 0640  AST 301* 389* 660*  ALT 100* 139* 161*  ALKPHOS 744* 851* 725*  BILITOT 4.15* 6.68* 9.4*  ALBUMIN 1.8* 1.7* 1.7*    Cardiac Enzymes No results for input(s): TROPONINI, PROBNP in the last 168 hours.  Glucose No results for input(s): GLUCAP in the last 168 hours.  Imaging Dg Chest 2 View  Result Date: 03/01/2017 CLINICAL DATA:  Worsening shortness of breath, weakness, and wheezing over the last week. History of metastatic breast cancer. EXAM: CHEST  2 VIEW COMPARISON:  02/06/2017 FINDINGS: Shallow inspiration. Heart size and pulmonary vascularity are normal for inspiratory effort. No consolidation or airspace disease. No blunting of costophrenic angles. No pneumothorax. Right central venous catheter with tip over the cavoatrial junction region. Surgical clips in the right axilla. IMPRESSION: Shallow inspiration.  No evidence of active pulmonary disease. Electronically Signed   By: Lucienne Capers M.D.   On: 02/06/2017 06:43     STUDIES:    CULTURES:   ANTIBIOTICS: Cefepime 10/18 >>  Linezolid 10/18 >>    SIGNIFICANT EVENTS: 10/18  Admit with SOB  LINES/TUBES: R CW Port 10/18 >>   DISCUSSION: 71 y/o F with stage IV metastatic breast cancer admitted with hypotension, AKI, worsening LFT's, dyspnea and anasarca.  Concern for progression of malignancy vs new infectious process.   ASSESSMENT / PLAN:  PULMONARY A: Acute Respiratory Distress - in setting of metastatic breast cancer  Elevated D-Dimer, Hx of PE  P:   O2 as needed to support sats >90% Patient wants to be intubated no more than 2 days If intubated, she would not come off  Discontinue VQ study > pt would not  tolerate.   Continue xarelto  CARDIOVASCULAR A:  Shock - volume depletion vs infectious etiology vs malignant progression  Hx HTN, HLD, MR P:  ICU monitoring  Begin levophed for MAP > 65, hopeful this will help with AKI   RENAL A:   AKI  AGMA P:   Trend BMP / urinary output Replace electrolytes as indicated Avoid nephrotoxic agents, ensure adequate renal perfusion  GASTROINTESTINAL A:   Liver Metastasis  P:   Clear liquid diet as tolerated    HEMATOLOGIC / ONC  A:   Chronic Anticoagulation - in setting of prior PE  Stage IV Breast Cancer Anemia - recent transfusion of 4 units PRBC's P:  Trend CBC  Continue xarelto  Monitor closely for bleeding   INFECTIOUS A:   Shock - rule out sepsis / infectious etiology  P:   Assess cultures as above  Empiric abx  Avoid vanco in setting of AKI Linezolid  for MRSA coverage   ENDOCRINE A:   DM    P:   SSI  NEUROLOGIC A:   Cancer Associated Pain  P:   RASS goal: n/a  Minimize sedating medications as able    FAMILY  - Updates: Patient updated at bedside.    - Inter-disciplinary family meet or Palliative Care meeting due by:  Ongoing.  Spoke with patient about goals of care. We are going to look for any reversible process and support her wishes.  She would like to be on pressors if needed and would accept mechanical ventilation up to 48 hours.  Her daughter Colletta [place]) would be her decision maker and she is aware of her mothers wishes.    Noe Gens, NP-C [facility] Pgr: [phone] or if no answer [phone] 02/19/2017, 10:50 AM  Attending Note:  71 year old female with stage 4 breast cancer with mets to the bone, lung and liver who presents with anasarca, renal failure, septic shock and respiratory failure.  On exam, anasarca diffusely and diffuse crackles on lung exam.  I reviewed CXR myself, pulmonary edema noted.  Patient is in shock and respiratory failure and would like to try intubation  and full code for 48 hours.  If unable to improve then comfort.  Will need placement of a central line and intubation for support but would like to wait for daughter to arrive in case we are unable to extubate she would like to see her daughter last time.  Will come back and discuss further upon arrival of daughter.  The patient is critically ill with multiple organ systems failure and requires high complexity decision making for assessment and support, frequent evaluation and titration of therapies, application of advanced monitoring technologies and extensive interpretation of multiple databases.   Critical Care Time devoted to patient care services described in this note is  35  Minutes. This time reflects time of care of this signee Dr Jennet Maduro. This critical care time does not reflect procedure time, or teaching time or supervisory time of PA/NP/Med student/Med Resident etc but could involve care discussion time.  Rush Farmer, M.D. [facility]. Pager: [phone]. After hours pager: [phone].

## 2017-02-21 NOTE — H&P (Signed)
History and Physical    Tonya Alvarez WFU:[id number] DOB: 10-21-1945 DOA: 02/13/2017  PCP: Jacelyn Pi, MD  Patient coming from:  home  Chief Complaint:   sob  HPI: Tonya Alvarez is a 71 y.o. female with medical history significant of stage 4 breast cancer for the last 3 years comes in with worsening sob and swelling all over.  She just started palliative chemo again and just got her second round the last couple of days.  She has been declining for months.  She denies fever, no cough.  She has increased swelling in her abdomen and legs.  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  She denies any n/v/d.  She is very sob due to all the anasarca she has.  She has a h/o PE in 2017 for which she is on xaralto.  For some reason and ddimer was checked by ED nursing and it is over 15.  Pt has worsening renal and liver failure.  She is being referred for admission for work up of her elevated DDimer, and worsening overall status.  She wishes to have a paracentesis to help with her discomfort with breathing.  Since arrival to ED, she has become hypotensive with sbp dropping in the mid 70s.  She is currently getting a liter ivf bolus and is very sob.   Review of Systems: As per HPI otherwise 10 point review of systems negative.   Past Medical History:  Diagnosis Date  . Arthritis    "knees, ankles" (09/22/2014  . Breast cancer ([place]) 02/04/14   right breast  . Cancer of right breast ([place])   . Chronic lower back pain   . Complication of anesthesia    O2 SAT  DROP   . DDD (degenerative disc disease), cervical   . DDD (degenerative disc disease), lumbosacral   . DDD (degenerative disc disease), thoracolumbar   . Diabetes mellitus without complication ([place])   . Diabetic peripheral neuropathy ([place])   . Heart murmur    mitral valve  . Hyperlipidemia   . Hypertension   . Mitral regurgitation   . Neuromuscular disorder ([place])   . Sinus headache    "seasonal"  . Type II diabetes mellitus ([place])      Past Surgical History:  Procedure Laterality Date  . BREAST BIOPSY Right 02/2014  . JOINT REPLACEMENT    . MASTECTOMY COMPLETE / SIMPLE Left 09/22/2014  . MASTECTOMY MODIFIED RADICAL Right 09/22/2014  . MASTECTOMY MODIFIED RADICAL Right 09/22/2014   Procedure:  RIGHT MASTECTOMY MODIFIED RADICAL;  Surgeon: Coralie Keens, MD;  Location: [facility];  Service: General;  Laterality: Right;  . PORTACATH PLACEMENT Right 02/23/2014   power port, tip cavo-atrial junction  . SIMPLE MASTECTOMY WITH AXILLARY SENTINEL NODE BIOPSY Left 09/22/2014   Procedure: LEFT SIMPLE MASTECTOMY;  Surgeon: Coralie Keens, MD;  Location: [facility];  Service: General;  Laterality: Left;  . TONSILLECTOMY  1955  . TOTAL KNEE ARTHROPLASTY Left ~ 2009  . TUBAL LIGATION  1980     reports that she quit smoking about 3 years ago. Her smoking use included Cigarettes. She has a 22.50 pack-year smoking history. She has never used smokeless tobacco. She reports that she does not drink alcohol or use drugs.  Allergies  Allergen Reactions  . Amoxicillin Nausea And Vomiting    Has patient had a PCN reaction causing immediate rash, facial/tongue/throat swelling, SOB or lightheadedness with hypotension: Unknown Has patient had a PCN reaction causing severe rash involving mucus  membranes or skin necrosis: No Has patient had a PCN reaction that required hospitalization: No Has patient had a PCN reaction occurring within the last 10 years: Yes If all of the above answers are "NO", then may proceed with Cephalosporin use.   Marland Kitchen Hydrocodone Itching  . Lisinopril Cough    Family History  Problem Relation Age of Onset  . Cancer Father        lung cancer  . Cancer Maternal Aunt        Kidney  . Hypertension Maternal Uncle     Prior to Admission medications   Medication Sig Start Date End Date Taking? Authorizing Provider  calcium-vitamin D (OSCAL WITH D) 500-200 MG-UNIT per tablet Take 1 tablet by mouth daily.   Yes [provider]  clindamycin (CLEOCIN) 300 MG capsule Take 900 mg by mouth See admin instructions. Take 3 capsules (900 mg) by mouth one hour prior to dental appointment   Yes [provider]  fluticasone (FLONASE) 50 MCG/ACT nasal spray Place 1 spray into both nostrils daily as needed for allergies or rhinitis. 07/26/14  Yes Nicholas Lose, MD  furosemide (LASIX) 40 MG tablet TAKE 1 TABLET BY MOUTH  DAILY Patient taking differently: TAKE 0.5 TABLET BY MOUTH  DAILY 01/22/17  Yes Nicholas Lose, MD  gabapentin (NEURONTIN) 300 MG capsule Take 300 mg by mouth 3 (three) times daily.    Yes [provider]  ibuprofen (ADVIL,MOTRIN) 200 MG tablet Take 600 mg by mouth 2 (two) times daily.   Yes [provider]  insulin lispro protamine-lispro (HUMALOG 75/25 MIX) (75-25) 100 UNIT/ML SUSP injection Inject 40-65 Units into the skin 2 (two) times daily with a meal. Sliding Scale   Yes [provider]  Liniments (SALONPAS PAIN RELIEF PATCH EX) Place 1 patch onto the skin daily as needed (PAIN).   Yes [provider]  Multiple Vitamin (MULTIVITAMIN WITH MINERALS) TABS tablet Take 1 tablet by mouth every evening.    Yes [provider]  prochlorperazine (COMPAZINE) 10 MG tablet Take 1 tablet (10 mg total) by mouth every 6 (six) hours as needed (Nausea or vomiting). 02/04/17  Yes Nicholas Lose, MD  rivaroxaban (XARELTO) 20 MG TABS tablet TAKE 1 TABLET BY MOUTH  DAILY WITH SUPPER 10/30/16  Yes Nicholas Lose, MD  tiZANidine (ZANAFLEX) 4 MG tablet Take 4 mg by mouth at bedtime as needed for muscle spasms.    Yes [provider]  doxycycline (VIBRA-TABS) 100 MG tablet Take 1 tablet (100 mg total) by mouth 2 (two) times daily. Patient not taking: Reported on 02/06/2017 01/09/17   Gardenia Phlegm, NP  metFORMIN (GLUCOPHAGE-XR) 500 MG 24 hr tablet Take 2 tablets (1,000 mg total) by mouth 2 (two) times daily. Patient not taking: Reported on 01/09/2017 10/31/15    Hosie Poisson, MD  ondansetron (ZOFRAN) 8 MG tablet Take 1 tablet (8 mg total) by mouth 2 (two) times daily as needed (Nausea or vomiting). Patient not taking: Reported on 02/06/2017 02/04/17   Nicholas Lose, MD    Physical Exam: Vitals:   02/18/2017 0920 02/06/2017 0922 03/05/2017 0944 02/27/2017 1000  BP: (!) 94/58 96/79 98/60  (!) 100/57  Pulse: 83 82 85 85  Resp: (!) 26 15 (!) 26 17  Temp:      TempSrc:      SpO2:      Weight:      Height:          Constitutional:  Moderate respiratory distress.  Jaundiced.  Anasarca moderate Vitals:   02/26/2017 0920 02/25/2017 0922 02/14/2017 0944 03/02/2017 1000  BP: (!) 94/58 96/79 98/60  (!) 100/57  Pulse: 83 82 85 85  Resp: (!) 26 15 (!) 26 17  Temp:      TempSrc:      SpO2:      Weight:      Height:       Eyes: PERRL, lids and conjunctivae normal ENMT: Mucous membranes are moist. Posterior pharynx clear of any exudate or lesions.Normal dentition.  Neck: normal, supple, no masses, no thyromegaly Respiratory: clear to auscultation bilaterally, no wheezing, no crackles. increased respiratory effort. No accessory muscle use.  Cardiovascular: Regular rate and rhythm, no murmurs / rubs / gallops. 4+ extremity edema. 2+ pedal pulses. No carotid bruits.  Abdomen: no tenderness, no masses palpated. No hepatosplenomegaly. Bowel sounds positive.  Musculoskeletal: no clubbing / cyanosis. No joint deformity upper and lower extremities. Good ROM, no contractures. Normal muscle tone.  Skin: no rashes, lesions, ulcers. No induration Neurologic: CN 2-12 grossly intact. Sensation intact, DTR normal. Strength 5/5 in all 4.  Psychiatric: Normal judgment and insight. Alert and oriented x 3. Normal mood.    Labs on Admission: I have personally reviewed following labs and imaging studies  CBC:  Recent Labs Lab 02/15/17 1033 02/18/17 1433 03/03/2017 0640  WBC 6.9 2.0* 1.9*  NEUTROABS 6.2 1.2* 1.8  HGB 7.2* 9.0* 7.9*  HCT 23.4* 28.1* 24.0*  MCV 84.2 84.4 82.8    PLT 235 133* 60*   Basic Metabolic Panel:  Recent Labs Lab 02/15/17 1033 02/18/17 1433 02/13/2017 0640  NA 140 138 135  K 3.7 4.2 4.1  CL  --   --  104  CO2 22 21* 17*  GLUCOSE 115 283* 109*  BUN 22.5 34.8* 63*  CREATININE 0.9 1.4* 1.86*  CALCIUM 8.2* 7.9* 6.9*   GFR: Estimated Creatinine Clearance: 43.6 mL/min (A) (by C-G formula based on SCr of 1.86 mg/dL (H)). Liver Function Tests:  Recent Labs Lab 02/15/17 1033 02/18/17 1433 02/20/2017 0640  AST 301* 389* 660*  ALT 100* 139* 161*  ALKPHOS 744* 851* 725*  BILITOT 4.15* 6.68* 9.4*  PROT 5.3* 5.3* 4.8*  ALBUMIN 1.8* 1.7* 1.7*    Recent Results (from the past 240 hour(s))  TECHNOLOGIST REVIEW     Status: None   Collection Time: 02/18/17  2:33 PM  Result Value Ref Range Status   Technologist Review Occ Metas and Myelocytes present  Final     Radiological Exams on Admission: Dg Chest 2 View  Result Date: 02/20/2017 CLINICAL DATA:  Worsening shortness of breath, weakness, and wheezing over the last week. History of metastatic breast cancer. EXAM: CHEST  2 VIEW COMPARISON:  02/06/2017 FINDINGS: Shallow inspiration. Heart size and pulmonary vascularity are normal for inspiratory effort. No consolidation or airspace disease. No blunting of costophrenic angles. No pneumothorax. Right central venous catheter with tip over the cavoatrial junction region. Surgical clips in the right axilla. IMPRESSION: Shallow inspiration.  No evidence of active pulmonary disease. Electronically Signed   By: Lucienne Capers M.D.   On: 02/04/2017 06:43    Old chart reviewed Case discussed w dr Cathleen Fears Case discussed with ED RN cxr reviewed no edema or infiltrate  Assessment/Plan 71 yo female with stage 4 breast cancer with pancytopenia, worsening respiratory, liver and renal failure  Principal Problem:   Acute respiratory failure with hypoxia ([place])- I believe her sob is mostly due to her ascites and we talked about doing a  paracentesis to help alleviate some of her discomfort but now with her bp dropping I dont think she will tolerate this.  Unfortunately I think she will clinically deteriorate during this hospitalization, see below for end of life discussion.  Empirically placing on broad spectrum antibiotics due to severity of illness.  Ck abg.  Ck lactic, ck procalcitonin.  Active Problems:   Ascites- noted, consider tap if she stabalizes   Breast cancer of upper-outer quadrant of right female breast ([place])- noted   Bone metastases ([place])- noted   Pulmonary embolism ([place])- on xaralto, VQ pending, cannot ct due to [facility].  Compliant with xaralto   Diabetes mellitus without complication ([place])- hold long acting insulin, place on SSI   SOB (shortness of breath)- due to ascites likely   Pancytopenia due to chemotherapy ([place])- noted   AKI (acute kidney injury) ([place])- noted   Hepatic failure ([place])- noted    Had a long discussion about her code status.  She is aware of how sick she is and that she will likely need pressors/and or intubation if her respiratory status worsens.  She is also aware that she may not come off life support.  She wishes to be full code but only temporarily.  She wishes to have pressor support if needed.  She is aware that she may not make it through this hospitalization.  She has a daughter who lives locally who she wants to make her medical decisions about when to take her off life support (she says 1-2days).  She has also a son who lives in [place] who is her eldest but she wants her daughter to make decisions if need be.  I have requested PCCM see the patient.  Will place her in ICU.   DVT prophylaxis:  On xaralto Code Status:  full Family Communication:  none Disposition Plan:  Per day team Consults called:   PCCM Admission status:  Admit to icu   Shannia Jacuinde A MD [facility]  If 7PM-7AM, please contact night-coverage [email or web address] Password [email or web address]  02/07/2017, 10:04 AM

## 2017-02-21 NOTE — ED Notes (Signed)
Bed: ZJ69 Expected date:  Expected time:  Means of arrival:  Comments: EMS 71 yo female SOB

## 2017-02-21 NOTE — Progress Notes (Signed)
Pharmacy Antibiotic Note  Tonya Alvarez is a 71 y.o. female admitted on 02/22/2017 with sepsis.  PMH significant for metastatic breast cancer on chemotherapy, received gemcitabine on 10/15.  Pharmacy has been consulted for vancomycin and cefepime dosing.  Presenting with respiratory failure, ascites, and worsening renal and hepatic failure.  Plan: Vancomycin 2500 mg x 1 then 1500 mg IV q24h.  Using obese dosing nomogram, normalized CrCl~32 ml/min. Cefepime 2g IV q24h. Daily SCr.  Height: 5\' 9"  (175.3 cm) Weight: (!) 314 lb 9.5 oz (142.7 kg) IBW/kg (Calculated) : 66.2   Temp (24hrs), Avg:97.4 F (36.3 C), Min:97.3 F (36.3 C), Max:97.4 F (36.3 C)   Recent Labs Lab 02/15/17 1033 02/18/17 1433 02/18/17 1433 02/25/2017 0640  WBC 6.9 2.0*  --  1.9*  CREATININE 0.9  --  1.4* 1.86*    Estimated Creatinine Clearance: 43.6 mL/min (A) (by C-G formula based on SCr of 1.86 mg/dL (H)).    Allergies  Allergen Reactions  . Amoxicillin Nausea And Vomiting    Has patient had a PCN reaction causing immediate rash, facial/tongue/throat swelling, SOB or lightheadedness with hypotension: Unknown Has patient had a PCN reaction causing severe rash involving mucus membranes or skin necrosis: No Has patient had a PCN reaction that required hospitalization: No Has patient had a PCN reaction occurring within the last 10 years: Yes If all of the above answers are "NO", then may proceed with Cephalosporin use.   Marland Kitchen Hydrocodone Itching  . Lisinopril Cough    Antimicrobials this admission: 10/18 Vancomycin >> 10/18 Cefepime >>  Dose adjustments this admission:   Microbiology results:  Thank you for allowing pharmacy to be a part of this patient's care.  Hershal Coria 02/18/2017 11:13 AM

## 2017-02-22 ENCOUNTER — Inpatient Hospital Stay (HOSPITAL_COMMUNITY): Payer: Medicare Other

## 2017-02-22 DIAGNOSIS — J810 Acute pulmonary edema: Secondary | ICD-10-CM

## 2017-02-22 DIAGNOSIS — K7290 Hepatic failure, unspecified without coma: Secondary | ICD-10-CM

## 2017-02-22 DIAGNOSIS — Z7189 Other specified counseling: Secondary | ICD-10-CM

## 2017-02-22 DIAGNOSIS — Z515 Encounter for palliative care: Secondary | ICD-10-CM

## 2017-02-22 LAB — CBC
HCT: 19.3 % — ABNORMAL LOW (ref 36.0–46.0)
HCT: 18.4 % — ABNORMAL LOW (ref 36.0–46.0)
HEMOGLOBIN: 6.6 g/dL — AB (ref 12.0–15.0)
Hemoglobin: 6.2 g/dL — CL (ref 12.0–15.0)
MCH: 28.1 pg (ref 26.0–34.0)
MCH: 27.7 pg (ref 26.0–34.0)
MCHC: 34.2 g/dL (ref 30.0–36.0)
MCHC: 33.7 g/dL (ref 30.0–36.0)
MCV: 82.1 fL (ref 78.0–100.0)
MCV: 82.1 fL (ref 78.0–100.0)
PLATELETS: 40 10*3/uL — AB (ref 150–400)
PLATELETS: 31 10*3/uL — AB (ref 150–400)
RBC: 2.35 MIL/uL — AB (ref 3.87–5.11)
RBC: 2.24 MIL/uL — AB (ref 3.87–5.11)
RDW: 18.3 % — ABNORMAL HIGH (ref 11.5–15.5)
RDW: 17.8 % — ABNORMAL HIGH (ref 11.5–15.5)
WBC: 1.2 10*3/uL — AB (ref 4.0–10.5)
WBC: 1 10*3/uL — AB (ref 4.0–10.5)

## 2017-02-22 LAB — COMPREHENSIVE METABOLIC PANEL
ALK PHOS: 625 U/L — AB (ref 38–126)
ALT: 131 U/L — AB (ref 14–54)
AST: 493 U/L — AB (ref 15–41)
Albumin: 2.2 g/dL — ABNORMAL LOW (ref 3.5–5.0)
Anion gap: 13 (ref 5–15)
BUN: 74 mg/dL — AB (ref 6–20)
CALCIUM: 6.6 mg/dL — AB (ref 8.9–10.3)
CHLORIDE: 106 mmol/L (ref 101–111)
CO2: 16 mmol/L — AB (ref 22–32)
CREATININE: 1.86 mg/dL — AB (ref 0.44–1.00)
GFR calc Af Amer: 31 mL/min — ABNORMAL LOW (ref >60)
GFR calc non Af Amer: 27 mL/min — ABNORMAL LOW (ref >60)
Glucose, Bld: 169 mg/dL — ABNORMAL HIGH (ref 65–99)
Potassium: 4.5 mmol/L (ref 3.5–5.1)
SODIUM: 135 mmol/L (ref 135–145)
Total Bilirubin: 10.1 mg/dL — ABNORMAL HIGH (ref 0.3–1.2)
Total Protein: 5 g/dL — ABNORMAL LOW (ref 6.5–8.1)

## 2017-02-22 LAB — GLUCOSE, CAPILLARY
GLUCOSE-CAPILLARY: 173 mg/dL — AB (ref 65–99)
Glucose-Capillary: 159 mg/dL — ABNORMAL HIGH (ref 65–99)
Glucose-Capillary: 195 mg/dL — ABNORMAL HIGH (ref 65–99)
Glucose-Capillary: 239 mg/dL — ABNORMAL HIGH (ref 65–99)
Glucose-Capillary: 234 mg/dL — ABNORMAL HIGH (ref 65–99)

## 2017-02-22 LAB — HEMOGLOBIN AND HEMATOCRIT, BLOOD
HCT: 18.7 % — ABNORMAL LOW (ref 36.0–46.0)
Hemoglobin: 6.3 g/dL — CL (ref 12.0–15.0)

## 2017-02-22 LAB — PROTIME-INR
INR: 2.41
PROTHROMBIN TIME: 26 s — AB (ref 11.4–15.2)

## 2017-02-22 LAB — BLOOD GAS, ARTERIAL
ACID-BASE DEFICIT: 10.2 mmol/L — AB (ref 0.0–2.0)
Bicarbonate: 15.3 mmol/L — ABNORMAL LOW (ref 20.0–28.0)
DRAWN BY: 22563
Delivery systems: POSITIVE
Expiratory PAP: 5
FIO2: 30
Inspiratory PAP: 14
MECHVT: 0.663 mL
O2 SAT: 98.3 %
Patient temperature: 95.9
RATE: 16 resp/min
pCO2 arterial: 32.3 mmHg (ref 32.0–48.0)
pH, Arterial: 7.289 — ABNORMAL LOW (ref 7.350–7.450)
pO2, Arterial: 129 mmHg — ABNORMAL HIGH (ref 83.0–108.0)

## 2017-02-22 LAB — FIBRINOGEN: FIBRINOGEN: 482 mg/dL — AB (ref 210–475)

## 2017-02-22 LAB — PHOSPHORUS: Phosphorus: 6.9 mg/dL — ABNORMAL HIGH (ref 2.5–4.6)

## 2017-02-22 LAB — MAGNESIUM: Magnesium: 2.5 mg/dL — ABNORMAL HIGH (ref 1.7–2.4)

## 2017-02-22 LAB — PREPARE RBC (CROSSMATCH)

## 2017-02-22 MED ORDER — METOLAZONE 5 MG PO TABS
10.0000 mg | ORAL_TABLET | Freq: Once | ORAL | Status: AC
  Administered 2017-02-22: 10 mg via ORAL
  Filled 2017-02-22: qty 2

## 2017-02-22 MED ORDER — SODIUM CHLORIDE 0.9 % IV SOLN
Freq: Once | INTRAVENOUS | Status: AC
  Administered 2017-02-22: 11:00:00 via INTRAVENOUS

## 2017-02-22 MED ORDER — HYDROCERIN EX CREA
TOPICAL_CREAM | Freq: Two times a day (BID) | CUTANEOUS | Status: DC
  Administered 2017-02-22 (×2): via TOPICAL
  Filled 2017-02-22: qty 113

## 2017-02-22 MED ORDER — MORPHINE SULFATE (PF) 4 MG/ML IV SOLN
4.0000 mg | INTRAVENOUS | Status: DC | PRN
  Administered 2017-02-22: 2 mg via INTRAVENOUS
  Administered 2017-02-23 (×2): 4 mg via INTRAVENOUS
  Filled 2017-02-22 (×3): qty 1

## 2017-02-22 MED ORDER — SODIUM CHLORIDE 0.9% FLUSH
10.0000 mL | Freq: Two times a day (BID) | INTRAVENOUS | Status: DC
  Administered 2017-02-22 (×2): 10 mL

## 2017-02-22 MED ORDER — CHLORHEXIDINE GLUCONATE CLOTH 2 % EX PADS
6.0000 | MEDICATED_PAD | Freq: Every day | CUTANEOUS | Status: DC
  Administered 2017-02-22: 6 via TOPICAL

## 2017-02-22 MED ORDER — SODIUM CHLORIDE 0.9% FLUSH: 10.0000 mL | INTRAVENOUS | Status: DC | PRN

## 2017-02-22 NOTE — Progress Notes (Signed)
Peripherally Inserted Central Catheter/Midline Placement  The IV Nurse has discussed with the patient and/or persons authorized to consent for the patient, the purpose of this procedure and the potential benefits and risks involved with this procedure.  The benefits include less needle sticks, lab draws from the catheter, and the patient may be discharged home with the catheter. Risks include, but not limited to, infection, bleeding, blood clot (thrombus formation), and puncture of an artery; nerve damage and irregular heartbeat and possibility to perform a PICC exchange if needed/ordered by physician.  Alternatives to this procedure were also discussed.  Bard Power PICC patient education guide, fact sheet on infection prevention and patient information card has been provided to patient /or left at bedside.    PICC/Midline Placement Documentation  PICC Double Lumen 08/67/61 PICC Left Cephalic 45 cm (Active)  Indication for Insertion or Continuance of Line Poor Vasculature-patient has had multiple peripheral attempts or PIVs lasting less than 24 hours 02/22/2017  9:00 AM  Site Assessment Clean;Dry;Intact 02/22/2017  9:00 AM  Lumen #1 Status Flushed;Blood return noted 02/22/2017  9:00 AM  Lumen #2 Status Flushed;Blood return noted 02/22/2017  9:00 AM  Dressing Type Transparent 02/22/2017  9:00 AM  Dressing Status Clean;Dry;Intact;Antimicrobial disc in place 02/22/2017  9:00 AM  Dressing Intervention New dressing 02/22/2017  9:00 AM  Dressing Change Due 03/01/17 02/22/2017  9:00 AM       Jule Economy Horton 02/22/2017, 9:18 AM

## 2017-02-22 NOTE — Care Management Note (Signed)
Case Management Note  Patient Details  Name: ROCIO ROAM MRN: [id number] Date of Birth: 01/06/1946  Subjective/Objective:                  Septic shock  Action/Plan: Date:  February 22, 2017 Chart reviewed for concurrent status and case management needs.  Will continue to follow patient progress.  Discharge Planning: following for needs  Expected discharge date: [id number]  Velva Harman, BSN, [place], [place]   Expected Discharge Date:   (UNKNOWN)               Expected Discharge Plan:  Home/Self Care  In-House Referral:     Discharge planning Services  CM Consult  Post Acute Care Choice:    Choice offered to:     DME Arranged:    DME Agency:     HH Arranged:    HH Agency:     Status of Service:  In process, will continue to follow  If discussed at Long Length of Stay Meetings, dates discussed:    Additional Comments:  Leeroy Cha, RN 02/22/2017, 8:36 AM

## 2017-02-22 NOTE — Progress Notes (Signed)
eLink Physician-Brief Progress Note Patient Name: Tonya Alvarez DOB: November 05, 1945 MRN: [id number]   Date of Service  02/22/2017  HPI/Events of Note  Anemia, pancytopenia  tx 1 unit Type prior  eICU Interventions       Intervention Category Intermediate Interventions: Diagnostic test evaluation;Communication with other healthcare providers and/or family  Raylene Miyamoto. 02/22/2017, 5:58 AM

## 2017-02-22 NOTE — Progress Notes (Signed)
PULMONARY / CRITICAL CARE MEDICINE   Name: LUANA TATRO MRN: [id number] DOB: Apr 05, 1946    ADMISSION DATE:  02/07/2017 CONSULTATION DATE:  02/20/2017  REFERRING MD:  Dr. Shanon Brow   CHIEF COMPLAINT:  SOB   HISTORY OF PRESENT ILLNESS:   71 y/o F who presented to [facility] on 10/18 with increasing shortness of breath.    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  She was initially diagnosed in 2015.  The patient was seen on 10/15 by her Oncologist (Dr. Lindi Adie).  The week prior to that visit she required 4 units of PRBC's for anemia.  Her labs on 10/15 were notable for CO2 21, Alk Phos 851, albumin 1.7, AST 389, ALT 139.  Dr. Geralyn Flash Notes reflect that he was treating through her elevated LFTs but was considering discontinuing and recommending hospice.  She presented to [facility] ER on 10/18 with worsening shortness of breath.  Symptoms increased on 10/16.  She reports difficulty taking a deep breath as it hurts her abdomen.  In addition to SOB she had increased diffuse swelling.  She reports swelling has increased over the past week.  The patient states she ordinarily does not have pain. She denies fevers, chills, nausea, vomiting. No cough or sputum production.  She has had some diarrhea The patient was noted to be hypotensive on presentation to the ER.she was given 1 L normal saline with improvement in blood pressure.  Chest x-ray was negative for acute infiltrate.  Initial labs-NA 135, K4.1, chloride 104, CO2 17, glucose 109, BUN 63, creatinine 1.86, anion gap 14, alkaline phosphatase 725, albumin 1.7, AST 660, ALT 161, WBC 1.9, hemoglobin and platelets 60.    PCCM consulted for evaluation of hypotension.   SUBJECTIVE: Pt reports she just took her bipap off to get a drink of water.  Feels short of breath off the bipap.  PICC team at bedside for insertion.   Hgb down this am, pending RBC transfusion.  Pt states she has a BM every time she coughs. 700 ml UOP overnight / remains positive balance.   VITAL SIGNS: BP (!) 106/52   Pulse 85   Temp (!) 96.2 F (35.7 C) (Axillary)   Resp 15   Ht _0  (1.753 m)   Wt (!) 314 lb 9.5 oz (142.7 kg)   SpO2 100%   BMI 46.46 kg/m   HEMODYNAMICS:    VENTILATOR SETTINGS:    INTAKE / OUTPUT: I/O last 3 completed shifts: In: 1954.4 [I.V.:1004.4; IV Piggyback:950] Out: 700 [Urine:700]  PHYSICAL EXAMINATION: General:  Chronically ill appearing female in NAD HEENT: MM pink/dry, jaundice PSY: calm/appropriate Neuro: AAOx4, speech clear, MAE  CV: s1s2 rrr, no m/r/g PULM: even/non-labored, lungs bilaterally diminished, occasional wheeze  WL:SLHT, non-tender, bsx4 active  Extremities: warm/dry, anasarca edema  Skin: no rashes or lesions  LABS:  BMET  Recent Labs Lab 02/18/17 1433 03/05/2017 0640 02/22/17 0439  NA 138 135 135  K 4.2 4.1 4.5  CL  --  104 106  CO2 21* 17* 16*  BUN 34.8* 63* 74*  CREATININE 1.4* 1.86* 1.86*  GLUCOSE 283* 109* 169*    Electrolytes  Recent Labs Lab 02/18/17 1433 02/18/2017 0640 02/22/17 0439  CALCIUM 7.9* 6.9* 6.6*  MG  --   --  2.5*  PHOS  --   --  6.9*    CBC  Recent Labs Lab  02/18/17 1433 02/20/2017 0640 02/22/17 0439  WBC 2.0* 1.9* 1.2*  HGB 9.0* 7.9* 6.6*  HCT 28.1* 24.0* 19.3*  PLT 133* 60* 40*    Coag's No results for input(s): APTT, INR in the last 168 hours.  Sepsis Markers  Recent Labs Lab 02/28/2017 1213 02/07/2017 1600  LATICACIDVEN 1.6 1.4  PROCALCITON 10.37  --     ABG  Recent Labs Lab 03/01/2017 1225 02/22/17 0525  PHART 7.337* 7.289*  PCO2ART 31.3* 32.3  PO2ART 126* 129*    Liver Enzymes  Recent Labs Lab 02/18/17 1433 03/05/2017 0640 02/22/17 0439  AST 389* 660* 493*  ALT 139* 161* 131*  ALKPHOS 851* 725* 625*  BILITOT 6.68* 9.4* 10.1*  ALBUMIN 1.7* 1.7* 2.2*    Cardiac Enzymes No results for  input(s): TROPONINI, PROBNP in the last 168 hours.  Glucose  Recent Labs Lab 02/18/2017 1323 02/26/2017 1612 03/02/2017 1925 02/12/2017 2307 02/22/17 0326 02/22/17 0735  GLUCAP 93 111* 153* 152* 159* 173*    Imaging Dg Chest Port 1 View  Result Date: 02/22/2017 CLINICAL DATA:  Shortness of breath.  Breast cancer. EXAM: PORTABLE CHEST 1 VIEW COMPARISON:  03/03/2017 . FINDINGS: Patient is rotated to the right. PowerPort catheter noted with lead tip projected over the cavoatrial junction. Mild left base atelectasis. No pleural effusion or pneumothorax. Surgical clips over the right axilla . IMPRESSION: 1. Patient rotated to the right. PowerPort catheter noted with lead tip over the cavoatrial junction . 2.  Mild left base atelectasis . Electronically Signed   By: Marcello Moores  Register   On: 02/22/2017 06:53     STUDIES:    CULTURES: BCx2 10/18 >>  UA 10/18 >>   ANTIBIOTICS: Cefepime 10/18 >>  Linezolid 10/18 >>    SIGNIFICANT EVENTS: 10/18  Admit with SOB 10/19  Hgb decreased, 1 unit PRBC.  Lasix gtt increased to 10 mg/hr  LINES/TUBES: R CW Port 10/18 >>   DISCUSSION: 71 y/o F with stage IV metastatic breast cancer admitted with hypotension, AKI, worsening LFT's, dyspnea and anasarca.  Concern for progression of malignancy vs new infectious process.   ASSESSMENT / PLAN:  PULMONARY A: Acute Respiratory Distress - in setting of metastatic breast cancer  Elevated D-Dimer, Hx of PE  P:   O2 as needed to support sats > 90% PRN BiPAP for patient comfort Patient wants to be intubated if necessary but no more than a couple days  Hold xarelto for now > decreased Hgb / thrombocytopenia  CARDIOVASCULAR A:  Shock - volume depletion vs infectious etiology vs malignant progression  Hx HTN, HLD, MR P:  ICU monitoring  Levophed for MAP > 65 Lasix gtt for volume removal, increase to 10 mg/hr Albumin 25g Q6  RENAL A:   AKI  AGMA P:   Trend BMP / urinary output Replace  electrolytes as indicated Avoid nephrotoxic agents, ensure adequate renal perfusion  GASTROINTESTINAL A:   Liver Metastasis  P:   Clear liquid diet as tolerated  PRN compazine for nausea / vomiting   HEMATOLOGIC / ONC  A:   Chronic Anticoagulation - in setting of prior PE  Stage IV Breast Cancer Anemia - recent transfusion of 4 units PRBC's P:  Trend CBC  HOLD Xarelto, see above  Monitor for bleeding   Pending PRBC transfusion 10/19   INFECTIOUS A:   Shock - rule out sepsis / infectious etiology  P:   Follow cultures as above  Empiric abx  Avoid vanco in setting of AKI  If  culture negative after 48 hours, consider d/c linezolid Monitor stool volume  ENDOCRINE A:   DM    P:   SSI  NEUROLOGIC A:   Cancer Associated Pain  P:   RASS goal: n/a  PRN morphine for pain  Continue gabapentin   FAMILY  - Updates: Patient updated at bedside.    - Inter-disciplinary family meet or Palliative Care meeting due by:  See prior iPAL note.  Full code. Patient desires ~ 48 hours aggressive support if necessary.     CC Time:  56 minutes   Noe Gens, NP-C [place] [facility] Pgr: [phone] or if no answer [phone] 02/22/2017, 8:53 AM  Attending Note:  71 year old female with metastatic breast cancer in respiratory failure who required BiPAP.  Did not diurese well with lasix drip.  On exam, lethargic but still protecting her airway and does not wish to be intubated at this time.  I reviewed CXR myself , pulmonary edema noted that is very significant.  Will continue active diuresis and add zaroxolyn to lasix.  Correct electrolytes.  Transfuse one unit of pRBC.  Full code for now.  Neutropenic precautions.  Hold in ICU and take over on the critical care service.  The patient is critically ill with multiple organ systems failure and requires high complexity decision making for assessment and support, frequent evaluation and titration of therapies, application of  advanced monitoring technologies and extensive interpretation of multiple databases.   Critical Care Time devoted to patient care services described in this note is  35  Minutes. This time reflects time of care of this signee Dr Jennet Maduro. This critical care time does not reflect procedure time, or teaching time or supervisory time of PA/NP/Med student/Med Resident etc but could involve care discussion time.  Rush Farmer, M.D. [facility] Pulmonary/Critical Care Medicine. Pager: [phone]. After hours pager: [phone].

## 2017-02-22 NOTE — Progress Notes (Signed)
eLink Physician-Brief Progress Note Patient Name: Tonya Alvarez DOB: 05-Nov-1945 MRN: [id number]   Date of Service  02/22/2017  HPI/Events of Note  Pt's family informed bedside team that patient requests changed to DNR status.  Bedside team confirmed with pt.   eICU Interventions  Ordered changed to DNR status.     Intervention Category Major Interventions: Other:  Jamilex Bohnsack 02/22/2017, 7:58 PM

## 2017-02-22 NOTE — Consult Note (Signed)
Consultation Note Date: 02/22/2017   Patient Name: Tonya Alvarez  DOB: 24-Dec-1945  MRN: [id number]  Age / Sex: 71 y.o., female  PCP: Jacelyn Pi, MD Referring Physician: Rush Farmer, MD  Reason for Consultation: Establishing goals of care  HPI/Patient Profile: 71 y.o. female  with past medical history of stage 4 breast cancer, history of PE,  with metastasis to her liver, lungs and bones (currently on palliative Gemzar- last treated on Monday, 02/18/17)  admitted on 02/05/2017 with increasing shortness of breath, and increasing lower extremity edema. Workup revealed advanced liver failure (likely from advancing mets), worsening renal failure, anemia, neutropenia. She was noted to have diffuse anasarca and abdominal ascites. 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 Per their discussion she very clearly stated she wanted at least 3 days of full scope care including intubation. During admission her respiratory status has worsened and she is on intermittent bipap IV pressors. She is not showing indications of clinical improvement. Palliative medicine team consulted for continued [place].   Clinical Assessment and Goals of Care:  I have reviewed medical records including EPIC notes, labs and imaging, received report from patient's nurse, assessed the patient and then met at the bedside along with patient to discuss diagnosis prognosis, GOC, EOL wishes, and options.  I introduced Palliative Medicine as specialized medical care for people living with serious illness. It focuses on providing relief from the symptoms and stress of a serious illness. The goal is to improve quality of life for both the patient and the family.  Patient in bed, first tells me she is unable to talk to me due to significant pain from headache- likely from pressure from bipap  mask. She requested lights down. I had RN administer morphine as I stayed bedside to monitor for response. Patient's hemodynamic status remained stable with morphine administration.  After receiving morphine she was more comfortable and requested bipap removal. She drank several cups of liquids and ate an icy as we spoke.   I reviewed her understanding of her medical status. She tells me she has pulmonary edema, and metastatic breast cancer. She tells me she knows she is at end of life. We discussed her expressed goals of 3 days of aggressive care and she states that her goals for this are "to see if anything will turn around". When discussing code status she very specifically states she would want "one full code cycle". If she were unconscious and could not make decisions for herself she would want her daughter, Tonya Alvarez to make those decisions for her.    I asked her if she was told by her physicians that she was not improving would her preferences for aggressive medical care change- and she notes that yes- she would likely want to focus on comfort.   After talking with Mckynlie I was able to talk with her daughter, Tonya Alvarez, via telephone. Tonya Alvarez feels Florena has been declining significantly and is not going to survive this  hospitalization. Tonya Alvarez feels that patient is trying to survive until her son arrives tonight from out of town. Patient has been asking to see her children and grandchildren. Tonya Alvarez tells me that patient has told her that if she were intubated, she would only want to be intubated for three days. Stephanie requests follow-up [place] discussion with patient, daughter and son tomorrow as she believes patient will wish to transition to less aggressive measures once her son arrives.     Primary Decision Maker PATIENT    SUMMARY OF RECOMMENDATIONS  -Continue current level of care -Recommend using morphine before applying bipap as this decreases pt SOB and increases comfort  without compromising her hemodynamic status for now -PMT will follow with family tomorrow morning for continued [place]     Code Status/Advance Care Planning:  Full code (limited to one code cycle)    Symptom Management:   Morphine 68m q2hr prn for SOB, or pain  Palliative Prophylaxis:   Frequent Pain Assessment  Prognosis:    < 2 weeks d/t worsening respiratory status, progressing metastatic cancer, hepatic failure  Discharge Planning: To Be Determined  Primary Diagnoses: Present on Admission: . Dyspnea . Breast cancer of upper-outer quadrant of right female breast ([place] . Bone metastases ([place] . Pulmonary embolism ([place] . Ascites . Pancytopenia due to chemotherapy ([place] . AKI (acute kidney injury) ([place] . Acute respiratory failure with hypoxia ([place] . Hepatic failure ([place]   I have reviewed the medical record, interviewed the patient and family, and examined the patient. The following aspects are pertinent.  Past Medical History:  Diagnosis Date  . Arthritis    "knees, ankles" (09/22/2014  . Breast cancer ([place] 02/04/14   right breast  . Cancer of right breast ([place]   . Chronic lower back pain   . Complication of anesthesia    O2 SAT  DROP   . DDD (degenerative disc disease), cervical   . DDD (degenerative disc disease), lumbosacral   . DDD (degenerative disc disease), thoracolumbar   . Diabetes mellitus without complication ([place]   . Diabetic peripheral neuropathy ([facility]   . Heart murmur    mitral valve  . Hyperlipidemia   . Hypertension   . Mitral regurgitation   . Neuromuscular disorder ([place]   . Sinus headache    "seasonal"  . Type II diabetes mellitus ([place]    Social History   Social History  . Marital status: Widowed    Spouse name: N/A  . Number of children: N/A  . Years of education: N/A   Social History Main Topics  . Smoking status: Former Smoker    Packs/day: 0.75    Years: 30.00    Types: Cigarettes    Quit date: 05/07/2013  .  Smokeless tobacco: Never Used  . Alcohol use No     Comment: 09/22/2014 "I'll drink a few times/yr"  . Drug use: No  . Sexual activity: Yes   Other Topics Concern  . None   Social History Narrative  . None   Family History  Problem Relation Age of Onset  . Cancer Father        lung cancer  . Cancer Maternal Aunt        Kidney  . Hypertension Maternal Uncle    Scheduled Meds: . Chlorhexidine Gluconate Cloth  6 each Topical Daily  . gabapentin  300 mg Oral TID  . hydrocerin   Topical BID  . insulin aspart  0-9 Units Subcutaneous Q4H  . metolazone  10  mg Oral Once  . sodium chloride flush  10-40 mL Intracatheter Q12H   Continuous Infusions: . albumin human    . ceFEPime (MAXIPIME) IV 2 g (02/22/17 1316)  . furosemide (LASIX) infusion 10 mg/hr (02/22/17 1030)  . linezolid (ZYVOX) IV 600 mg (02/22/17 1240)  . norepinephrine (LEVOPHED) Adult infusion 8 mcg/min (02/22/17 1247)   PRN Meds:.morphine injection, prochlorperazine, sodium chloride flush Medications Prior to Admission:  Prior to Admission medications   Medication Sig Start Date End Date Taking? Authorizing Provider  calcium-vitamin D (OSCAL WITH D) 500-200 MG-UNIT per tablet Take 1 tablet by mouth daily.   Yes [provider]  clindamycin (CLEOCIN) 300 MG capsule Take 900 mg by mouth See admin instructions. Take 3 capsules (900 mg) by mouth one hour prior to dental appointment   Yes [provider]  fluticasone (FLONASE) 50 MCG/ACT nasal spray Place 1 spray into both nostrils daily as needed for allergies or rhinitis. 07/26/14  Yes Nicholas Lose, MD  furosemide (LASIX) 40 MG tablet TAKE 1 TABLET BY MOUTH  DAILY Patient taking differently: TAKE 0.5 TABLET BY MOUTH  DAILY 01/22/17  Yes Nicholas Lose, MD  gabapentin (NEURONTIN) 300 MG capsule Take 300 mg by mouth 3 (three) times daily.    Yes [provider]  ibuprofen (ADVIL,MOTRIN) 200 MG tablet Take 600 mg by mouth 2 (two) times daily.   Yes  [provider]  insulin lispro protamine-lispro (HUMALOG 75/25 MIX) (75-25) 100 UNIT/ML SUSP injection Inject 40-65 Units into the skin 2 (two) times daily with a meal. Sliding Scale   Yes [provider]  Liniments (SALONPAS PAIN RELIEF PATCH EX) Place 1 patch onto the skin daily as needed (PAIN).   Yes [provider]  Multiple Vitamin (MULTIVITAMIN WITH MINERALS) TABS tablet Take 1 tablet by mouth every evening.    Yes [provider]  prochlorperazine (COMPAZINE) 10 MG tablet Take 1 tablet (10 mg total) by mouth every 6 (six) hours as needed (Nausea or vomiting). 02/04/17  Yes Nicholas Lose, MD  rivaroxaban (XARELTO) 20 MG TABS tablet TAKE 1 TABLET BY MOUTH  DAILY WITH SUPPER 10/30/16  Yes Nicholas Lose, MD  tiZANidine (ZANAFLEX) 4 MG tablet Take 4 mg by mouth at bedtime as needed for muscle spasms.    Yes [provider]  doxycycline (VIBRA-TABS) 100 MG tablet Take 1 tablet (100 mg total) by mouth 2 (two) times daily. Patient not taking: Reported on 02/06/2017 01/09/17   Gardenia Phlegm, NP  metFORMIN (GLUCOPHAGE-XR) 500 MG 24 hr tablet Take 2 tablets (1,000 mg total) by mouth 2 (two) times daily. Patient not taking: Reported on 01/09/2017 10/31/15   Hosie Poisson, MD  ondansetron (ZOFRAN) 8 MG tablet Take 1 tablet (8 mg total) by mouth 2 (two) times daily as needed (Nausea or vomiting). Patient not taking: Reported on 02/06/2017 02/04/17   Nicholas Lose, MD   Allergies  Allergen Reactions  . Amoxicillin Nausea And Vomiting    Has patient had a PCN reaction causing immediate rash, facial/tongue/throat swelling, SOB or lightheadedness with hypotension: Unknown Has patient had a PCN reaction causing severe rash involving mucus membranes or skin necrosis: No Has patient had a PCN reaction that required hospitalization: No Has patient had a PCN reaction occurring within the last 10 years: Yes If all of the above answers are "NO", then may proceed  with Cephalosporin use.   Marland Kitchen Hydrocodone Itching  . Lisinopril Cough   Review of Systems  Physical Exam  Constitutional: She  is oriented to person, place, and time. She appears well-developed.  HENT:  Head: Normocephalic.  Eyes: Scleral icterus is present.  Cardiovascular: Normal rate and regular rhythm.   Pulmonary/Chest:  tachypneic  Abdominal: She exhibits distension.  ascites  Neurological: She is alert and oriented to person, place, and time.  Lethargic after morphine  Skin:  jaundice  Psychiatric: She has a normal mood and affect. Her behavior is normal. Judgment and thought content normal.  Nursing note and vitals reviewed.   Vital Signs: BP (!) 102/44   Pulse 98   Temp (!) 95.5 F (35.3 C) Comment: Simultaneous filing. User may not have seen previous data.  Resp (!) 23   Ht _0  (1.753 m)   Wt (!) 142.7 kg (314 lb 9.5 oz)   SpO2 100%   BMI 46.46 kg/m  Pain Assessment: 0-10   Pain Score: Asleep   SpO2: SpO2: 100 % O2 Device:SpO2: 100 % O2 Flow Rate: .O2 Flow Rate (L/min): 2 L/min  IO: Intake/output summary:  Intake/Output Summary (Last 24 hours) at 02/22/17 1322 Last data filed at 02/22/17 1121  Gross per 24 hour  Intake          1984.43 ml  Output              850 ml  Net          1134.43 ml    LBM: Last BM Date: 02/27/2017 Baseline Weight: Weight: (!) 142.7 kg (314 lb 9.5 oz) Most recent weight: Weight:  (bed malfunction)     Palliative Assessment/Data: PPS: 20%     Thank you for this consult. Palliative medicine will continue to follow and assist as needed.   Time In: 1200 Time Out: 1430 Time Total: 150 minutes Greater than 50%  of this time was spent counseling and coordinating care related to the above assessment and plan.  Signed by: Mariana Kaufman, AGNP-C Palliative Medicine    Please contact Palliative Medicine Team phone at [phone] for questions and concerns.  For individual provider: See Shea Evans

## 2017-02-22 NOTE — Progress Notes (Signed)
Initial Nutrition Assessment  DOCUMENTATION CODES:   Morbid obesity  INTERVENTION:  - Continue CLD and advance diet if medically feasible. - Will monitor for medical course and associated nutrition-related needs.  NUTRITION DIAGNOSIS:   Increased nutrient needs related to catabolic illness, cancer and cancer related treatments as evidenced by estimated needs.  GOAL:   Patient will meet greater than or equal to 90% of their needs  MONITOR:   PO intake, Weight trends, Labs, I & O's  REASON FOR ASSESSMENT:   Low Braden  ASSESSMENT:   71 y.o. female with medical history significant of stage 4 breast cancer for the last 3 years comes in with worsening sob and swelling all over.  She just started palliative chemo again and just got her second round the last couple of days.  She has been declining for months.   Pt seen for low Braden. BMI indicates morbid obesity. Noted that weight is +18 lbs/8.2 kg since 10/1; weight had previously been stable since June. Pt on BiPAP at this time. CLD ordered and meal tray is in the room. Pt indicated wanting her mouth swabbed and having ocular pain; RD alerted RN.   A friend was at bedside and was able to provide the following information: Pt's son is to arrive this evening and her daughter, who lives locally, is at work today. Friend bought pt a hot dog, chips, and chicken salad from [employer] on Friday and pt was able to eat these items. Over the weekend, pt stayed with her daughter d/t not having power in her home.   Physical assessment done to upper body only and shows no muscle and no fat wasting. RN assessment reports severe edema to BLE. Weight changes outlined above.   Per notes, pt to remain full code at this time and would like to pursue aggressive measures, if warranted, such as ventilation for ~48 hours but does not want to be on life support for greater than a few days. She has bone, liver, and lung mets and had her second round of  Palliative chemo on 10/15.  Medications reviewed; 25 mg albumin QID, sliding scale Novolog.  Labs reviewed; CBGs: 159 and 173 mg/dL, BUN: 74 mg/dL, creatinine: 1.86 mg/dL, Ca: 6.6 mg/dL, Mg: 2.5 mg/dL, Alk Phos elevated, LFTs elevated, Phos: 6.38m/dL, GFR: 27 mL/min.   Drip: Lasix @ 10 mg/hr.     Diet Order:  Diet clear liquid Room service appropriate? Yes; Fluid consistency: Thin  Skin:  Wound (see comment) (Partial thickness wounds to R breast and R groin, per WOC note)  Last BM:  10/18  Height:   Ht Readings from Last 1 Encounters:  02/25/2017 _0  (1.753 m)    Weight:   Wt Readings from Last 1 Encounters:  03/02/2017 (!) 314 lb 9.5 oz (142.7 kg)    Ideal Body Weight:  65.91 kg  BMI:  Body mass index is 46.46 kg/m.  Estimated Nutritional Needs:   Kcal:  2280-2425 (16-17 kcal/kg)  Protein:  130-140 grams  Fluid:  per MD given anasarca/severe BLE edema  EDUCATION NEEDS:   No education needs identified at this time    JJarome Matin MS, RD, LDN, CJeromeInpatient Clinical Dietitian Pager # [phone] hours/weekend pager # [phone]

## 2017-02-22 NOTE — Consult Note (Deleted)
PULMONARY / CRITICAL CARE MEDICINE   Name: Tonya Alvarez MRN: [id number] DOB: Apr 05, 1946    ADMISSION DATE:  02/07/2017 CONSULTATION DATE:  02/20/2017  REFERRING MD:  Dr. Shanon Brow   CHIEF COMPLAINT:  SOB   HISTORY OF PRESENT ILLNESS:   71 y/o F who presented to [facility] on 10/18 with increasing shortness of breath.    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  She was initially diagnosed in 2015.  The patient was seen on 10/15 by her Oncologist (Dr. Lindi Adie).  The week prior to that visit she required 4 units of PRBC's for anemia.  Her labs on 10/15 were notable for CO2 21, Alk Phos 851, albumin 1.7, AST 389, ALT 139.  Dr. Geralyn Flash Notes reflect that he was treating through her elevated LFTs but was considering discontinuing and recommending hospice.  She presented to [facility] ER on 10/18 with worsening shortness of breath.  Symptoms increased on 10/16.  She reports difficulty taking a deep breath as it hurts her abdomen.  In addition to SOB she had increased diffuse swelling.  She reports swelling has increased over the past week.  The patient states she ordinarily does not have pain. She denies fevers, chills, nausea, vomiting. No cough or sputum production.  She has had some diarrhea The patient was noted to be hypotensive on presentation to the ER.she was given 1 L normal saline with improvement in blood pressure.  Chest x-ray was negative for acute infiltrate.  Initial labs-NA 135, K4.1, chloride 104, CO2 17, glucose 109, BUN 63, creatinine 1.86, anion gap 14, alkaline phosphatase 725, albumin 1.7, AST 660, ALT 161, WBC 1.9, hemoglobin and platelets 60.    PCCM consulted for evaluation of hypotension.   SUBJECTIVE: Pt reports she just took her bipap off to get a drink of water.  Feels short of breath off the bipap.  PICC team at bedside for insertion.   Hgb down this am, pending RBC transfusion.  Pt states she has a BM every time she coughs. 700 ml UOP overnight / remains positive balance.   VITAL SIGNS: BP (!) 106/52   Pulse 85   Temp (!) 96.2 F (35.7 C) (Axillary)   Resp 15   Ht _0  (1.753 m)   Wt (!) 314 lb 9.5 oz (142.7 kg)   SpO2 100%   BMI 46.46 kg/m   HEMODYNAMICS:    VENTILATOR SETTINGS:    INTAKE / OUTPUT: I/O last 3 completed shifts: In: 1954.4 [I.V.:1004.4; IV Piggyback:950] Out: 700 [Urine:700]  PHYSICAL EXAMINATION: General:  Chronically ill appearing female in NAD HEENT: MM pink/dry, jaundice PSY: calm/appropriate Neuro: AAOx4, speech clear, MAE  CV: s1s2 rrr, no m/r/g PULM: even/non-labored, lungs bilaterally diminished, occasional wheeze  WL:SLHT, non-tender, bsx4 active  Extremities: warm/dry, anasarca edema  Skin: no rashes or lesions  LABS:  BMET  Recent Labs Lab 02/18/17 1433 03/05/2017 0640 02/22/17 0439  NA 138 135 135  K 4.2 4.1 4.5  CL  --  104 106  CO2 21* 17* 16*  BUN 34.8* 63* 74*  CREATININE 1.4* 1.86* 1.86*  GLUCOSE 283* 109* 169*    Electrolytes  Recent Labs Lab 02/18/17 1433 02/18/2017 0640 02/22/17 0439  CALCIUM 7.9* 6.9* 6.6*  MG  --   --  2.5*  PHOS  --   --  6.9*    CBC  Recent Labs Lab  02/18/17 1433 02/20/2017 0640 02/22/17 0439  WBC 2.0* 1.9* 1.2*  HGB 9.0* 7.9* 6.6*  HCT 28.1* 24.0* 19.3*  PLT 133* 60* 40*    Coag's No results for input(s): APTT, INR in the last 168 hours.  Sepsis Markers  Recent Labs Lab 02/28/2017 1213 02/07/2017 1600  LATICACIDVEN 1.6 1.4  PROCALCITON 10.37  --     ABG  Recent Labs Lab 03/01/2017 1225 02/22/17 0525  PHART 7.337* 7.289*  PCO2ART 31.3* 32.3  PO2ART 126* 129*    Liver Enzymes  Recent Labs Lab 02/18/17 1433 03/05/2017 0640 02/22/17 0439  AST 389* 660* 493*  ALT 139* 161* 131*  ALKPHOS 851* 725* 625*  BILITOT 6.68* 9.4* 10.1*  ALBUMIN 1.7* 1.7* 2.2*    Cardiac Enzymes No results for  input(s): TROPONINI, PROBNP in the last 168 hours.  Glucose  Recent Labs Lab 02/18/2017 1323 02/26/2017 1612 03/02/2017 1925 02/09/2017 2307 02/22/17 0326 02/22/17 0735  GLUCAP 93 111* 153* 152* 159* 173*    Imaging Dg Chest Port 1 View  Result Date: 02/22/2017 CLINICAL DATA:  Shortness of breath.  Breast cancer. EXAM: PORTABLE CHEST 1 VIEW COMPARISON:  03/03/2017 . FINDINGS: Patient is rotated to the right. PowerPort catheter noted with lead tip projected over the cavoatrial junction. Mild left base atelectasis. No pleural effusion or pneumothorax. Surgical clips over the right axilla . IMPRESSION: 1. Patient rotated to the right. PowerPort catheter noted with lead tip over the cavoatrial junction . 2.  Mild left base atelectasis . Electronically Signed   By: Marcello Moores  Register   On: 02/22/2017 06:53     STUDIES:    CULTURES: BCx2 10/18 >>  UA 10/18 >>   ANTIBIOTICS: Cefepime 10/18 >>  Linezolid 10/18 >>    SIGNIFICANT EVENTS: 10/18  Admit with SOB 10/19  Hgb decreased, 1 unit PRBC.  Lasix gtt increased to 10 mg/hr  LINES/TUBES: R CW Port 10/18 >>   DISCUSSION: 71 y/o F with stage IV metastatic breast cancer admitted with hypotension, AKI, worsening LFT's, dyspnea and anasarca.  Concern for progression of malignancy vs new infectious process.   ASSESSMENT / PLAN:  PULMONARY A: Acute Respiratory Distress - in setting of metastatic breast cancer  Elevated D-Dimer, Hx of PE  P:   O2 as needed to support sats > 90% PRN BiPAP for patient comfort Patient wants to be intubated if necessary but no more than a couple days  Hold xarelto for now > decreased Hgb / thrombocytopenia  CARDIOVASCULAR A:  Shock - volume depletion vs infectious etiology vs malignant progression  Hx HTN, HLD, MR P:  ICU monitoring  Levophed for MAP > 65 Lasix gtt for volume removal, increase to 10 mg/hr Albumin 25g Q6  RENAL A:   AKI  AGMA P:   Trend BMP / urinary output Replace  electrolytes as indicated Avoid nephrotoxic agents, ensure adequate renal perfusion  GASTROINTESTINAL A:   Liver Metastasis  P:   Clear liquid diet as tolerated  PRN compazine for nausea / vomiting   HEMATOLOGIC / ONC  A:   Chronic Anticoagulation - in setting of prior PE  Stage IV Breast Cancer Anemia - recent transfusion of 4 units PRBC's P:  Trend CBC  HOLD Xarelto, see above  Monitor for bleeding   Pending PRBC transfusion 10/19   INFECTIOUS A:   Shock - rule out sepsis / infectious etiology  P:   Follow cultures as above  Empiric abx  Avoid vanco in setting of AKI  If  culture negative after 48 hours, consider d/c linezolid Monitor stool volume  ENDOCRINE A:   DM    P:   SSI  NEUROLOGIC A:   Cancer Associated Pain  P:   RASS goal: n/a  PRN morphine for pain  Continue gabapentin   FAMILY  - Updates: Patient updated at bedside.    - Inter-disciplinary family meet or Palliative Care meeting due by:  See prior iPAL note.  Full code. Patient desires ~ 48 hours aggressive support if necessary.     CC Time:  30 minutes   Noe Gens, NP-C [facility] Pgr: [phone] or if no answer [phone] 02/22/2017, 8:36 AM

## 2017-02-22 NOTE — Progress Notes (Signed)
Spoke with pt and her son and daughter, also.  All agree pt wants to be a full NCB.  Will inform E-link, MD.

## 2017-02-22 NOTE — Progress Notes (Signed)
Patient core temp shows hypothermia. Spoke with Dr. Phylliss Bob and she ordered bair hugger. Bair hugger applied. Will continue to monitor

## 2017-02-22 NOTE — Progress Notes (Signed)
After insertion of left upper arm PICC, oozing blood at insertion site noted.  Removed PIV that was below PICC insertion site and stopped running IV fluids, immediate bruising around removal of PIV noted. Pressure was held at both sites for approximately 10 minutes. Continues to ooze at PICC insertion site, pressure dressing applied. Arm was elevated and ice bag applied. No further oozing noted at PIV site, however PICC site continues to ooze. RN aware and will monitor. RN notified Sallye Ober, NP.

## 2017-02-22 NOTE — Progress Notes (Signed)
I was called for cross-coverage question by RN on this patient. RN says patient had PICC line placed today and now is bleeding from PICC line as well as having epistaxis. This raises question of possible DIC. Will order Fibrinogen and INR and will have NP see patent for evaluation. Has pancytopenia on CBC from this AM; will repeat CBC now as well.

## 2017-02-22 NOTE — Progress Notes (Signed)
Patient continues to bleed from PICC line around dressing and new onset nose bleed. Called black box and spoke with Dr. Phylliss Bob who ordered CBC, fibrinogen and INR. Will continue to monitor.

## 2017-02-22 NOTE — Consult Note (Signed)
[facility] Nurse wound consult note Reason for Consult:Right breast and right groin wounds, partial thickness Wound type: MASD Pressure Injury POA: NA (Pt in distress and obese, she can not tolerate being turned, she is on pressures and bipap, Sacrum has not been visualized)  Measurement: Beneath Right breast 5cm x 1cm x 0.1cm partial thickness mostly pink wound bed with small amout yellow exudate and slough.  Right groin is bleeding, partial thickness wounds, unable to measure individually due to obesity and pain of holding abd folds up. Basically takes up whole groin. Pink wound bed with bleeding. Wound bed:see above Drainage (amount, consistency, odor) see above Periwound:intact, edematous Dressing procedure/placement/frequency:I have provided nurses with orders for To below right breast, cleanse with NS, pat dry, apply petrolatum gauze, fold a dry gauze and adhere with paper tape, change daily. To skin fold over pubis bone, cleanse, dry, place NS moistened gauze, dry gauze and let skin fold hold in place, no tape.  To right groin, cleanse with NS, pat dry, Apply petrolatum gauze, place a folded ABD into fold, no tape. To left groin just clean and keep dry, place folded ABD in fold.   Pt is bleeding from labia also, MASD, To this area, Cleanse perineal area, gently dry, apply Purple Top Criticaid Barrier cream, place a DermaTherapy pillow case in folds to help with wicking moisture, perform daily when Foley Care is performed. Legs and feet are dry and scaly, family requesting Eucerin, will order. Pt already on specialty mattress but will need one ordered when transferred upstairs, order placed for secretary to order upon transfer orders. I did not order InterDry Ag+ due to patient's platelets are 40, all wounds bleeding, it would have to be discarded too frequently. If platelets increase can re-visit this idea. We will not follow, but will remain available to this patient, to nursing, and the medical and/or  surgical teams.  Please re-consult if we need to assist further.   Fara Olden, RN-C, WTA-C, [place] [facility]

## 2017-02-23 ENCOUNTER — Inpatient Hospital Stay (HOSPITAL_COMMUNITY): Payer: Medicare Other

## 2017-02-23 DIAGNOSIS — C7951 Secondary malignant neoplasm of bone: Secondary | ICD-10-CM

## 2017-02-23 DIAGNOSIS — Z515 Encounter for palliative care: Secondary | ICD-10-CM

## 2017-02-23 LAB — TYPE AND SCREEN
ABO/RH(D): A POS
ANTIBODY SCREEN: NEGATIVE
Unit division: 0

## 2017-02-23 LAB — BPAM RBC
Blood Product Expiration Date: 201810302359
ISSUE DATE / TIME: 201810191044
Unit Type and Rh: 6200

## 2017-02-23 LAB — GLUCOSE, CAPILLARY
Glucose-Capillary: 227 mg/dL — ABNORMAL HIGH (ref 65–99)
Glucose-Capillary: 234 mg/dL — ABNORMAL HIGH (ref 65–99)

## 2017-02-23 MED ORDER — SODIUM CHLORIDE 0.9 % IV SOLN
10.0000 mg/h | INTRAVENOUS | Status: DC
  Administered 2017-02-23: 10 mg/h via INTRAVENOUS
  Filled 2017-02-23: qty 10

## 2017-02-23 MED ORDER — MORPHINE BOLUS VIA INFUSION
5.0000 mg | INTRAVENOUS | Status: DC | PRN
  Administered 2017-02-23 (×2): 10 mg via INTRAVENOUS
  Filled 2017-02-23: qty 20

## 2017-02-23 MED ORDER — LORAZEPAM 2 MG/ML IJ SOLN: 2.0000 mg | INTRAMUSCULAR | Status: DC | PRN

## 2017-02-25 ENCOUNTER — Telehealth: Payer: Self-pay

## 2017-02-25 NOTE — Telephone Encounter (Signed)
On 02/25/17 I received a d/c from [facility] (original). The d/c is for cremation. The patient is a patient of Doctor Nelda Marseille. The d/c will be taken to [facility] ([facility]) this pm for signature.  On 02/26/17 I received the d/c back from Doctor Nelda Marseille. I got the d/c ready and called the funeral home to let them know the d/c is ready for pickup and I also faxed a copy to the funeral home per the funeral home request.

## 2017-02-26 LAB — CULTURE, BLOOD (ROUTINE X 2)
CULTURE: NO GROWTH
Culture: NO GROWTH
SPECIAL REQUESTS: ADEQUATE
SPECIAL REQUESTS: ADEQUATE

## 2017-03-04 ENCOUNTER — Other Ambulatory Visit: Payer: Medicare Other

## 2017-03-04 ENCOUNTER — Ambulatory Visit: Payer: Medicare Other | Admitting: Hematology and Oncology

## 2017-03-04 ENCOUNTER — Ambulatory Visit: Payer: Medicare Other

## 2017-03-07 NOTE — Progress Notes (Signed)
Patient pronounced at 1222. Daughter and son present and at bedside. Dr. Nelda Marseille notified. [facility] called, spoke with Synetta Shadow and given referral number [phone]. This RN listened for heartbeat for one full minute and Arlyss Repress verified.

## 2017-03-07 NOTE — Progress Notes (Signed)
Daily Progress Note   Patient Name: Tonya Alvarez       Date: 10-Mar-2017 DOB: 28-Apr-1946  Age: 71 y.o. MRN#: [id number] Attending Physician: Rush Farmer, MD Primary Care Physician: Jacelyn Pi, MD Admit Date: 03/03/2017  Reason for Consultation/Follow-up: Terminal Care  Subjective:  Patient to be taken off BIPAP and levophed soon Family holding vigil at bedside Patient awake but not alert She is in resp distress Not verbal See below:   Length of Stay: 2  Current Medications: Scheduled Meds:    Continuous Infusions: . morphine 10 mg/hr (2017-03-10 1001)    PRN Meds: LORazepam, morphine injection, morphine, prochlorperazine, sodium chloride flush  Physical Exam         Chronically ill appearing lady, currently on BIPAP Awake, eyes open, but she is not alert Coarse crackles S1 S2 Distant Edema Abdomen distended  Vital Signs: BP (!) 102/52   Pulse 95   Temp 98.8 F (37.1 C)   Resp 14   Ht 5\' 9"  (1.753 m)   Wt (!) 142.7 kg (314 lb 9.5 oz)   SpO2 91%   BMI 46.46 kg/m  SpO2: SpO2: 91 % O2 Device: O2 Device: Bi-PAP O2 Flow Rate: O2 Flow Rate (L/min): 2 L/min  Intake/output summary:  Intake/Output Summary (Last 24 hours) at 03/10/17 1049 Last data filed at 10-Mar-2017 0800  Gross per 24 hour  Intake             1484 ml  Output              555 ml  Net              929 ml   LBM: Last BM Date: 02/11/2017 Baseline Weight: Weight: (!) 142.7 kg (314 lb 9.5 oz) Most recent weight: Weight:  (bed malfunction)      PPS 10% Palliative Assessment/Data:      Patient Active Problem List   Diagnosis Date Noted  . Advance care planning   . Palliative care by specialist   . Dyspnea 02/05/2017  . Ascites 02/10/2017  . Pancytopenia due to chemotherapy ([place])  02/08/2017  . AKI (acute kidney injury) ([place]) 02/20/2017  . Acute respiratory failure with hypoxia ([place]) 03/04/2017  . Hepatic failure ([place]) 02/18/2017  . Goals of care, counseling/discussion 02/04/2017  . Genetic testing 11/15/2016  .  Pulmonary embolism ([place]) 10/22/2015  . PE (pulmonary embolism) 10/22/2015  . Diabetes mellitus without complication ([place])   . Port catheter in place 09/09/2015  . Bone metastases ([place]) 04/12/2015  . Hyperkalemia 04/02/2014  . Hypertension 04/02/2014  . 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   Goals of Care and Additional Recommendations:  Limitations on Scope of Treatment: Full Comfort Care  Code Status:    Code Status Orders        Start     Ordered   2017-02-27 0902  DNR (Do not attempt resuscitation)  Continuous    Question Answer Comment  In the event of cardiac or respiratory ARREST Do not call a "code blue"   In the event of cardiac or respiratory ARREST Do not perform Intubation, CPR, defibrillation or ACLS   In the event of cardiac or respiratory ARREST Use medication by any route, position, wound care, and other measures to relive pain and suffering. May use oxygen, suction and manual treatment of airway obstruction as needed for comfort.      Feb 27, 2017 0901    Code Status History    Date Active Date Inactive Code Status Order ID Comments User Context   02/22/2017  7:57 PM 02/27/2017  9:01 AM DNR [id number]  Chesley Mires, MD Inpatient   03/05/2017 11:04 AM 02/22/2017  7:57 PM Full Code [id number]  Phillips Grout, MD  Inpatient   10/22/2015  7:56 PM 10/26/2015  8:23 PM Full Code [id number]  Ivor Costa, MD ED   09/22/2014  1:20 PM 09/23/2014  2:35 PM Full Code [id number]  Coralie Keens, MD Inpatient   02/25/2014 10:22 AM 02/26/2014  3:32 AM Full Code [id number]  Sandi Mariscal, MD [place]   02/23/2014  2:45 PM 02/24/2014  3:32 AM Full Code [id number]  Arne Cleveland, MD HOV       Prognosis:   Hours - Days  Discharge Planning:  Anticipated Hospital Death  Care plan was discussed with  Son, daughter, Dr Nelda Marseille, RN Shanon Brow.   Thank you for allowing the Palliative Medicine Team to assist in the care of this patient.   Time In:  9 Time Out: 9.35 Total Time 35 Prolonged Time Billed  no       Greater than 50%  of this time was spent counseling and coordinating care related to the above assessment and plan.  Loistine Chance, MD [phone]  Please contact Palliative Medicine Team phone at [phone] for questions and concerns.

## 2017-03-07 NOTE — Progress Notes (Signed)
Pt's daughter, Colletta Maryland, was bedside when I arrived. She talked about her mother's journey w/cancer and how it quickly became aggressive. 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 Colletta Maryland said her father just passed in December and her parents had been married 40+ years. She said that loss is still fresh as they now have to say goodbye to their mom. Colletta Maryland shared the strong faith of her mother and how her mother reached up as her BiPAP was removed. Colletta Maryland felt she was reaching toward heaven and she became tearful as she described that event. Colletta Maryland said their pastors, from [facility], had visited yesterday. She also said her brother is a Theme park manager in [place]. Colletta Maryland and I had prayer and a few moments later her brother joined [name]. He too talked about the strong faith of his mother and how his parents were very supportive of his journey in ministry. Please page if additional assistance is needed.  The same was offered to the family. [place], MDiv   03/25/2017 1100  Clinical Encounter Type  Visited With Family

## 2017-03-07 NOTE — Discharge Summary (Signed)
NAME:  Tonya Alvarez, Tonya Alvarez NO.:  [id number]  MEDICAL RECORD NO.:  [id number]  LOCATION:  XRAY                         FACILITY:  [facility]  PHYSICIAN:  Providence Lanius, MD  DATE OF BIRTH:  23-Mar-1946  DATE OF ADMISSION:  02/25/2017 DATE OF DISCHARGE:                              DISCHARGE SUMMARY   DEATH SUMMARY  PRIMARY DIAGNOSIS/CAUSE OF DEATH:  Stage IV breast cancer.  SECONDARY DIAGNOSES:  Acute respiratory failure, pulmonary edema, anasarca, circulatory shock, hyperlipidemia, mitral regurgitation, acute kidney injury, chronic anticoagulation, pulmonary embolus, anemia secondary to chemotherapy, diabetes mellitus, and chronic pain associated with cancer.  HOSPITAL COURSE:  The patient is a 71 year old female with history of metastatic breast cancer, presents to the hospital with anasarca and acute pulmonary edema with respiratory failure, was found to have hypovolemic shock as well or circulatory shock as well.  The patient elected not to be intubated at that time and requested for more time. On the day of October, 20, the patient was evaluated, had significant altered mental status with hypercarbia.  I had a conversation with the family and informed at this point that the patient will be __________ given the fact that the patient has advanced breast cancer that is stage IV with multiple mets.  The daughter informs that the patient would not want such prolonged dying process and elected to go with morphine, at which point, morphine was started.  BiPAP was discontinued, so the patient expired shortly thereafter with the family at bedside __________.     Providence Lanius, MD     WJY/MEDQ  D:  02/27/2017  T:  02/27/2017  Job:  [id number]

## 2017-03-07 NOTE — Progress Notes (Signed)
PULMONARY / CRITICAL CARE MEDICINE   Name: Tonya Alvarez MRN: [id number] DOB: 04-12-1946    ADMISSION DATE:  02/15/2017 CONSULTATION DATE:  02/13/2017  REFERRING MD:  Dr. Shanon Brow   CHIEF COMPLAINT:  SOB   HISTORY OF PRESENT ILLNESS:   71 y/o F who presented to [facility] on 10/18 with increasing shortness of breath.    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  She was initially diagnosed in 2015.  The patient was seen on 10/15 by her Oncologist (Dr. Lindi Adie).  The week prior to that visit she required 4 units of PRBC's for anemia.  Her labs on 10/15 were notable for CO2 21, Alk Phos 851, albumin 1.7, AST 389, ALT 139.  Dr. Geralyn Flash Notes reflect that he was treating through her elevated LFTs but was considering discontinuing and recommending hospice.  She presented to [facility] ER on 10/18 with worsening shortness of breath.  Symptoms increased on 10/16.  She reports difficulty taking a deep breath as it hurts her abdomen.  In addition to SOB she had increased diffuse swelling.  She reports swelling has increased over the past week.  The patient states she ordinarily does not have pain. She denies fevers, chills, nausea, vomiting. No cough or sputum production.  She has had some diarrhea The patient was noted to be hypotensive on presentation to the ER.she was given 1 L normal saline with improvement in blood pressure.  Chest x-ray was negative for acute infiltrate.  Initial labs-NA 135, K4.1, chloride 104, CO2 17, glucose 109, BUN 63, creatinine 1.86, anion gap 14, alkaline phosphatase 725, albumin 1.7, AST 660, ALT 161, WBC 1.9, hemoglobin and platelets 60.    PCCM consulted for evaluation of hypotension.   SUBJECTIVE: Deteriorated overnight, pressor demand increasing and mental status deteriorated, family wishes for DNR and was made DNR overnight.  VITAL  SIGNS: BP (!) 93/47   Pulse 95   Temp 98.4 F (36.9 C)   Resp (!) 23   Ht '5\' 9"'$  (1.753 m)   Wt (!) 142.7 kg (314 lb 9.5 oz)   SpO2 94%   BMI 46.46 kg/m   HEMODYNAMICS:    VENTILATOR SETTINGS:    INTAKE / OUTPUT: I/O last 3 completed shifts: In: 2783.9 [I.V.:1338.9; Blood:345; IV Piggyback:1100] Out: 1095 [Urine:1095]  PHYSICAL EXAMINATION: General: Chronically ill appearing, respiratory distress noted HEENT: /AT, PERRL, EOM-I and DMM PSY: Barely responsive at this point Neuro: Barely responsive, moving ext to pain but not command CV: RRR, Nl S1/S2, -M/R/G. PULM: Diffuse crackles GI: Soft, NT, distended and MMM Extremities: Warm and dry Skin: no rashes or lesions  LABS:  BMET  Recent Labs Lab 02/18/17 1433 02/04/2017 0640 02/22/17 0439  NA 138 135 135  K 4.2 4.1 4.5  CL  --  104 106  CO2 21* 17* 16*  BUN 34.8* 63* 74*  CREATININE 1.4* 1.86* 1.86*  GLUCOSE 283* 109* 169*    Electrolytes  Recent Labs Lab 02/18/17 1433 02/25/2017 0640 02/22/17 0439  CALCIUM 7.9* 6.9* 6.6*  MG  --   --  2.5*  PHOS  --   --  6.9*    CBC  Recent Labs Lab 02/19/2017 0640 02/22/17 0439 02/22/17 1753 02/22/17 1826  WBC 1.9* 1.2*  --  1.0*  HGB 7.9* 6.6* 6.3* 6.2*  HCT 24.0* 19.3* 18.7* 18.4*  PLT 60* 40*  --  31*    Coag's  Recent Labs Lab 02/22/17 1826  INR 2.41    Sepsis Markers  Recent Labs Lab 02/26/2017 1213 02/05/2017 1600  LATICACIDVEN 1.6 1.4  PROCALCITON 10.37  --     ABG  Recent Labs Lab 02/12/2017 1225 02/22/17 0525  PHART 7.337* 7.289*  PCO2ART 31.3* 32.3  PO2ART 126* 129*    Liver Enzymes  Recent Labs Lab 02/18/17 1433 02/11/2017 0640 02/22/17 0439  AST 389* 660* 493*  ALT 139* 161* 131*  ALKPHOS 851* 725* 625*  BILITOT 6.68* 9.4* 10.1*  ALBUMIN 1.7* 1.7* 2.2*    Cardiac Enzymes No results for input(s): TROPONINI, PROBNP in the last 168 hours.  Glucose  Recent Labs Lab 02/22/17 0735 02/22/17 1153 02/22/17 1626  02/22/17 1942 03-19-2017 0042 03/19/2017 0341  GLUCAP 173* 195* 239* 234* 234* 227*    Imaging Dg Chest Port 1 View  Result Date: 03-19-17 CLINICAL DATA:  Acute respiratory failure EXAM: PORTABLE CHEST 1 VIEW COMPARISON:  02/22/2017 FINDINGS: Cardiac shadow is mildly enlarged. Left-sided PICC line and right-sided chest wall port are again seen and stable in appearance. The lungs are well aerated bilaterally. Mild central vascular congestion is noted. No interstitial edema or focal infiltrate is noted. Postsurgical changes are seen. IMPRESSION: Mild vascular congestion. Tubes and lines in stable position. Electronically Signed   By: Inez Catalina M.D.   On: March 19, 2017 06:42   Dg Chest Port 1 View  Result Date: 02/22/2017 CLINICAL DATA:  Left PICC placement. EXAM: PORTABLE CHEST 1 VIEW COMPARISON:  02/22/2017 . FINDINGS: Patient is rotated. Left PICC line noted with tip projected superior vena cava. Right IJ line noted with tip projected over superior vena cava. Heart size normal. Low lung volumes. Mild left base atelectasis. No pleural effusion or pneumothorax . Surgical clips noted over the right axilla . Degenerative changes thoracic spine . IMPRESSION: 1. Left PICC line noted with tip over superior vena cava. Right IJ line stable position. 2.  Mild left base atelectasis. Electronically Signed   By: Marcello Moores  Register   On: 02/22/2017 10:06   STUDIES:    CULTURES: BCx2 10/18 >>  UA 10/18 >>   ANTIBIOTICS: Cefepime 10/18 >>  Linezolid 10/18 >>    SIGNIFICANT EVENTS: 10/18  Admit with SOB 10/19  Hgb decreased, 1 unit PRBC.  Lasix gtt increased to 10 mg/hr  LINES/TUBES: R CW Port 10/18 >>   DISCUSSION: 71 y/o F with stage IV metastatic breast cancer admitted with hypotension, AKI, worsening LFT's, dyspnea and anasarca.  Concern for progression of malignancy vs new infectious process.   ASSESSMENT / PLAN:  PULMONARY A: Acute Respiratory Distress - in setting of metastatic breast  cancer  Elevated D-Dimer, Hx of PE  P:   Once family is ready, start morphine and d/c BiPAP  CARDIOVASCULAR A:  Shock - volume depletion vs infectious etiology vs malignant progression  Hx HTN, HLD, MR P:  D/C tele D/C levophed  RENAL A:   AKI  AGMA P:   D/C blood draws  GASTROINTESTINAL A:   Liver Metastasis  P:   D/C TF  HEMATOLOGIC / ONC  A:   Chronic Anticoagulation - in setting of prior PE  Stage IV Breast Cancer Anemia - recent transfusion of 4 units PRBC's P:  D/C blood draws  INFECTIOUS A:   Shock - rule out sepsis / infectious etiology  P:   D/C abx  ENDOCRINE A:   DM    P:   D/C CBGs and  SSI  NEUROLOGIC A:   Cancer Associated Pain  P:   Morphine for comfort  FAMILY  - Updates: Patient updated at bedside.  See below  - Inter-disciplinary family meet or Palliative Care meeting due by:  See prior iPAL note.  Full code. Patient desires ~ 48 hours aggressive support if necessary.    Patient clearly decompensated overnight, spoke with family, they are ready for withdrawal.  Awaiting son's arrival.  Once here will begin morphine drip and remove BiPAP and levophed and allow to pass in peace.  The patient is critically ill with multiple organ systems failure and requires high complexity decision making for assessment and support, frequent evaluation and titration of therapies, application of advanced monitoring technologies and extensive interpretation of multiple databases.   Critical Care Time devoted to patient care services described in this note is  35  Minutes. This time reflects time of care of this signee Dr Jennet Maduro. This critical care time does not reflect procedure time, or teaching time or supervisory time of PA/NP/Med student/Med Resident etc but could involve care discussion time.  Rush Farmer, M.D. [facility] Pulmonary/Critical Care Medicine. Pager: [phone]. After hours pager: [phone].

## 2017-03-07 NOTE — Progress Notes (Signed)
BIPAP removed by MD. Family at bedside.

## 2017-03-07 NOTE — Progress Notes (Signed)
Spoke with pt daughter, informed her that pt LOC was changing.  Informed her that pt has labored breathing, also not as responsive and I am not sure how long pt would be responsive.  Daughter states that they would coming this am.

## 2017-03-07 DEATH — deceased

## 2017-03-11 ENCOUNTER — Ambulatory Visit: Payer: Medicare Other

## 2017-04-10 ENCOUNTER — Other Ambulatory Visit: Payer: Self-pay | Admitting: Nurse Practitioner

## 2018-12-30 IMAGING — CT CT ABD-PELV W/ CM
2 of 5 series · 15 of 46 positions shown, 17 images · IV contrast (APPLIED)
Comparison: CT 04/09/2016, PET-CT scan 08/08/2015

CLINICAL DATA: Breast cancer restaging.

EXAM:
CT CHEST, ABDOMEN, AND PELVIS WITH CONTRAST
TECHNIQUE: Multidetector CT imaging of the chest, abdomen and pelvis was
performed following the standard protocol during bolus
administration of intravenous contrast.
CONTRAST:  100 mL Omnipaque

[Series 2: cap with · axial · 0.96mm/px · z∈[-656,-121]mm · 12 of 127 slices shown, 14 images]
[im 10/127  soft-tissue]
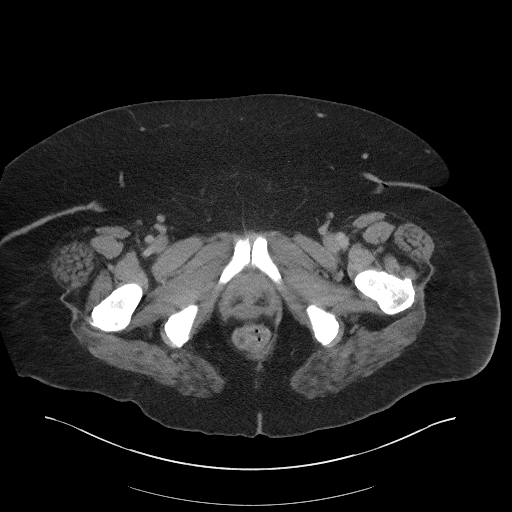
[im 10/127  bone]
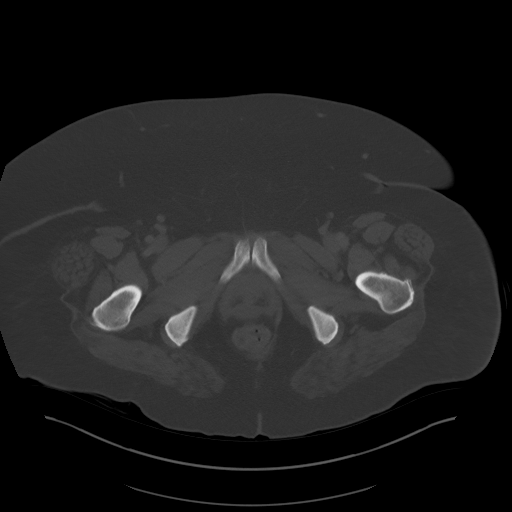
[im 20/127  soft-tissue]
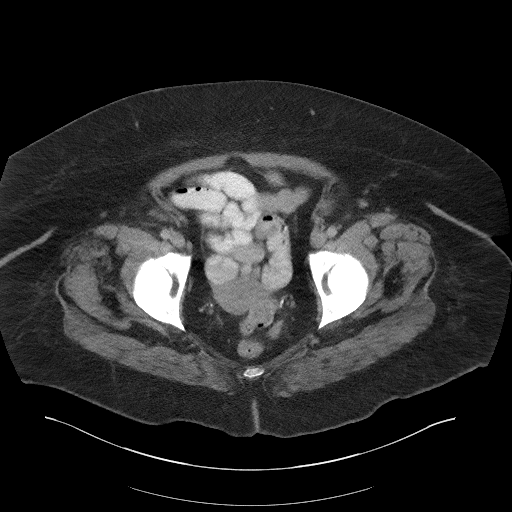
[im 30/127  soft-tissue]
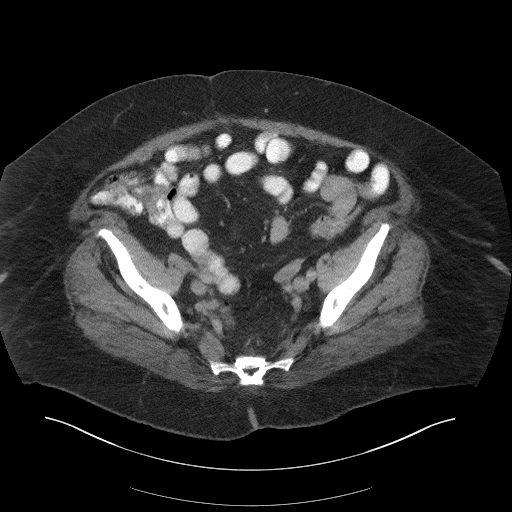
[im 39/127  soft-tissue]
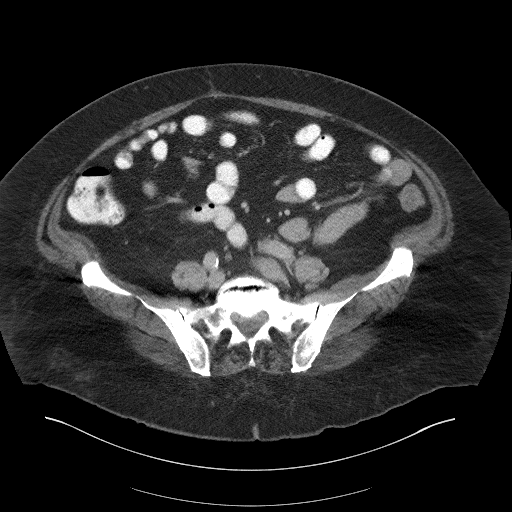
[im 49/127  soft-tissue]
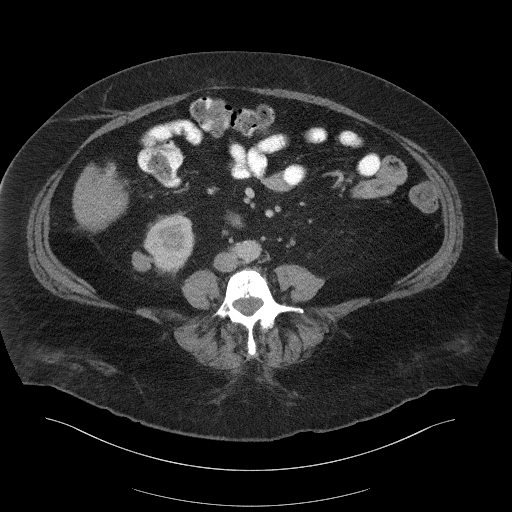
[im 59/127  soft-tissue]
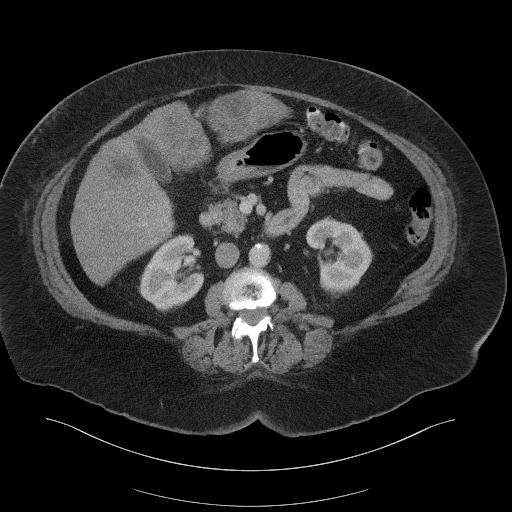
[im 68/127  soft-tissue]
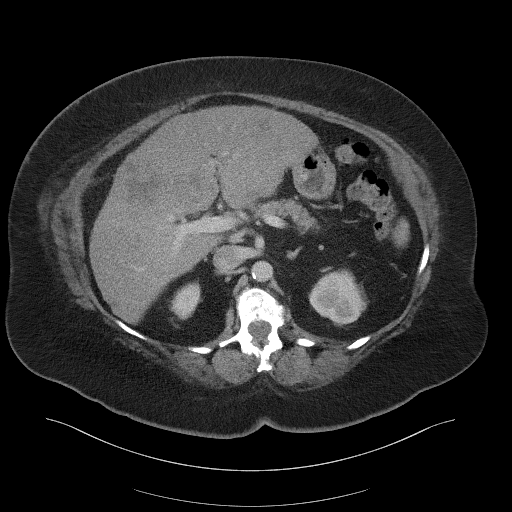
[im 78/127  soft-tissue]
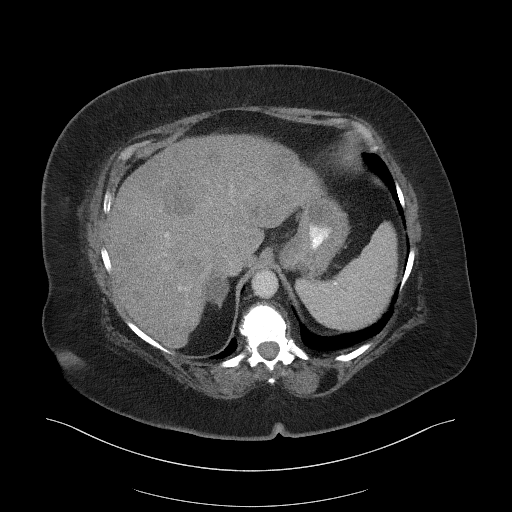
[im 88/127  soft-tissue]
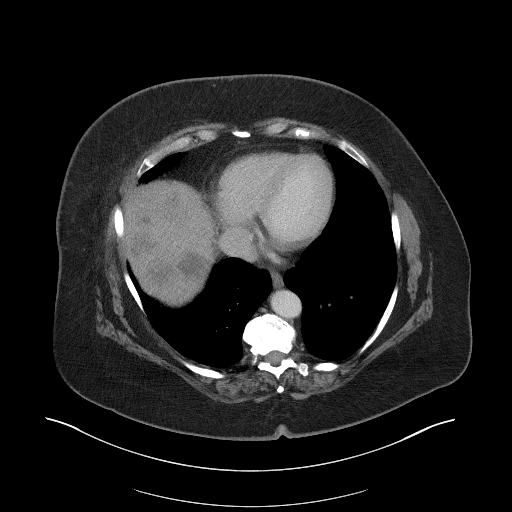
[im 88/127  bone]
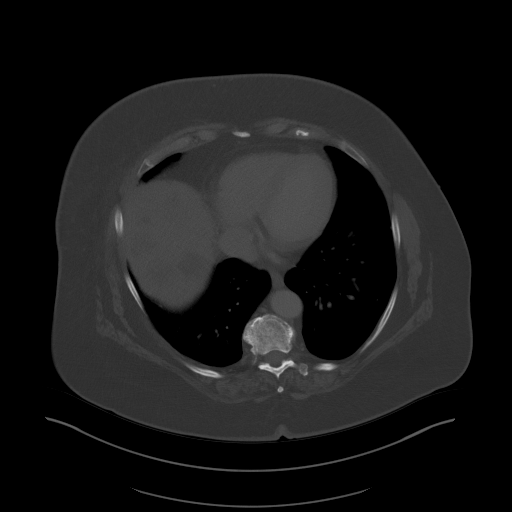
[im 97/127  soft-tissue]
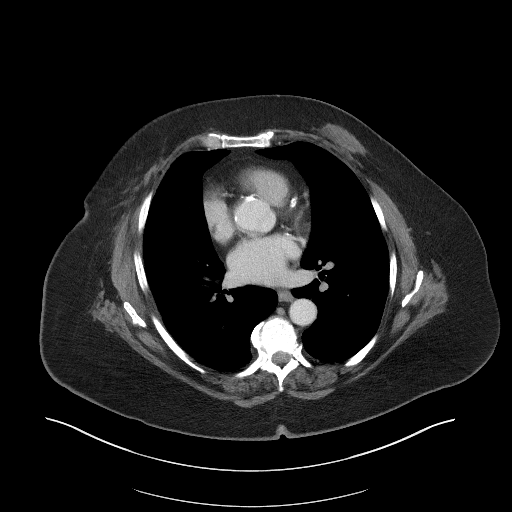
[im 107/127  soft-tissue]
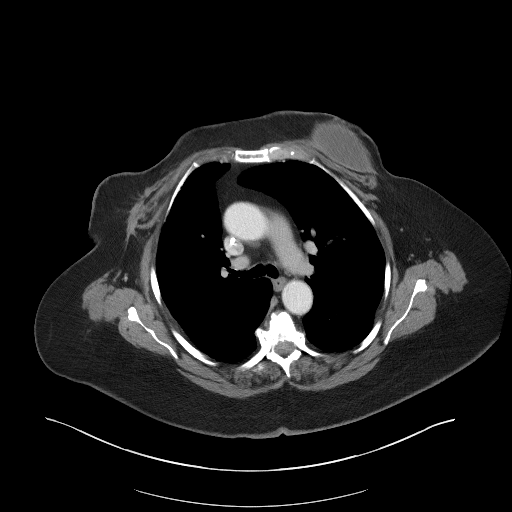
[im 117/127  soft-tissue]
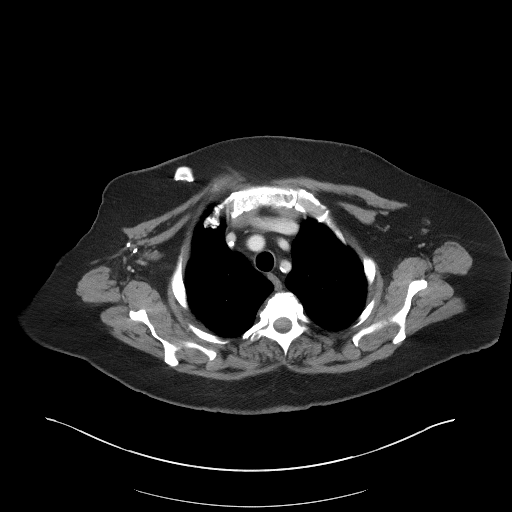

[Series 5: coronals · coronal · 0.90mm/px · 3 of 171 slices shown]
[im 57/171  soft-tissue]
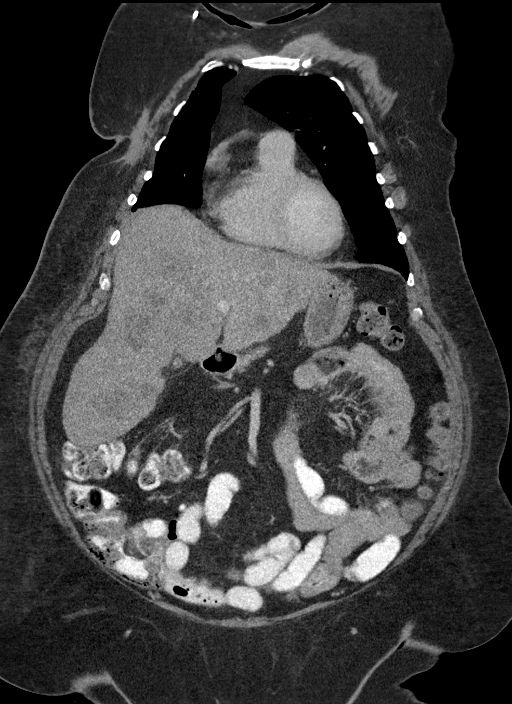
[im 76/171  soft-tissue]
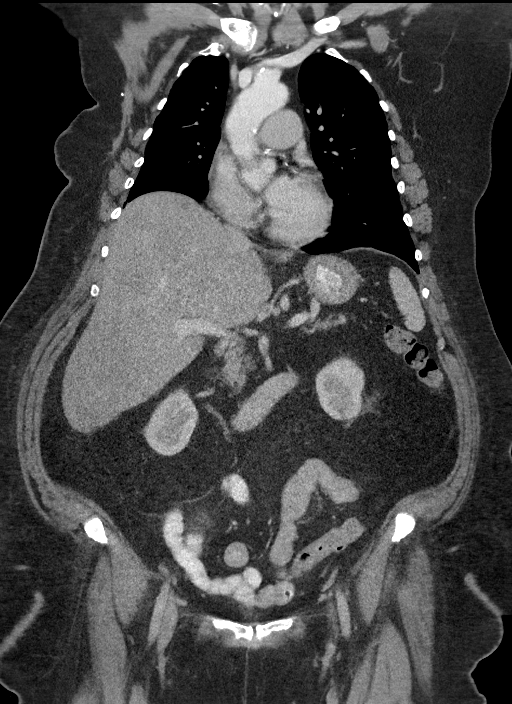
[im 95/171  soft-tissue]
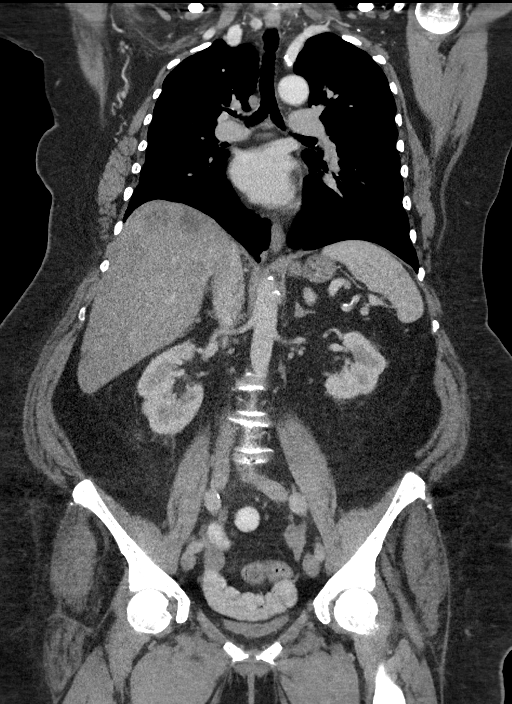

[15 of 46 positions shown; findings below may reference images not displayed]

FINDINGS: CT CHEST FINDINGS

Cardiovascular: Coronary artery calcification and aortic
atherosclerotic calcification.

Mediastinum/Nodes: Port in the RIGHT chest wall. RIGHT axillary
nodal dissection with linear scarring. Post RIGHT mastectomy.
Subglandular implant post LEFT mastectomy.

No supraclavicular adenopathy. Nodules within thyroid gland
unchanged. No internal mammary adenopathy. No mediastinal hilar
adenopathy.

Lungs/Pleura: 4 mm nodule at the RIGHT lung base not seen on
comparison exam (image 25, series 4). No additional new pulmonary
nodules identified.

Musculoskeletal: No aggressive osseous lesion.

CT ABDOMEN AND PELVIS FINDINGS

Hepatobiliary: New low-density lesions within the LEFT and RIGHT
hepatic lobes. Example lesion in segment 4B measures approximately
4.3 cm (image 65, series 2). Example lesion in the posterior RIGHT
hepatic lobe (segment 7) measures 3.7 cm (image 46, series 2).
approximately 8 lesions per lobe.

Gallbladder normal.

Pancreas: Pancreas is normal. No ductal dilatation. No pancreatic
inflammation.

Spleen: Normal spleen

Adrenals/urinary tract: RIGHT adrenal gland is enlarged to 30 mm
unchanged from prior. Lesion demonstrates low Hounsfield units
consistent benign adenoma.

The kidneys are unchanged. Nodule lesion adjacent to the lower pole
of the RIGHT kidney measures 20 mm compared with 20 mm on prior.
This lesion is not hypermetabolic comparison PET-CT scan.

Ureters and bladder normal.

Stomach/Bowel: Stomach, small bowel, appendix, and cecum are normal.
The colon and rectosigmoid colon are normal.

Vascular/Lymphatic: Abdominal aorta is normal caliber with
atherosclerotic calcification. There is no retroperitoneal or
periportal lymphadenopathy. No pelvic lymphadenopathy.

Reproductive: Uterus and ovaries normal

Other: No peritoneal metastasis

Musculoskeletal: The sclerotic lesions within the sacrum and spine
are unchanged.
IMPRESSION: Chest Impression:

1. Single new small pulmonary nodule in the RIGHT lower lobe.
Recommend attention on follow-up.

Abdomen / Pelvis Impression:

1. Unfortunately, new HEPATIC METASTASIS within LEFT and RIGHT
hepatic lobe.
2. Stable sclerotic metastasis within the pelvis and spine.
